# Patient Record
Sex: Female | Born: 1965
Health system: Southern US, Community
[De-identification: ages and names within clinical notes are randomized; demographics above are authoritative.]

## PROBLEM LIST (undated history)

## (undated) DIAGNOSIS — K219 Gastro-esophageal reflux disease without esophagitis: Secondary | ICD-10-CM

## (undated) DIAGNOSIS — N201 Calculus of ureter: Secondary | ICD-10-CM

## (undated) DIAGNOSIS — F1011 Alcohol abuse, in remission: Secondary | ICD-10-CM

## (undated) DIAGNOSIS — J449 Chronic obstructive pulmonary disease, unspecified: Secondary | ICD-10-CM

## (undated) DIAGNOSIS — E210 Primary hyperparathyroidism: Secondary | ICD-10-CM

## (undated) DIAGNOSIS — Z915 Personal history of self-harm: Secondary | ICD-10-CM

## (undated) DIAGNOSIS — F209 Schizophrenia, unspecified: Secondary | ICD-10-CM

## (undated) DIAGNOSIS — K449 Diaphragmatic hernia without obstruction or gangrene: Secondary | ICD-10-CM

## (undated) DIAGNOSIS — F4310 Post-traumatic stress disorder, unspecified: Secondary | ICD-10-CM

## (undated) DIAGNOSIS — F319 Bipolar disorder, unspecified: Secondary | ICD-10-CM

## (undated) DIAGNOSIS — Z87442 Personal history of urinary calculi: Secondary | ICD-10-CM

## (undated) DIAGNOSIS — Z8619 Personal history of other infectious and parasitic diseases: Secondary | ICD-10-CM

## (undated) DIAGNOSIS — L2989 Other pruritus: Secondary | ICD-10-CM

## (undated) DIAGNOSIS — E785 Hyperlipidemia, unspecified: Secondary | ICD-10-CM

## (undated) DIAGNOSIS — E559 Vitamin D deficiency, unspecified: Secondary | ICD-10-CM

## (undated) DIAGNOSIS — F1910 Other psychoactive substance abuse, uncomplicated: Secondary | ICD-10-CM

## (undated) DIAGNOSIS — F32A Depression, unspecified: Secondary | ICD-10-CM

## (undated) DIAGNOSIS — F603 Borderline personality disorder: Secondary | ICD-10-CM

## (undated) DIAGNOSIS — L298 Other pruritus: Secondary | ICD-10-CM

## (undated) DIAGNOSIS — F1911 Other psychoactive substance abuse, in remission: Secondary | ICD-10-CM

## (undated) DIAGNOSIS — Z9151 Personal history of suicidal behavior: Secondary | ICD-10-CM

## (undated) HISTORY — PX: TUBAL LIGATION: SHX77

## (undated) HISTORY — DX: Other psychoactive substance abuse, uncomplicated: F19.10

## (undated) HISTORY — PX: HERNIA REPAIR: SHX51

## (undated) HISTORY — DX: Hyperlipidemia, unspecified: E78.5

---

## 2002-03-13 HISTORY — PX: SPLENECTOMY: SUR1306

## 2011-05-16 ENCOUNTER — Emergency Department (HOSPITAL_COMMUNITY)
Admission: EM | Admit: 2011-05-16 | Discharge: 2011-05-16 | Discharge status: Against Medical Advice | Payer: Self-pay | Attending: Emergency Medicine | Admitting: Emergency Medicine

## 2011-05-16 ENCOUNTER — Encounter (HOSPITAL_COMMUNITY): Payer: Self-pay | Admitting: Family Medicine

## 2011-05-16 DIAGNOSIS — K429 Umbilical hernia without obstruction or gangrene: Secondary | ICD-10-CM | POA: Insufficient documentation

## 2011-05-16 NOTE — ED Notes (Signed)
Per EMS: Pt was getting out of her car approx 1.5 hours ago and peri umbilical hernia "popped" out. Rates pain 10/10.

## 2011-05-16 NOTE — ED Notes (Signed)
Patient came to nurses'station and stated that she was better. States that her hernia went back in by itself. States that she had been laying on the stretcher with her knees bent and after she started to relax, the hernia went back in. States that she no longer needs to see the physician because "I am not in pain any more and the hernia is back in." Encouraged patient to stay for evaluation but patient declined. Told patient to return to ED if needed; patient verbalized understanding.

## 2011-05-16 NOTE — ED Notes (Signed)
ZOX:[id number]<AV> Expected date:05/16/11<BR> Expected time: 6:57 PM<BR> Means of arrival:Ambulance<BR> Comments:<BR> EMS 25 GC- 46 y/o female with abdominal pain. History of unbilical Hernia. Pain in that area. Vitals wnl.

## 2011-06-02 ENCOUNTER — Encounter (HOSPITAL_COMMUNITY): Payer: Self-pay | Admitting: Emergency Medicine

## 2011-06-02 ENCOUNTER — Emergency Department (HOSPITAL_COMMUNITY)
Admission: EM | Admit: 2011-06-02 | Discharge: 2011-06-03 | Disposition: A | Discharge status: Other Acute Inpatient Hospital | Payer: Self-pay | Attending: Emergency Medicine | Admitting: Emergency Medicine

## 2011-06-02 DIAGNOSIS — F1490 Cocaine use, unspecified, uncomplicated: Secondary | ICD-10-CM

## 2011-06-02 DIAGNOSIS — A499 Bacterial infection, unspecified: Secondary | ICD-10-CM | POA: Insufficient documentation

## 2011-06-02 DIAGNOSIS — B9689 Other specified bacterial agents as the cause of diseases classified elsewhere: Secondary | ICD-10-CM | POA: Insufficient documentation

## 2011-06-02 DIAGNOSIS — H11419 Vascular abnormalities of conjunctiva, unspecified eye: Secondary | ICD-10-CM | POA: Insufficient documentation

## 2011-06-02 DIAGNOSIS — Z8619 Personal history of other infectious and parasitic diseases: Secondary | ICD-10-CM | POA: Insufficient documentation

## 2011-06-02 DIAGNOSIS — N760 Acute vaginitis: Secondary | ICD-10-CM | POA: Insufficient documentation

## 2011-06-02 DIAGNOSIS — R4182 Altered mental status, unspecified: Secondary | ICD-10-CM | POA: Insufficient documentation

## 2011-06-02 DIAGNOSIS — F1410 Cocaine abuse, uncomplicated: Secondary | ICD-10-CM | POA: Insufficient documentation

## 2011-06-02 DIAGNOSIS — R404 Transient alteration of awareness: Secondary | ICD-10-CM | POA: Insufficient documentation

## 2011-06-02 DIAGNOSIS — R45851 Suicidal ideations: Secondary | ICD-10-CM | POA: Insufficient documentation

## 2011-06-02 LAB — COMPREHENSIVE METABOLIC PANEL
ALT: 12 U/L (ref 0–35)
Albumin: 3.9 g/dL (ref 3.5–5.2)
Alkaline Phosphatase: 85 U/L (ref 39–117)
BUN: 10 mg/dL (ref 6–23)
Potassium: 3.6 mEq/L (ref 3.5–5.1)
Sodium: 135 mEq/L (ref 135–145)
Total Protein: 7.2 g/dL (ref 6.0–8.3)

## 2011-06-02 LAB — CBC
MCHC: 34.9 g/dL (ref 30.0–36.0)
RDW: 14 % (ref 11.5–15.5)

## 2011-06-02 LAB — ETHANOL: Alcohol, Ethyl (B): <11 mg/dL (ref 0–11)

## 2011-06-02 NOTE — ED Notes (Addendum)
Per Pt and friend: chronic cocaine, marijuana, etoh, heroine user. Presents to ED with feelings of wanting to kill self but denies plan. Poor historian. Restless, cooperative, in no acute distress. Reports itching in vagina and non-compliance with prescription meds for vaginal infection.

## 2011-06-02 NOTE — ED Notes (Signed)
Pt has 2 bags.  One of clothing and the other a purse.

## 2011-06-03 ENCOUNTER — Inpatient Hospital Stay (HOSPITAL_COMMUNITY)
Admission: RE | Admit: 2011-06-03 | Discharge: 2011-06-07 | DRG: 897 | Disposition: A | Discharge status: Home / Self Care | Payer: PRIVATE HEALTH INSURANCE | Source: Ambulatory Visit | Attending: Psychiatry | Admitting: Psychiatry

## 2011-06-03 DIAGNOSIS — F1420 Cocaine dependence, uncomplicated: Principal | ICD-10-CM

## 2011-06-03 DIAGNOSIS — F17200 Nicotine dependence, unspecified, uncomplicated: Secondary | ICD-10-CM

## 2011-06-03 DIAGNOSIS — A499 Bacterial infection, unspecified: Secondary | ICD-10-CM

## 2011-06-03 DIAGNOSIS — R45851 Suicidal ideations: Secondary | ICD-10-CM

## 2011-06-03 DIAGNOSIS — N760 Acute vaginitis: Secondary | ICD-10-CM

## 2011-06-03 DIAGNOSIS — Z79899 Other long term (current) drug therapy: Secondary | ICD-10-CM

## 2011-06-03 DIAGNOSIS — B9689 Other specified bacterial agents as the cause of diseases classified elsewhere: Secondary | ICD-10-CM | POA: Diagnosis present

## 2011-06-03 DIAGNOSIS — B1920 Unspecified viral hepatitis C without hepatic coma: Secondary | ICD-10-CM

## 2011-06-03 DIAGNOSIS — F1910 Other psychoactive substance abuse, uncomplicated: Secondary | ICD-10-CM

## 2011-06-03 DIAGNOSIS — F1220 Cannabis dependence, uncomplicated: Secondary | ICD-10-CM

## 2011-06-03 DIAGNOSIS — F609 Personality disorder, unspecified: Secondary | ICD-10-CM

## 2011-06-03 DIAGNOSIS — F39 Unspecified mood [affective] disorder: Secondary | ICD-10-CM

## 2011-06-03 DIAGNOSIS — F1994 Other psychoactive substance use, unspecified with psychoactive substance-induced mood disorder: Secondary | ICD-10-CM

## 2011-06-03 LAB — WET PREP, GENITAL: Trich, Wet Prep: NONE SEEN

## 2011-06-03 LAB — URINALYSIS, ROUTINE W REFLEX MICROSCOPIC
Hgb urine dipstick: NEGATIVE
Ketones, ur: 15 mg/dL — AB
Nitrite: NEGATIVE
pH: 6 (ref 5.0–8.0)

## 2011-06-03 LAB — RAPID URINE DRUG SCREEN, HOSP PERFORMED
Barbiturates: NOT DETECTED
Benzodiazepines: NOT DETECTED
Cocaine: POSITIVE — AB
Opiates: NOT DETECTED

## 2011-06-03 MED ORDER — ONDANSETRON HCL 4 MG PO TABS: 4.0000 mg | ORAL_TABLET | Freq: Three times a day (TID) | ORAL | Status: DC | PRN

## 2011-06-03 MED ORDER — NICOTINE 21 MG/24HR TD PT24
21.0000 mg | MEDICATED_PATCH | Freq: Every day | TRANSDERMAL | Status: DC
  Filled 2011-06-03 (×5): qty 1

## 2011-06-03 MED ORDER — METRONIDAZOLE 500 MG PO TABS
500.0000 mg | ORAL_TABLET | Freq: Once | ORAL | Status: AC
  Administered 2011-06-03: 500 mg via ORAL
  Filled 2011-06-03: qty 1

## 2011-06-03 MED ORDER — MICONAZOLE NITRATE 2 % VA CREA
1.0000 | TOPICAL_CREAM | Freq: Every day | VAGINAL | Status: DC
  Administered 2011-06-03 – 2011-06-04 (×2): 1 via VAGINAL
  Filled 2011-06-03 (×3): qty 45

## 2011-06-03 MED ORDER — ACETAMINOPHEN 325 MG PO TABS: 650.0000 mg | ORAL_TABLET | ORAL | Status: DC | PRN

## 2011-06-03 MED ORDER — TRAZODONE HCL 50 MG PO TABS
50.0000 mg | ORAL_TABLET | Freq: Once | ORAL | Status: DC
  Filled 2011-06-03 (×2): qty 1

## 2011-06-03 MED ORDER — LORAZEPAM 1 MG PO TABS
1.0000 mg | ORAL_TABLET | Freq: Once | ORAL | Status: DC
  Filled 2011-06-03 (×2): qty 1

## 2011-06-03 MED ORDER — ACETAMINOPHEN 325 MG PO TABS
650.0000 mg | ORAL_TABLET | Freq: Four times a day (QID) | ORAL | Status: DC | PRN
  Administered 2011-06-06: 650 mg via ORAL

## 2011-06-03 MED ORDER — LORAZEPAM 1 MG PO TABS: 1.0000 mg | ORAL_TABLET | Freq: Three times a day (TID) | ORAL | Status: DC | PRN

## 2011-06-03 MED ORDER — ALUM & MAG HYDROXIDE-SIMETH 200-200-20 MG/5ML PO SUSP: 30.0000 mL | ORAL | Status: DC | PRN

## 2011-06-03 MED ORDER — DIPHENHYDRAMINE HCL 50 MG PO CAPS
50.0000 mg | ORAL_CAPSULE | Freq: Once | ORAL | Status: AC
  Administered 2011-06-04: 50 mg via ORAL
  Filled 2011-06-03: qty 1

## 2011-06-03 MED ORDER — HALOPERIDOL 2 MG PO TABS
2.0000 mg | ORAL_TABLET | Freq: Once | ORAL | Status: DC
  Filled 2011-06-03: qty 2
  Filled 2011-06-03: qty 1

## 2011-06-03 MED ORDER — MAGNESIUM HYDROXIDE 400 MG/5ML PO SUSP: 30.0000 mL | Freq: Every day | ORAL | Status: DC | PRN

## 2011-06-03 MED ORDER — ALUM & MAG HYDROXIDE-SIMETH 200-200-20 MG/5ML PO SUSP
30.0000 mL | ORAL | Status: DC | PRN
Start: 2011-06-03 — End: 2011-06-03
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
## 2011-06-03 NOTE — ED Notes (Signed)
Pt accepted to [facility] 303-1. Report called to [place], RN at [facility]. Accepting MD Mazzei. Pt informed. Pt is pleasant, cooperative and in no distress. NAD noted.

## 2011-06-03 NOTE — ED Notes (Signed)
Received report

## 2011-06-03 NOTE — BH Assessment (Signed)
Assessment Note   Albesa Seen, LSCW requested Pt be reviewed by MD on call for admission. Relayed clinical information to Verne Spurr, PA who accepted Pt to [facility]. Consulted with Binnie Rail, [facility] who confirmed bed availability: 303-1. Communicated information to Georgina Quint, Ecologist at [employer].   Patsy Baltimore, Harlin Rain 06/03/2011 8:28 PM

## 2011-06-03 NOTE — ED Provider Notes (Addendum)
History     CSN: [id number]  Arrival date & time 06/02/11  2126   First MD Initiated Contact with Patient 06/03/11 0250      Chief Complaint  Patient presents with  . Suicidal    (Consider location/radiation/quality/duration/timing/severity/associated sxs/prior treatment) HPI Level 5 Caveat: altered mental status The patient is somnolent and cannot stay awake long enough to answer a single question.  Patient will be reevaluated when she's able to converse.  Past Medical History  Diagnosis Date  . Hernia   . Hepatitis C   . Drug abuse and dependence     Past Surgical History  Procedure Date  . Splenectomy   . Tubal ligation     History reviewed. No pertinent family history.  History  Substance Use Topics  . Smoking status: Current Everyday Smoker -- 1.0 packs/day  . Smokeless tobacco: Not on file  . Alcohol Use: No    OB History    Grav Para Term Preterm Abortions TAB SAB Ect Mult Living                  Review of Systems  All other systems reviewed and are negative.    Allergies  Review of patient's allergies indicates no known allergies.  Home Medications   Current Outpatient Rx  Name Route Sig Dispense Refill  . METRONIDAZOLE 500 MG PO TABS Oral Take 500 mg by mouth 2 (two) times daily.      BP 114/55  Pulse 84  Temp(Src) 98.3 F (36.8 C) (Oral)  Resp 20  SpO2 95%  LMP 05/05/2011  Physical Exam General: Well-developed, well-nourished female in no acute distress; appearance consistent with age of record HENT: normocephalic, atraumatic Eyes: Conjunctiva; injection; pupils equal round and reactive to light Neck: supple Heart: regular rate and rhythm Lungs: clear to auscultation bilaterally Abdomen: soft; nondistended Extremities: No deformity Neurologic: Somnolent; motor function intact in all extremities and symmetric; no facial droop Skin: Warm and dry Psychiatric: Unable to perform exam at this time    ED Course  Procedures  (including critical care time)     MDM   Nursing notes and vitals signs, including pulse oximetry, reviewed.  Summary of this visit's results, reviewed by myself:  Labs:  Results for orders placed during the hospital encounter of 06/02/11  CBC      Component Value Range   WBC 10.5  4.0 - 10.5 (K/uL)   RBC 3.98  3.87 - 5.11 (MIL/uL)   Hemoglobin 12.5  12.0 - 15.0 (g/dL)   HCT 64.4 (*) 03.4 - 46.0 (%)   MCV 89.9  78.0 - 100.0 (fL)   MCH 31.4  26.0 - 34.0 (pg)   MCHC 34.9  30.0 - 36.0 (g/dL)   RDW 74.2  59.5 - 63.8 (%)   Platelets 364  150 - 400 (K/uL)  COMPREHENSIVE METABOLIC PANEL      Component Value Range   Sodium 135  135 - 145 (mEq/L)   Potassium 3.6  3.5 - 5.1 (mEq/L)   Chloride 102  96 - 112 (mEq/L)   CO2 26  19 - 32 (mEq/L)   Glucose, Bld 93  70 - 99 (mg/dL)   BUN 10  6 - 23 (mg/dL)   Creatinine, Ser 7.56  0.50 - 1.10 (mg/dL)   Calcium 43.3  8.4 - 10.5 (mg/dL)   Total Protein 7.2  6.0 - 8.3 (g/dL)   Albumin 3.9  3.5 - 5.2 (g/dL)   AST 16  0 - 37 (U/L)  ALT 12  0 - 35 (U/L)   Alkaline Phosphatase 85  39 - 117 (U/L)   Total Bilirubin 0.4  0.3 - 1.2 (mg/dL)   GFR calc non Af Amer >90  >90 (mL/min)   GFR calc Af Amer >90  >90 (mL/min)  ETHANOL      Component Value Range   Alcohol, Ethyl (B) <11  0 - 11 (mg/dL)  ACETAMINOPHEN LEVEL      Component Value Range   Acetaminophen (Tylenol), Serum <15.0  10 - 30 (ug/mL)  URINE RAPID DRUG SCREEN (HOSP PERFORMED)      Component Value Range   Opiates NONE DETECTED  NONE DETECTED    Cocaine POSITIVE (*) NONE DETECTED    Benzodiazepines NONE DETECTED  NONE DETECTED    Amphetamines NONE DETECTED  NONE DETECTED    Tetrahydrocannabinol POSITIVE (*) NONE DETECTED    Barbiturates NONE DETECTED  NONE DETECTED   URINALYSIS, ROUTINE W REFLEX MICROSCOPIC      Component Value Range   Color, Urine YELLOW  YELLOW    APPearance CLEAR  CLEAR    Specific Gravity, Urine 1.036 (*) 1.005 - 1.030    pH 6.0  5.0 - 8.0    Glucose, UA  NEGATIVE  NEGATIVE (mg/dL)   Hgb urine dipstick NEGATIVE  NEGATIVE    Bilirubin Urine SMALL (*) NEGATIVE    Ketones, ur 15 (*) NEGATIVE (mg/dL)   Protein, ur NEGATIVE  NEGATIVE (mg/dL)   Urobilinogen, UA 1.0  0.0 - 1.0 (mg/dL)   Nitrite NEGATIVE  NEGATIVE    Leukocytes, UA NEGATIVE  NEGATIVE   POCT PREGNANCY, URINE      Component Value Range   Preg Test, Ur NEGATIVE  NEGATIVE    7:17 AM The patient is now awake and able to give a history. She states that she's had a 30 year history of crack cocaine abuse. She just did a four-day binge of crack cocaine, hence her somnolence earlier. She is tearful and admits to suicidal ideation. She also complains of a vaginal discharge; she was recently treated for this but did not complete her course of Flagyl.  PE: GU: Normal external genitalia; GERD-like vaginal discharge; no vaginal bleeding; no cervical motion tenderness; no adnexal tenderness; no adnexal mass.  7:30 AM The patient will be evaluated by ACT. Dr. Karma Ganja will follow up on wet prep, the results of which are pending.          Hanley Seamen, MD 06/03/11 1610  Hanley Seamen, MD 06/03/11 [phone]

## 2011-06-03 NOTE — ED Notes (Addendum)
Pt. Resting on stretcher, pt. arousable  but very sleepy

## 2011-06-03 NOTE — ED Notes (Signed)
Pt has been accepted to [facility] by Shelda Jakes, PA to attending Dr Koren Shiver, rm 303-1. EDP was notified and agrees with plan. Pt has been notified and support paperwork has been gathered and faxed to [facility].

## 2011-06-03 NOTE — ED Notes (Signed)
Patient is resting comfortably. 

## 2011-06-03 NOTE — ED Provider Notes (Signed)
Pt currently awaiting assessment by ACT team, wet prep obtained by Dr. Read Drivers show many clue cells- flagyl ordered for BV.    Ethelda Chick, MD 06/03/11 [phone]

## 2011-06-03 NOTE — ED Notes (Signed)
2 bags of belongings in locker 26.

## 2011-06-03 NOTE — ED Notes (Signed)
Pt asleep, breakfast tray ordered.

## 2011-06-03 NOTE — ED Notes (Signed)
Report called to Wendy, RN

## 2011-06-03 NOTE — ED Notes (Signed)
Pt has been accepted to [facility] 303-1. Report called to [facility] RN at [facility]. Receiving MD will be [facility]. Pt is in no distress at this time, VS stable. Awaitng disposition from Dr. Adriana Simas to d/c pt to [facility].

## 2011-06-04 DIAGNOSIS — F1910 Other psychoactive substance abuse, uncomplicated: Secondary | ICD-10-CM

## 2011-06-04 DIAGNOSIS — A499 Bacterial infection, unspecified: Secondary | ICD-10-CM

## 2011-06-04 DIAGNOSIS — N760 Acute vaginitis: Secondary | ICD-10-CM

## 2011-06-04 DIAGNOSIS — B9689 Other specified bacterial agents as the cause of diseases classified elsewhere: Secondary | ICD-10-CM | POA: Diagnosis present

## 2011-06-04 LAB — URINALYSIS, ROUTINE W REFLEX MICROSCOPIC
Bilirubin Urine: NEGATIVE
Hgb urine dipstick: NEGATIVE
Ketones, ur: NEGATIVE mg/dL
Protein, ur: NEGATIVE mg/dL
Urobilinogen, UA: 0.2 mg/dL (ref 0.0–1.0)

## 2011-06-04 MED ORDER — METRONIDAZOLE 500 MG PO TABS
500.0000 mg | ORAL_TABLET | Freq: Two times a day (BID) | ORAL | Status: DC
  Administered 2011-06-04 – 2011-06-07 (×7): 500 mg via ORAL
  Filled 2011-06-04 (×10): qty 1

## 2011-06-04 NOTE — Progress Notes (Signed)
Patient ID: Mary [name], female   DOB: 1965/04/07, 46 y.o.   MRN: [id number]  [facility] Notes:  (Counselor/Nursing/MHT/Case Management/Adjunct)  06/04/2011 1:15 PM  Type of Therapy:  Group Therapy, Dance/Movement Therapy   Participation Level:  Did Not Attend   [place], [place]

## 2011-06-04 NOTE — Progress Notes (Signed)
[facility] Adult Inpatient Family/Significant Other Suicide Prevention Education  Suicide Prevention Education:  Contact Attempts: Providence Crosby (daughter) [phone], (name of family member/significant other) has been identified by the patient as the family member/significant other with whom the patient will be residing, and identified as the person(s) who will aid the patient in the event of a mental health crisis.  With written consent from the patient, two attempts were made to provide suicide prevention education, prior to and/or following the patient's discharge.  We were unsuccessful in providing suicide prevention education.  A suicide education pamphlet was given to the patient to share with family/significant other.  Date and time of first attempt: 06/04/11 at 2:48 PM was not able to leave voice message Date and time of second attempt:  [facility] 06/04/2011, 2:48 PM

## 2011-06-04 NOTE — H&P (Signed)
Patient was seen for suicidal assessment and case discussed with Ms. Adams and agree with admission and treatment plan.
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
## 2011-06-04 NOTE — H&P (Signed)
Psychiatric Admission Assessment Adult  Patient Identification:  Mary [name] Date of Evaluation:  06/04/2011 46 yo SAAF CC: Suicidal  History of Present Illness:: Presented to ED after a 4 day binge of crack cocaine- has abused for 30 years.She was tearful and reported SI with a plan to walk into traffic.She had hallucinations when high.   Past Psychiatric History: Says last place she received treatment was [facility]  Prozac Saphris and Zyprexa help her stay clean.   Substance Abuse History:  Social History:    reports that she has been smoking.  She does not have any smokeless tobacco history on file. She reports that she uses illicit drugs (Cocaine). She reports that she does not drink alcohol.  Family Psych History: Denies Mental illness   Past Medical History:     Past Medical History  Diagnosis Date  . Hernia   . Hepatitis C   . Drug abuse and dependence        Past Surgical History  Procedure Date  . Splenectomy   . Tubal ligation     Allergies: No Known Allergies  Current Medications:  Prior to Admission medications   Medication Sig Start Date End Date Taking? Authorizing Provider  metroNIDAZOLE (FLAGYL) 500 MG tablet Take 500 mg by mouth 2 (two) times daily.    Historical Provider, MD    Mental Status Examination/Evaluation: Objective:  Appearance: Disheveled  Psychomotor Activity:  Normal  Eye Contact::  Good  Speech:  Normal Rate  Volume:  Normal  Mood: concerned about her health    Affect:  Congruent  Thought Process: clear rational goal oriented - get off crack     Orientation:  Full  Thought Content:  No AVH or psychosis   Suicidal Thoughts:  No  Homicidal Thoughts:  No  Judgement:  Fair  Insight:  Fair    DIAGNOSIS:    AXIS I Substance Induced Mood Disorder  AXIS II Deferred  AXIS III See medical history.  AXIS IV economic problems, housing problems, occupational problems, problems related to social environment and  problems with primary support group  AXIS V 31-40 impairment in reality testing when high      Treatment Plan Summary: Admit for safety & stabilization Support with medication as indicated for detox from crack. Treat BV and r/o STD's Get med history from [facility] and initiate treatment as indicated.   Agree with H&P from ED

## 2011-06-04 NOTE — Progress Notes (Signed)
Patient ID: Mary [name], female   DOB: May 19, 1965, 46 y.o.   MRN: [id number] Pt. denies lethality and A/H's, but admits to "seeing these black spots in front of my eyes: but I'm not crazy."  Pt. Has an inappropriate laugh and smiles constantly with a flirtatious affect especially in front of peers. No c/o pain or problems except those related to her mouth rash (she is on flagyl for her yeast infection and uses a cream at HS). Pt. remains on the COWS protocal: no tremors or other obvious problems noted.

## 2011-06-04 NOTE — Tx Team (Signed)
Initial Interdisciplinary Treatment Plan  PATIENT STRENGTHS: (choose at least two) Average or above average intelligence  PATIENT STRESSORS: Financial difficulties Medication change or noncompliance Substance abuse   PROBLEM LIST: Problem List/Patient Goals Date to be addressed Date deferred Reason deferred Estimated date of resolution  Substance Abuse 06/03/11     Suicidal 06/03/11                                                DISCHARGE CRITERIA:  Ability to meet basic life and health needs Improved stabilization in mood, thinking, and/or behavior Verbal commitment to aftercare and medication compliance  PRELIMINARY DISCHARGE PLAN: Attend 12-step recovery group  PATIENT/FAMIILY INVOLVEMENT: This treatment plan has been presented to and reviewed with the patient, Cris Gibby  .  Binnie Rail [facility] 06/04/2011, 2:16 AM
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
## 2011-06-04 NOTE — Progress Notes (Signed)
Patient ID: Mary [name], female   DOB: 1965-12-25, 46 y.o.   MRN: [id number] Pt is asleep in bed this AM. Pt denies SI/HI and AVH. Pt c/o withdrawal symptoms and discomfort r/t detox. Pt states that she cannot participate due to her symptoms. Pt is also c/o vaginal discomfort. PA notified. Pt is withdrawn and forwards little. Writer will continue to monitor.
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
## 2011-06-05 LAB — RPR: RPR Ser Ql: NONREACTIVE

## 2011-06-05 LAB — GC/CHLAMYDIA PROBE AMP, GENITAL
Chlamydia, DNA Probe: NEGATIVE
GC Probe Amp, Genital: NEGATIVE

## 2011-06-05 MED ORDER — DIPHENHYDRAMINE HCL 25 MG PO CAPS
50.0000 mg | ORAL_CAPSULE | Freq: Four times a day (QID) | ORAL | Status: DC | PRN
  Administered 2011-06-05 – 2011-06-06 (×4): 50 mg via ORAL
  Filled 2011-06-05: qty 2

## 2011-06-05 MED ORDER — DIPHENHYDRAMINE-ZINC ACETATE 2-0.1 % EX CREA
TOPICAL_CREAM | Freq: Every day | CUTANEOUS | Status: DC | PRN
  Administered 2011-06-05 – 2011-06-06 (×3): via TOPICAL
  Filled 2011-06-05: qty 28.4

## 2011-06-05 MED ORDER — DIPHENHYDRAMINE HCL 50 MG/ML IJ SOLN: 50.0000 mg | Freq: Four times a day (QID) | INTRAMUSCULAR | Status: DC | PRN

## 2011-06-05 MED ORDER — TUBERCULIN PPD 5 UNIT/0.1ML ID SOLN
5.0000 [IU] | Freq: Once | INTRADERMAL | Status: AC
  Administered 2011-06-06: 5 [IU] via INTRADERMAL

## 2011-06-05 NOTE — Discharge Planning (Signed)
Mary [name] did not come to AM group.  Found her in bed this afternoon.  She was awake and engageable.  States she wants to go into a half-way house from here as she cannot return to stay with her daughter.  Revealed she has no income, and has no confidence about working.  Informed her of [facility] program in [place]  She was willing to fill out application.  I left it with her.  Stated she was at [facility] for rehab in the distant past.

## 2011-06-05 NOTE — Progress Notes (Addendum)
[facility] Group Notes:  (Counselor/Nursing/MHT/Case Management/Adjunct)  06/05/2011 3:36 PM   Type of Therapy:  Processing Group at 11:00 am  Participation Level:  Minimal  Participation Quality:  Attendance  Affect:  Distracted and preoccupied  Cognitive:  Unknown  Insight: Unknown  Engagement in Group:  Minimal  Engagement in Therapy:  Minimal  Modes of Intervention:  Socialization and Support  Summary of Progress/Problems:  Mary [name] came in for about one third of group processing session;.did not share when prompted and left before 15 minutes passed.  Patient may have been responding to internal stimuli and appeared to f;irt with facilitator and one other person in group  [facility] Group Notes:  (Counselor/Nursing/MHT/Case Management/Adjunct)  06/05/2011 3:36 PM   Type of Therapy:  Counseling Group at 1:15 pm  Participation Level:  Did Not Attend   Ronda Fairly, LCSWA 06/05/2011 3:36 PM

## 2011-06-05 NOTE — Progress Notes (Signed)
[facility] MD Progress Note  06/05/2011 4:36 PM  Diagnosis:  Substance abuse  ADL's:  Intact  Sleep: Poor  Appetite:  Poor  Suicidal Ideation:  denies Homicidal Ideation:  denies  AEB (as evidenced by):  Mental Status Examination/Evaluation: Objective:  Appearance: Disheveled  Eye Contact::  Fair  Speech:  Clear and Coherent  Volume:  Normal  Mood:  Euthymic  Affect:  Appropriate  Thought Process:  Linear  Orientation:  Full  Thought Content:  WDL  Suicidal Thoughts:  No  Homicidal Thoughts:  No  Memory:  Immediate;   Fair  Judgement:  Fair  Insight:  Lacking  Psychomotor Activity:  Normal  Concentration:  Fair  Recall:  Fair  Akathisia:  No  Handed:    AIMS (if indicated):     Assets:  Desire for Improvement  Sleep:  Number of Hours: 5.5    Vital Signs:Blood pressure 117/79, pulse 70, temperature 97.6 F (36.4 C), temperature source Oral, resp. rate 17, height 5\' 4"  (1.626 m), weight 68.04 kg (150 lb), last menstrual period 05/05/2011. Current Medications: Current Facility-Administered Medications  Medication Dose Route Frequency Provider Last Rate Last Dose  . acetaminophen (TYLENOL) tablet 650 mg  650 mg Oral Q6H PRN Verne Spurr, PA-C      . alum & mag hydroxide-simeth (MAALOX/MYLANTA) 200-200-20 MG/5ML suspension 30 mL  30 mL Oral Q4H PRN Verne Spurr, PA-C      . diphenhydrAMINE (BENADRYL) capsule 50 mg  50 mg Oral Once Verne Spurr, PA-C   50 mg at 06/04/11 2252  . diphenhydrAMINE (BENADRYL) capsule 50 mg  50 mg Oral Q6H PRN Verne Spurr, PA-C   50 mg at 06/05/11 1121   Or  . diphenhydrAMINE (BENADRYL) injection 50 mg  50 mg Intramuscular Q6H PRN Verne Spurr, PA-C      . diphenhydrAMINE-zinc acetate (BENADRYL) 2-0.1 % cream   Topical Daily PRN Verne Spurr, PA-C      . haloperidol (HALDOL) tablet 2 mg  2 mg Oral Once [employer], PA-C      . LORazepam (ATIVAN) tablet 1 mg  1 mg Oral Once [employer], PA-C      . magnesium hydroxide (MILK OF MAGNESIA)  suspension 30 mL  30 mL Oral Daily PRN Verne Spurr, PA-C      . metroNIDAZOLE (FLAGYL) tablet 500 mg  500 mg Oral BID [name] Adams, PA   500 mg at 06/05/11 0803  . miconazole (MONISTAT 7) 2 % vaginal cream 1 Applicatorful  1 Applicatorful Vaginal QHS Verne Spurr, PA-C   1 Applicatorful at 06/04/11 2200  . nicotine (NICODERM CQ - dosed in mg/24 hours) patch 21 mg  21 mg Transdermal Q0600 Verne Spurr, PA-C      . ondansetron [facility]) tablet 4 mg  4 mg Oral Q8H PRN Verne Spurr, PA-C      . traZODone (DESYREL) tablet 50 mg  50 mg Oral Once Verne Spurr, PA-C        Lab Results:  Results for orders placed during the hospital encounter of 06/03/11 (from the past 48 hour(s))  URINALYSIS, ROUTINE W REFLEX MICROSCOPIC     Status: Abnormal   Collection Time   06/04/11  7:00 PM      Component Value Range Comment   Color, Urine YELLOW  YELLOW     APPearance CLOUDY (*) CLEAR     Specific Gravity, Urine 1.018  1.005 - 1.030     pH 7.5  5.0 - 8.0     Glucose,  UA NEGATIVE  NEGATIVE (mg/dL)    Hgb urine dipstick NEGATIVE  NEGATIVE     Bilirubin Urine NEGATIVE  NEGATIVE     Ketones, ur NEGATIVE  NEGATIVE (mg/dL)    Protein, ur NEGATIVE  NEGATIVE (mg/dL)    Urobilinogen, UA 0.2  0.0 - 1.0 (mg/dL)    Nitrite NEGATIVE  NEGATIVE     Leukocytes, UA SMALL (*) NEGATIVE    URINE MICROSCOPIC-ADD ON     Status: Abnormal   Collection Time   06/04/11  7:00 PM      Component Value Range Comment   Squamous Epithelial / LPF FEW (*) RARE     WBC, UA 0-2  <3 (WBC/hpf)    Bacteria, UA FEW (*) RARE    RPR     Status: Normal   Collection Time   06/04/11  7:37 PM      Component Value Range Comment   RPR NON REACTIVE  NON REACTIVE      Physical Findings: AIMS:  , ,  ,  ,    CIWA:    COWS:     Treatment Plan Summary: Daily contact with patient to assess and evaluate symptoms and progress in treatment Medication management  Plan:  Mary [name] 06/05/2011, 4:36 PM

## 2011-06-05 NOTE — Progress Notes (Signed)
Patient ID: Mary [name], female   DOB: December 27, 1965, 46 y.o.   MRN: [id number]  Patient was pleasant and cooperative during the assessment, but anxious. Stated that she didn't feel well, and her skin continues to itch.  Pt relates this to crack use. Writer encouraged pt to take a bath tonight and apply cream to her body. Pt seemed slightly agitated, because staff had to interrupt her in order for them to do environmental. Support and encouragement was offered.

## 2011-06-05 NOTE — Progress Notes (Signed)
Patient ID: Mary [name], female   DOB: 09-Oct-1965, 46 y.o.   MRN: [id number] Pt. denies lethality and A/V/H's, but continues to have problems with her anxiety.  Pt. also showed staff and N. Mashburn, PA her Rt. Arm AC with a large red circular area.  Pt. was started on Benadryl. Benadryl cream applied when available. Pt. is not interacting with nursing staff, but did interact in groups.

## 2011-06-06 MED ORDER — OLANZAPINE 5 MG PO TABS
5.0000 mg | ORAL_TABLET | Freq: Every day | ORAL | Status: DC
  Administered 2011-06-06: 5 mg via ORAL
  Filled 2011-06-06 (×3): qty 1

## 2011-06-06 MED ORDER — FLUOXETINE HCL 20 MG PO CAPS
20.0000 mg | ORAL_CAPSULE | Freq: Every day | ORAL | Status: DC
  Administered 2011-06-06 – 2011-06-07 (×2): 20 mg via ORAL
  Filled 2011-06-06 (×4): qty 1

## 2011-06-06 NOTE — Progress Notes (Signed)
[facility] MD Progress Note  06/06/2011 3:51 PM  S/O: Patient seen and evaluated. Chart reviewed. Patient stated that her mood was "ok". Her affect was mood congruent, yet anxious. She denied any current thoughts of self injurious behavior, suicidal ideation or homicidal ideation. There were no auditory or visual hallucinations, paranoia, delusional thought processes, or true mania noted.  Thought process was linear and goal directed.  No psychomotor agitation or retardation was noted. Speech was normal rate, tone and volume. Eye contact was good. Judgment and insight are fair.  Patient has been up and engaged on the unit.  No acute safety concerns reported from team.  Discharge to [facility] scheduled for tomorrow, yet changed meds today and pt to be seen by MD for SRA prior to final discharge in am.   Sleep:  Number of Hours: 6.75    Vital Signs:Blood pressure 101/70, pulse 78, temperature 97.7 F (36.5 C), temperature source Oral, resp. rate 19, height 5\' 4"  (1.626 m), weight 68.04 kg (150 lb), last menstrual period 05/05/2011.  Lab Results:  Results for orders placed during the hospital encounter of 06/03/11 (from the past 48 hour(s))  URINALYSIS, ROUTINE W REFLEX MICROSCOPIC     Status: Abnormal   Collection Time   06/04/11  7:00 PM      Component Value Range Comment   Color, Urine YELLOW  YELLOW     APPearance CLOUDY (*) CLEAR     Specific Gravity, Urine 1.018  1.005 - 1.030     pH 7.5  5.0 - 8.0     Glucose, UA NEGATIVE  NEGATIVE (mg/dL)    Hgb urine dipstick NEGATIVE  NEGATIVE     Bilirubin Urine NEGATIVE  NEGATIVE     Ketones, ur NEGATIVE  NEGATIVE (mg/dL)    Protein, ur NEGATIVE  NEGATIVE (mg/dL)    Urobilinogen, UA 0.2  0.0 - 1.0 (mg/dL)    Nitrite NEGATIVE  NEGATIVE     Leukocytes, UA SMALL (*) NEGATIVE    URINE MICROSCOPIC-ADD ON     Status: Abnormal   Collection Time   06/04/11  7:00 PM      Component Value Range Comment   Squamous Epithelial / LPF FEW (*) RARE     WBC, UA 0-2  <3  (WBC/hpf)    Bacteria, UA FEW (*) RARE    RPR     Status: Normal   Collection Time   06/04/11  7:37 PM      Component Value Range Comment   RPR NON REACTIVE  NON REACTIVE      A/P: Cocaine and Cannabis Dependence; Mood Disorder NOS; r/o SIMD; r/o PD NOS with Cluster B Traits  Pt stated that she did best in the past via [facility] with Prozac and Zyprexa.  Asked to d/c Trazodone and Haldol 2/2 increased sedation and "reverse effects".  Pt c/o continued irritability, mood lability, anxiety and difficulty sleeping.  Medication education completed.  Pros, cons, risks, potential side effects and benefits (including no treatment) were discussed with pt.  Pt agreeable with the plan.  See orders.  Discussed with team.  Discharge to [facility] tomorrow pending no adverse rxn to restart of meds per pt request.  Lupe Carney 06/06/2011, 3:51 PM

## 2011-06-06 NOTE — Progress Notes (Signed)
Lying quietly in bed with eyes closed.  No signs of acute withdrawal symptoms.  Q 15 minute safety checks are in progress to maintain safety.

## 2011-06-06 NOTE — Progress Notes (Addendum)
[facility] Adult Inpatient Family/Significant Other Suicide Prevention Education  Suicide Prevention Education:  Family/Significant Other Refusal to Support Patient after Discharge:  Suicide Prevention Education Not Provided:  Patient plans to discharge tomorrow 06/07/11 to [facility] treatment center.  With written consent of the patient, two attempts were made to provide Suicide Prevention Education to daughter, Clarise Cruz at [phone].  This person indicates he/she will not be responsible for the patient after discharge.  Writer provided suicide prevention education directly to patient; conversation included risk factors, warning signs and resources to contact for help. Mobile crisis services explained and Suicide prevention information pamphlet with all contact numbers placed in chart for pt to receive at discharge.   Clide Dales 06/06/2011,6:06 PM

## 2011-06-06 NOTE — Progress Notes (Signed)
Patient very restless and complaining of menstrual cramps and itching all over this morning.  She had difficulty participating in groups this morning due to irritability and restlessness.  Patient rates depression at a 7 and states on her self-inventory that she has "off and on" thoughts of suicide without any plan, but does contract for safety.  Tylenol and Benadryl (po and cream) given with good relief of itching and cramps.  Patient stated that she felt "much better" this afternoon and was up and interacting with staff and other patients.  PPD placed on right forearm at 0856 this morning, will need to be read 06/08/11 at 0856.  Patient plans to discharge to [facility] once stabilized.

## 2011-06-06 NOTE — Treatment Plan (Signed)
Interdisciplinary Treatment Plan Update (Adult)  Date: 06/06/2011  Time Reviewed: 8:17 AM   Progress in Treatment: Attending groups: Yes Participating in groups: Yes Taking medication as prescribed: Yes Tolerating medication: Yes   Family/Significant othe contact made: Yes   Patient understands diagnosis:  Yes  As evidenced by asking for help with substance abuse Discussing patient identified problems/goals with staff:  Yes  See below Medical problems stabilized or resolved:  Yes Denies suicidal/homicidal ideation: Yes  With Dr and CM Issues/concerns per patient self-inventory:  None noted Other:  New problem(s) identified: N/A  Reason for Continuation of Hospitalization: Anxiety Depression  Interventions implemented related to continuation of hospitalization:   Additional comments: Application has been sent to [facility] and [name] has a phone interview this afternoon.  If that goes well, she will likely d/c tomorrow  Estimated length of stay: 1-2 days  Discharge Plan: Transfer to [facility] program  New goal(s): N/A  Review of initial/current patient goals per problem list:   1.  Goal(s): Eliminate SI  Met:  Yes  Target date:3/26  As evidenced ZO:XWRU report in mtg with CM, Dr  2.  Goal (s):Identify comprehensive sobriety plan  Met:  No  Target date: 3/27  As evidenced EA:VWUJ report  3.  Goal(s):Address itching  Met:  No  Target date:3/27  As evidenced WJ:[id number] gone  Right now [name] is getting benadryl and cream..  She dislikes benedryl as it makes her drowsy.  4.  Goal(s):  Met:  No  Target date:  As evidenced by:  Attendees: Patient:  Mary [name] 06/06/2011 8:17 AM  Family:     Physician:  Lupe Carney 06/06/2011 8:17 AM   Nursing:    06/06/2011 8:17 AM   Case Manager:  Richelle Ito, LCSW 06/06/2011 8:17 AM   Counselor:   06/06/2011 8:17 AM   Other:   06/06/2011 8:17 AM  Other:     Other:     Other:      Scribe for Treatment Team:   Ida Rogue, 06/06/2011 8:17 AM

## 2011-06-06 NOTE — Progress Notes (Signed)
Patient ID: Mary [name], female   DOB: 1965-05-24, 46 y.o.   MRN: [id number]  Patient was pleasant and cooperative during the assessment. Informed the writer that she would be discharge tomorrow to [facility]. "I've been trying to get into that place for a year".  Stated that the program finds apartments, and pays their bills for a year allowing them time to get a job. Support and encouragement was offered.

## 2011-06-06 NOTE — Progress Notes (Signed)
[facility] Case Management Discharge Plan:  Will you be returning to the same living situation after discharge: No. At discharge, do you have transportation home?:Yes,  [facility] Do you have the ability to pay for your medications: Yes, No meds  Interagency Information:     Release of information consent forms completed and in the chart;  Patient's signature needed at discharge.  Patient to Follow up at:  Follow-up Information    Follow up with [facility] on 06/07/2011. (Pt will be discharginf to the [facility] program from the hospital on 06/07/11.)    Contact information:   [address] [place] [address] [phone]         Patient denies SI/HI:   Yes,  yes    Safety Planning and Suicide Prevention discussed:  Yes,  yes  Barrier to discharge identified:No.  Summary and Recommendations:   Mary [name] 06/06/2011, 3:51 PM

## 2011-06-06 NOTE — Discharge Planning (Signed)
Patient attended AM group with some encouragement [found her in bed, dressed, awake].  Complaining of bad itching, and excused self to get cream and benadryl.  Returned.  C/O sedation due to benadryl.Mary [name] had filled out application for [facility] and I FAXed it to [place] for review.  Mary [name] became tearful in group, talking about how insecurities and fears get in the way of sobriety.  Revealed she did well for 18 mos while in housing and working.  [clean and sober]

## 2011-06-06 NOTE — Progress Notes (Signed)
[facility] Group Notes:  (Counselor/Nursing/MHT/Case Management/Adjunct)  06/06/2011 5:54 PM  Type of Therapy:  Group Therapy at 11:00  Participation Level:  Did Not Attend  Clide Dales 06/06/2011, 5:54 PM

## 2011-06-07 ENCOUNTER — Inpatient Hospital Stay (HOSPITAL_COMMUNITY): Payer: PRIVATE HEALTH INSURANCE

## 2011-06-07 MED ORDER — DIPHENHYDRAMINE HCL 50 MG PO CAPS: 50.0000 mg | ORAL_CAPSULE | Freq: Every day | ORAL | Status: DC

## 2011-06-07 MED ORDER — DIPHENHYDRAMINE HCL 50 MG PO CAPS
50.0000 mg | ORAL_CAPSULE | Freq: Every day | ORAL | Status: DC
  Filled 2011-06-07: qty 14

## 2011-06-07 MED ORDER — FLUOXETINE HCL 20 MG PO CAPS: 20.0000 mg | ORAL_CAPSULE | Freq: Every day | ORAL | Status: DC

## 2011-06-07 MED ORDER — OLANZAPINE 5 MG PO TABS: 5.0000 mg | ORAL_TABLET | Freq: Every day | ORAL | Status: DC

## 2011-06-07 MED ORDER — MICONAZOLE NITRATE 2 % VA CREA: 1.0000 | TOPICAL_CREAM | Freq: Every day | VAGINAL | Status: AC

## 2011-06-07 MED ORDER — METRONIDAZOLE 500 MG PO TABS: 500.0000 mg | ORAL_TABLET | Freq: Two times a day (BID) | ORAL | Status: DC

## 2011-06-07 NOTE — Discharge Instructions (Signed)
Attend 90 meetings in 90 days. Get trusted sponsor from the advise of others or from whomever in meetings seems to make sense, has a proven track record, will hold you responsible for your sobriety, and both expects and insists on total abstinence.  Work the steps HONESTLY with the trusted sponsor. Get obsessed with your recovery by often reminding yourself of how DEADLY this dredged horrible disease of addiction JUST IS. Focus the first month on speaker meetings where you specifically look at how your life has been wrecked by drugs/alcohol and how your life has been similar to that of the speakers.   

## 2011-06-07 NOTE — Progress Notes (Signed)
Patient ID: Mary [name], female   DOB: 1965/11/12, 46 y.o.   MRN: [id number] Pt was picked up by staff from [facility]> She voiced understanding of discharge instructions and of follow up. She denies thoughts of SI. All belongs taken  With her.

## 2011-06-07 NOTE — Progress Notes (Signed)
[facility] Case Management Discharge Plan:  Will you be returning to the same living situation after discharge: No. At discharge, do you have transportation home?:Yes,  [facility] will pick up Do you have the ability to pay for your medications:Yes,  Provided by [facility]  Interagency Information:     Release of information consent forms completed and in the chart;  Patient's signature needed at discharge.  Patient to Follow up at:  Follow-up Information    Follow up with [facility] on 06/07/2011. (Pt will be discharging to the [facility] program from the hospital on 06/07/11 . Pt will be picked up by the [facility] program at 11:30AM.)    Contact information:   [address] [place] [address] [phone]         Patient denies SI/HI:   Yes,  yes    Safety Planning and Suicide Prevention discussed:  Yes,  yes  Barrier to discharge identified:No.  Summary and Recommendations:   Ida Rogue 06/07/2011, 10:31 AM

## 2011-06-07 NOTE — Discharge Summary (Signed)
Physician Discharge Summary Note  Patient:  Mary [name] is an 46 y.o., female MRN:  [id number] DOB:  Oct 30, 1965 Patient phone:  [phone] (home)  Patient address:   [address] [place] [address],   Date of Admission:  06/03/2011 Date of Discharge: 06/07/2011 Reason for Admission:  Discharge Diagnoses: Principal Problem:  *Polysubstance abuse Active Problems:  BV (bacterial vaginosis)  Axis Diagnosis:   AXIS I:  Cocaine and Cannabis Dependence; Mood Disorder NOS; r/o SIMD; r/o PD NOS with Cluster B Traits  AXIS II:  Cluster B Traits AXIS III:   Past Medical History  Diagnosis Date  . Hernia   . Hepatitis C   . Drug abuse and dependence    AXIS IV:  housing problems, occupational problems, other psychosocial or environmental problems, problems related to social environment, problems with access to health care services and problems with primary support group AXIS V:  51-60 moderate symptoms  Level of Care:  Long-term IP psych.  Hospital Course:  Mary [name] was admitted for cocaine and cannabis dependence and medication management. She was restarted on her regular medication and her health problems were addressed. Mary [name] met with the Case manager, Social worker for development of plan for continued stabilization upon discharge.  She elected to try to get into [facility] which she states she had been trying to get into for a year.  Her symptoms were monitored with a daily self inventory and showed improvement. She was also involved in groups through AA/NA as well as unit programming.  She denied any SI/HI, but had noted AH when she was using cocaine.  She has not had that since her admission.  Upon acceptance into the [facility] program Mary [name] was felt stable enough to be discharged by the treatment team and she is discharged to [facility].  Consults:  None  Significant Diagnostic Studies:  labs:   Discharge Vitals:   Blood pressure 129/63, pulse 72, temperature 97.9 F (36.6 C),  temperature source Oral, resp. rate 16, height 5\' 4"  (1.626 m), weight 68.04 kg (150 lb), last menstrual period 05/05/2011.  Mental Status Exam: See Mental Status Examination and Suicide Risk Assessment completed by Attending Physician prior to discharge.  Discharge destination:  Other:  [facility]  Is patient on multiple antipsychotic therapies at discharge:  No   Has Patient had three or more failed trials of antipsychotic monotherapy by history:  No  Recommended Plan for Multiple Antipsychotic Therapies: Not applicable  Discharge Orders    Future Orders Please Complete By Expires   Diet - low sodium heart healthy      Increase activity slowly      Discharge instructions      Comments:   Take all medications as prescribed.     Medication List  As of 06/07/2011 10:45 AM   TAKE these medications      Indication    diphenhydrAMINE 50 MG capsule   Commonly known as: BENADRYL   Take 1 capsule (50 mg total) by mouth at bedtime. For hives       FLUoxetine 20 MG capsule   Commonly known as: PROZAC   Take 1 capsule (20 mg total) by mouth daily. For depression.       metroNIDAZOLE 500 MG tablet   Commonly known as: FLAGYL   Take 1 tablet (500 mg total) by mouth 2 (two) times daily. For infection       miconazole 2 % vaginal cream   Commonly known as: MONISTAT 7   Place 7  Applicatorfuls vaginally at bedtime. For infection       OLANZapine 5 MG tablet   Commonly known as: ZYPREXA   Take 1 tablet (5 mg total) by mouth at bedtime. For anxiety            Follow-up Information    Follow up with [facility] on 06/07/2011. (Pt will be discharging to the [facility] program from the hospital on 06/07/11 . Pt will be picked up by the [facility] program at 11:30AM.)    Contact information:   [address] [place] [address] [phone]         Follow-up recommendations:  Activity:  as tolerated. 30 minutes per day is recommended. Diet:  Heart healthy. Tests:  routine testing as needed for  management of Hepatitis C Other:  regular therapy with talk therapist and psychiatrist.  Comments:    Signed: Lloyd Huger T. Latrise Bowland PAC for Dr. Lupe Carney 11:00 AM 06/07/2011

## 2011-06-07 NOTE — Progress Notes (Signed)
[facility] Group Notes:  (Counselor/Nursing/MHT/Case Management/Adjunct)  06/07/2011 12:41 PM  Type of Therapy:  Group Therapy  Participation Level:  Minimal  Participation Quality:  Appropriate and Drowsy  Affect:  Appropriate  Cognitive:  Appropriate  Insight:  Limited  Engagement in Group:  Limited  Engagement in Therapy:  Limited  Modes of Intervention:  Clarification, Education, Support and Exploration  Summary of Progress/Problems: Patient reports she currently has emotions of "anxiousness, confidence, and love". Patient states this is her current mood and it could change at any moment. Patient had limited participation in group discussion on emotion regulation. Patient laid down in chair throughout group session.   Wilmon Arms 06/07/2011, 12:41 PM
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
## 2011-06-07 NOTE — Progress Notes (Signed)
Patient ID: Mary [name], female   DOB: 09/24/1965, 46 y.o.   MRN: [id number] She has been up for medication and part of one group. Saying that the night medication  Is making her sleep in the AM.  Denies depression hopeless at 7, denies SI thoughts. Has been encouraged to attend groups.  She is to go today to [facility] for a chest  X-ray to r/o TB.

## 2011-06-09 NOTE — Progress Notes (Signed)
Patient Discharge Instructions:  Psychiatric Admission Assessment Note Provided,  06/09/2011 After Visit Summary (AVS) Provided,  06/09/2011 Face Sheet Provided, 06/09/2011 Faxed/Sent to the Next Level Care provider:  06/09/2011 Sent Suicide Risk Assessment - Discharge Assessment 06/09/2011  Faxed to [facility] @ [phone]  Wandra Scot, 06/09/2011, 3:08 PM

## 2011-06-12 NOTE — Progress Notes (Signed)
Cosigned by Carney Bern, LCSWA 4/1/20135:08 PM

## 2011-06-26 ENCOUNTER — Encounter (HOSPITAL_COMMUNITY): Payer: Self-pay

## 2011-06-26 ENCOUNTER — Emergency Department (HOSPITAL_COMMUNITY)
Admission: EM | Admit: 2011-06-26 | Discharge: 2011-06-27 | Disposition: A | Discharge status: Other Acute Inpatient Hospital | Payer: Self-pay | Attending: Emergency Medicine | Admitting: Emergency Medicine

## 2011-06-26 DIAGNOSIS — Z046 Encounter for general psychiatric examination, requested by authority: Secondary | ICD-10-CM | POA: Insufficient documentation

## 2011-06-26 DIAGNOSIS — Z8619 Personal history of other infectious and parasitic diseases: Secondary | ICD-10-CM | POA: Insufficient documentation

## 2011-06-26 DIAGNOSIS — F329 Major depressive disorder, single episode, unspecified: Secondary | ICD-10-CM | POA: Insufficient documentation

## 2011-06-26 DIAGNOSIS — R404 Transient alteration of awareness: Secondary | ICD-10-CM | POA: Insufficient documentation

## 2011-06-26 DIAGNOSIS — F3289 Other specified depressive episodes: Secondary | ICD-10-CM | POA: Insufficient documentation

## 2011-06-26 DIAGNOSIS — R45851 Suicidal ideations: Secondary | ICD-10-CM | POA: Insufficient documentation

## 2011-06-26 HISTORY — DX: Depression, unspecified: F32.A

## 2011-06-26 LAB — COMPREHENSIVE METABOLIC PANEL
AST: 17 U/L (ref 0–37)
BUN: 10 mg/dL (ref 6–23)
CO2: 23 mEq/L (ref 19–32)
Calcium: 10.4 mg/dL (ref 8.4–10.5)
Chloride: 103 mEq/L (ref 96–112)
Creatinine, Ser: 0.72 mg/dL (ref 0.50–1.10)
GFR calc Af Amer: >90 mL/min (ref >90)
GFR calc non Af Amer: >90 mL/min (ref >90)
Glucose, Bld: 111 mg/dL — ABNORMAL HIGH (ref 70–99)
Total Bilirubin: 0.2 mg/dL — ABNORMAL LOW (ref 0.3–1.2)

## 2011-06-26 LAB — RAPID URINE DRUG SCREEN, HOSP PERFORMED
Amphetamines: NOT DETECTED
Barbiturates: NOT DETECTED
Benzodiazepines: NOT DETECTED
Cocaine: POSITIVE — AB
Opiates: NOT DETECTED
Tetrahydrocannabinol: POSITIVE — AB

## 2011-06-26 LAB — COMPREHENSIVE METABOLIC PANEL WITH GFR
ALT: 12 U/L (ref 0–35)
Albumin: 3.7 g/dL (ref 3.5–5.2)
Alkaline Phosphatase: 86 U/L (ref 39–117)
Potassium: 3.7 meq/L (ref 3.5–5.1)
Sodium: 134 meq/L — ABNORMAL LOW (ref 135–145)
Total Protein: 7.1 g/dL (ref 6.0–8.3)

## 2011-06-26 LAB — CBC
HCT: 39 % (ref 36.0–46.0)
Hemoglobin: 13.7 g/dL (ref 12.0–15.0)
MCH: 32.2 pg (ref 26.0–34.0)
MCHC: 35.1 g/dL (ref 30.0–36.0)
MCV: 91.5 fL (ref 78.0–100.0)
Platelets: 329 10*3/uL (ref 150–400)
RBC: 4.26 MIL/uL (ref 3.87–5.11)
RDW: 13.6 % (ref 11.5–15.5)
WBC: 7.1 10*3/uL (ref 4.0–10.5)

## 2011-06-26 LAB — PREGNANCY, URINE: Preg Test, Ur: NEGATIVE

## 2011-06-26 LAB — ETHANOL: Alcohol, Ethyl (B): <11 mg/dL (ref 0–11)

## 2011-06-26 MED ORDER — ZOLPIDEM TARTRATE 5 MG PO TABS: 5.0000 mg | ORAL_TABLET | Freq: Every evening | ORAL | Status: DC | PRN

## 2011-06-26 MED ORDER — IBUPROFEN 600 MG PO TABS: 600.0000 mg | ORAL_TABLET | Freq: Three times a day (TID) | ORAL | Status: DC | PRN

## 2011-06-26 MED ORDER — ALUM & MAG HYDROXIDE-SIMETH 200-200-20 MG/5ML PO SUSP
30.0000 mL | Freq: Four times a day (QID) | ORAL | Status: DC | PRN
  Administered 2011-06-26: 30 mL via ORAL
  Filled 2011-06-26 (×2): qty 30

## 2011-06-26 MED ORDER — NICOTINE 21 MG/24HR TD PT24
21.0000 mg | MEDICATED_PATCH | Freq: Every day | TRANSDERMAL | Status: DC
  Filled 2011-06-26 (×2): qty 1

## 2011-06-26 MED ORDER — LORAZEPAM 1 MG PO TABS: 1.0000 mg | ORAL_TABLET | Freq: Three times a day (TID) | ORAL | Status: DC | PRN

## 2011-06-26 MED ORDER — ONDANSETRON HCL 4 MG PO TABS
4.0000 mg | ORAL_TABLET | Freq: Three times a day (TID) | ORAL | Status: DC | PRN
  Administered 2011-06-26: 4 mg via ORAL
  Filled 2011-06-26: qty 1

## 2011-06-26 MED ORDER — CALCIUM CARBONATE ANTACID 500 MG PO CHEW
1.0000 | CHEWABLE_TABLET | Freq: Once | ORAL | Status: DC
  Filled 2011-06-26: qty 1

## 2011-06-26 MED ORDER — PANTOPRAZOLE SODIUM 40 MG PO TBEC
40.0000 mg | DELAYED_RELEASE_TABLET | Freq: Every day | ORAL | Status: DC
  Administered 2011-06-26: 40 mg via ORAL
  Filled 2011-06-26 (×3): qty 1

## 2011-06-26 NOTE — ED Provider Notes (Signed)
History     CSN: [id number]  Arrival date & time 06/26/11  4098   First MD Initiated Contact with Patient 06/26/11 [id number]      Chief Complaint  Patient presents with  . Medical Clearance    (Consider location/radiation/quality/duration/timing/severity/associated sxs/prior treatment) HPI Comments: Patient comes in today for crack/cocaine abuse and recent suicidal thoughts.  She states that she feels as if she "would be better off dead."  She does not have an active suicidal plan. She was admitted to [facility] on 06/03/11 and discharged to [facility] on 06/07/11.  She reports that she was at the [facility] program for 5-6 days and then decided to leave.  She denies any hallucinations or withdrawal symptoms at this time.  She currently uses cocaine daily.  Last use was approximately 2 hours ago.  She denies any homicidal ideations.    The history is provided by the patient.    Past Medical History  Diagnosis Date  . Hernia   . Hepatitis C   . Drug abuse and dependence     Past Surgical History  Procedure Date  . Splenectomy   . Tubal ligation     History reviewed. No pertinent family history.  History  Substance Use Topics  . Smoking status: Current Everyday Smoker -- 1.0 packs/day  . Smokeless tobacco: Not on file  . Alcohol Use: No    OB History    Grav Para Term Preterm Abortions TAB SAB Ect Mult Living                  Review of Systems  Constitutional: Negative for fever and chills.  Respiratory: Negative for shortness of breath.   Cardiovascular: Negative for chest pain.  Gastrointestinal: Negative for nausea and vomiting.  Psychiatric/Behavioral: Positive for suicidal ideas and dysphoric mood. Negative for hallucinations and self-injury. The patient is not nervous/anxious.     Allergies  Review of patient's allergies indicates no known allergies.  Home Medications   Current Outpatient Rx  Name Route Sig Dispense Refill  . DIPHENHYDRAMINE HCL 50 MG PO  CAPS Oral Take 1 capsule (50 mg total) by mouth at bedtime. For hives 10 capsule 0  . FLUOXETINE HCL 20 MG PO CAPS Oral Take 1 capsule (20 mg total) by mouth daily. For depression. 30 capsule 0  . METRONIDAZOLE 500 MG PO TABS Oral Take 1 tablet (500 mg total) by mouth 2 (two) times daily. For infection 8 tablet 0  . OLANZAPINE 5 MG PO TABS Oral Take 1 tablet (5 mg total) by mouth at bedtime. For anxiety 30 tablet 0    BP 120/65  Pulse 86  Temp(Src) 98.5 F (36.9 C) (Oral)  Resp 19  Ht 5\' 4"  (1.626 m)  Wt 145 lb 9.6 oz (66.044 kg)  BMI 24.99 kg/m2  SpO2 97%  LMP 06/08/2011  Physical Exam  Nursing note and vitals reviewed. Constitutional: She appears well-developed and well-nourished. No distress.       Somnolent   HENT:  Head: Normocephalic and atraumatic.  Mouth/Throat: Oropharynx is clear and moist.  Eyes: EOM are normal. Pupils are equal, round, and reactive to light.  Neck: Normal range of motion.  Cardiovascular: Normal rate, regular rhythm and normal heart sounds.   Pulmonary/Chest: Effort normal and breath sounds normal. No respiratory distress.  Neurological: She is alert.  Skin: Skin is warm and dry. She is not diaphoretic.  Psychiatric: Her speech is normal. She is not agitated, not aggressive and not actively hallucinating.  Thought content is not paranoid and not delusional. Cognition and memory are normal. She exhibits a depressed mood. She expresses suicidal ideation. She expresses no homicidal ideation. She expresses no suicidal plans and no homicidal plans.    ED Course  Procedures (including critical care time)   Labs Reviewed  CBC  URINE RAPID DRUG SCREEN (HOSP PERFORMED)  COMPREHENSIVE METABOLIC PANEL  ETHANOL   No results found.   No diagnosis found.  Patient discussed with Dr. Oletta Lamas.  8:18 AM Discussed patient with ACT team who will come evaluate patient.  MDM  Patient currently uses daily crack/cocaine.  She comes in today requesting help with  stopping her drug use and also because she is having thoughts that she would be better off dead.  She denies any suicidal plan.  ACT team has been consulted to evaluate patient.          Pascal Lux [place], PA-C 06/26/11 [id number]
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
## 2011-06-26 NOTE — ED Notes (Signed)
Patient is resting comfortably. 

## 2011-06-26 NOTE — ED Notes (Signed)
Pt states she is having thoughts of killing self.  Pt states has had before when she smokes crack.

## 2011-06-26 NOTE — ED Provider Notes (Signed)
Medical screening examination/treatment/procedure(s) were performed by non-physician practitioner and as supervising physician I was immediately available for consultation/collaboration.   Gavin Pound. Kayra Crowell, MD 06/26/11 1542

## 2011-06-26 NOTE — ED Notes (Signed)
Pt resting.  Pharmacy tech at bedside

## 2011-06-26 NOTE — ED Notes (Signed)
Pt continues to rest quietly sitter remains at bedside

## 2011-06-26 NOTE — ED Notes (Signed)
Pt provided snack and beverage per request  

## 2011-06-26 NOTE — ED Notes (Signed)
Pt provided dinner.

## 2011-06-27 ENCOUNTER — Encounter (HOSPITAL_COMMUNITY): Payer: Self-pay | Admitting: *Deleted

## 2011-06-27 ENCOUNTER — Inpatient Hospital Stay (HOSPITAL_COMMUNITY)
Admission: AD | Admit: 2011-06-27 | Discharge: 2011-07-04 | DRG: 897 | Disposition: A | Discharge status: Home / Self Care | Payer: PRIVATE HEALTH INSURANCE | Source: Ambulatory Visit | Attending: Psychiatry | Admitting: Psychiatry

## 2011-06-27 DIAGNOSIS — A499 Bacterial infection, unspecified: Secondary | ICD-10-CM | POA: Diagnosis present

## 2011-06-27 DIAGNOSIS — Z5900 Homelessness unspecified: Secondary | ICD-10-CM

## 2011-06-27 DIAGNOSIS — F39 Unspecified mood [affective] disorder: Secondary | ICD-10-CM | POA: Diagnosis present

## 2011-06-27 DIAGNOSIS — K219 Gastro-esophageal reflux disease without esophagitis: Secondary | ICD-10-CM | POA: Diagnosis present

## 2011-06-27 DIAGNOSIS — R112 Nausea with vomiting, unspecified: Secondary | ICD-10-CM | POA: Insufficient documentation

## 2011-06-27 DIAGNOSIS — N760 Acute vaginitis: Secondary | ICD-10-CM | POA: Diagnosis present

## 2011-06-27 DIAGNOSIS — B1920 Unspecified viral hepatitis C without hepatic coma: Secondary | ICD-10-CM | POA: Diagnosis present

## 2011-06-27 DIAGNOSIS — K449 Diaphragmatic hernia without obstruction or gangrene: Secondary | ICD-10-CM | POA: Diagnosis present

## 2011-06-27 DIAGNOSIS — B9689 Other specified bacterial agents as the cause of diseases classified elsewhere: Secondary | ICD-10-CM

## 2011-06-27 DIAGNOSIS — F1910 Other psychoactive substance abuse, uncomplicated: Secondary | ICD-10-CM

## 2011-06-27 DIAGNOSIS — F1920 Other psychoactive substance dependence, uncomplicated: Principal | ICD-10-CM | POA: Diagnosis present

## 2011-06-27 MED ORDER — CALCIUM CARBONATE ANTACID 500 MG PO CHEW
1.0000 | CHEWABLE_TABLET | Freq: Once | ORAL | Status: DC
  Filled 2011-06-27: qty 1

## 2011-06-27 MED ORDER — NICOTINE 21 MG/24HR TD PT24
21.0000 mg | MEDICATED_PATCH | Freq: Every day | TRANSDERMAL | Status: DC
  Filled 2011-06-27 (×4): qty 1

## 2011-06-27 MED ORDER — ALUM & MAG HYDROXIDE-SIMETH 200-200-20 MG/5ML PO SUSP: 30.0000 mL | ORAL | Status: DC | PRN

## 2011-06-27 MED ORDER — IBUPROFEN 600 MG PO TABS
600.0000 mg | ORAL_TABLET | Freq: Three times a day (TID) | ORAL | Status: DC | PRN
  Administered 2011-06-28 – 2011-06-30 (×2): 600 mg via ORAL
  Filled 2011-06-27 (×2): qty 1

## 2011-06-27 MED ORDER — ALUM & MAG HYDROXIDE-SIMETH 200-200-20 MG/5ML PO SUSP: 30.0000 mL | Freq: Four times a day (QID) | ORAL | Status: DC | PRN

## 2011-06-27 MED ORDER — PANTOPRAZOLE SODIUM 40 MG PO TBEC
40.0000 mg | DELAYED_RELEASE_TABLET | Freq: Every day | ORAL | Status: DC
  Administered 2011-06-29 – 2011-07-04 (×5): 40 mg via ORAL
  Filled 2011-06-27 (×9): qty 1

## 2011-06-27 MED ORDER — DIPHENHYDRAMINE HCL 25 MG PO CAPS
50.0000 mg | ORAL_CAPSULE | Freq: Every evening | ORAL | Status: DC | PRN
  Administered 2011-06-28 – 2011-07-03 (×5): 50 mg via ORAL
  Filled 2011-06-27 (×2): qty 2

## 2011-06-27 MED ORDER — CHLORDIAZEPOXIDE HCL 25 MG PO CAPS
25.0000 mg | ORAL_CAPSULE | ORAL | Status: DC | PRN
  Administered 2011-06-30 – 2011-07-03 (×2): 25 mg via ORAL
  Filled 2011-06-27 (×2): qty 1

## 2011-06-27 MED ORDER — MAGNESIUM HYDROXIDE 400 MG/5ML PO SUSP: 30.0000 mL | Freq: Every day | ORAL | Status: DC | PRN

## 2011-06-27 MED ORDER — LORAZEPAM 1 MG PO TABS
1.0000 mg | ORAL_TABLET | Freq: Three times a day (TID) | ORAL | Status: DC | PRN
  Administered 2011-06-28 – 2011-07-03 (×5): 1 mg via ORAL
  Filled 2011-06-27 (×5): qty 1

## 2011-06-27 NOTE — ED Notes (Signed)
Patient taken to TCU to take a shower accompanied by sitter and security

## 2011-06-27 NOTE — ED Notes (Signed)
Patient accepted to [facility], called and gave report to Danelle Earthly, RN at [facility], states that room will be ready at 1930, notified patient of transfer time.

## 2011-06-27 NOTE — ED Notes (Signed)
Patient back in room

## 2011-06-27 NOTE — ED Notes (Signed)
Act team in to see patient 

## 2011-06-27 NOTE — ED Notes (Signed)
Patient belongings found in locker 31. Moved to [place] [address] temporarily until patient is discharged or moved elsewhere. Bags labeled by MRN sticker and noted why they are in [place] [address]. Raynelle Fanning RN of [facility] aware.

## 2011-06-27 NOTE — Progress Notes (Signed)
Patient ID: Mary [name], female   DOB: 09-23-1965, 46 y.o.   MRN: [id number]  It was reported by [facility] staff that pt was irritated and "not happy".  Report states pt was brought in by a friend, and having thoughts of SI. On adm the pt denied SI, HI, and A/V.  States left [facility] and went to [facility] for 1 day. Stated was k/o because she didn't make it back to the facility before 1930.  "It was off to the races ever since".  States she living with different people and would like to have long term treatment. During the adm process pt was laughing with a female friend on the telephone, listened to msgs, and didn't appear to be vested in the process. Pt admits to using crack, etoh, and thc. States that after discharge she did not take any medicines. Support and encouragement was offered.

## 2011-06-27 NOTE — Tx Team (Signed)
Initial Interdisciplinary Treatment Plan  PATIENT STRENGTHS: (choose at least two) Ability for insight Active sense of humor Average or above average intelligence Capable of independent living Communication skills General fund of knowledge Physical Health Religious Affiliation Supportive family/friends Work skills  PATIENT STRESSORS: Financial difficulties Medication change or noncompliance Substance abuse   PROBLEM LIST: Problem List/Patient Goals Date to be addressed Date deferred Reason deferred Estimated date of resolution  "To learn how to stay clean" 06/27/11           "To learn how to stay focused" 06/27/11           Substance abuse 06/27/11     Increased risk for suicide 06/27/11                        DISCHARGE CRITERIA:  Ability to meet basic life and health needs Adequate post-discharge living arrangements Improved stabilization in mood, thinking, and/or behavior Motivation to continue treatment in a less acute level of care Need for constant or close observation no longer present Reduction of life-threatening or endangering symptoms to within safe limits Safe-care adequate arrangements made Verbal commitment to aftercare and medication compliance Withdrawal symptoms are absent or subacute and managed without 24-hour nursing intervention  PRELIMINARY DISCHARGE PLAN: Attend aftercare/continuing care group Attend PHP/IOP Attend 12-step recovery group Outpatient therapy Participate in family therapy Placement in alternative living arrangements  PATIENT/FAMIILY INVOLVEMENT: This treatment plan has been presented to and reviewed with the patient, Mary [name], and/or family member.  The patient and family have been given the opportunity to ask questions and make suggestions.  [name] 06/27/2011, 9:04 PM

## 2011-06-28 LAB — URINALYSIS, ROUTINE W REFLEX MICROSCOPIC
Leukocytes, UA: NEGATIVE
Nitrite: NEGATIVE
Specific Gravity, Urine: 1.035 — ABNORMAL HIGH (ref 1.005–1.030)
Urobilinogen, UA: 0.2 mg/dL (ref 0.0–1.0)
pH: 6 (ref 5.0–8.0)

## 2011-06-28 MED ORDER — OLANZAPINE 5 MG PO TABS
5.0000 mg | ORAL_TABLET | Freq: Every day | ORAL | Status: DC
  Administered 2011-06-28 – 2011-07-03 (×6): 5 mg via ORAL
  Filled 2011-06-28 (×7): qty 1

## 2011-06-28 MED ORDER — FLUOXETINE HCL 20 MG PO CAPS
20.0000 mg | ORAL_CAPSULE | Freq: Every day | ORAL | Status: DC
  Administered 2011-06-28 – 2011-07-04 (×7): 20 mg via ORAL
  Filled 2011-06-28 (×10): qty 1

## 2011-06-28 NOTE — Progress Notes (Signed)
[facility] Group Notes:  (Counselor/Nursing/MHT/Case Management/Adjunct)  06/28/2011 2:34 PM  Type of Therapy:  1:15PM Group Therapy  Participation Level:  Did Not Attend  Mary [name] 06/28/2011, 2:34 PM

## 2011-06-28 NOTE — Progress Notes (Signed)
Cosigned by Jinger Middlesworth C Malaisha Silliman, LCSWA 4/17/20133:06 PM      

## 2011-06-28 NOTE — Progress Notes (Signed)
Adult Psychosocial Assessment Update Interdisciplinary Team  [facility] admissions[facility]discharges:  Admissions Discharges  Date: 06/03/2011 Date: 06/07/2011  Date: Date:  Date: Date:  Date: Date:  Date: Date:   Changes since the last Psychosocial Assessment (including adherence to outpatient mental health and/or substance abuse treatment, situational issues contributing to decompensation and/or relapse). Patient reports she did not make it to [facility] by her 7pm curfew, so she decided not to  Return. Patient is currently endorsing S/I, with no plan, and reports she can contract for   Safety. Patient reports she has cut herself in the past, the last time was one year ago.  Patient states she is homeless and seeking detox treatment from [place].       Discharge Plan 1. Will you be returning to the same living situation after discharge?   Yes: No:  X   If no, what is your plan?    Patient reports she is currently homeless, and unsure of her living arrangements at this  Time. Patient reports she is unable to return to [facility] program.     2. Would you like a referral for services when you are discharged? Yes:     If yes, for what services?  No:       None Reported       Summary and Recommendations (to be completed by the evaluator) Patient is a 46 year old female. Patient admitted with diagnosis of Major Depression,  Recurrent Severe and Substance Abuse. Patient reports she "no longer wants to live"  Due to drug addiction. Patient would benefit from crisis stabilization, medication   Evaluation, psycho ed and group therapy, and case management for discharge planning.                 Signature:  Wilmon Arms, 06/28/2011 3:36 PM

## 2011-06-28 NOTE — Treatment Plan (Signed)
Interdisciplinary Treatment Plan Update (Adult)  Date: 06/28/2011  Time Reviewed: 9:57 AM   Progress in Treatment: Attending groups: Yes Participating in groups: Yes Taking medication as prescribed: Yes Tolerating medication: Yes   Family/Significant other contact made: None identified now  Patient understands diagnosis:  Yes  As evidenced by asking for help with SI, depression, cocaine addiction Discussing patient identified problems/goals with staff:  Yes  See below Medical problems stabilized or resolved:  Yes Denies suicidal/homicidal ideation: No  States she wants to die, but contracts for safety Issues/concerns per patient self-inventory:  Not filled out Other:  New problem(s) identified: N/A  Reason for Continuation of Hospitalization: Depression Medication stabilization Suicidal ideation  Interventions implemented related to continuation of hospitalization:  Attend to medical/physical needs  Encourage group attendance and participation  Additional comments: States she missed curfew at [facility] and did not return   Been smoking crack constantly since then  Estimated length of stay: 3-4 days   Discharge Plan: Unknown  New goal(s): N/A  Review of initial/current patient goals per problem list:   1.  Goal(s): Eliminate SI  Met:  No  Target date:4/18  As evidenced by: Self report  2.  Goal (s): Decrease depression  Met:  No  Target date:4/21  As evidenced by: Report of 5 or less on self inventory  3.  Goal(s): Identify comprehensive sobriety plan  Met:  No  Target date: 4/19  As evidenced by: Graciella Belton will ask for referrals for help with substance abuse  4.  Goal(s):  Met:  No  Target date:  As evidenced by:  Attendees: Patient: Mary [name]  06/28/2011  9:57 AM  Family:     Physician:  Lupe Carney 06/28/2011 9:57 AM   Nursing: [name]   06/28/2011 9:57 AM   Case Manager:  Richelle Ito, LCSW 06/28/2011 9:57 AM   Counselor:  Ronda Fairly, LCSWA 06/28/2011 9:57 AM   Other:     Other:     Other:     Other:      Scribe for Treatment Team:   Ida Rogue, 06/28/2011 9:57 AM

## 2011-06-28 NOTE — Progress Notes (Signed)
Pt stayed in bed until late this afternoon when she did get a shower and changed her clothes.  She rated her depression a 5 hopelessness a 7 as well as her anxiety a 7 on her self-inventory.  She denied any S/H ideations when we discussed her self-inventory but when she came into treatment team, she admitted that she was having thoughts off and on but no plan.  She did state,"I can come to staff if I feel like I may harm myself"  Her biggest concern today has been being checked for STD's.  She did get orders for HIV (consent signed in shadow chart), RPR, u/a, G/C and chlamydia.  She did give the specimen just after dinner tonight.  She has remained up in dayroom on the phone and interacting with select peers this afternoon.  She was started on prozac today and will be getting zyprexa tonight which she voice understanding to all her orders.  She did c/o headache and anxiety this morning around 0837 so Ibuprofen and ativan was given as ordered (see mar, daily care and pain tabs).  Pt has not been clear on what she needs from the hospitalization.  Again, her main focus has been the check for STD's.

## 2011-06-28 NOTE — Discharge Planning (Signed)
Pt seen in treatment team. Not present in morning group. Recent admission. States that her d/c plan the last time she was admitted was to go to the [facility] program. Pt states that she did not complete the program because of her failure to make curfew. She states that she felt she would be kicked out of the program so she decided not to return. Pt endorses SI and states that she is able to contract for safety. Pt unable to identify sobriety plan at the moment.
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
## 2011-06-28 NOTE — Progress Notes (Signed)
[facility] Group Notes:  (Counselor/Nursing/MHT/Case Management/Adjunct)  06/28/2011 12:16 PM  Type of Therapy:  Group Therapy  Participation Level:  Did Not Attend  Mary [name] 06/28/2011, 12:16 PM
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
## 2011-06-28 NOTE — Progress Notes (Signed)
Cosigned by Carney Bern, LCSWA 4/17/20133:06 PM

## 2011-06-29 DIAGNOSIS — F1910 Other psychoactive substance abuse, uncomplicated: Secondary | ICD-10-CM

## 2011-06-29 DIAGNOSIS — F0630 Mood disorder due to known physiological condition, unspecified: Secondary | ICD-10-CM

## 2011-06-29 LAB — GC PROBE AMPLIFICATION, URINE: GC Probe Amp, Urine: NEGATIVE

## 2011-06-29 LAB — HIV ANTIBODY (ROUTINE TESTING W REFLEX): HIV: NONREACTIVE

## 2011-06-29 MED ORDER — MICONAZOLE NITRATE 2 % VA CREA
1.0000 | TOPICAL_CREAM | Freq: Once | VAGINAL | Status: AC
  Administered 2011-06-29: 1 via VAGINAL
  Filled 2011-06-29: qty 45

## 2011-06-29 MED ORDER — CLOTRIMAZOLE 1 % VA CREA: 1.0000 | TOPICAL_CREAM | Freq: Once | VAGINAL | Status: DC

## 2011-06-29 MED ORDER — METRONIDAZOLE 500 MG PO TABS
500.0000 mg | ORAL_TABLET | Freq: Two times a day (BID) | ORAL | Status: AC
  Administered 2011-06-29 – 2011-07-01 (×4): 500 mg via ORAL
  Filled 2011-06-29 (×6): qty 1

## 2011-06-29 NOTE — Progress Notes (Signed)
Cosigned by Nou Chard C Xavier Fournier, LCSWA 4/18/20133:50 PM   

## 2011-06-29 NOTE — Progress Notes (Signed)
[facility] Group Notes:  (Counselor/Nursing/MHT/Case Management/Adjunct)  06/29/2011 1:08 PM  Type of Therapy:  Group Therapy  Participation Level:  None  Participation Quality:  Drowsy and Inattentive  Affect:  Asleep  Cognitive:  Unable to Assess  Insight:  None  Engagement in Group:  None  Engagement in Therapy:  None  Modes of Intervention:  Education and Exploration  Summary of Progress/Problems: Patient asleep during group session.   Mary [name] 06/29/2011, 1:08 PM

## 2011-06-29 NOTE — Progress Notes (Signed)
Patient stayed in bed most of the morning.  When she did finally get up for a group, she ended up sleeping through it.  She did not get up for medications this morning despite being called twice over the intercom system and physically awakened by the tech.  She did take her Prozac at noon with her other noon medication.  Affect is flat and patient is a bit irritable on encounter.  Requested to have nicotine patch discontinued, which was done.

## 2011-06-29 NOTE — Discharge Planning (Signed)
Met with pt on this day to discuss d/c plan. Pt is interested in going to [facility] . Explained to pt the process to get into [facility] and the importance of having a plan B. Pt identified [facility] as a back up plan if she is unable to get into [facility].

## 2011-06-29 NOTE — Progress Notes (Addendum)
Pt complaining of a vaginal itch along with a moderate amount of white discharge. Order obtained to be started on a one time dose of monistat vaginal cream this evening, with follow-up in the morning by physician for possible continuation. Pt did not attend karaoke this evening. Pt reports some irritability in relation to her withdrawal. Other than that pt has been calm and cooperative. Continued support and availability as needed has been extended to this pt. Pt safety remains with q26min checks.

## 2011-06-29 NOTE — Discharge Planning (Signed)
Found patient bed prior to AM group.  States she felt bad physically-better than yesterday-but not well enough to attend my group.  Stated she would attend 10:00 group.  Found her in bed at 11:15.  Told her I would recommend D/C today to Dr if she did not get up and go to group.  She reluctantly got up.

## 2011-06-29 NOTE — Progress Notes (Signed)
Patient ID: Mary [name], female   DOB: 01/15/1966, 46 y.o.   MRN: [id number] Patient was pleasant and cooperative during the assessment. Was laughing while she informed the writer that "she had to get out of the room with that lady", referring to the roommate she had during the night. Then pt started counting money that she'd received from a visitor and informed the writer that the female visitor also brought clothes. Continues to want long term treatment. Support and encouragement was offered.

## 2011-06-29 NOTE — H&P (Signed)
Pt seen and evaluated upon admission.  Completed Admission Suicide Risk Assessment.  See orders.  Pt agreeable with plan.  Discussed with team.   
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
## 2011-06-29 NOTE — Progress Notes (Signed)
[facility] MD Progress Note  06/29/2011 5:07 PM  Diagnosis:  Cocaine Dependence; SIMD   ADL's:  Intact  Sleep: Poor  Appetite:  Fair  Suicidal Ideation:  None Homicidal Ideation:  none  AEB (as evidenced by): Subjective: Pt. States that she still has a thick yello discharge with a foul odor from the vagina.  Still present from her last admission. She did not complete the flagyl course that she was given. Mental Status Examination/Evaluation: Objective:  Appearance: Disheveled  Eye Contact::  Poor  Speech:  Normal Rate  Volume:  Decreased  Mood:  Irritable states depression is a 10/10 due to her drug use  Affect:  Congruent  Thought Process:  Coherent  Orientation:  Full  Thought Content:  WDL  Suicidal Thoughts:  No  Homicidal Thoughts:  No  Memory:  Immediate;   Fair  Judgement:  Fair  Insight:  Fair  Psychomotor Activity:  Decreased  Concentration:  Fair  Recall:  Fair  Akathisia:  No  Handed:    AIMS (if indicated):     Assets:  Desire for Improvement  Sleep:  Number of Hours: 6.75    Vital Signs:Blood pressure 93/67, pulse 114, temperature 97.3 F (36.3 C), temperature source Oral, resp. rate 16, height 5\' 5"  (1.651 m), weight 65.318 kg (144 lb), last menstrual period 06/08/2011. Current Medications: Current Facility-Administered Medications  Medication Dose Route Frequency Provider Last Rate Last Dose  . alum & mag hydroxide-simeth (MAALOX/MYLANTA) 200-200-20 MG/5ML suspension 30 mL  30 mL Oral Q4H PRN Viviann Spare, NP      . calcium carbonate (TUMS - dosed in mg elemental calcium) chewable tablet 200 mg of elemental calcium  1 tablet Oral Once Viviann Spare, NP      . chlordiazePOXIDE (LIBRIUM) capsule 25 mg  25 mg Oral Q4H PRN Viviann Spare, NP      . diphenhydrAMINE (BENADRYL) capsule 50 mg  50 mg Oral QHS PRN Viviann Spare, NP   50 mg at 06/28/11 2211  . FLUoxetine (PROZAC) capsule 20 mg  20 mg Oral Daily Verne Spurr, PA-C   20 mg at 06/29/11 1253    . ibuprofen (ADVIL,MOTRIN) tablet 600 mg  600 mg Oral Q8H PRN Viviann Spare, NP   600 mg at 06/28/11 0837  . LORazepam (ATIVAN) tablet 1 mg  1 mg Oral Q8H PRN Viviann Spare, NP   1 mg at 06/28/11 0837  . magnesium hydroxide (MILK OF MAGNESIA) suspension 30 mL  30 mL Oral Daily PRN Viviann Spare, NP      . OLANZapine (ZYPREXA) tablet 5 mg  5 mg Oral QHS Verne Spurr, PA-C   5 mg at 06/28/11 2211  . pantoprazole (PROTONIX) EC tablet 40 mg  40 mg Oral Q1200 Viviann Spare, NP   40 mg at 06/29/11 1253  . DISCONTD: nicotine (NICODERM CQ - dosed in mg/24 hours) patch 21 mg  21 mg Transdermal Daily Viviann Spare, NP        Lab Results:  Results for orders placed during the hospital encounter of 06/27/11 (from the past 48 hour(s))  CHLAMYDIA PROBE AMPLIFICATION, URINE     Status: Normal   Collection Time   06/28/11  6:22 PM      Component Value Range Comment   Chlamydia, Swab/Urine, PCR NEGATIVE  NEGATIVE    GC PROBE AMPLIFICATION, URINE     Status: Normal   Collection Time   06/28/11  6:22 PM  Component Value Range Comment   GC Probe Amp, Urine NEGATIVE  NEGATIVE    URINALYSIS, ROUTINE W REFLEX MICROSCOPIC     Status: Abnormal   Collection Time   06/28/11  6:22 PM      Component Value Range Comment   Color, Urine YELLOW  YELLOW     APPearance CLOUDY (*) CLEAR     Specific Gravity, Urine 1.035 (*) 1.005 - 1.030     pH 6.0  5.0 - 8.0     Glucose, UA NEGATIVE  NEGATIVE (mg/dL)    Hgb urine dipstick NEGATIVE  NEGATIVE     Bilirubin Urine NEGATIVE  NEGATIVE     Ketones, ur NEGATIVE  NEGATIVE (mg/dL)    Protein, ur NEGATIVE  NEGATIVE (mg/dL)    Urobilinogen, UA 0.2  0.0 - 1.0 (mg/dL)    Nitrite NEGATIVE  NEGATIVE     Leukocytes, UA NEGATIVE  NEGATIVE  MICROSCOPIC NOT DONE ON URINES WITH NEGATIVE PROTEIN, BLOOD, LEUKOCYTES, NITRITE, OR GLUCOSE <1000 mg/dL.  RPR     Status: Normal   Collection Time   06/28/11  8:22 PM      Component Value Range Comment   RPR NON REACTIVE   NON REACTIVE    HIV ANTIBODY (ROUTINE TESTING)     Status: Normal   Collection Time   06/28/11  8:22 PM      Component Value Range Comment   HIV NON REACTIVE  NON REACTIVE      Physical Findings: AIMS:  CIWA:  CIWA-Ar Total: 0  COWS:  COWS Total Score: 0   Treatment Plan Summary: Daily contact with patient to assess and evaluate symptoms and progress in treatment Medication management.  Plan: 1. Will give treatment for suspected bacterial vaginosis in split dose for 1 day.           2. Continue current plan of care without further changes.           3. Pt. States she would like to try residential rehab and CM is notified of this.  [name]. Rachelanne Whidby PAC 06/29/2011, 5:07 PM

## 2011-06-29 NOTE — Progress Notes (Signed)
Recreation Therapy Notes  06/29/2011         Time: 1415      Group Topic/Focus: The focus of this group is on discussing various styles of communication and communicating assertively using 'I' (feeling) statements.  Participation Level: Minimal  Participation Quality: Resistant and Drowsy  Affect: Irritable   Cognitive: Oriented   Additional Comments: Patient slept through about half of group, because irritable when someone took her out of group to speak with her. Patient participated minimally once returning to group.    Christian Treadway 06/29/2011 4:00 PM

## 2011-06-29 NOTE — Progress Notes (Signed)
Cosigned by Carney Bern, LCSWA 4/18/20133:49 PM

## 2011-06-30 LAB — URINALYSIS, ROUTINE W REFLEX MICROSCOPIC
Bilirubin Urine: NEGATIVE
Nitrite: NEGATIVE
Protein, ur: NEGATIVE mg/dL
Specific Gravity, Urine: 1.02 (ref 1.005–1.030)
Urobilinogen, UA: 0.2 mg/dL (ref 0.0–1.0)

## 2011-06-30 NOTE — Progress Notes (Signed)
[facility] Group Notes:  (Counselor/Nursing/MHT/Case Management/Adjunct)  06/30/2011 1:33 PM  Type of Therapy:  Group Therapy at 11am  Participation Level:  Did Not Attend    Mary [name] 06/30/2011, 1:33 PM

## 2011-06-30 NOTE — Progress Notes (Signed)
Quiet today. Encouraged patient to get up for first group of the day. Irritable. Refused Flagyl this AM because she had not eaten. Encouraged to request medication if she had snack this AM. Mood flat and depressed.

## 2011-06-30 NOTE — Progress Notes (Signed)
[facility] Group Notes:  (Counselor/Nursing/MHT/Case Management/Adjunct)  06/30/2011 2:41 PM  Type of Therapy:  Group Therapy at 13:15  Participation Level:  Minimal  Participation Quality:  Appropriate and Attentive  Affect:  Appropriate  Cognitive:  Oriented  Insight:  Limited  Engagement in Group:  Limited  Engagement in Therapy:  Limited  Modes of Intervention:  Education and Exploration  Summary of Progress/Problems: In discussing feelings around relapse, patient reports she often experienced feelings of restlessness and exhaustion. Patient left group early due to back pain.    Wilmon Arms 06/30/2011, 2:41 PM

## 2011-06-30 NOTE — Progress Notes (Signed)
Cosigned by Aneeka Bowden C Lamone Ferrelli, LCSWA 4/19/20135:20 PM   

## 2011-06-30 NOTE — Discharge Planning (Signed)
Mary [name] attended AM group today, unlike previous days.  This after I informed her she would not be referred to rehab unless she came to all groups.  She expressed interest in referral to [facility].  Called.  No openings today.  Will try again on Monday.

## 2011-06-30 NOTE — Progress Notes (Signed)
Patient ID: Mary [name], female   DOB: May 31, 1965, 46 y.o.   MRN: [id number] Pt. Awake, alert, NAD.  Affect and mood are both irritable.    Reviewed nursing care plan.  Pt. Denies SI/HI/AVH.  Pt. States "I'm not crazy."  This Clinical research associate informed the patient that it is hospital policy that these questions be asked by each on-coming nurse and PRN.  Pt,. States she is ready to be discharged.

## 2011-06-30 NOTE — Progress Notes (Signed)
[facility] MD Progress Note  06/30/2011 5:39 PM  Diagnosis:  Polysubstance abuse  ADL's:  Intact  Sleep: Fair  Appetite:  Fair  Suicidal Ideation:  denies Homicidal Ideation:  denies  AEB (as evidenced by):  Mental Status Examination/Evaluation: Objective:  Appearance: Fairly Groomed  Patent attorney::  Fair  Speech:  Clear and Coherent  Volume:  Normal  Mood:  Dysphoric  Affect:  Congruent  Thought Process:  Coherent  Orientation:  Full  Thought Content:  WDL  Suicidal Thoughts:  No  Homicidal Thoughts:  No  Memory:  Immediate;   Fair  Judgement:  Poor  Insight:  Lacking  Psychomotor Activity:  Normal  Concentration:  Fair  Recall:  Fair  Akathisia:  No  Handed:    AIMS (if indicated):     Assets:  Desire for Improvement  Sleep:  Number of Hours: 6.75    Vital Signs:Blood pressure 111/77, pulse 81, temperature 97.3 F (36.3 C), temperature source Oral, resp. rate 20, height 5\' 5"  (1.651 m), weight 65.318 kg (144 lb), last menstrual period 06/08/2011. Current Medications: Current Facility-Administered Medications  Medication Dose Route Frequency Provider Last Rate Last Dose  . alum & mag hydroxide-simeth (MAALOX/MYLANTA) 200-200-20 MG/5ML suspension 30 mL  30 mL Oral Q4H PRN Viviann Spare, NP      . calcium carbonate (TUMS - dosed in mg elemental calcium) chewable tablet 200 mg of elemental calcium  1 tablet Oral Once Viviann Spare, NP      . chlordiazePOXIDE (LIBRIUM) capsule 25 mg  25 mg Oral Q4H PRN Viviann Spare, NP      . diphenhydrAMINE (BENADRYL) capsule 50 mg  50 mg Oral QHS PRN Viviann Spare, NP   50 mg at 06/29/11 2108  . FLUoxetine (PROZAC) capsule 20 mg  20 mg Oral Daily Verne Spurr, PA-C   20 mg at 06/30/11 4098  . ibuprofen (ADVIL,MOTRIN) tablet 600 mg  600 mg Oral Q8H PRN Viviann Spare, NP   600 mg at 06/28/11 0837  . LORazepam (ATIVAN) tablet 1 mg  1 mg Oral Q8H PRN Viviann Spare, NP   1 mg at 06/28/11 0837  . magnesium hydroxide (MILK OF  MAGNESIA) suspension 30 mL  30 mL Oral Daily PRN Viviann Spare, NP      . metroNIDAZOLE (FLAGYL) tablet 500 mg  500 mg Oral Q12H Verne Spurr, PA-C   500 mg at 06/30/11 1254  . miconazole (MONISTAT 7) 2 % vaginal cream 1 Applicatorful  1 Applicatorful Vaginal Once Mike Craze, MD   1 Applicatorful at 06/29/11 2200  . OLANZapine (ZYPREXA) tablet 5 mg  5 mg Oral QHS Verne Spurr, PA-C   5 mg at 06/29/11 2109  . pantoprazole (PROTONIX) EC tablet 40 mg  40 mg Oral Q1200 Viviann Spare, NP   40 mg at 06/30/11 1246  . DISCONTD: clotrimazole (GYNE-LOTRIMIN) vaginal cream 1 Applicatorful  1 Applicatorful Vaginal Once Mike Craze, MD        Lab Results:  Results for orders placed during the hospital encounter of 06/27/11 (from the past 48 hour(s))  CHLAMYDIA PROBE AMPLIFICATION, URINE     Status: Normal   Collection Time   06/28/11  6:22 PM      Component Value Range Comment   Chlamydia, Swab/Urine, PCR NEGATIVE  NEGATIVE    GC PROBE AMPLIFICATION, URINE     Status: Normal   Collection Time   06/28/11  6:22 PM  Component Value Range Comment   GC Probe Amp, Urine NEGATIVE  NEGATIVE    URINALYSIS, ROUTINE W REFLEX MICROSCOPIC     Status: Abnormal   Collection Time   06/28/11  6:22 PM      Component Value Range Comment   Color, Urine YELLOW  YELLOW     APPearance CLOUDY (*) CLEAR     Specific Gravity, Urine 1.035 (*) 1.005 - 1.030     pH 6.0  5.0 - 8.0     Glucose, UA NEGATIVE  NEGATIVE (mg/dL)    Hgb urine dipstick NEGATIVE  NEGATIVE     Bilirubin Urine NEGATIVE  NEGATIVE     Ketones, ur NEGATIVE  NEGATIVE (mg/dL)    Protein, ur NEGATIVE  NEGATIVE (mg/dL)    Urobilinogen, UA 0.2  0.0 - 1.0 (mg/dL)    Nitrite NEGATIVE  NEGATIVE     Leukocytes, UA NEGATIVE  NEGATIVE  MICROSCOPIC NOT DONE ON URINES WITH NEGATIVE PROTEIN, BLOOD, LEUKOCYTES, NITRITE, OR GLUCOSE <1000 mg/dL.  RPR     Status: Normal   Collection Time   06/28/11  8:22 PM      Component Value Range Comment   RPR NON  REACTIVE  NON REACTIVE    HIV ANTIBODY (ROUTINE TESTING)     Status: Normal   Collection Time   06/28/11  8:22 PM      Component Value Range Comment   HIV NON REACTIVE  NON REACTIVE      Physical Findings: AIMS: CIWA:  CIWA-Ar Total: 0  COWS:  COWS Total Score: 0   Treatment Plan Summary: Daily contact with patient to assess and evaluate symptoms and progress in treatment Medication management  Plan:  1. Continue current plan of care with hope of patient going to [facility] on Monday as her detox is completed.  Kea Callan 06/30/2011, 5:39 PM

## 2011-07-01 DIAGNOSIS — N760 Acute vaginitis: Secondary | ICD-10-CM

## 2011-07-01 DIAGNOSIS — A499 Bacterial infection, unspecified: Secondary | ICD-10-CM

## 2011-07-01 DIAGNOSIS — B9689 Other specified bacterial agents as the cause of diseases classified elsewhere: Secondary | ICD-10-CM

## 2011-07-01 NOTE — Progress Notes (Signed)
Patient ID: Mary [name], female   DOB: [birth year], 46 y.o.   MRN: [id number]  [facility] Group Notes:  (Counselor/Nursing/MHT/Case Management/Adjunct)  07/01/2011 1:15 PM  Type of Therapy:  Group Therapy, Dance/Movement Therapy      Rhunette Croft

## 2011-07-01 NOTE — Progress Notes (Signed)
Patient ID: Mary [name], female   DOB: 12-20-1965, 46 y.o.   MRN: [id number] Pt is asleep in bed this AM. Pt is not attending groups this AM and states that she does not feel well enough. Pt forwards little and her interaction is minimal. Pt denies SI/HI and AVH. Writer will continue to monitor.

## 2011-07-01 NOTE — Progress Notes (Signed)
PATIENT RESTING IN BED WITH EYES CLOSED. RESPIRATIONS EVEN AND NON-LABORED. CHEST RISES AND FALL WITH EACH BREATH. SKIN WARM AND DRY. NO DISTRESS NOTED.

## 2011-07-01 NOTE — Progress Notes (Signed)
Pt sleep most of the day and when not sleep she just stayed in her room. Pt was offered support and encouragement. Pt safety maintained on unit.

## 2011-07-01 NOTE — Progress Notes (Signed)
  Mary [name] is a 46 y.o. female [id number] 26-Sep-1965  06/27/2011 Active Problems:  Polysubstance abuse   Mental Status: Seen in room says this is her worst detox. Mood is depressed denies SI/HI/AVH.   Subjective/Objective: Encouraged to get out of bed and go to group after lunch. Had a a headache and sore throat.    Filed Vitals:   07/01/11 1101  BP: 82/54  Pulse: 91  Temp:   Resp: 18    Lab Results:   BMET    Component Value Date/Time   NA 134* 06/26/2011 0710   K 3.7 06/26/2011 0710   CL 103 06/26/2011 0710   CO2 23 06/26/2011 0710   GLUCOSE 111* 06/26/2011 0710   BUN 10 06/26/2011 0710   CREATININE 0.72 06/26/2011 0710   CALCIUM 10.4 06/26/2011 0710   GFRNONAA >90 06/26/2011 0710   GFRAA >90 06/26/2011 0710    Medications:  Scheduled:     . calcium carbonate  1 tablet Oral Once  . FLUoxetine  20 mg Oral Daily  . metroNIDAZOLE  500 mg Oral Q12H  . OLANZapine  5 mg Oral QHS  . pantoprazole  40 mg Oral Q1200     PRN Meds alum & mag hydroxide-simeth, chlordiazePOXIDE, diphenhydrAMINE, ibuprofen, LORazepam, magnesium hydroxide Plan; continue supported detox            No change to plan .  Chenita Ruda,[name] 07/01/2011

## 2011-07-02 NOTE — Progress Notes (Signed)
  Mary [name] is a 46 y.o. female [id number] [birth year]  06/27/2011 Active Problems:  Polysubstance abuse   Mental Status: Alert & oriented mood is better today denies SI/HI/AVH.    Subjective/Objective: Distressed that her room was sprayed by terminix and she isn't allowed to be in there for the time being. Demanding to be discharged. Says she isn't having withdrawal like yesterday.    Filed Vitals:   07/02/11 0710  BP: 105/70  Pulse: 88  Temp:   Resp:     Lab Results:   BMET    Component Value Date/Time   NA 134* 06/26/2011 0710   K 3.7 06/26/2011 0710   CL 103 06/26/2011 0710   CO2 23 06/26/2011 0710   GLUCOSE 111* 06/26/2011 0710   BUN 10 06/26/2011 0710   CREATININE 0.72 06/26/2011 0710   CALCIUM 10.4 06/26/2011 0710   GFRNONAA >90 06/26/2011 0710   GFRAA >90 06/26/2011 0710    Medications:  Scheduled:     . calcium carbonate  1 tablet Oral Once  . FLUoxetine  20 mg Oral Daily  . OLANZapine  5 mg Oral QHS  . pantoprazole  40 mg Oral Q1200     PRN Meds alum & mag hydroxide-simeth, chlordiazePOXIDE, diphenhydrAMINE, ibuprofen, LORazepam, magnesium hydroxide Plan : Continue current plan of care.             Can go into room now.   Mary [name],Mary [name]. 07/02/2011

## 2011-07-02 NOTE — Progress Notes (Signed)
Currently resting quietly in bed in supine position with eyes closed. Respirations are even and unlabored. No acute distress noted. Safety has been maintained with Q15 minute observation. Will continue current POC.  

## 2011-07-02 NOTE — Progress Notes (Signed)
Pt in group room watching television and interacting with other patients. Pt mildly anxious and asking about what medications she can take to help her sleep. Pt stated "I am the craziest one down here" and laughed. Pt cooperative and appears to be in a good mood. Will continue to monitor pt. Bernie Covey, RN

## 2011-07-02 NOTE — Progress Notes (Signed)
Patient ID: Navia Lindahl, female   DOB: 1965/11/19, 46 y.o.   MRN: [id number]  [facility] Group Notes:  (Counselor/Nursing/MHT/Case Management/Adjunct)  07/02/2011 1:15 PM  Type of Therapy:  Group Therapy, Dance/Movement Therapy   Participation Level:  Did Not Attend   Elea, [place]

## 2011-07-02 NOTE — Progress Notes (Signed)
Pt has been in room and in bed for much of the day today, pt has been unable to participate in various milieu activities today, has mentioned about feeling angry and irritable, pt also has endorsed depression today, pt did request and receive medication for agitation today, has been isolative to room for much of the day today, support and encouragement provided, will continue to monitor

## 2011-07-03 NOTE — Progress Notes (Signed)
[facility] MD Progress Note  07/03/2011 4:17 PM  S/O: Patient seen and evaluated with CM. Chart reviewed. Patient stated that her mood was "ok". Her affect was mood congruent and stable. She denied any current thoughts of self injurious behavior, suicidal ideation or homicidal ideation. There were no auditory or visual hallucinations, paranoia, delusional thought processes, or mania noted.  Thought process was linear and goal directed.  No psychomotor agitation or retardation was noted. Speech was normal rate, tone and volume. Eye contact was good. Judgment and insight are fair.  Patient has been up and limitedly engaged on the unit.  No acute safety concerns reported from team.  Reportedly doing well on current meds.   Sleep:  Number of Hours: 6.25    Vital Signs:Blood pressure 113/72, pulse 92, temperature 97.4 F (36.3 C), temperature source Oral, resp. rate 19, height 5\' 5"  (1.651 m), weight 65.318 kg (144 lb), last menstrual period 06/08/2011.  Lab Results:  No results found for this or any previous visit (from the past 48 hour(s)).  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  Restarted on those meds s/p c/o continued irritability, mood lability, anxiety and difficulty sleeping. Doing well on that combination now.   Medication education completed.  Pros, cons, risks, potential side effects and benefits were discussed with pt.  Pt agreeable with the plan.  See orders.  Discussed with team.  Discharge tomorrow pending, accepted to [facility].  Most likely not going directly to [facility] in light of limited motivation and engagement on the unit.  Lupe Carney 07/03/2011, 4:17 PM

## 2011-07-03 NOTE — Progress Notes (Signed)
[facility] Case Management Discharge Plan:  Will you be returning to the same living situation after discharge: No. At discharge, do you have transportation home?:Yes,  daughter Do you have the ability to pay for your medications:Yes,  mental health  Interagency Information:     Release of information consent forms completed and in the chart;  Patient's signature needed at discharge.  Patient to Follow up at:  Follow-up Information    Follow up with [facility] on 07/14/2011. (8 AM sharp)    Contact information:   [address]  [place]  [phone]      Follow up with [facility] on 07/06/2011. (Walk-in to be opened for services on Thursday, 4/25 at 8 AM.  You will need to go there to get medication for when you go to [facility])    Contact information:   [address]  [place]  [phone]         Patient denies SI/HI:   Yes,  yes    Safety Planning and Suicide Prevention discussed:  Yes,  yes  Barrier to discharge identified:No.  Summary and Recommendations:   Mary [name] 07/03/2011, 3:17 PM

## 2011-07-03 NOTE — Progress Notes (Signed)
[facility] Group Notes:  (Counselor/Nursing/MHT/Case Management/Adjunct)  07/03/2011 12:29 PM  Type of Therapy:  Group Therapy at 11:00  Participation Level:  Did Not Attend  Mary [name] 07/03/2011, 12:29 PM

## 2011-07-03 NOTE — Discharge Planning (Signed)
Found Margie in bed at 8:35.  She agreed to come to group.  Stated she had gone to 3 groups over the weekend.  Others confirmed at least 2 groups, and there was some general laughter about [facility] and groups.  She talked about having no energy and feeling like she is not ready to go.  I empathized by pointing out dopamine depletion and subsequent lethargy.  She became tearful, saying she has thought about leaving to see children, but knows that she will likely relapse if not able to get directly into rehab from here.  Called [facility], but no bed today.  Try again tomorrow.

## 2011-07-03 NOTE — Progress Notes (Signed)
Pt has been in bed most of the day.  She did get up right before lunch.  She did take her shower and remained up for the 1315 group.  She since has been in dayroom interacting with her peers and staff appropriately.  She is planning to discharge home tomorrow.  She has an appointment for [facility] on 07/14/11 at 0800 and [facility] on 07/06/11 walk-in to be opened for services.  She plans to ger her medications and counseling.through [facility].  She rated her depression a 6 denied any hopelessness and her anxiety a 7 on her self-inventory this morning.  She has not requested any prn meds thus far.

## 2011-07-04 DIAGNOSIS — R112 Nausea with vomiting, unspecified: Secondary | ICD-10-CM

## 2011-07-04 DIAGNOSIS — K219 Gastro-esophageal reflux disease without esophagitis: Secondary | ICD-10-CM | POA: Diagnosis present

## 2011-07-04 HISTORY — DX: Nausea with vomiting, unspecified: R11.2

## 2011-07-04 MED ORDER — FLUOXETINE HCL 20 MG PO CAPS
20.0000 mg | ORAL_CAPSULE | Freq: Every day | ORAL | Status: DC
  Filled 2011-07-04 (×2): qty 14

## 2011-07-04 MED ORDER — FLUOXETINE HCL 20 MG PO CAPS: 20.0000 mg | ORAL_CAPSULE | Freq: Every day | ORAL | Status: DC

## 2011-07-04 MED ORDER — OLANZAPINE 5 MG PO TABS
5.0000 mg | ORAL_TABLET | Freq: Every day | ORAL | Status: DC
  Filled 2011-07-04: qty 14

## 2011-07-04 MED ORDER — PANTOPRAZOLE SODIUM 40 MG PO TBEC: 40.0000 mg | DELAYED_RELEASE_TABLET | Freq: Every day | ORAL | Status: DC

## 2011-07-04 MED ORDER — OLANZAPINE 5 MG PO TABS: 5.0000 mg | ORAL_TABLET | Freq: Every day | ORAL | Status: DC

## 2011-07-04 MED ORDER — DIPHENHYDRAMINE HCL 50 MG PO CAPS: ORAL_CAPSULE | ORAL | Status: DC

## 2011-07-04 MED ORDER — PANTOPRAZOLE SODIUM 40 MG PO TBEC
40.0000 mg | DELAYED_RELEASE_TABLET | Freq: Every day | ORAL | Status: DC
  Filled 2011-07-04: qty 14

## 2011-07-04 NOTE — Progress Notes (Signed)
Patient ID: Mary [name], female   DOB: March 29, 1965, 46 y.o.   MRN: [id number] Patient was pleasant and cooperative during the assessment. Pt informed the writer that she's being discharged and plans to continue with treatment, first at [facility] then to [facility]. "I gotta do it this time!" Support and encouragement was offered.

## 2011-07-04 NOTE — Progress Notes (Signed)
Pt was discharged home today. She denied any S/I H/I or A/V hallucinations.  She was given f/u appointment, rx, sample medications, and hotline info booklet. She voiced understanding to all instructions provided.  She declined the need for smoking cessation materials.  
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
## 2011-07-04 NOTE — Progress Notes (Signed)
[facility] Adult Inpatient Family/Significant Other Suicide Prevention Education  Suicide Prevention Education:  Patient Refusal for Family/Significant Other Suicide Prevention Education: The patient Mary [name] has refused on two separate occassions to provide written consent for family/significant other to be provided Family/Significant Other Suicide Prevention Education during admission and/or prior to discharge.  Physician notified.  Writer provided suicide prevention education directly to patient on afternoon of 07/03/11; conversation included risk factors, warning signs and resources to contact for help. Mobile crisis services explained and contact card placed in chart for pt to receive at discharge.   Clide Dales 07/04/2011, 8:57 AM

## 2011-07-04 NOTE — Discharge Summary (Signed)
Physician Discharge Summary Note  Patient:  Mary [name] is an 46 y.o., female MRN:  [id number] DOB:  [birth year] Patient phone:  [phone] (home)  Patient address:   [address] [place] [address],   Date of Admission:  06/27/2011 Date of Discharge: 07/04/2011  Reason for Admission: Polysubstance abuse  Discharge Diagnoses: Active Problems:  Polysubstance abuse  GERD without esophagitis   Discharge Axis Diagnosis:  Discharge Diagnoses:  AXIS I: Cocaine and Cannabis Dependence; Mood Disorder NOS  AXIS II: r/o PD NOS with Cluster B Traits  AXIS III:  Past Medical History   Diagnosis  Date   .  Hernia    .  Hepatitis C    .  Drug abuse and dependence    .  Depression    .  H/O hiatal hernia    AXIS IV: Moderate  AXIS V: 45  Level of Care:  OP  Hospital Course:  Gracilyn was admitted for detox from alcohol, cocaine, and crisis management.  She was treated with Librium 25mg  q 6 hours as needed.  Home medication was restarted as Olanzapine 5mg  po qd, and Fluoxetine 20mg  po qd without side effects.     Improvement was monitored by CIWA/COWS scores and patient's daily report of withdrawal symptom reduction. Emotional and mental status was monitored by daily self inventory reports completed by the patient and clinical staff.      The patient was evaluated by the treatment team for stability and plans for continued recovery upon discharge. He/She was offered further treatment options upon discharge including Residential, IOP, and Outpatient treatment.  The patient's motivation was an integral factor for scheduling further treatment, as she missed groups often and participation in unit programming was minimal.  Upon discharge the patient was both mentally and medically stable for discharge.    Consults:  None  Significant Diagnostic Studies:  None  Discharge Vitals:   Blood pressure 96/64, pulse 82, temperature 98 F (36.7 C), temperature source Oral, resp. rate 20, height  5\' 5"  (1.651 m), weight 65.318 kg (144 lb), last menstrual period 06/08/2011.  Mental Status Exam: See Mental Status Examination and Suicide Risk Assessment completed by Attending Physician prior to discharge.  Discharge destination:  Home plans to follow up at [facility] for possible residential treatment when bed is available. Is patient on multiple antipsychotic therapies at discharge:  No   Has Patient had three or more failed trials of antipsychotic monotherapy by history:  No Recommended Plan for Multiple Antipsychotic Therapies: not applicable Discharge Orders    Future Orders Please Complete By Expires   Diet - low sodium heart healthy      Increase activity slowly      Discharge instructions      Comments:   Take your medication as prescribed.  Take only the medication that is prescribed for you.  Be sure to keep all follow up appointments at [facility] as planned.  Be sure to attend 90 meetings in 90 days to help you maintain your sobriety.     Medication List  As of 07/04/2011 11:41 AM   TAKE these medications         FLUoxetine 20 MG capsule   Commonly known as: PROZAC   Take 1 capsule (20 mg total) by mouth daily. For anxiety and depression.       OLANZapine 5 MG tablet   Commonly known as: ZYPREXA   Take 1 tablet (5 mg total) by mouth at bedtime. For mood stabilization and depression.  pantoprazole 40 MG tablet   Commonly known as: PROTONIX   Take 1 tablet (40 mg total) by mouth daily at 12 noon. For GERD.            Follow-up Information    Follow up with [facility] on 07/14/2011. (8 AM sharp)    Contact information:   [address]  [place]  [phone]      Follow up with [facility] on 07/06/2011. (Walk-in to be opened for services on Thursday, 4/25 at 8 AM.  You will need to go there to get medication for when you go to [facility])    Contact information:   [address]  [place]  [phone]         Follow-up recommendations:  Activity:  as  tolerated Diet:  heart healthy.  Comments:    Signed: Kort Stettler 07/04/2011, 11:41 AM

## 2011-07-04 NOTE — Progress Notes (Signed)
[facility] Group Notes:  (Counselor/Nursing/MHT/Case Management/Adjunct)  07/04/2011 11:51 AM  Type of Therapy:  Group Therapy at 11:00  Participation Level:  Did Not Attend   Mary [name] 07/04/2011, 11:51 AM

## 2011-07-04 NOTE — Treatment Plan (Signed)
Interdisciplinary Treatment Plan Update (Adult)  Date: 07/04/2011  Time Reviewed: 7:51 AM   Progress in Treatment: Attending groups:Sporadically Participating in groups: Yes Taking medication as prescribed: Yes Tolerating medication: Yes   Family/Significant other contact made:  No Patient understands diagnosis:  Yes As evidenced by asking for help with depression and substance abuse Discussing patient identified problems/goals with staff:  Yes See below Medical problems stabilized or resolved:  Yes Denies suicidal/homicidal ideation: Yes  In tx team Issues/concerns per patient self-inventory:  Yes Depression is 4  C/O cravings, agitation Other:  New problem(s) identified: N/A  Reason for Continuation of Hospitalization: Other; describe D/C today  Interventions implemented related to continuation of hospitalization:   Additional comments:  Estimated length of stay: D/C today  Discharge Plan: Stay with daughter until she goes to screening at [facility] rehab.  Follow up [facility] in the meantime  New goal(s): N/A  Review of initial/current patient goals per problem list:   1.  Goal(s): Eliminate SI  Met:  Yes  Target date:4/23  As evidenced by: Self report in tx team  2.  Goal (s): Decrease depression  Met:  Yes  Target date:4/23  As evidenced by: Rating of 5 or less on self inventory [4]  3.  Goal(s): Identify comprehensive sobriety plan  Met:  Yes  Target date:4/23  As evidenced by: Graciella Belton states she will attend daily NA mtgs and go to [place], and then appear for her screening at [facility] rehab  4.  Goal(s):  Met:  Yes  Target date:  As evidenced by:  Attendees: Patient:  Mary [name] 07/04/2011 7:51AM  Family:     Physician:  Lupe Carney 07/04/2011 7:51 AM   Nursing:  Robbie Louis  07/04/2011 7:51 AM   Case Manager:  Richelle Ito, LCSW 07/04/2011 7:51 AM   Counselor:  Ronda Fairly, LCSWA 07/04/2011 7:51 AM   Other:     Other:     Other:      Other:      Scribe for Treatment Team:   Ida Rogue, 07/04/2011 7:51 AM

## 2011-07-04 NOTE — Progress Notes (Signed)
[facility] Group Notes:  (Counselor/Nursing/MHT/Case Management/Adjunct)  07/04/2011 8:52 AM  Type of Therapy:  Group Therapy at 1:15 on 07/03/11  Participation Level:  Minimal  Participation Quality: Minimally  Attentive  Affect:  Flat  Cognitive:  Oriented  Insight:  None shared  Engagement in Group:  Limited  Modes of Intervention:  Education  Summary of Progress/Problems:  Mary [name] came in late to group and was minimally attentive to [employer] [facility] [employer] [facility] [place] presentation by Myla. Patient appeared bored and focused on clock verses information presented.    Clide Dales 07/04/2011, 8:52 AM

## 2011-07-07 NOTE — Progress Notes (Signed)
Patient Discharge Instructions:  Psychiatric Admission Assessment Note Provided,  07/06/2011 After Visit Summary (AVS) Provided,  07/06/2011 Face Sheet Provided, 07/06/2011 Faxed/Sent to the Next Level Care provider:  07/06/2011 Provided Suicide Risk Assessment - Discharge Assessment 07/06/2011  Faxed to [facility] @ [phone]  Wandra Scot, 07/07/2011, 1:49 PM

## 2011-10-10 ENCOUNTER — Emergency Department (HOSPITAL_COMMUNITY)
Admission: EM | Admit: 2011-10-10 | Discharge: 2011-10-11 | Disposition: A | Discharge status: Other Acute Inpatient Hospital | Payer: Self-pay | Attending: Emergency Medicine | Admitting: Emergency Medicine

## 2011-10-10 ENCOUNTER — Encounter (HOSPITAL_COMMUNITY): Payer: Self-pay | Admitting: *Deleted

## 2011-10-10 DIAGNOSIS — R45851 Suicidal ideations: Secondary | ICD-10-CM | POA: Insufficient documentation

## 2011-10-10 DIAGNOSIS — A539 Syphilis, unspecified: Secondary | ICD-10-CM | POA: Insufficient documentation

## 2011-10-10 DIAGNOSIS — F32A Depression, unspecified: Secondary | ICD-10-CM

## 2011-10-10 DIAGNOSIS — F1910 Other psychoactive substance abuse, uncomplicated: Secondary | ICD-10-CM | POA: Insufficient documentation

## 2011-10-10 DIAGNOSIS — F17200 Nicotine dependence, unspecified, uncomplicated: Secondary | ICD-10-CM | POA: Insufficient documentation

## 2011-10-10 DIAGNOSIS — F329 Major depressive disorder, single episode, unspecified: Secondary | ICD-10-CM | POA: Insufficient documentation

## 2011-10-10 DIAGNOSIS — F3289 Other specified depressive episodes: Secondary | ICD-10-CM | POA: Insufficient documentation

## 2011-10-10 LAB — WET PREP, GENITAL

## 2011-10-10 LAB — RAPID URINE DRUG SCREEN, HOSP PERFORMED
Amphetamines: NOT DETECTED
Barbiturates: NOT DETECTED
Benzodiazepines: NOT DETECTED
Cocaine: POSITIVE — AB
Opiates: NOT DETECTED
Tetrahydrocannabinol: POSITIVE — AB

## 2011-10-10 LAB — CBC
MCH: 31.4 pg (ref 26.0–34.0)
MCV: 89.7 fL (ref 78.0–100.0)
Platelets: 305 10*3/uL (ref 150–400)
RBC: 4.39 MIL/uL (ref 3.87–5.11)
RDW: 13.1 % (ref 11.5–15.5)
WBC: 7.8 10*3/uL (ref 4.0–10.5)

## 2011-10-10 LAB — COMPREHENSIVE METABOLIC PANEL
ALT: 13 U/L (ref 0–35)
AST: 16 U/L (ref 0–37)
Albumin: 3.3 g/dL — ABNORMAL LOW (ref 3.5–5.2)
CO2: 28 mEq/L (ref 19–32)
Calcium: 10.3 mg/dL (ref 8.4–10.5)
Creatinine, Ser: 0.81 mg/dL (ref 0.50–1.10)
GFR calc non Af Amer: 85 mL/min — ABNORMAL LOW (ref >90)
Sodium: 135 mEq/L (ref 135–145)

## 2011-10-10 LAB — URINALYSIS, ROUTINE W REFLEX MICROSCOPIC
Bilirubin Urine: NEGATIVE
Hgb urine dipstick: NEGATIVE
Ketones, ur: NEGATIVE mg/dL
Nitrite: NEGATIVE
Protein, ur: NEGATIVE mg/dL
Urobilinogen, UA: 0.2 mg/dL (ref 0.0–1.0)

## 2011-10-10 MED ORDER — ALUM & MAG HYDROXIDE-SIMETH 200-200-20 MG/5ML PO SUSP: 30.0000 mL | ORAL | Status: DC | PRN

## 2011-10-10 MED ORDER — DIBUCAINE 1 % RE OINT
TOPICAL_OINTMENT | RECTAL | Status: DC | PRN
  Administered 2011-10-10: 21:00:00 via RECTAL
  Filled 2011-10-10: qty 28

## 2011-10-10 MED ORDER — IBUPROFEN 600 MG PO TABS: 600.0000 mg | ORAL_TABLET | Freq: Three times a day (TID) | ORAL | Status: DC | PRN

## 2011-10-10 MED ORDER — NICOTINE 21 MG/24HR TD PT24
21.0000 mg | MEDICATED_PATCH | Freq: Once | TRANSDERMAL | Status: DC
  Administered 2011-10-10: 21 mg via TRANSDERMAL
  Filled 2011-10-10: qty 1

## 2011-10-10 MED ORDER — ACYCLOVIR 400 MG PO TABS
800.0000 mg | ORAL_TABLET | Freq: Three times a day (TID) | ORAL | Status: DC
  Administered 2011-10-10 – 2011-10-11 (×3): 800 mg via ORAL
  Filled 2011-10-10 (×5): qty 2

## 2011-10-10 MED ORDER — ACYCLOVIR 200 MG PO CAPS
800.0000 mg | ORAL_CAPSULE | Freq: Three times a day (TID) | ORAL | Status: DC
  Filled 2011-10-10 (×2): qty 4

## 2011-10-10 MED ORDER — ZOLPIDEM TARTRATE 5 MG PO TABS: 5.0000 mg | ORAL_TABLET | Freq: Every evening | ORAL | Status: DC | PRN

## 2011-10-10 MED ORDER — ONDANSETRON HCL 4 MG PO TABS: 4.0000 mg | ORAL_TABLET | Freq: Three times a day (TID) | ORAL | Status: DC | PRN

## 2011-10-10 MED ORDER — LORAZEPAM 1 MG PO TABS: 1.0000 mg | ORAL_TABLET | Freq: Three times a day (TID) | ORAL | Status: DC | PRN

## 2011-10-10 NOTE — ED Notes (Signed)
Report given to Janie RN
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
## 2011-10-10 NOTE — ED Notes (Signed)
Up to the bathroom 

## 2011-10-10 NOTE — ED Notes (Addendum)
Pt presents requesting help for crack addiction, last use early this am; usually smokes at least $400/day. Also reports having test done [facility] 2 weeks ago, and was called back, told "her syphilis counts were high." Vaginal d/c, brown, x17months. Vaginal itching. Hx prostitution. Also reports SI with plan to OD on OTC meds. Denies HI.

## 2011-10-10 NOTE — ED Notes (Signed)
Judeth Cornfield, [place] stated move pt to psych ED.

## 2011-10-10 NOTE — ED Provider Notes (Signed)
History     CSN: [id number]  Arrival date & time 10/10/11  1506   First MD Initiated Contact with Patient 10/10/11 1541      Chief Complaint  Patient presents with  . V70.1    suicidal  . Addiction Problem    wants treatment for crack addiction  . SEXUALLY TRANSMITTED DISEASE    syphilis    (Consider location/radiation/quality/duration/timing/severity/associated sxs/prior treatment) Patient is a 46 y.o. female presenting with mental health disorder and vaginal discharge. The history is provided by the patient.  Mental Health Problem The primary symptoms include dysphoric mood. This is a recurrent problem.  The mood includes feelings of sadness and irritability.  The onset of the illness is precipitated by drug abuse and emotional stress. The degree of incapacity that she is experiencing as a consequence of her illness is severe. Sequelae of the illness include an inability to work, harmed interpersonal relations and homelessness. Additional symptoms of the illness include anhedonia, feelings of worthlessness and poor judgment. She admits to suicidal ideas. She does have a plan to commit suicide. She contemplates harming herself. She has not already injured self. She does not contemplate injuring another person. She has not already  injured another person. Risk factors that are present for mental illness include a history of mental illness and substance abuse.  Vaginal Discharge This is a new problem.  White/brown alternating vaginal d/c assoc with itching and burning for unknown duration. No fever, dysuria, abd pain. No prior tx. No aggravating or alleviating factors. Apparently had HIV and syphilis testing done in the community by a health department outreach professional, was called and told her syphilis test was positive, would like tx for this. Denies any rash.  Past Medical History  Diagnosis Date  . Hernia   . Hepatitis C   . Drug abuse and dependence   . Depression   . H/O  hiatal hernia     Past Surgical History  Procedure Date  . Splenectomy   . Tubal ligation     No family history on file.  History  Substance Use Topics  . Smoking status: Current Everyday Smoker -- 1.0 packs/day for 20 years    Types: Cigarettes  . Smokeless tobacco: Not on file  . Alcohol Use: Yes     1 pt daily     Review of Systems  Genitourinary: Positive for vaginal discharge, genital sores and vaginal pain.  Psychiatric/Behavioral: Positive for suicidal ideas and dysphoric mood.  10 systems reviewed and are otherwise negative for acute change except as noted in the HPI.   Allergies  Review of patient's allergies indicates no known allergies.  Home Medications  No current outpatient prescriptions on file.  BP 98/59  Pulse 66  Temp 98 F (36.7 C) (Oral)  Resp 15  SpO2 100%  Physical Exam  Constitutional: She appears well-developed and well-nourished. No distress.       Vital signs are reviewed and are normal. Anxious-appearing.  HENT:  Head: Normocephalic and atraumatic.  Right Ear: External ear normal.       MMM  Eyes: Conjunctivae are normal.  Neck: Neck supple.  Cardiovascular: Normal rate and regular rhythm.   Pulmonary/Chest: Effort normal. No respiratory distress.  Abdominal: Soft. She exhibits no distension. There is no tenderness.  Genitourinary:    There is no rash, tenderness or lesion on the right labia. There is no rash, tenderness or lesion on the left labia. Uterus is not tender. Cervix exhibits no motion tenderness  and no friability. Right adnexum displays no tenderness. Left adnexum displays no tenderness. No tenderness or bleeding around the vagina. No foreign body around the vagina. No signs of injury around the vagina. Vaginal discharge found.  Musculoskeletal: She exhibits no edema.  Neurological: She is alert.       MS appears baseline for pt and situation  Skin: Skin is warm and dry.  Psychiatric: Her mood appears anxious. She is  not actively hallucinating. She exhibits a depressed mood. She expresses suicidal ideation. She expresses no homicidal ideation. She expresses suicidal plans. She expresses no homicidal plans.    ED Course  Procedures (including critical care time)  Labs Reviewed  URINE RAPID DRUG SCREEN (HOSP PERFORMED) - Abnormal; Notable for the following:    Cocaine POSITIVE (*)     Tetrahydrocannabinol POSITIVE (*)     All other components within normal limits  CBC  URINALYSIS, ROUTINE W REFLEX MICROSCOPIC  PREGNANCY, URINE  COMPREHENSIVE METABOLIC PANEL  ETHANOL  RPR  WET PREP, GENITAL  GC/CHLAMYDIA PROBE AMP, GENITAL   No results found.      MDM  Suicidal ideation, substance abuse. ACT to see.  RPR pending. Wet prep pending. Acyclovir ordered for genital herpes recurrence (as pt does report a hx of same when questioned on exam). Dibucaine for pain relief.  7:58 PM EDP to f/u on RPR result. ACT assessment pending.  Shaaron Adler, [place] 10/10/11 1958

## 2011-10-10 NOTE — ED Notes (Signed)
Act into see 

## 2011-10-11 ENCOUNTER — Encounter (HOSPITAL_COMMUNITY): Payer: Self-pay

## 2011-10-11 ENCOUNTER — Inpatient Hospital Stay (HOSPITAL_COMMUNITY)
Admission: AD | Admit: 2011-10-11 | Discharge: 2011-10-17 | DRG: 897 | Disposition: A | Discharge status: Home / Self Care | Payer: Federal, State, Local not specified - Other | Attending: Psychiatry | Admitting: Psychiatry

## 2011-10-11 DIAGNOSIS — A539 Syphilis, unspecified: Secondary | ICD-10-CM | POA: Diagnosis present

## 2011-10-11 DIAGNOSIS — A499 Bacterial infection, unspecified: Secondary | ICD-10-CM | POA: Diagnosis present

## 2011-10-11 DIAGNOSIS — N760 Acute vaginitis: Secondary | ICD-10-CM | POA: Diagnosis present

## 2011-10-11 DIAGNOSIS — F1910 Other psychoactive substance abuse, uncomplicated: Secondary | ICD-10-CM

## 2011-10-11 DIAGNOSIS — B9689 Other specified bacterial agents as the cause of diseases classified elsewhere: Secondary | ICD-10-CM | POA: Diagnosis present

## 2011-10-11 DIAGNOSIS — A6000 Herpesviral infection of urogenital system, unspecified: Secondary | ICD-10-CM | POA: Diagnosis present

## 2011-10-11 DIAGNOSIS — F1920 Other psychoactive substance dependence, uncomplicated: Principal | ICD-10-CM | POA: Diagnosis present

## 2011-10-11 DIAGNOSIS — B1920 Unspecified viral hepatitis C without hepatic coma: Secondary | ICD-10-CM | POA: Diagnosis present

## 2011-10-11 DIAGNOSIS — F1994 Other psychoactive substance use, unspecified with psychoactive substance-induced mood disorder: Secondary | ICD-10-CM | POA: Diagnosis present

## 2011-10-11 DIAGNOSIS — K219 Gastro-esophageal reflux disease without esophagitis: Secondary | ICD-10-CM | POA: Diagnosis present

## 2011-10-11 LAB — GC/CHLAMYDIA PROBE AMP, GENITAL
Chlamydia, DNA Probe: NEGATIVE
GC Probe Amp, Genital: NEGATIVE

## 2011-10-11 LAB — RPR
RPR Ser Ql: REACTIVE — AB
RPR Ser Ql: REACTIVE — AB

## 2011-10-11 LAB — RPR TITER
RPR Titer: 1:64 {titer} — AB
RPR Titer: 1:16 {titer} — AB

## 2011-10-11 LAB — T.PALLIDUM AB, IGG: T pallidum Antibodies (TP-PA): 7.38 S/CO — ABNORMAL HIGH (ref <0.90)

## 2011-10-11 MED ORDER — ONDANSETRON 4 MG PO TBDP: 4.0000 mg | ORAL_TABLET | Freq: Four times a day (QID) | ORAL | Status: AC | PRN

## 2011-10-11 MED ORDER — CHLORDIAZEPOXIDE HCL 25 MG PO CAPS: 25.0000 mg | ORAL_CAPSULE | Freq: Four times a day (QID) | ORAL | Status: DC | PRN

## 2011-10-11 MED ORDER — CHLORDIAZEPOXIDE HCL 25 MG PO CAPS: 25.0000 mg | ORAL_CAPSULE | Freq: Three times a day (TID) | ORAL | Status: DC

## 2011-10-11 MED ORDER — LOPERAMIDE HCL 2 MG PO CAPS: 2.0000 mg | ORAL_CAPSULE | ORAL | Status: AC | PRN

## 2011-10-11 MED ORDER — CHLORDIAZEPOXIDE HCL 25 MG PO CAPS: 25.0000 mg | ORAL_CAPSULE | Freq: Every day | ORAL | Status: DC

## 2011-10-11 MED ORDER — ACYCLOVIR 5 % EX CREA
TOPICAL_CREAM | CUTANEOUS | Status: DC
  Filled 2011-10-11 (×2): qty 5

## 2011-10-11 MED ORDER — ACYCLOVIR 5 % EX CREA
TOPICAL_CREAM | CUTANEOUS | Status: DC
  Filled 2011-10-11: qty 5

## 2011-10-11 MED ORDER — ACETAMINOPHEN 325 MG PO TABS: 650.0000 mg | ORAL_TABLET | Freq: Four times a day (QID) | ORAL | Status: DC | PRN

## 2011-10-11 MED ORDER — HYDROXYZINE HCL 50 MG PO TABS
50.0000 mg | ORAL_TABLET | Freq: Every evening | ORAL | Status: DC | PRN
  Administered 2011-10-11: 50 mg via ORAL

## 2011-10-11 MED ORDER — CHLORDIAZEPOXIDE HCL 25 MG PO CAPS
25.0000 mg | ORAL_CAPSULE | Freq: Four times a day (QID) | ORAL | Status: DC
  Administered 2011-10-11 – 2011-10-12 (×3): 25 mg via ORAL
  Filled 2011-10-11 (×3): qty 1

## 2011-10-11 MED ORDER — NICOTINE 21 MG/24HR TD PT24
21.0000 mg | MEDICATED_PATCH | Freq: Every day | TRANSDERMAL | Status: DC
  Filled 2011-10-11 (×8): qty 1

## 2011-10-11 MED ORDER — ALUM & MAG HYDROXIDE-SIMETH 200-200-20 MG/5ML PO SUSP: 30.0000 mL | ORAL | Status: DC | PRN

## 2011-10-11 MED ORDER — HYDROXYZINE HCL 25 MG PO TABS: 25.0000 mg | ORAL_TABLET | Freq: Four times a day (QID) | ORAL | Status: DC | PRN

## 2011-10-11 MED ORDER — CHLORDIAZEPOXIDE HCL 25 MG PO CAPS
25.0000 mg | ORAL_CAPSULE | Freq: Once | ORAL | Status: AC
  Administered 2011-10-11: 25 mg via ORAL
  Filled 2011-10-11: qty 1

## 2011-10-11 MED ORDER — FLUOXETINE HCL 20 MG PO CAPS
20.0000 mg | ORAL_CAPSULE | Freq: Every day | ORAL | Status: DC
  Administered 2011-10-12 – 2011-10-16 (×5): 20 mg via ORAL
  Filled 2011-10-11 (×4): qty 1
  Filled 2011-10-11: qty 14
  Filled 2011-10-11 (×2): qty 1

## 2011-10-11 MED ORDER — HYDROXYZINE HCL 50 MG PO TABS: 50.0000 mg | ORAL_TABLET | Freq: Every evening | ORAL | Status: DC | PRN

## 2011-10-11 MED ORDER — VITAMIN B-1 100 MG PO TABS
100.0000 mg | ORAL_TABLET | Freq: Every day | ORAL | Status: DC
Start: 2011-10-12 — End: 2011-10-17
  Administered 2011-10-12 – 2011-10-16 (×5): 100 mg via ORAL
  Filled 2011-10-11 (×7): qty 1

## 2011-10-11 MED ORDER — CHLORDIAZEPOXIDE HCL 25 MG PO CAPS: 25.0000 mg | ORAL_CAPSULE | ORAL | Status: DC

## 2011-10-11 MED ORDER — PANTOPRAZOLE SODIUM 20 MG PO TBEC
20.0000 mg | DELAYED_RELEASE_TABLET | Freq: Every day | ORAL | Status: DC
  Administered 2011-10-12 – 2011-10-16 (×5): 20 mg via ORAL
  Filled 2011-10-11 (×3): qty 1
  Filled 2011-10-11: qty 14
  Filled 2011-10-11 (×2): qty 1

## 2011-10-11 MED ORDER — ACYCLOVIR 5 % EX OINT
TOPICAL_OINTMENT | CUTANEOUS | Status: DC
  Administered 2011-10-11: 22:00:00 via TOPICAL
  Administered 2011-10-12 (×2): 1 via TOPICAL
  Administered 2011-10-12: 21:00:00 via TOPICAL
  Administered 2011-10-12: 1 via TOPICAL
  Administered 2011-10-12 – 2011-10-13 (×5): via TOPICAL
  Administered 2011-10-13: 1 via TOPICAL
  Administered 2011-10-14 (×4): via TOPICAL
  Administered 2011-10-14: 1 via TOPICAL
  Administered 2011-10-14 – 2011-10-15 (×3): via TOPICAL
  Administered 2011-10-15: 1 via TOPICAL
  Administered 2011-10-15 – 2011-10-16 (×8): via TOPICAL
  Filled 2011-10-11 (×2): qty 30

## 2011-10-11 MED ORDER — PENICILLIN G BENZATHINE 1200000 UNIT/2ML IM SUSP
2.4000 10*6.[IU] | Freq: Once | INTRAMUSCULAR | Status: AC
  Administered 2011-10-11: 2.4 10*6.[IU] via INTRAMUSCULAR
  Filled 2011-10-11 (×2): qty 2

## 2011-10-11 MED ORDER — ADULT MULTIVITAMIN W/MINERALS CH
1.0000 | ORAL_TABLET | Freq: Every day | ORAL | Status: DC
  Administered 2011-10-11 – 2011-10-16 (×6): 1 via ORAL
  Filled 2011-10-11 (×8): qty 1

## 2011-10-11 MED ORDER — THIAMINE HCL 100 MG/ML IJ SOLN
100.0000 mg | Freq: Once | INTRAMUSCULAR | Status: AC
  Administered 2011-10-11: 100 mg via INTRAMUSCULAR

## 2011-10-11 MED ORDER — MAGNESIUM HYDROXIDE 400 MG/5ML PO SUSP: 30.0000 mL | Freq: Every day | ORAL | Status: DC | PRN

## 2011-10-11 NOTE — ED Provider Notes (Signed)
Pt up showering this am.  No distress.  Filed Vitals:   10/11/11 0517  BP: 114/67  Pulse: 67  Temp: 98.5 F (36.9 C)  Resp: 18   Awaiting psych dispo  Celene Kras, MD 10/11/11 [phone]

## 2011-10-11 NOTE — B[facility] Counselor (Signed)
Mary [name], assessment counselor at [facility], submitted Pt for admission to [facility]. Consulted with Akeysha McMurren, AC who said an appropriate bed is not available at this time. Gave clinical report to Dr. Phyllis Bogard who accepted Pt to [facility] pending an appropriate bed.  Kaleena Corrow Ellis Janey Petron Jr, LPC 

## 2011-10-11 NOTE — ED Provider Notes (Signed)
Pt has been accepted to [facility] by Dr. Catha Brow.    Nat Christen, MD 10/11/11 1640
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
## 2011-10-11 NOTE — Tx Team (Signed)
Initial Interdisciplinary Treatment Plan  PATIENT STRENGTHS: (choose at least two) Average or above average intelligence General fund of knowledge  PATIENT STRESSORS: Financial difficulties Health problems Medication change or noncompliance Occupational concerns Substance abuse   PROBLEM LIST: Problem List/Patient Goals Date to be addressed Date deferred Reason deferred Estimated date of resolution  Poly substance dependence                                                       DISCHARGE CRITERIA:  Ability to meet basic life and health needs Adequate post-discharge living arrangements Improved stabilization in mood, thinking, and/or behavior Verbal commitment to aftercare and medication compliance  PRELIMINARY DISCHARGE PLAN: Attend 12-step recovery group Placement in alternative living arrangements  PATIENT/FAMIILY INVOLVEMENT: This treatment plan has been presented to and reviewed with the patient, Mary [name], and/or family member,   The patient and family have been given the opportunity to ask questions and make suggestions.  Andrena Mews 10/11/2011, 6:09 PM
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
## 2011-10-11 NOTE — ED Provider Notes (Signed)
Medical screening examination/treatment/procedure(s) were performed by non-physician practitioner and as supervising physician I was immediately available for consultation/collaboration.  Cheri Guppy, MD 10/11/11 [phone]

## 2011-10-11 NOTE — Progress Notes (Signed)
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

## 2011-10-12 MED ORDER — IBUPROFEN 800 MG PO TABS
800.0000 mg | ORAL_TABLET | Freq: Three times a day (TID) | ORAL | Status: DC | PRN
  Administered 2011-10-12 – 2011-10-16 (×3): 800 mg via ORAL
  Filled 2011-10-12 (×3): qty 1

## 2011-10-12 MED ORDER — OLANZAPINE 5 MG PO TABS
5.0000 mg | ORAL_TABLET | Freq: Every day | ORAL | Status: DC
  Administered 2011-10-12 – 2011-10-16 (×5): 5 mg via ORAL
  Filled 2011-10-12: qty 14
  Filled 2011-10-12 (×5): qty 1

## 2011-10-12 MED ORDER — METRONIDAZOLE 0.75 % VA GEL
1.0000 | Freq: Two times a day (BID) | VAGINAL | Status: DC
  Administered 2011-10-12 – 2011-10-16 (×7): 1 via VAGINAL
  Filled 2011-10-12 (×3): qty 70

## 2011-10-12 MED ORDER — VALACYCLOVIR HCL 500 MG PO TABS
1000.0000 mg | ORAL_TABLET | Freq: Two times a day (BID) | ORAL | Status: DC
  Administered 2011-10-12 – 2011-10-16 (×9): 1000 mg via ORAL
  Filled 2011-10-12: qty 2
  Filled 2011-10-12 (×2): qty 56
  Filled 2011-10-12 (×10): qty 2

## 2011-10-12 MED ORDER — HYDROXYZINE HCL 25 MG PO TABS
25.0000 mg | ORAL_TABLET | Freq: Every evening | ORAL | Status: DC | PRN
  Administered 2011-10-12 – 2011-10-16 (×3): 25 mg via ORAL
  Filled 2011-10-12: qty 1
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
## 2011-10-12 NOTE — Progress Notes (Signed)
D: Pt in bed resting with eyes closed. Respirations even and unlabored. Pt appears to be in no signs of distress at this time. A: Q15min checks remains for this pt. R: Pt remains safe at this time.   

## 2011-10-12 NOTE — Progress Notes (Signed)
[facility] Group Notes:  (Counselor/Nursing/MHT/Case Management/Adjunct)  10/12/2011 3:01 PM  Type of Therapy:  Group Therapy  Participation Level:  Did Not Attend  Clide Dales 10/12/2011, 3:01 PM

## 2011-10-12 NOTE — Progress Notes (Signed)
Psychoeducational Group Note  Date:  10/12/2011 Time:  1100  Group Topic/Focus:  Rediscovering Joy:   The focus of this group is to explore various ways to relieve stress in a positive manner.  Participation Level:  Did Not Attend  Participation Quality:    Affect:    Cognitive:    Insight:    Engagement in Group:    Additional Comments:  Pt was sleep, pt just got here and was excused by the RN.   Isla Pence M 10/12/2011, 3:27 PM

## 2011-10-12 NOTE — Progress Notes (Signed)
D: Pt denies SI/HI/AV. Pt is pleasant and cooperative. Pt rates depression at a 3 and Helplessness/hopelessness at a 3. Pt does complain of all over body pain but did not want anything for it.  A: Pt was offered support and encouragement. Pt was given scheduled medications. Pt was encourage to attend groups. Q 15 minute checks were done for safety.  R:Pt attends groups and interacts well with peers and staff. Pt  taking medication.Pt receptive to treatment and safety maintained on unit.

## 2011-10-12 NOTE — H&P (Signed)
Pt seen and evaluated upon admission.  Completed Admission Suicide Risk Assessment.  See orders.  Pt agreeable with plan.  Discussed with team.   

## 2011-10-12 NOTE — Progress Notes (Signed)
Adult Psychosocial Assessment Update Interdisciplinary Team  Previous [facility] admissions/discharges:  Admissions Discharges  Date: 06/27/11  Date:  Date:  06/03/11 Date:  Date: Date:  Date: Date:  Date: Date:   Changes since the last Psychosocial Assessment (including adherence to outpatient mental health and/or substance abuse treatment, situational issues contributing to decompensation and/or relapse).   Patient rep[orts she did not do well on previous psychotropic medications and stopped    using them.  Patient currently using $400 of crack cocaine daily along with 1 liter of    alcohol every 2-3 days.  Patient also reports current stressors include court date of 8/8   for paraphenial charges, homelessness and lack of supports       Discharge Plan 1. Will you be returning to the same living situation after discharge?   Yes: No:   X   If no, what is your plan?     Perhaps a sober living environment such as [facility]       2. Would you like a referral for services when you are discharged? Yes: X    If yes, for what services?  No:        Perhaps a sober living environment or extended treatment program       Summary and Recommendations (to be completed by the evaluator)  Patient is 46 YO female admitted with diagnosis of Major Depressive Disorder and    Polysubstance Abuse.  This is patient's third admit in 6 months.  Patient's current    stressors include upcoming court date, lack of supports, homelessness and health   concerns. Patient will benefit from crisis stabilization, medication evaluation, group therapy and psycho ed groups, in addition to case management for discharge planning.                  Signature:  Clide Dales, 10/12/2011 9:20AM

## 2011-10-12 NOTE — Progress Notes (Signed)
10/12/2011         Time: 1500      Group Topic/Focus: The focus of this group is on enhancing the patient's understanding of leisure, barriers to leisure, and the importance of engaging in positive leisure activities upon discharge for improved total health.  Participation Level: Did not attend  Participation Quality: Not Applicable  Affect: Not Applicable  Cognitive: Not Applicable   Additional Comments: Patient sleeping, reports not feeling well.    Ayza Ripoll 10/12/2011 3:46 PM

## 2011-10-12 NOTE — Progress Notes (Signed)
D:  Patient stayed in bed this morning and did not attend the first 2 groups.  States she is having a lot of discomfort in her perineal area and it is difficult for her to sit.  She has been given permission to carry a pillow with her to sit on.  She denies suicidal ideation and rates her depression and hopelessness at a 3 out of 10. A:  Medications given per orders.  Patient has been encouraged to attend groups and is allowed to carry a pillow in with her to sit on.   R:  Tolerating medications as ordered.  Interacting well with staff and peers.  Has been out of room more since about noon.

## 2011-10-13 DIAGNOSIS — B9689 Other specified bacterial agents as the cause of diseases classified elsewhere: Secondary | ICD-10-CM

## 2011-10-13 DIAGNOSIS — A499 Bacterial infection, unspecified: Secondary | ICD-10-CM

## 2011-10-13 DIAGNOSIS — N760 Acute vaginitis: Secondary | ICD-10-CM

## 2011-10-13 MED ORDER — METRONIDAZOLE 0.75 % VA GEL
1.0000 | Freq: Once | VAGINAL | Status: AC
  Administered 2011-10-13: 1 via VAGINAL

## 2011-10-13 NOTE — Progress Notes (Signed)
D-Patient did not complete patient inventory this shift.  Isolative to room this shift. A- No group attendance this shift due to intense perineal pain. Denies SI. Appropriate participation in treatment team but is not yet showing initiative for recovery. R- Continue to support and encourage. Introduce 12 step concepts.  Continue current POC and evaluation of treatment goals. Continue 15' checks for safety.

## 2011-10-13 NOTE — Progress Notes (Signed)
[facility] Group Notes:  (Counselor/Nursing/MHT/Case Management/Adjunct)  10/13/2011 11:54 AM  Type of Therapy:  Psychoeducational Skills  Participation Level:  Did Not Attend  Summary of Progress/Problems: Patient did not attend group.   Ardelle Park O 10/13/2011, 11:54 AM

## 2011-10-13 NOTE — Progress Notes (Signed)
[facility] Group Notes:  (Counselor/Nursing/MHT/Case Management/Adjunct)  10/13/2011 6:53 PM  Type of Therapy:  Group Therapy  Participation Level:  Did Not Attend   Mary [name] 10/13/2011, 6:53 PM

## 2011-10-13 NOTE — Progress Notes (Signed)
[facility] MD Progress Note  10/13/2011 8:56 PM  S/O: Patient seen and evaluated. Chart reviewed. Patient stated that her mood was "in pain". Sig discomfort noted with herpetic outbreak. Her affect was mood congruent and anxious. She denied any current thoughts of self injurious behavior, suicidal ideation or homicidal ideation. There were no auditory or visual hallucinations, paranoia, delusional thought processes, or mania noted. Thought process was linear and goal directed. No psychomotor agitation or retardation was noted. Speech was normal rate, tone and volume. Eye contact was good. Judgment and insight are fair. No acute safety concerns reported from team.  Sleep:  Number of Hours: 6.5    Vital Signs:Blood pressure 92/57, pulse 91, temperature 97.8 F (36.6 C), temperature source Oral, resp. rate 12, height 5' 4.5" (1.638 m), weight 61.236 kg (135 lb).  Current Medications:     . acyclovir ointment   Topical Q3H while awake  . FLUoxetine  20 mg Oral Daily  . metroNIDAZOLE  1 Applicatorful Vaginal BID  . multivitamin with minerals  1 tablet Oral Daily  . nicotine  21 mg Transdermal Q0600  . OLANZapine  5 mg Oral QHS  . pantoprazole  20 mg Oral Q1200  . thiamine  100 mg Oral Daily  . valACYclovir  1,000 mg Oral BID    Lab Results: No results found for this or any previous visit (from the past 48 hour(s)).  Physical Findings: AIMS: Facial and Oral Movements Muscles of Facial Expression: None, normal Lips and Perioral Area: None, normal Jaw: None, normal Tongue: None, normal,Extremity Movements Upper (arms, wrists, hands, fingers): None, normal Lower (legs, knees, ankles, toes): None, normal, Trunk Movements Neck, shoulders, hips: None, normal, Overall Severity Severity of abnormal movements (highest score from questions above): None, normal Incapacitation due to abnormal movements: None, normal Patient's awareness of abnormal movements (rate only patient's report): No Awareness,  Dental Status Current problems with teeth and/or dentures?: No Does patient usually wear dentures?: No  CIWA:  CIWA-Ar Total: 3  COWS:  COWS Total Score: 2   CLINICAL FACTORS: Cocaine Use Disorder; Cannabis Use Disorder; Syphilis; Genital Herpes; BV; Mood Disorder Unspecified; r/o PD NOS with cluster B Traits; HepC  Plan: Pt seen and evaluated in treatment team.  Reviewed short term and long term goals, medications, current treatment in the hospital and acute/chronic safety.  Pt denied any current thoughts of self harm, suicidal ideation or homicidal ideation.  Contracted for safety on the unit.  No acute issues noted other than pain.  VS reviewed with team.  Pt agreeable with treatment plan, see orders. Continue current medications as noted above with topical Tx per nursing for herpetic outbreak.   Lupe Carney 10/13/2011, 8:56 PM

## 2011-10-13 NOTE — Progress Notes (Signed)
D: Pt denies SI/HI/AVH. Pt rates her hopelessness as 3, depression as 0, and anxiety as 8. Pt states that she is unsure of what if causing her to feel anxious; however she states that it is normal for her. She states that prior to coming in she was not eating much because she was focused on using drugs instead. Pt stated that she was hungry. Salad given. Pt states that the tea compresses have helped with her pain. Pt attended night group. A: Support and encouragement offered to pt. Q 15 min checks continued for safety. R: Pt receptive. Pt remains safe.

## 2011-10-13 NOTE — Tx Team (Signed)
Interdisciplinary Treatment Plan Update (Adult)  Date:  10/13/2011 Time Reviewed:  11:05 AM  Progress in Treatment: Attending groups: Yes Participating in groups:  Yes Taking medication as prescribed: Yes Tolerating medication:  Yes Family/Significant other contact made:  Counselor exploring Patient understands diagnosis:  Yes Discussing patient identified problems/goals with staff:  Yes Medical problems stabilized or resolved:  Yes Denies suicidal/homicidal ideation: Yes Issues/concerns per patient self-inventory:  None identified Other: N/A  New problem(s) identified: None Identified  Reason for Continuation of Hospitalization: Depression Medical Issues Suicidal ideation Withdrawal symptoms  Interventions implemented related to continuation of hospitalization: mood stabilization, medication monitoring and adjustment, group therapy and psycho education, safety checks q 15 mins  Additional comments: N/A  Estimated length of stay: 3-5 days  Discharge Plan: Exploring [facility] inpatient treatment programs. CM to call Richelle Ito at [facility] on Monday to see if they have projected discharges.  New goal(s): N/A  Review of initial/current patient goals per problem list:    1.  Goal(s): Address substance use  Met:  No  Target date: by discharge  As evidenced by: completing detox protocol and refer to appropriate treatment  2.  Goal (s): Reduce depressive symptoms  Met:  No  Target date: by discharge  As evidenced by: Reducing depression from a 10 to a 3 as reported by pt.     Attendees: Patient:  Mary [name] 10/13/2011 11:07 AM    Family:     Physician:  Lupe Carney, DO 10/13/2011 11:07 AM   Nursing:    Case Manager:  Barrie Folk, RN 10/13/2011 11:07 AM   Counselor:  Ronda Fairly, LCSWA 10/13/2011 11:07 AM   Other:    Other:     Other:     Other:      Scribe for Treatment Team:   Barrie Folk RN MS EDS 10/13/2011 11:05 AM

## 2011-10-13 NOTE — Progress Notes (Signed)
[facility] Group Notes:  (Counselor/Nursing/MHT/Case Management/Adjunct)  10/13/2011 11:02 AM  Type of Therapy:  Psychoeducational Skills  Participation Level:  Did Not Attend   Summary of Progress/Problems: Patient did not attend group.   Ardelle Park O 10/13/2011, 11:02 AM

## 2011-10-13 NOTE — Progress Notes (Signed)
D-C/O peri pain r/t HSV outbreak. A- Tea bags and compresses given. Compliant with scheduled treatment medications. R- Assess discomfort level.

## 2011-10-14 NOTE — Progress Notes (Signed)
Psychoeducational Group Note  Date:  10/14/2011 Time:  1515  Group Topic/Focus:  Self Care:   The focus of this group is to help patients understand the importance of self-care in order to improve or restore emotional, physical, spiritual, interpersonal, and financial health.  Participation Level:  Did Not Attend  Participation Quality:  Patient did not attend  Affect:  Appropriate  Cognitive:  Appropriate  Insight:  None  Engagement in Group:  None  Additional Comments:  Patient remained in bed  Ercie Eliasen R 10/14/2011, 4:52 PM

## 2011-10-14 NOTE — Progress Notes (Signed)
Psychoeducational Group Note  Date:  10/14/2011 Time:  1015  Group Topic/Focus:  Making Healthy Choices:   The focus of this group is to help patients identify negative/unhealthy choices they were using prior to admission and identify positive/healthier coping strategies to replace them upon discharge.  Participation Level:  None  Participation Quality:  Drowsy  Affect:  Depressed  Cognitive:  Appropriate  Insight:  Limited  Engagement in Group:  Limited  Additional Comments:   [name] 10/14/2011, 12:51 PM

## 2011-10-14 NOTE — Progress Notes (Signed)
Pt. attended and participated in aftercare planning group. Pt. verbally accepted information on suicide prevention, warning signs to look for with suicide and crisis line numbers to use. The pt. agreed to call crisis line numbers if having warning signs or having thoughts of suicide. Pt. listed their current anxiety level as "normal". Pt was wrapped in a blanket and had difficulty staying awake in group

## 2011-10-14 NOTE — Progress Notes (Signed)
[facility] Group Notes:  (Counselor/Nursing/MHT/Case Management/Adjunct)  10/14/2011 1:15 PM  Type of Therapy:  Group Therapy, Dance/Movement Therapy   Participation Level:  Did Not Attend   Mary [name] 10/14/2011. 2:07 PM  

## 2011-10-14 NOTE — Progress Notes (Addendum)
D: Pt denies SI/HI/AVH. Pt rates her hopelessness and depression as 2. She rates her anxiety and agitation as 0. Pt affect is bright, mood is anxious. Pt states that she plans to go to [facility] on discharge. She states that she wants to go directly there because she is anxious about going home first due to possibility of using. Pt attended evening group.  A: Support and encouragement offered. Q 15 min checks continued for safety. R: Pt receptive. Pt remains safe.

## 2011-10-14 NOTE — Progress Notes (Signed)
D- Patient isolative to room but is attending groups with minimal participation. A- Mood and affect are brighter and personal appearance is improved.  Continues to c/o peri pain and warm tea compresses offered. Denies SI. Minimal insight into recovery process. R- Continue current POC and evaluation of treatment goals.  Support and encouragment. Continue 15' checks for safety.

## 2011-10-15 NOTE — Progress Notes (Signed)
  Mary [name] is a 46 y.o. female [id number] [birth year]  10/11/2011 Active Problems:  * No active hospital problems. *    Mental Status: Mood is less anxious denies active SI/HI/AVH/   Subjective/Objective:  Feels the Zyprexa is less sedating. Was able to participate better in group yesterday.  Filed Vitals:   10/15/11 0701  BP: 113/76  Pulse: 92  Temp:   Resp:     Lab Results:   BMET    Component Value Date/Time   NA 135 10/10/2011 1610   K 4.0 10/10/2011 1610   CL 103 10/10/2011 1610   CO2 28 10/10/2011 1610   GLUCOSE 72 10/10/2011 1610   BUN 10 10/10/2011 1610   CREATININE 0.81 10/10/2011 1610   CALCIUM 10.3 10/10/2011 1610   GFRNONAA 85* 10/10/2011 1610   GFRAA >90 10/10/2011 1610    Medications:  Scheduled:     . acyclovir ointment   Topical Q3H while awake  . FLUoxetine  20 mg Oral Daily  . metroNIDAZOLE  1 Applicatorful Vaginal BID  . multivitamin with minerals  1 tablet Oral Daily  . nicotine  21 mg Transdermal Q0600  . OLANZapine  5 mg Oral QHS  . pantoprazole  20 mg Oral Q1200  . thiamine  100 mg Oral Daily  . valACYclovir  1,000 mg Oral BID     PRN Meds alum & mag hydroxide-simeth, hydrOXYzine, ibuprofen, loperamide, magnesium hydroxide, ondansetron  Plan: continue current plan of care. Quandarius Nill,[name] 10/15/2011

## 2011-10-15 NOTE — Progress Notes (Signed)
Pt did not attend aftercare planning group but did accept the daily workbook on support systems. 

## 2011-10-15 NOTE — Progress Notes (Signed)
[facility] Group Notes:  (Counselor/Nursing/MHT/Case Management/Adjunct)  10/15/2011 1:15 PM  Type of Therapy:  Group Therapy, Dance/Movement Therapy   Participation Level:  Active  Participation Quality:  Appropriate and Resistant  Affect:  Anxious and Depressed  Cognitive:  Oriented  Insight:  Limited  Engagement in Group:  Limited  Engagement in Therapy:  Limited  Modes of Intervention:  Clarification, Problem-solving, Role-play, Socialization and Support  Summary of Progress/Problems: group focused on how to positively use support systems and how to find ways to offer inner/personal support in recovery. Pt spoke about stopping the cycle of use, increasing self confidence and using support systems. Pt read the poem "I am your recovery" and spoke about how this can be used in a daily recovery plan to offer hope and inspiration. Pt stated that she needs in pt treatment and "lots of help".

## 2011-10-15 NOTE — Progress Notes (Signed)
Patient ID: Mary [name], female   DOB: 10-17-1965, 46 y.o.   MRN: [id number]  Problem: Polysubstance abuse  D: Pt out in milieu interacting well with other patients and staff. Pt with bright affect and good attitude.  A: Monitor patient Q 15 minutes for safety, encourage continued staff/peer interaction and group participation. Administer medications as ordered by MD.  R: Pt pleasant and states she feels better, denies SI or plans to harm herself. No inappropriate behaviors noted at this time.

## 2011-10-15 NOTE — Progress Notes (Signed)
D-Patient isolative to room with no group attendance. A- Rates depression and hopelessness at 2. States loose stools but none observed and no prn medications requested. Denies SI. Minimal insight into recovery process. R- Encourage peer and group participation. Continue current POC and evaluation of treatment goals.  Medication education on Valtrex done.  Continue 15' checks for safety.

## 2011-10-15 NOTE — Progress Notes (Signed)
Psychoeducational Group Note  Date:  10/15/2011 Time:  1515  Group Topic/Focus:  Conflict Resolution:   The focus of this group is to discuss the conflict resolution process and how it may be used upon discharge.  Participation Level:  Active  Participation Quality:  Appropriate and Attentive  Affect:  Appropriate  Cognitive:  Alert and Appropriate  Insight:  Good  Engagement in Group:  Good  Additional Comments:  Pt. Participated in group by listening, filling out worksheets and sharing personal experiences .  Ruta Hinds Perkins 10/15/2011, 6:30 PM

## 2011-10-15 NOTE — Progress Notes (Signed)
[facility] Group Notes:  (Counselor/Nursing/MHT/Case Management/Adjunct)  10/14/2011 2100  Type of Therapy:  wrap up\  Participation Level:  Active  Participation Quality:  Appropriate, Attentive, Sharing and Supportive  Affect:  Appropriate and Depressed  Cognitive:  Appropriate  Insight:  Limited  Engagement in Group:  Good  Engagement in Therapy:  Good  Modes of Intervention:  Clarification, Education and Support  Summary of Progress/Problems:Patient states that she has been resting the past couple of days but she is done being "lazy". Pt reports needs groups because " I don't know how not to use".  Pt reports having good supports outside of hospital.  "Everyone I know is in N/A, A/A, running treatment centers, or running [place] [facility].  They all stay clean but I can't."  Pt stated she was sad and became tearful.  She mentioned not being able to find the courage and that she feels she doesn't deserve any better.  Pt encouraged to definitely continue NA AA meetings and maybe therapy would also help her stay clean.  Another pt referred her to [facility] services because of free counseling.     Shelah Lewandowsky 10/15/2011, 5:07 AM

## 2011-10-15 NOTE — Progress Notes (Signed)
Psychoeducational Group Note  Date:  10/15/2011 Time:  1015  Group Topic/Focus:  Crisis Planning:   The purpose of this group is to help patients create a crisis plan for use upon discharge or in the future, as needed.  Participation Level:  Did Not Attend  Participation Quality:  Drowsy  Affect:  Blunted  Cognitive:  N/A  Insight:  None  Engagement in Group:  None  Additional Comments:  Did not attend   [name] 10/15/2011, 12:06 PM

## 2011-10-15 NOTE — Progress Notes (Signed)
[facility] Adult Inpatient Family/Significant Other Suicide Prevention Education  Suicide Prevention Education:  Patient Refusal for Family/Significant Other Suicide Prevention Education: The patient Mary [name] has refused to provide written consent for family/significant other to be provided Family/Significant Other Suicide Prevention Education during admission and/or prior to discharge.  Physician notified.   Pt is still refusing contact of friends and family to give suicide prevention information. Pt agreed to notify staff if this changes. Pt accepted the information herself.  [facility] 10/15/2011, 11:36 AM

## 2011-10-16 DIAGNOSIS — F1910 Other psychoactive substance abuse, uncomplicated: Secondary | ICD-10-CM

## 2011-10-16 DIAGNOSIS — F1920 Other psychoactive substance dependence, uncomplicated: Secondary | ICD-10-CM | POA: Diagnosis present

## 2011-10-16 DIAGNOSIS — F1994 Other psychoactive substance use, unspecified with psychoactive substance-induced mood disorder: Secondary | ICD-10-CM | POA: Diagnosis present

## 2011-10-16 MED ORDER — OLANZAPINE 5 MG PO TABS: 5.0000 mg | ORAL_TABLET | Freq: Every day | ORAL | Status: AC

## 2011-10-16 MED ORDER — FLUOXETINE HCL 20 MG PO CAPS: 20.0000 mg | ORAL_CAPSULE | Freq: Every day | ORAL | Status: AC

## 2011-10-16 MED ORDER — METRONIDAZOLE 0.75 % VA GEL: VAGINAL | Status: DC

## 2011-10-16 MED ORDER — PANTOPRAZOLE SODIUM 20 MG PO TBEC: 20.0000 mg | DELAYED_RELEASE_TABLET | Freq: Every day | ORAL | Status: DC

## 2011-10-16 MED ORDER — VALACYCLOVIR HCL 1 G PO TABS: 1000.0000 mg | ORAL_TABLET | Freq: Two times a day (BID) | ORAL | Status: DC

## 2011-10-16 MED ORDER — ACYCLOVIR 5 % EX OINT: TOPICAL_OINTMENT | CUTANEOUS | Status: DC

## 2011-10-16 NOTE — Progress Notes (Signed)
Pt reports that she slept well, appetite is good, energy level is low, ability to pay attention is improving, rates depression at 4 and hopelessness at 1, anxiety at 6, c/o chills and agitation, int left side pain rated 6 out of 10 this am and blurred vision all per pt self inventory, denies SI, goal is to attend more meetings, talk more to her sponsor and be more open minded and honest, pt is aware of her d/c plans and sees no problem s staying on her medications after d/c.

## 2011-10-16 NOTE — Progress Notes (Signed)
Psychoeducational Group Note  Date:  10/16/2011 Time:  1000  Group Topic/Focus:  Self Care:   The focus of this group is to help patients understand the importance of self-care in order to improve or restore emotional, physical, spiritual, interpersonal, and financial health.  Participation Level:  Active  Participation Quality:  Appropriate  Affect:  Appropriate  Cognitive:  Appropriate  Insight:  Limited  Engagement in Group:  Limited  Additional Comments:  Pt attended group this morning but participated minimally.  Demaryius Imran E 10/16/2011, 5:04 PM

## 2011-10-16 NOTE — [facility] Suicide Risk Assessment (Signed)
Suicide Risk Assessment  Discharge Assessment     Demographic factors:  Low socioeconomic status;Unemployed  Current Mental Status Per Nursing Assessment::  On Admission:  At Discharge: The patient was seen today and reports to sleeping well without difficulty and reports a good appetite. She reports some mild feelings of sadness, anhedonia and depressed mood and adamantly denies any suicidal or homicidal ideations. The patient also denies any auditory or visual hallucinations or delusional thinking but reports some moderate anxiety symptoms. She denies any symptoms of substance withdrawal and feels she is ready for discharge.  The patient states that she will be leaving early in the morning to enter [facility] for further treatment of her substance abuse issues.  She states she is very grateful for the treatment she has received at [facility],  Current Mental Status Per Physician:  Diagnosis:  Axis I:  Polysubstance Dependence - Including Alcohol, Cocaine and Cannabis.   Substance Induced Mood Disorder.  The patient was seen today and reports the following:   ADL's: Intact.  Sleep: The patient reports that she is sleeping well without difficulty.  Appetite: The patient reports a good appetite this morning.   Mild>(1-10) >Severe  Hopelessness (1-10): 0  Depression (1-10): 3  Anxiety (1-10): 5   Suicidal Ideation: The patient adamantly denies any suicidal ideations today.  Plan: No  Intent: No  Means: No   Homicidal Ideation: The patient adamantly denies any homicidal ideations today.  Plan: No  Intent: No.  Means: No   General Appearance/Behavior: The patient was friendly and cooperative today with this provider.  Eye Contact: Good.  Speech: Appropriate in rate and volume with no pressuring noted today.  Motor Behavior: wnl.  Level of Consciousness: Alert and Oriented x 3.  Mental Status: Alert and Oriented x 3.  Mood: Mildly Depressed.  Affect: Mildly  Constricted.  Anxiety Level: Moderate anxiety reported today.  Thought Process: wnl.  Thought Content: The patient denies any auditory or visual hallucinations today as well as any delusional thinking.  Perception: wnl.  Judgment: Good.  Insight: Good.  Cognition: Oriented to person, place and time.  Current Medications:     . acyclovir ointment   Topical Q3H while awake  . FLUoxetine  20 mg Oral Daily  . metroNIDAZOLE  1 Applicatorful Vaginal BID  . multivitamin with minerals  1 tablet Oral Daily  . nicotine  21 mg Transdermal Q0600  . OLANZapine  5 mg Oral QHS  . pantoprazole  20 mg Oral Q1200  . thiamine  100 mg Oral Daily  . valACYclovir  1,000 mg Oral BID   Loss Factors:  Financial problems.  Limited Primary Support.  Historical Factors:  Long history of substance abuse issues.   Risk Reduction Factors:  Good access to healthcare.   Continued Clinical Symptoms:  Alcohol/Substance Abuse/Dependencies   Discharge Diagnoses:  AXIS I:  Polysubstance Dependence - Including Alcohol, Cocaine and Cannabis.   Substance Induced Mood Disorder. AXIS II:  Deferred.  AXIS III:  1. Hepatitis C.   2. History of Hiatal Hernia.   3. Recent Syphilis Infection.    4. Herpes Symplex Infection. AXIS IV:  Chronic Substance Abuse Issues. Limited Primary Support System.  AXIS V:  GAF at time of admission approximately 35. GAF at time of discharge approximately 60.    Cognitive Features That Contribute To Risk:  None Noted.  The patient was seen today and reports to sleeping well without difficulty and reports a good appetite. She reports some  mild feelings of sadness, anhedonia and depressed mood and adamantly denies any suicidal or homicidal ideations. The patient also denies any auditory or visual hallucinations or delusional thinking but reports some moderate anxiety symptoms. She denies any symptoms of substance withdrawal and feels she is ready for discharge.  The patient states that  she will be leaving early in the morning to enter [facility] for further treatment of her substance abuse issues.  She states she is very grateful for the treatment she has received at [facility],  Review of Systems:  Neurological: The patient denies any headaches today. He denies any seizures or dizziness.  G.I.: The patient denies any constipation or G.I. Upset today.  Musculoskeletal: The patient denies any musculoskeletal issues today.   Treatment Plan Summary:  1. Daily contact with patient to assess and evaluate symptoms and progress in treatment.  2. Medication management  3. The patient will deny suicidal ideations or homicidal ideations for 48 hours prior to discharge and have a depression and anxiety rating of 3 or less. The patient will also deny any auditory or visual hallucinations or delusional thinking.  4. The patient will deny any symptoms of substance withdrawal at time of discharge.   Plan:  1. Will continue the patient on the medications as listed above.  2. Will continue to monitor.  3. Laboratory studies reviewed.  4. Will discharge the patient tomorrow morning as requested to enter the [facility] for longer term treatment of her substance abuse issues.  Suicide Risk:  Minimal: No identifiable suicidal ideation. Patients presenting with no risk factors but with morbid ruminations; may be classified as minimal risk based on the severity of the depressive symptoms   Plan Of Care/Follow-up recommendations:  Activity: As tolerated.  Diet: Regular Diet.  Other: Please take all medications only as directed and keep all scheduled follow up appointments. Abstain from any use of alcohol or illicit drugs.  Criss Pallone 10/16/2011, 9:40 PM

## 2011-10-16 NOTE — Progress Notes (Signed)
Patient did attend the evening speaker AA meeting.  

## 2011-10-16 NOTE — Progress Notes (Signed)
[facility] Group Notes:  (Counselor/Nursing/MHT/Case Management/Adjunct)  10/16/2011 3:33 PM   Type of Therapy:  Group Therapy from 1:15 to 2:30 PM  Participation Level:  Minimal  Participation Quality:  Intrusive  Affect:  Anxious  Cognitive:  Confused  Insight:  Limited  Engagement in Group:  Limited  Engagement in Therapy:  Limited  Modes of Intervention:  Clarification, Limit-setting and Support  Summary of Progress/Problems:  Group discussion focused on what patient's see as their own obstacles to recovery.  Patient shares belief that "everything will be difficult to deal with and I'm not sure I can even do it.  It's hard for me to just be still here."  Patient was in fact up and down and in and out of group therapy room at least three times.   Patient shared difficulty in anyone believing in her anymore; when asked if she could even believe in herself patient became emotional but was open to others in group believing in her ability to stay clean one day at a time."     Clide Dales 10/16/2011, 3:37 PM

## 2011-10-16 NOTE — Progress Notes (Signed)
[facility] MD Progress Note                                         10/16/2011    Mary [name] 1965-08-21    [id number] Hospital day #5  1. BV (bacterial vaginosis)   2. Polysubstance abuse     The patient was seen today and reports the following:   Sleep: pretty good Appetite: pretty good  Mild>(1-10) >Severe  Depression (1-10):2-3/10 Anxiety (1-10):  5-6/10 Hopelessness (1-10): 0   Suicidal Ideation: the patient denies suicidal ideation. Plan: None Intent: None Means:  None  Homicidal Ideation: the patient denies homicidal ideation. Plan: None Intent: None Means: None  Eye Contact: Good.  General Appearance: Casual Behavior:  cooperative Motor Behavior:  normal Speech: clear  Mental Status:  Orientation x 3 Level of Consciousness:   alert Mood:  "irritable" Affect:  congruent   Thought Process: linear Thought Content: denies AH/VH Perception: intact  Judgment: fair Insight: present Cognition: at least average  VS: height is 5' 4.5" (1.638 m) and weight is 61.236 kg (135 lb). Her oral temperature is 97.9 F (36.6 C). Her blood pressure is 101/68 and her pulse is 98. Her respiration is 20.  Current Medication:  . acyclovir ointment   Topical Q3H while awake  . FLUoxetine  20 mg Oral Daily  . metroNIDAZOLE  1 Applicatorful Vaginal BID  . multivitamin with minerals  1 tablet Oral Daily  . nicotine  21 mg Transdermal Q0600  . OLANZapine  5 mg Oral QHS  . pantoprazole  20 mg Oral Q1200  . thiamine  100 mg Oral Daily  . valACYclovir  1,000 mg Oral BID    LABS: Results for Mary [name] (MRN [id number]) as of 10/16/2011 11:26  Ref. Range 10/10/2011 16:32  Chlamydia Latest Range: NEGATIVE  NEGATIVE  GC Probe Amp, Genital Latest Range: NEGATIVE  NEGATIVE  Yeast Wet Prep HPF POC Latest Range: NONE SEEN  NONE SEEN  Trich, Wet Prep Latest Range: NONE SEEN  NONE SEEN  Clue Cells Wet Prep HPF POC Latest Range: NONE SEEN  FEW (A)  WBC, Wet Prep HPF POC Latest  Range: NONE SEEN  MODERATE (A)  WET PREP, GENITAL No range found Rpt (A)  Results for Mary [name] (MRN [id number]) as of 10/16/2011 11:26  Ref. Range 10/11/2011 05:07  RPR Latest Range: NON REACTIVE  Reactive (A)  RPR Titer Latest Range: NON REACTIVE  1:16 (A)  T pallidum Antibodies (TP-PA) Latest Range: <0.90 S/CO 7.42 (H)   ROS:    Constitutional: WDWN Adult in NAD   GI: Negative for N,V,D,C   Neuro: Negative for dizziness, blurred vision, visual changes, headaches   Resp: Negative for wheezing, SOB, cough   Cardio: Negative for CP, diaphoresis, fatigue   MSK: Negative for joint pain, swelling, DROM, or ambulatory difficulties.     GU: did discuss lab results and follow up care. Time was spent with the patient discussing the current symptoms and the response to treatment. Patient states she is a little more serious about recovery this time and feels motivated to do well.  Reviewed her medications with her for her D/C in AM for [facility].   Treatment plan: 1. Will need early AM discharge tomorrow for [facility] program. 2. SRA >3pm today by MD. 3. Will request samples and sign prescriptions today.  [name]. [name] [facility] 10/16/2011

## 2011-10-16 NOTE — Progress Notes (Signed)
Pt attended discharge planning group and actively participated in group.  SW provided pt with today's workbook.  Pt presents with anxious mood and affect.  Pt denies having depression and SI and rates anxiety at a 8-9.  Pt states that she has been to numerous treatment centers, to include [facility], [facility] and [facility].  Pt states that she wants to go to [facility].  SW contacted [facility] and scheduled pt to go to [facility] tomorrow morning at 8:00 am.  Pt states that she has her own transportation there.  No further needs voiced by pt at this time.  Safety planning and suicide prevention discussed.  Pt participated in discussion and acknowledged an understanding of the information provided.       Reyes Ivan, LCSWA 10/16/2011  1:06 PM

## 2011-10-17 NOTE — Progress Notes (Signed)
Patient ID: Mary [name], female   DOB: 1965-07-25, 47 y.o.   MRN: [id number]  Patient discharged with plans to go to [facility] this am. Pt denies SI/HI at this time. Pt's belongings returned to her; pt verified all belongings. No s/s of distress noted at this time. Pt verbalizes an understanding of discharge instructions, medication administration and follow up appointments.

## 2011-10-17 NOTE — Progress Notes (Signed)
Patient ID: Mary [name], female   DOB: [birth year], 46 y.o.   MRN: [id number]  Pt currently asleep; no s/s of distress noted at this time.

## 2011-10-18 NOTE — Progress Notes (Signed)
Patient Discharge Instructions:  After Visit Summary (AVS):   Faxed to:  10/18/2011 Psychiatric Admission Assessment Note:   Faxed to:  10/18/2011 Suicide Risk Assessment - Discharge Assessment:   Faxed to:  10/18/2011 Faxed/Sent to the Next Level Care provider:  10/18/2011  Faxed to [facility] @ [phone]  Wandra Scot, 10/18/2011, 5:37 PM

## 2011-10-29 NOTE — Discharge Summary (Signed)
Physician Discharge Summary Note  Patient:  Mary [name] is an 46 y.o., female MRN:  [id number] DOB:  [birth year] Patient phone:  [phone] (home)  Patient address:   [address] [place] [address]   Date of Admission:  10/11/2011 Date of Discharge: 10/17/2011  Discharge Diagnoses: Principal Problem:  *Polysubstance dependence Active Problems:  Substance induced mood disorder  Axis Diagnosis:  AXIS I:  Polysubstance Dependence - Including Alcohol, Cocaine and Cannabis.    Substance Induced Mood Disorder.  AXIS II: Deferred.  AXIS III: 1. Hepatitis C.  2. History of Hiatal Hernia.  3. Recent Syphilis Infection.  4. Herpes Symplex Infection.  AXIS IV: Chronic Substance Abuse Issues. Limited Primary Support System.  AXIS V: GAF at time of admission approximately 35. GAF at time of discharge approximately 60.   Level of Care:  Inpatient Psychiatric Hospitalization. Reason For Admission: This is third admission since March .  Left here in March and went to [facility] stayed 5 days. 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 [facility] gave her a HIV test and called a her a few days ago recommending that she get treatment. Her RPRwas + and she was treated with Bicillan 2.4 million units IM in the ED. She was also treated for genital herpes with Acyclovir and Dibucaine.  Denies current SI wants to go to a long term [facility] program -[facility].   Hospital Course:   The patient attended treatment team meeting this am and met with treatment team members. The patient's symptoms, treatment plan and response to treatment was discussed. The patient endorsed that their symptoms have improved. The patient also stated that they felt stable for discharge.  They reported that from this hospital stay they had learned many coping skills.  In other to maintain their psychiatric stability, they will  continue psychiatric care on an outpatient basis. They will follow-up as outlined below.  In addition they were instructed  to take all your medications as prescribed by their mental healthcare provider and to report any adverse effects and or reactions from your medicines to their outpatient provider promptly.  The patient is also instructed and cautioned to not engage in alcohol and or illegal drug use while on prescription medicines.  In the event of worsening symptoms the patient is instructed to call the crisis hotline, 911 and or go to the nearest ED for appropriate evaluation and treatment of symptoms.   Also while a patient in this hospital, the patient received medication management for his psychiatric symptoms. They were ordered and received as outlined below:  Medication List  As of 10/29/2011  6:49 PM   TAKE these medications      Indication    acyclovir ointment 5 %   Commonly known as: ZOVIRAX   Apply topically every 3 (three) hours while awake. For herpes.       FLUoxetine 20 MG capsule   Commonly known as: PROZAC   Take 1 capsule (20 mg total) by mouth daily. For anxiety and depression.       metroNIDAZOLE 0.75 % vaginal gel   Commonly known as: METROGEL   1 applicator in vagina 2 x a day for bacterial vaginosis. Please give remainder of supply on discharge.       OLANZapine 5 MG tablet   Commonly known as: ZYPREXA   Take 1 tablet (5 mg total) by mouth at bedtime. For mood stability  and psychosis.    Indication: Manic-Depression      pantoprazole 20 MG tablet   Commonly known as: PROTONIX   Take 1 tablet (20 mg total) by mouth daily at 12 noon. For reflux.    Indication: Conditions of Excess Stomach Acid Secretion      valACYclovir 1000 MG tablet   Commonly known as: VALTREX   Take 1 tablet (1,000 mg total) by mouth 2 (two) times daily. For genital herpes.    Indication: Genital Herpes           They were also enrolled in group counseling sessions and activities  in which they participated actively.   Follow-up Information    Follow up with [facility] on 10/17/2011. (Arrive there promptly at 8:00 am)    Contact information:   [address] [place], [place] [address] [phone]        Upon discharge, patient adamantly denies suicidal, homicidal ideations, auditory, visual hallucinations and or delusional thinking. They left [facility] with all personal belongings via personal transportation in no apparent distress.  Consults:  Please see the patient's electronic medical records for more details.  Significant Diagnostic Studies:  Please see the patient's electronic medical records for more details.  Discharge Vitals:   Blood pressure 101/68, pulse 98, temperature 97.9 F (36.6 C), temperature source Oral, resp. rate 20, height 5' 4.5" (1.638 m), weight 61.236 kg (135 lb)..  Mental Status Exam: Demographic factors:  Low socioeconomic status;Unemployed   Current Mental Status Per Nursing Assessment::  On Admission:  At Discharge: The patient was seen today and reports to sleeping well without difficulty and reports a good appetite. She reports some mild feelings of sadness, anhedonia and depressed mood and adamantly denies any suicidal or homicidal ideations. The patient also denies any auditory or visual hallucinations or delusional thinking but reports some moderate anxiety symptoms. She denies any symptoms of substance withdrawal and feels she is ready for discharge. The patient states that she will be leaving early in the morning to enter [facility] for further treatment of her substance abuse issues. She states she is very grateful for the treatment she has received at [facility],   Current Mental Status Per Physician:  Diagnosis:  Axis I: Polysubstance Dependence - Including Alcohol, Cocaine and Cannabis.  Substance Induced Mood Disorder.   The patient was seen today and reports the following:   ADL's: Intact.  Sleep: The  patient reports that she is sleeping well without difficulty.  Appetite: The patient reports a good appetite this morning.   Mild>(1-10) >Severe  Hopelessness (1-10): 0  Depression (1-10): 3  Anxiety (1-10): 5   Suicidal Ideation: The patient adamantly denies any suicidal ideations today.  Plan: No  Intent: No  Means: No   Homicidal Ideation: The patient adamantly denies any homicidal ideations today.  Plan: No  Intent: No.  Means: No   General Appearance/Behavior: The patient was friendly and cooperative today with this provider.  Eye Contact: Good.  Speech: Appropriate in rate and volume with no pressuring noted today.  Motor Behavior: wnl.  Level of Consciousness: Alert and Oriented x 3.  Mental Status: Alert and Oriented x 3.  Mood: Mildly Depressed.  Affect: Mildly Constricted.  Anxiety Level: Moderate anxiety reported today.  Thought Process: wnl.  Thought Content: The patient denies any auditory or visual hallucinations today as well as any delusional thinking.  Perception: wnl.  Judgment: Good.  Insight: Good.  Cognition: Oriented to person, place and time.  Current Medications:  .  acyclovir ointment   Topical  Q3H while awake   .  FLUoxetine  20 mg  Oral  Daily   .  metroNIDAZOLE  1 Applicatorful  Vaginal  BID   .  multivitamin with minerals  1 tablet  Oral  Daily   .  nicotine  21 mg  Transdermal  Q0600   .  OLANZapine  5 mg  Oral  QHS   .  pantoprazole  20 mg  Oral  Q1200   .  thiamine  100 mg  Oral  Daily   .  valACYclovir  1,000 mg  Oral  BID    Loss Factors:  Financial problems. Limited Primary Support.   Historical Factors:  Long history of substance abuse issues.   Risk Reduction Factors:  Good access to healthcare.   Continued Clinical Symptoms:  Alcohol/Substance Abuse/Dependencies   Discharge Diagnoses:  AXIS I: Polysubstance Dependence - Including Alcohol, Cocaine and Cannabis.  Substance Induced Mood Disorder.  AXIS II: Deferred.    AXIS III: 1. Hepatitis C.  2. History of Hiatal Hernia.  3. Recent Syphilis Infection.  4. Herpes Symplex Infection.  AXIS IV: Chronic Substance Abuse Issues. Limited Primary Support System.  AXIS V: GAF at time of admission approximately 35. GAF at time of discharge approximately 60.   Cognitive Features That Contribute To Risk:  None Noted.  The patient was seen today and reports to sleeping well without difficulty and reports a good appetite. She reports some mild feelings of sadness, anhedonia and depressed mood and adamantly denies any suicidal or homicidal ideations. The patient also denies any auditory or visual hallucinations or delusional thinking but reports some moderate anxiety symptoms. She denies any symptoms of substance withdrawal and feels she is ready for discharge. The patient states that she will be leaving early in the morning to enter [facility] for further treatment of her substance abuse issues. She states she is very grateful for the treatment she has received at [facility],   Review of Systems:  Neurological: The patient denies any headaches today. He denies any seizures or dizziness.  G.I.: The patient denies any constipation or G.I. Upset today.  Musculoskeletal: The patient denies any musculoskeletal issues today.   Treatment Plan Summary:  1. Daily contact with patient to assess and evaluate symptoms and progress in treatment.  2. Medication management  3. The patient will deny suicidal ideations or homicidal ideations for 48 hours prior to discharge and have a depression and anxiety rating of 3 or less. The patient will also deny any auditory or visual hallucinations or delusional thinking.  4. The patient will deny any symptoms of substance withdrawal at time of discharge.   Plan:  1. Will continue the patient on the medications as listed above.  2. Will continue to monitor.  3. Laboratory studies reviewed.  4. Will discharge the patient tomorrow  morning as requested to enter the [facility] for longer term treatment of her substance abuse issues.   Suicide Risk:  Minimal: No identifiable suicidal ideation. Patients presenting with no risk factors but with morbid ruminations; may be classified as minimal risk based on the severity of the depressive symptoms   Plan Of Care/Follow-up recommendations:  Activity: As tolerated.  Diet: Regular Diet.  Other: Please take all medications only as directed and keep all scheduled follow up appointments. Abstain from any use of alcohol or illicit drugs.  Discharge destination:  Home.  Is patient on  multiple antipsychotic therapies at discharge:  No  Has Patient had three or more failed trials of antipsychotic monotherapy by history: N/A Recommended Plan for Multiple Antipsychotic Therapies: N/A  Discharge Orders    Future Orders Please Complete By Expires   Diet - low sodium heart healthy      Increase activity slowly      Discharge instructions      Comments:   Take all of your medications as prescribed.  Be sure to keep ALL follow up appointments as scheduled. This is to ensure getting your refills on time to avoid any interruption in your medication.  If you find that you can not keep your appointment, call the clinic and reschedule. Be sure to tell the nurse if you will need a refill before your appointment.     Medication List  As of 10/29/2011  6:49 PM   TAKE these medications      Indication    acyclovir ointment 5 %   Commonly known as: ZOVIRAX   Apply topically every 3 (three) hours while awake. For herpes.       FLUoxetine 20 MG capsule   Commonly known as: PROZAC   Take 1 capsule (20 mg total) by mouth daily. For anxiety and depression.       metroNIDAZOLE 0.75 % vaginal gel   Commonly known as: METROGEL   1 applicator in vagina 2 x a day for bacterial vaginosis. Please give remainder of supply on discharge.       OLANZapine 5 MG tablet   Commonly known  as: ZYPREXA   Take 1 tablet (5 mg total) by mouth at bedtime. For mood stability and psychosis.    Indication: Manic-Depression      pantoprazole 20 MG tablet   Commonly known as: PROTONIX   Take 1 tablet (20 mg total) by mouth daily at 12 noon. For reflux.    Indication: Conditions of Excess Stomach Acid Secretion      valACYclovir 1000 MG tablet   Commonly known as: VALTREX   Take 1 tablet (1,000 mg total) by mouth 2 (two) times daily. For genital herpes.    Indication: Genital Herpes           Follow-up Information    Follow up with [facility] on 10/17/2011. (Arrive there promptly at 8:00 am)    Contact information:   [address] [place], [place] [address] [phone]        Follow-up recommendations:   Activities: Resume typical activities Diet: Resume typical diet Other: Follow up with outpatient provider and report any side effects to out patient prescriber.  Comments:  Take all your medications as prescribed by your mental healthcare provider. Report any adverse effects and or reactions from your medicines to your outpatient provider promptly. Patient is instructed and cautioned to not engage in alcohol and or illegal drug use while on prescription medicines. In the event of worsening symptoms, patient is instructed to call the crisis hotline, 911 and or go to the nearest ED for appropriate evaluation and treatment of symptoms.  SignedFranchot Gallo 10/29/2011 6:49 PM

## 2012-06-14 ENCOUNTER — Telehealth (HOSPITAL_COMMUNITY): Payer: Self-pay | Admitting: Licensed Clinical Social Worker

## 2012-06-14 ENCOUNTER — Emergency Department (HOSPITAL_COMMUNITY): Payer: Self-pay

## 2012-06-14 ENCOUNTER — Encounter (HOSPITAL_COMMUNITY): Payer: Self-pay | Admitting: Emergency Medicine

## 2012-06-14 ENCOUNTER — Emergency Department (HOSPITAL_COMMUNITY)
Admission: EM | Admit: 2012-06-14 | Discharge: 2012-06-14 | Disposition: A | Discharge status: Other Acute Inpatient Hospital | Payer: Self-pay | Attending: Emergency Medicine | Admitting: Emergency Medicine

## 2012-06-14 DIAGNOSIS — R059 Cough, unspecified: Secondary | ICD-10-CM | POA: Insufficient documentation

## 2012-06-14 DIAGNOSIS — R45851 Suicidal ideations: Secondary | ICD-10-CM | POA: Insufficient documentation

## 2012-06-14 DIAGNOSIS — F17200 Nicotine dependence, unspecified, uncomplicated: Secondary | ICD-10-CM | POA: Insufficient documentation

## 2012-06-14 DIAGNOSIS — F32A Depression, unspecified: Secondary | ICD-10-CM

## 2012-06-14 DIAGNOSIS — F1920 Other psychoactive substance dependence, uncomplicated: Secondary | ICD-10-CM

## 2012-06-14 DIAGNOSIS — R05 Cough: Secondary | ICD-10-CM | POA: Insufficient documentation

## 2012-06-14 DIAGNOSIS — Z8719 Personal history of other diseases of the digestive system: Secondary | ICD-10-CM | POA: Insufficient documentation

## 2012-06-14 DIAGNOSIS — Z8619 Personal history of other infectious and parasitic diseases: Secondary | ICD-10-CM | POA: Insufficient documentation

## 2012-06-14 DIAGNOSIS — F3289 Other specified depressive episodes: Secondary | ICD-10-CM | POA: Insufficient documentation

## 2012-06-14 DIAGNOSIS — F329 Major depressive disorder, single episode, unspecified: Secondary | ICD-10-CM | POA: Insufficient documentation

## 2012-06-14 DIAGNOSIS — F1994 Other psychoactive substance use, unspecified with psychoactive substance-induced mood disorder: Secondary | ICD-10-CM

## 2012-06-14 LAB — CBC WITH DIFFERENTIAL/PLATELET
Basophils Absolute: 0 10*3/uL (ref 0.0–0.1)
Lymphocytes Relative: 23 % (ref 12–46)
Monocytes Relative: 11 % (ref 3–12)
Neutrophils Relative %: 64 % (ref 43–77)
Platelets: 356 10*3/uL (ref 150–400)
RBC: 4.03 MIL/uL (ref 3.87–5.11)
RDW: 13 % (ref 11.5–15.5)
WBC: 12.4 10*3/uL — ABNORMAL HIGH (ref 4.0–10.5)

## 2012-06-14 LAB — COMPREHENSIVE METABOLIC PANEL
ALT: 10 U/L (ref 0–35)
AST: 14 U/L (ref 0–37)
Albumin: 3.5 g/dL (ref 3.5–5.2)
Alkaline Phosphatase: 96 U/L (ref 39–117)
Chloride: 102 mEq/L (ref 96–112)
Creatinine, Ser: 0.65 mg/dL (ref 0.50–1.10)
Potassium: 3.7 mEq/L (ref 3.5–5.1)
Sodium: 136 mEq/L (ref 135–145)
Total Bilirubin: 0.6 mg/dL (ref 0.3–1.2)

## 2012-06-14 MED ORDER — OLANZAPINE 5 MG PO TABS: 10.0000 mg | ORAL_TABLET | Freq: Every day | ORAL | Status: DC

## 2012-06-14 MED ORDER — LORAZEPAM 1 MG PO TABS: 1.0000 mg | ORAL_TABLET | Freq: Three times a day (TID) | ORAL | Status: DC | PRN

## 2012-06-14 MED ORDER — IBUPROFEN 600 MG PO TABS: 600.0000 mg | ORAL_TABLET | Freq: Three times a day (TID) | ORAL | Status: DC | PRN

## 2012-06-14 MED ORDER — FLUOXETINE HCL 20 MG PO CAPS
20.0000 mg | ORAL_CAPSULE | Freq: Every day | ORAL | Status: DC
  Administered 2012-06-14: 20 mg via ORAL
  Filled 2012-06-14: qty 1

## 2012-06-14 MED ORDER — ALBUTEROL SULFATE HFA 108 (90 BASE) MCG/ACT IN AERS: 2.0000 | INHALATION_SPRAY | RESPIRATORY_TRACT | Status: DC | PRN
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
## 2012-06-14 NOTE — ED Notes (Signed)
Pt Used restroom and unable to void at this moment.

## 2012-06-14 NOTE — ED Notes (Signed)
Charge Diane and Dr Haynes Dage aware that pt is SI/HI.
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
## 2012-06-14 NOTE — ED Provider Notes (Addendum)
History     CSN: [id number]  Arrival date & time 06/14/12  1152   First MD Initiated Contact with Patient 06/14/12 1219      Chief Complaint  Patient presents with  . Medical Clearance    (Consider location/radiation/quality/duration/timing/severity/associated sxs/prior treatment) The history is provided by the patient.   patient states she is depressed and suicidal. She states she is using $150 worth of crack a day. She also does some alcohol. She last used yesterday. She states that she got high yesterday and wanted to kill herself and other people. She states she is no longer high but now feels continued depression. She states she wants to kill herself and would either do it by cutting herself or stepping in front of traffic. Patient states she does not want to die. No fevers. She has had an occasional cough. She also smokes cigarettes. A chest pain. No abdominal pain. No nausea vomiting diarrhea. She states she has occasional headache.  Past Medical History  Diagnosis Date  . Hernia   . Hepatitis C   . Drug abuse and dependence   . Depression   . H/O hiatal hernia     Past Surgical History  Procedure Laterality Date  . Splenectomy    . Tubal ligation      No family history on file.  History  Substance Use Topics  . Smoking status: Current Every Day Smoker -- 1.00 packs/day for 20 years    Types: Cigarettes  . Smokeless tobacco: Not on file  . Alcohol Use: Yes     Comment: 1 pt daily    OB History   Grav Para Term Preterm Abortions TAB SAB Ect Mult Living                  Review of Systems  Constitutional: Negative for activity change and appetite change.  HENT: Negative for neck stiffness.   Eyes: Negative for pain.  Respiratory: Positive for cough. Negative for chest tightness and shortness of breath.   Cardiovascular: Negative for chest pain and leg swelling.  Gastrointestinal: Negative for nausea, vomiting, abdominal pain and diarrhea.  Genitourinary:  Negative for flank pain.  Musculoskeletal: Negative for back pain.  Skin: Negative for rash.  Neurological: Negative for weakness, numbness and headaches.  Psychiatric/Behavioral: Positive for suicidal ideas. Negative for hallucinations and behavioral problems.    Allergies  Review of patient's allergies indicates no known allergies.  Home Medications   Current Outpatient Rx  Name  Route  Sig  Dispense  Refill  . FLUoxetine (PROZAC) 20 MG capsule   Oral   Take 1 capsule (20 mg total) by mouth daily. For anxiety and depression.   30 capsule   0   . OLANZapine (ZYPREXA) 10 MG tablet   Oral   Take 10 mg by mouth at bedtime.           BP 116/85  Pulse 86  Temp(Src) 97.4 F (36.3 C)  Resp 16  SpO2 97%  LMP 06/07/2012  Physical Exam  Nursing note and vitals reviewed. Constitutional: She is oriented to person, place, and time. She appears well-developed and well-nourished.  HENT:  Head: Normocephalic and atraumatic.  Eyes: EOM are normal. Pupils are equal, round, and reactive to light.  Neck: Normal range of motion. Neck supple.  Cardiovascular: Normal rate, regular rhythm and normal heart sounds.   No murmur heard. Pulmonary/Chest: Effort normal. No respiratory distress. She has wheezes. She has no rales.  Mild diffuse wheezes.  Abdominal: Soft. Bowel sounds are normal. She exhibits no distension. There is no tenderness. There is no rebound and no guarding.  Musculoskeletal: Normal range of motion.  Neurological: She is alert and oriented to person, place, and time. No cranial nerve deficit.  Skin: Skin is warm and dry.  Psychiatric: Her speech is normal.  Patient appears depressed    ED Course  Procedures (including critical care time)  Labs Reviewed  CBC WITH DIFFERENTIAL - Abnormal; Notable for the following:    WBC 12.4 (*)    Neutro Abs 7.9 (*)    Monocytes Absolute 1.4 (*)    All other components within normal limits  COMPREHENSIVE METABOLIC PANEL   ETHANOL  URINALYSIS, ROUTINE W REFLEX MICROSCOPIC  URINE RAPID DRUG SCREEN (HOSP PERFORMED)   Dg Chest 2 View  06/14/2012  *RADIOLOGY REPORT*  Clinical Data: Chest pain, short of breath, smoking history  CHEST - 2 VIEW  Comparison: Chest x-ray of 06/07/2011  Findings: No active infiltrate or effusion is seen. Mild peribronchial thickening is noted which may indicate bronchitis, possibly chronic in this patient with smoking history.  Mediastinal contours appear stable.  The heart is within normal limits in size. No bony abnormality is seen.  IMPRESSION: No active lung disease.  Question chronic bronchitis.   Original Report Authenticated By: Dwyane Dee, M.D.      1. Polysubstance dependence   2. Substance induced mood disorder   3. Depression       MDM  Patient presents with polysubstance abuse and depression with suicidal thoughts. She is $150 worth of crack a day. This time she appears to medically cleared, however urinalysis and drug screen are still pending. She'll be seen by ACT team.        Juliet Rude. Rubin Payor, MD 06/14/12 1539  Accepted at [facility] R. Rubin Payor, MD 06/14/12 [phone]

## 2012-06-14 NOTE — Progress Notes (Signed)
Pt referred to [facility] pending review.  Pt referred to [facility] pending review.  Pt referred to [facility] pending review.   Catha Gosselin, LCSWA  [phone] 06/14/2012 1452pm

## 2012-06-14 NOTE — ED Notes (Signed)
Pt unable to void X 2.

## 2012-06-14 NOTE — ED Notes (Signed)
Patient transported to X-ray 

## 2012-06-14 NOTE — Progress Notes (Signed)
Pt accepted to [facility], 2596, accepting MD is Dr. Deneen Harts. RN informed, and can call report to [phone]. Pt to be transported by [facility].   Catha Gosselin, LCSWA  [phone] 06/14/2012 1559pm

## 2012-06-14 NOTE — ED Notes (Signed)
Pt being discharged to [facility]. Being transferred by [facility]. Affect and mood are appropriate. Presently denies SI and HI. States that she wants help. Affect is flat and mood derpessed.

## 2012-06-18 ENCOUNTER — Encounter: Payer: Self-pay | Admitting: Family Medicine

## 2013-02-02 ENCOUNTER — Encounter (HOSPITAL_BASED_OUTPATIENT_CLINIC_OR_DEPARTMENT_OTHER): Payer: Self-pay | Admitting: Emergency Medicine

## 2013-02-02 ENCOUNTER — Emergency Department (HOSPITAL_BASED_OUTPATIENT_CLINIC_OR_DEPARTMENT_OTHER)
Admission: EM | Admit: 2013-02-02 | Discharge: 2013-02-02 | Disposition: A | Discharge status: Home / Self Care | Payer: Self-pay | Attending: Emergency Medicine | Admitting: Emergency Medicine

## 2013-02-02 DIAGNOSIS — N764 Abscess of vulva: Secondary | ICD-10-CM | POA: Insufficient documentation

## 2013-02-02 DIAGNOSIS — Z79899 Other long term (current) drug therapy: Secondary | ICD-10-CM | POA: Insufficient documentation

## 2013-02-02 DIAGNOSIS — N949 Unspecified condition associated with female genital organs and menstrual cycle: Secondary | ICD-10-CM | POA: Insufficient documentation

## 2013-02-02 DIAGNOSIS — Z8619 Personal history of other infectious and parasitic diseases: Secondary | ICD-10-CM | POA: Insufficient documentation

## 2013-02-02 DIAGNOSIS — J029 Acute pharyngitis, unspecified: Secondary | ICD-10-CM | POA: Insufficient documentation

## 2013-02-02 DIAGNOSIS — F329 Major depressive disorder, single episode, unspecified: Secondary | ICD-10-CM | POA: Insufficient documentation

## 2013-02-02 DIAGNOSIS — F3289 Other specified depressive episodes: Secondary | ICD-10-CM | POA: Insufficient documentation

## 2013-02-02 DIAGNOSIS — Z8719 Personal history of other diseases of the digestive system: Secondary | ICD-10-CM | POA: Insufficient documentation

## 2013-02-02 DIAGNOSIS — F17200 Nicotine dependence, unspecified, uncomplicated: Secondary | ICD-10-CM | POA: Insufficient documentation

## 2013-02-02 LAB — WET PREP, GENITAL: Yeast Wet Prep HPF POC: NONE SEEN

## 2013-02-02 NOTE — ED Notes (Signed)
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  Has been in [facility] x one month and has been free from that lifestyle.  Has plans to transition into a halfway home to learn about structuring her life differently.  Reports up to 5 sexual partners daily and would typically vaginally douche after each.  Has chronic female issues because of this practice.

## 2013-02-02 NOTE — ED Notes (Signed)
Patient here with swelling in neck glands, minimal sore throat x 1 week. Also here for draining abscess to right side of labia, reports pain to same.

## 2013-02-02 NOTE — ED Provider Notes (Signed)
CSN: [id number]     Arrival date & time 02/02/13  1802 History   First MD Initiated Contact with Patient 02/02/13 1900     Chief Complaint  Patient presents with  . Abscess   (Consider location/radiation/quality/duration/timing/severity/associated sxs/prior Treatment) Patient is a 47 y.o. female presenting with abscess. The history is provided by the patient. No language interpreter was used.  Abscess Location:  Ano-genital Ano-genital abscess location:  Vulva Abscess quality: draining and painful   Red streaking: no   Duration:  1 week Chronicity:  New Associated symptoms: no fever   Associated symptoms comment:  Small painful area of vulvar swelling with persistent drainage. She also complains of swollen neck glands and intermittent sore throat. No fever, congestion, cough. No difficulty swallowing.    Past Medical History  Diagnosis Date  . Hernia   . Hepatitis C   . Drug abuse and dependence   . Depression   . H/O hiatal hernia    Past Surgical History  Procedure Laterality Date  . Splenectomy    . Tubal ligation     No family history on file. History  Substance Use Topics  . Smoking status: Current Every Day Smoker -- 1.00 packs/day for 20 years    Types: Cigarettes  . Smokeless tobacco: Not on file  . Alcohol Use: Yes     Comment: 1 pt daily   OB History   Grav Para Term Preterm Abortions TAB SAB Ect Mult Living                 Review of Systems  Constitutional: Negative for fever.  HENT: Negative for congestion.   Respiratory: Negative for cough.   Cardiovascular: Negative for chest pain.  Gastrointestinal: Negative for abdominal pain.  Genitourinary: Positive for vaginal discharge and genital sores. Negative for dysuria.    Allergies  Review of patient's allergies indicates no known allergies.  Home Medications   Current Outpatient Rx  Name  Route  Sig  Dispense  Refill  . FLUoxetine (PROZAC) 20 MG capsule   Oral   Take 20 mg by mouth daily.         . QUEtiapine (SEROQUEL) 100 MG tablet   Oral   Take 100 mg by mouth at bedtime.          BP 118/82  Pulse 93  Temp(Src) 99.1 F (37.3 C) (Oral)  Resp 18  Wt 142 lb (64.411 kg)  SpO2 98%  LMP 01/21/2013 Physical Exam  Constitutional: She is oriented to person, place, and time. She appears well-developed and well-nourished.  Neck: Normal range of motion.  Pulmonary/Chest: Effort normal.  Genitourinary:  Small nodular vulvar swelling that is tender. There is a small area of persistent purulent drainage. Small amount of discharge at the vaginal introitus. Speculum exam reveals minor discharge present in the vaginal vault. No CMT, adnexal tenderness or mass.   Neurological: She is alert and oriented to person, place, and time.  Skin: Skin is warm and dry.    ED Course  Procedures (including critical care time) Labs Review Labs Reviewed  WET PREP, GENITAL - Abnormal; Notable for the following:    Clue Cells Wet Prep HPF POC FEW (*)    WBC, Wet Prep HPF POC FEW (*)    All other components within normal limits  GC/CHLAMYDIA PROBE AMP   Imaging Review No results found.  EKG Interpretation   None     INCISION AND DRAINAGE Performed by: Elpidio Anis A Consent: Verbal consent  obtained. Risks and benefits: risks, benefits and alternatives were discussed Type: abscess  Body area: vulva  Anesthesia: local infiltration  Incision was made with a scalpel.  Local anesthetic: lidocaine 1% w/o epinephrine  Anesthetic total: 1 ml  Complexity: complex Blunt dissection to break up loculations  Drainage: purulent  Drainage amount: small  Packing material: none  Patient tolerance: Patient tolerated the procedure well with no immediate complications.     MDM   1. Vulvar abscess    Uncomplicated abscess of vulva that was I&D'd without difficulty. Stable for discharge.     Arnoldo Hooker, PA-C 02/02/13 2207

## 2013-02-04 LAB — GC/CHLAMYDIA PROBE AMP: CT Probe RNA: NEGATIVE

## 2013-02-04 NOTE — ED Provider Notes (Signed)
Medical screening examination/treatment/procedure(s) were performed by non-physician practitioner and as supervising physician I was immediately available for consultation/collaboration.  EKG Interpretation   None         Charles B. Sheldon, MD 02/04/13 2019 

## 2013-02-10 ENCOUNTER — Inpatient Hospital Stay (HOSPITAL_BASED_OUTPATIENT_CLINIC_OR_DEPARTMENT_OTHER)
Admission: EM | Admit: 2013-02-10 | Discharge: 2013-02-15 | DRG: 336 | Disposition: A | Discharge status: Other Acute Inpatient Hospital | Payer: Self-pay | Attending: Surgery | Admitting: Surgery

## 2013-02-10 ENCOUNTER — Encounter (HOSPITAL_COMMUNITY)
Admission: EM | Disposition: A | Discharge status: Other Acute Inpatient Hospital | Payer: Self-pay | Source: Home / Self Care

## 2013-02-10 ENCOUNTER — Emergency Department (HOSPITAL_COMMUNITY): Payer: Self-pay

## 2013-02-10 ENCOUNTER — Encounter (HOSPITAL_COMMUNITY): Payer: Self-pay | Admitting: Anesthesiology

## 2013-02-10 ENCOUNTER — Encounter (HOSPITAL_BASED_OUTPATIENT_CLINIC_OR_DEPARTMENT_OTHER): Payer: Self-pay | Admitting: Emergency Medicine

## 2013-02-10 ENCOUNTER — Observation Stay (HOSPITAL_COMMUNITY): Payer: Self-pay

## 2013-02-10 ENCOUNTER — Observation Stay (HOSPITAL_COMMUNITY): Payer: Self-pay | Admitting: Anesthesiology

## 2013-02-10 DIAGNOSIS — F329 Major depressive disorder, single episode, unspecified: Secondary | ICD-10-CM | POA: Diagnosis present

## 2013-02-10 DIAGNOSIS — F1410 Cocaine abuse, uncomplicated: Secondary | ICD-10-CM | POA: Diagnosis present

## 2013-02-10 DIAGNOSIS — F1920 Other psychoactive substance dependence, uncomplicated: Secondary | ICD-10-CM | POA: Diagnosis present

## 2013-02-10 DIAGNOSIS — Z79899 Other long term (current) drug therapy: Secondary | ICD-10-CM

## 2013-02-10 DIAGNOSIS — K560 Paralytic ileus: Secondary | ICD-10-CM | POA: Diagnosis not present

## 2013-02-10 DIAGNOSIS — Z9089 Acquired absence of other organs: Secondary | ICD-10-CM

## 2013-02-10 DIAGNOSIS — K430 Incisional hernia with obstruction, without gangrene: Secondary | ICD-10-CM

## 2013-02-10 DIAGNOSIS — B1920 Unspecified viral hepatitis C without hepatic coma: Secondary | ICD-10-CM | POA: Diagnosis present

## 2013-02-10 DIAGNOSIS — K567 Ileus, unspecified: Secondary | ICD-10-CM

## 2013-02-10 DIAGNOSIS — F3289 Other specified depressive episodes: Secondary | ICD-10-CM | POA: Diagnosis present

## 2013-02-10 DIAGNOSIS — K449 Diaphragmatic hernia without obstruction or gangrene: Secondary | ICD-10-CM | POA: Diagnosis present

## 2013-02-10 DIAGNOSIS — F17200 Nicotine dependence, unspecified, uncomplicated: Secondary | ICD-10-CM | POA: Diagnosis present

## 2013-02-10 DIAGNOSIS — K436 Other and unspecified ventral hernia with obstruction, without gangrene: Secondary | ICD-10-CM

## 2013-02-10 DIAGNOSIS — Y838 Other surgical procedures as the cause of abnormal reaction of the patient, or of later complication, without mention of misadventure at the time of the procedure: Secondary | ICD-10-CM | POA: Diagnosis not present

## 2013-02-10 DIAGNOSIS — K929 Disease of digestive system, unspecified: Secondary | ICD-10-CM | POA: Diagnosis not present

## 2013-02-10 DIAGNOSIS — F1210 Cannabis abuse, uncomplicated: Secondary | ICD-10-CM | POA: Diagnosis present

## 2013-02-10 DIAGNOSIS — B182 Chronic viral hepatitis C: Secondary | ICD-10-CM | POA: Diagnosis present

## 2013-02-10 HISTORY — PX: DIAGNOSTIC LAPAROSCOPIC LIVER BIOPSY: SHX5797

## 2013-02-10 LAB — CBC WITH DIFFERENTIAL/PLATELET
Basophils Absolute: 0 10*3/uL (ref 0.0–0.1)
Basophils Relative: 0 % (ref 0–1)
Eosinophils Absolute: 0.5 10*3/uL (ref 0.0–0.7)
HCT: 40.1 % (ref 36.0–46.0)
Hemoglobin: 13.8 g/dL (ref 12.0–15.0)
MCH: 31.2 pg (ref 26.0–34.0)
MCHC: 34.4 g/dL (ref 30.0–36.0)
Monocytes Absolute: 1.4 10*3/uL — ABNORMAL HIGH (ref 0.1–1.0)
Neutro Abs: 5.5 10*3/uL (ref 1.7–7.7)
Neutrophils Relative %: 43 % (ref 43–77)
RDW: 12.7 % (ref 11.5–15.5)

## 2013-02-10 LAB — BASIC METABOLIC PANEL
Calcium: 11 mg/dL — ABNORMAL HIGH (ref 8.4–10.5)
Chloride: 104 mEq/L (ref 96–112)
Creatinine, Ser: 0.7 mg/dL (ref 0.50–1.10)
GFR calc Af Amer: >90 mL/min (ref >90)
GFR calc non Af Amer: >90 mL/min (ref >90)
Sodium: 137 mEq/L (ref 135–145)

## 2013-02-10 LAB — SURGICAL PCR SCREEN
MRSA, PCR: NEGATIVE
Staphylococcus aureus: NEGATIVE

## 2013-02-10 LAB — HEPATIC FUNCTION PANEL
ALT: 12 U/L (ref 0–35)
Alkaline Phosphatase: 98 U/L (ref 39–117)
Bilirubin, Direct: <0.1 mg/dL (ref 0.0–0.3)
Total Bilirubin: 0.3 mg/dL (ref 0.3–1.2)
Total Protein: 7.8 g/dL (ref 6.0–8.3)

## 2013-02-10 LAB — PROTIME-INR
INR: 1.13 (ref 0.00–1.49)
Prothrombin Time: 14.3 seconds (ref 11.6–15.2)

## 2013-02-10 SURGERY — BIOPSY, LIVER, LAPAROSCOPIC
Anesthesia: General | Site: Abdomen

## 2013-02-10 MED ORDER — METRONIDAZOLE IN NACL 5-0.79 MG/ML-% IV SOLN
500.0000 mg | INTRAVENOUS | Status: AC
  Administered 2013-02-10: 500 mg via INTRAVENOUS
  Filled 2013-02-10: qty 100

## 2013-02-10 MED ORDER — METOPROLOL TARTRATE 1 MG/ML IV SOLN
5.0000 mg | Freq: Four times a day (QID) | INTRAVENOUS | Status: DC | PRN
  Filled 2013-02-10: qty 5

## 2013-02-10 MED ORDER — BUPIVACAINE-EPINEPHRINE PF 0.25-1:200000 % IJ SOLN
INTRAMUSCULAR | Status: DC | PRN
  Administered 2013-02-10: 15 mL

## 2013-02-10 MED ORDER — IOHEXOL 300 MG/ML  SOLN
100.0000 mL | Freq: Once | INTRAMUSCULAR | Status: AC | PRN
  Administered 2013-02-10: 100 mL via INTRAVENOUS

## 2013-02-10 MED ORDER — METRONIDAZOLE IN NACL 5-0.79 MG/ML-% IV SOLN
INTRAVENOUS | Status: AC
  Filled 2013-02-10: qty 100

## 2013-02-10 MED ORDER — DEXAMETHASONE SODIUM PHOSPHATE 10 MG/ML IJ SOLN
INTRAMUSCULAR | Status: AC
  Filled 2013-02-10: qty 1

## 2013-02-10 MED ORDER — ONDANSETRON HCL 4 MG/2ML IJ SOLN
INTRAMUSCULAR | Status: AC
  Administered 2013-02-10: 4 mg via INTRAVENOUS
  Filled 2013-02-10: qty 2

## 2013-02-10 MED ORDER — ONDANSETRON HCL 4 MG/2ML IJ SOLN
4.0000 mg | Freq: Four times a day (QID) | INTRAMUSCULAR | Status: DC | PRN
  Administered 2013-02-11: 4 mg via INTRAVENOUS
  Filled 2013-02-10: qty 2

## 2013-02-10 MED ORDER — BISACODYL 10 MG RE SUPP: 10.0000 mg | Freq: Two times a day (BID) | RECTAL | Status: DC | PRN

## 2013-02-10 MED ORDER — SUCCINYLCHOLINE CHLORIDE 20 MG/ML IJ SOLN
INTRAMUSCULAR | Status: DC | PRN
  Administered 2013-02-10: 100 mg via INTRAVENOUS

## 2013-02-10 MED ORDER — BUPIVACAINE-EPINEPHRINE 0.25% -1:200000 IJ SOLN
INTRAMUSCULAR | Status: AC
  Filled 2013-02-10: qty 1

## 2013-02-10 MED ORDER — GLYCOPYRROLATE 0.2 MG/ML IJ SOLN
INTRAMUSCULAR | Status: AC
  Filled 2013-02-10: qty 3

## 2013-02-10 MED ORDER — HYDROMORPHONE HCL PF 1 MG/ML IJ SOLN
0.5000 mg | INTRAMUSCULAR | Status: DC | PRN
  Administered 2013-02-11 – 2013-02-12 (×9): 1 mg via INTRAVENOUS
  Administered 2013-02-13 (×2): 2 mg via INTRAVENOUS
  Filled 2013-02-10 (×3): qty 1
  Filled 2013-02-10: qty 2
  Filled 2013-02-10 (×6): qty 1
  Filled 2013-02-10: qty 2

## 2013-02-10 MED ORDER — LACTATED RINGERS IV BOLUS (SEPSIS): 1000.0000 mL | Freq: Three times a day (TID) | INTRAVENOUS | Status: AC | PRN

## 2013-02-10 MED ORDER — LIP MEDEX EX OINT
1.0000 "application " | TOPICAL_OINTMENT | Freq: Two times a day (BID) | CUTANEOUS | Status: DC
  Administered 2013-02-10 – 2013-02-14 (×7): 1 via TOPICAL
  Filled 2013-02-10 (×2): qty 7

## 2013-02-10 MED ORDER — FENTANYL CITRATE 0.05 MG/ML IJ SOLN
INTRAMUSCULAR | Status: DC | PRN
  Administered 2013-02-10: 50 ug via INTRAVENOUS
  Administered 2013-02-10 – 2013-02-11 (×3): 100 ug via INTRAVENOUS

## 2013-02-10 MED ORDER — LORAZEPAM 2 MG/ML IJ SOLN: 0.5000 mg | Freq: Three times a day (TID) | INTRAMUSCULAR | Status: DC | PRN

## 2013-02-10 MED ORDER — CEFAZOLIN SODIUM-DEXTROSE 2-3 GM-% IV SOLR
INTRAVENOUS | Status: AC
  Filled 2013-02-10: qty 50

## 2013-02-10 MED ORDER — DIPHENHYDRAMINE HCL 50 MG/ML IJ SOLN
12.5000 mg | Freq: Four times a day (QID) | INTRAMUSCULAR | Status: DC | PRN
  Administered 2013-02-10: 12.5 mg via INTRAVENOUS
  Administered 2013-02-13: 25 mg via INTRAVENOUS
  Filled 2013-02-10 (×2): qty 1

## 2013-02-10 MED ORDER — KETOROLAC TROMETHAMINE 30 MG/ML IJ SOLN
INTRAMUSCULAR | Status: AC
  Filled 2013-02-10: qty 1

## 2013-02-10 MED ORDER — ROCURONIUM BROMIDE 100 MG/10ML IV SOLN
INTRAVENOUS | Status: DC | PRN
  Administered 2013-02-10: 10 mg via INTRAVENOUS
  Administered 2013-02-10: 30 mg via INTRAVENOUS
  Administered 2013-02-10: 10 mg via INTRAVENOUS

## 2013-02-10 MED ORDER — MAGIC MOUTHWASH
15.0000 mL | Freq: Four times a day (QID) | ORAL | Status: DC | PRN
  Filled 2013-02-10: qty 15

## 2013-02-10 MED ORDER — BUPIVACAINE-EPINEPHRINE PF 0.25-1:200000 % IJ SOLN
INTRAMUSCULAR | Status: AC
  Filled 2013-02-10: qty 30

## 2013-02-10 MED ORDER — HYDROMORPHONE HCL PF 1 MG/ML IJ SOLN
INTRAMUSCULAR | Status: AC
  Filled 2013-02-10: qty 1

## 2013-02-10 MED ORDER — ACETAMINOPHEN 650 MG RE SUPP: 650.0000 mg | Freq: Four times a day (QID) | RECTAL | Status: DC | PRN

## 2013-02-10 MED ORDER — ONDANSETRON HCL 4 MG/2ML IJ SOLN
4.0000 mg | Freq: Once | INTRAMUSCULAR | Status: AC
  Administered 2013-02-10: 4 mg via INTRAVENOUS

## 2013-02-10 MED ORDER — CEFAZOLIN SODIUM-DEXTROSE 2-3 GM-% IV SOLR
2.0000 g | INTRAVENOUS | Status: AC
  Administered 2013-02-10: 2 g via INTRAVENOUS
  Filled 2013-02-10: qty 50

## 2013-02-10 MED ORDER — NEOSTIGMINE METHYLSULFATE 1 MG/ML IJ SOLN
INTRAMUSCULAR | Status: AC
  Filled 2013-02-10: qty 10

## 2013-02-10 MED ORDER — SUCCINYLCHOLINE CHLORIDE 20 MG/ML IJ SOLN
INTRAMUSCULAR | Status: AC
  Filled 2013-02-10: qty 1

## 2013-02-10 MED ORDER — LIDOCAINE HCL (CARDIAC) 20 MG/ML IV SOLN
INTRAVENOUS | Status: AC
  Filled 2013-02-10: qty 5

## 2013-02-10 MED ORDER — DEXAMETHASONE SODIUM PHOSPHATE 10 MG/ML IJ SOLN
INTRAMUSCULAR | Status: DC | PRN
  Administered 2013-02-10: 5 mg via INTRAVENOUS

## 2013-02-10 MED ORDER — 0.9 % SODIUM CHLORIDE (POUR BTL) OPTIME
TOPICAL | Status: DC | PRN
  Administered 2013-02-10: 1000 mL

## 2013-02-10 MED ORDER — POTASSIUM CHLORIDE IN NACL 20-0.9 MEQ/L-% IV SOLN
INTRAVENOUS | Status: DC
  Administered 2013-02-10: 19:00:00 via INTRAVENOUS
  Filled 2013-02-10 (×3): qty 1000

## 2013-02-10 MED ORDER — LIDOCAINE HCL (PF) 2 % IJ SOLN
INTRAMUSCULAR | Status: DC | PRN
  Administered 2013-02-10: 75 mg via INTRADERMAL

## 2013-02-10 MED ORDER — PROPOFOL 10 MG/ML IV BOLUS
INTRAVENOUS | Status: AC
  Filled 2013-02-10: qty 20

## 2013-02-10 MED ORDER — CHLORHEXIDINE GLUCONATE 4 % EX LIQD
1.0000 "application " | Freq: Once | CUTANEOUS | Status: DC
  Filled 2013-02-10: qty 15

## 2013-02-10 MED ORDER — FENTANYL CITRATE 0.05 MG/ML IJ SOLN
INTRAMUSCULAR | Status: AC
  Filled 2013-02-10: qty 5

## 2013-02-10 MED ORDER — BUPIVACAINE-EPINEPHRINE 0.25% -1:200000 IJ SOLN
INTRAMUSCULAR | Status: DC | PRN
  Administered 2013-02-10: 50 mL

## 2013-02-10 MED ORDER — HYDROMORPHONE HCL PF 1 MG/ML IJ SOLN
1.0000 mg | Freq: Once | INTRAMUSCULAR | Status: AC
  Administered 2013-02-10: 1 mg via INTRAVENOUS

## 2013-02-10 MED ORDER — HYDROMORPHONE HCL PF 1 MG/ML IJ SOLN
0.5000 mg | INTRAMUSCULAR | Status: DC | PRN
  Administered 2013-02-10: 1 mg via INTRAVENOUS
  Filled 2013-02-10 (×2): qty 1

## 2013-02-10 MED ORDER — HYDROMORPHONE HCL PF 1 MG/ML IJ SOLN
0.5000 mg | INTRAMUSCULAR | Status: DC | PRN
  Administered 2013-02-10: 1 mg via INTRAVENOUS
  Filled 2013-02-10: qty 1

## 2013-02-10 MED ORDER — ALUM & MAG HYDROXIDE-SIMETH 200-200-20 MG/5ML PO SUSP
30.0000 mL | Freq: Four times a day (QID) | ORAL | Status: DC | PRN
  Administered 2013-02-11 – 2013-02-12 (×2): 30 mL via ORAL
  Filled 2013-02-10 (×2): qty 30

## 2013-02-10 MED ORDER — PROMETHAZINE HCL 25 MG/ML IJ SOLN
12.5000 mg | Freq: Four times a day (QID) | INTRAMUSCULAR | Status: DC | PRN
  Administered 2013-02-12: 25 mg via INTRAVENOUS
  Administered 2013-02-14: 12.5 mg via INTRAVENOUS
  Filled 2013-02-10 (×2): qty 1

## 2013-02-10 MED ORDER — PROPOFOL 10 MG/ML IV BOLUS
INTRAVENOUS | Status: DC | PRN
  Administered 2013-02-10: 200 mg via INTRAVENOUS

## 2013-02-10 MED ORDER — ACETAMINOPHEN 325 MG PO TABS: 650.0000 mg | ORAL_TABLET | Freq: Four times a day (QID) | ORAL | Status: DC | PRN

## 2013-02-10 MED ORDER — IOHEXOL 300 MG/ML  SOLN
50.0000 mL | Freq: Once | INTRAMUSCULAR | Status: AC | PRN
  Administered 2013-02-10: 50 mL via ORAL

## 2013-02-10 MED ORDER — LACTATED RINGERS IV SOLN
INTRAVENOUS | Status: DC | PRN
  Administered 2013-02-10 (×2): via INTRAVENOUS

## 2013-02-10 MED ORDER — ONDANSETRON HCL 4 MG/2ML IJ SOLN
INTRAMUSCULAR | Status: AC
  Filled 2013-02-10: qty 2

## 2013-02-10 SURGICAL SUPPLY — 69 items
APPLIER CLIP ROT 10 11.4 M/L (STAPLE)
BINDER ABD UNIV 12 45-62 (WOUND CARE) ×2 IMPLANT
BINDER ABDOMINAL 46IN 62IN (WOUND CARE) ×3
BLADE EXTENDED COATED 6.5IN (ELECTRODE) IMPLANT
BLADE HEX COATED 2.75 (ELECTRODE) ×6 IMPLANT
BLADE SURG SZ10 CARB STEEL (BLADE) ×3 IMPLANT
CANISTER SUCTION 2500CC (MISCELLANEOUS) ×3 IMPLANT
CATH KIT ON Q 7.5IN SLV (PAIN MANAGEMENT) IMPLANT
CLIP APPLIE ROT 10 11.4 M/L (STAPLE) IMPLANT
CORD HIGH FREQUENCY UNIPOLAR (ELECTROSURGICAL) ×3 IMPLANT
COUNTER NEEDLE 20 DBL MAG RED (NEEDLE) ×3 IMPLANT
COVER MAYO STAND STRL (DRAPES) ×6 IMPLANT
DECANTER SPIKE VIAL GLASS SM (MISCELLANEOUS) ×3 IMPLANT
DEVICE SECURE STRAP 25 ABSORB (INSTRUMENTS) ×3 IMPLANT
DRAIN CHANNEL 19F RND (DRAIN) IMPLANT
DRAPE LAPAROSCOPIC ABDOMINAL (DRAPES) ×3 IMPLANT
DRAPE LG THREE QUARTER DISP (DRAPES) IMPLANT
DRAPE UTILITY XL STRL (DRAPES) ×6 IMPLANT
DRAPE WARM FLUID 44X44 (DRAPE) ×3 IMPLANT
DRSG OPSITE POSTOP 4X10 (GAUZE/BANDAGES/DRESSINGS) IMPLANT
DRSG OPSITE POSTOP 4X6 (GAUZE/BANDAGES/DRESSINGS) IMPLANT
DRSG OPSITE POSTOP 4X8 (GAUZE/BANDAGES/DRESSINGS) IMPLANT
DRSG TEGADERM 2-3/8X2-3/4 SM (GAUZE/BANDAGES/DRESSINGS) ×6 IMPLANT
DRSG TEGADERM 4X4.75 (GAUZE/BANDAGES/DRESSINGS) ×3 IMPLANT
ELECT REM PT RETURN 9FT ADLT (ELECTROSURGICAL) ×3
ELECTRODE REM PT RTRN 9FT ADLT (ELECTROSURGICAL) ×2 IMPLANT
ENDOLOOP SUT PDS II  0 18 (SUTURE)
ENDOLOOP SUT PDS II 0 18 (SUTURE) IMPLANT
GAUZE SPONGE 2X2 8PLY STRL LF (GAUZE/BANDAGES/DRESSINGS) ×2 IMPLANT
GLOVE ECLIPSE 8.0 STRL XLNG CF (GLOVE) ×6 IMPLANT
GLOVE INDICATOR 8.0 STRL GRN (GLOVE) ×6 IMPLANT
GOWN STRL REIN XL XLG (GOWN DISPOSABLE) ×12 IMPLANT
KIT BASIN OR (CUSTOM PROCEDURE TRAY) ×3 IMPLANT
LEGGING LITHOTOMY PAIR STRL (DRAPES) ×3 IMPLANT
LIGASURE IMPACT 36 18CM CVD LR (INSTRUMENTS) IMPLANT
MESH VENTRALIGHT ST 6X8 (Mesh Specialty) ×1 IMPLANT
MESH VENTRLGHT ELLIPSE 8X6XMFL (Mesh Specialty) ×2 IMPLANT
NEEDLE BIOPSY 14GX4.5 SOFT TIS (NEEDLE) IMPLANT
NEEDLE BIOPSY 14X6 SOFT TISS (NEEDLE) IMPLANT
NS IRRIG 1000ML POUR BTL (IV SOLUTION) ×3 IMPLANT
PACK GENERAL/GYN (CUSTOM PROCEDURE TRAY) IMPLANT
PENCIL BUTTON HOLSTER BLD 10FT (ELECTRODE) ×3 IMPLANT
SCISSORS ENDO CVD 5DCS (MISCELLANEOUS) ×3 IMPLANT
SCISSORS LAP 5X35 DISP (ENDOMECHANICALS) ×3 IMPLANT
SET IRRIG TUBING LAPAROSCOPIC (IRRIGATION / IRRIGATOR) ×3 IMPLANT
SPONGE GAUZE 2X2 STER 10/PKG (GAUZE/BANDAGES/DRESSINGS) ×1
SPONGE GAUZE 4X4 12PLY (GAUZE/BANDAGES/DRESSINGS) IMPLANT
STAPLER VISISTAT 35W (STAPLE) IMPLANT
SUCTION POOLE TIP (SUCTIONS) IMPLANT
SUT MNCRL AB 4-0 PS2 18 (SUTURE) ×3 IMPLANT
SUT PDS AB 1 CTX 36 (SUTURE) IMPLANT
SUT PDS AB 1 TP1 96 (SUTURE) IMPLANT
SUT PROLENE 1 CT 1 30 (SUTURE) ×21 IMPLANT
SUT SILK 2 0 (SUTURE)
SUT SILK 2 0SH CR/8 30 (SUTURE) IMPLANT
SUT SILK 2-0 18XBRD TIE 12 (SUTURE) IMPLANT
SUT SILK 3 0 (SUTURE)
SUT SILK 3 0 SH CR/8 (SUTURE) IMPLANT
SUT SILK 3-0 18XBRD TIE 12 (SUTURE) IMPLANT
TAPE UMBILICAL COTTON 1/8X30 (MISCELLANEOUS) IMPLANT
TOWEL OR 17X26 10 PK STRL BLUE (TOWEL DISPOSABLE) ×6 IMPLANT
TOWEL OR NON WOVEN STRL DISP B (DISPOSABLE) ×6 IMPLANT
TRAY FOLEY CATH 14FRSI W/METER (CATHETERS) ×3 IMPLANT
TRAY LAP CHOLE (CUSTOM PROCEDURE TRAY) ×3 IMPLANT
TROCAR XCEL NON-BLD 11X100MML (ENDOMECHANICALS) IMPLANT
TROCAR XCEL NON-BLD 5MMX100MML (ENDOMECHANICALS) ×3 IMPLANT
TUBING INSUFFLATION 10FT LAP (TUBING) ×3 IMPLANT
TUNNELER SHEATH ON-Q 16GX12 DP (PAIN MANAGEMENT) IMPLANT
YANKAUER SUCT BULB TIP 10FT TU (MISCELLANEOUS) IMPLANT

## 2013-02-10 NOTE — H&P (Signed)
Seen by Dr. Daphine Deutscher around 545pm.  CT scan recommended.  Patient nontoxic at that time. Admitted to floor. CT scan done.    General: Pt awake/alert/oriented x4 in mild acute distress Eyes: PERRL, normal EOM. Sclera nonicteric Neuro: CN II-XII intact w/o focal sensory/motor deficits. Lymph: No head/neck/groin lymphadenopathy Psych:  No delerium/psychosis/paranoia.  Anxious but concolable HENT: Normocephalic, Mucus membranes moist.  No thrush Neck: Supple, No tracheal deviation Chest: No pain.  Good respiratory excursion. CV:  Pulses intact.  Regular rhythm MS: Normal AROM mjr joints.  No obvious deformity Abdomen: Soft, mildly distended.  Incarcerated mass 5cm above umbilicus - very tender. Ext:  SCDs BLE.  No significant edema.  No cyanosis Skin: No petechiae / purpura  Consistent with incarcerated small bowel into incisional hernia.  Edema associated with it.  No perforation or free air.  Ct Abdomen Pelvis W Contrast  02/10/2013   CLINICAL DATA:  Mid abdominal pain, concern for incarcerated hernia  EXAM: CT ABDOMEN AND PELVIS WITH CONTRAST  TECHNIQUE: Multidetector CT imaging of the abdomen and pelvis was performed using the standard protocol following bolus administration of intravenous contrast.  CONTRAST:  50mL OMNIPAQUE IOHEXOL 300 MG/ML SOLN, OMNIPAQUE IOHEXOL 300 MG/ML SOLN  COMPARISON:  None.  FINDINGS: There is mild diffuse fatty infiltration of the liver with no focal hepatic abnormalities. The gallbladder is normal. 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 There is minimal dilatation of the pancreatic duct is diffusely with no evidence of peripancreatic inflammation or pancreatic mass. Adrenal glands are normal. Kidneys are normal, except for a few small right renal nonobstructing calculi. There are approximately 3 stones on the right with the largest measuring 5 mm, seen in the midpole.  The aorta is not dilated. There is no ascites. The bladder is normal.  In the left adnexa, there is  a peripherally enhancing 2 cm cystic lesion. There is trace free fluid in the cul-de-sac. The uterus is mildly prominent at 6.5 x 5.7 by 11.4 cm. No focal uterine masses are identified, but diffuse leiomyomatous involvement could have this appearance.  There are no abnormally dilated loops of bowel. There is a periumbilical hernia containing a loop of small bowel. This loop of small bowel shows narrowing at the hernia and neck, with mild inflammatory change with scan the hernia sac.  The visualized portions of the lung bases are clear except for mild dependent atelectasis bilaterally. There is severe degenerative facet change at L5-S1 with grade 2 anterior listhesis of L5 on the sacrum, of about 11 mm.  IMPRESSION: 1. Short segment of small bowel involved in a periumbilical abdominal wall hernia. Mild inflammatory change and wall thickening suggest the possibility of strangulation. 2. 1 cm of anterior listhesis of L5 on the sacrum due to severe degenerative facet change. 3. 2 cm peripherally enhancing cystic lesion low in the left adnexa. This likely represents a collapsed hemorrhagic left ovarian cyst associated with a physiologic volume of free fluid in the cul-de-sac. 4. Mild pancreatic ductal prominence of uncertain etiology. It may be reasonable to correlate with appropriate laboratory values.   Electronically Signed   By: Esperanza Heir M.D.   On: 02/10/2013 19:17   Dg Abd Acute W/chest  02/10/2013   CLINICAL DATA:  Abdominal pain and nausea and vomiting. Some lower chest pain and shortness of breath.  EXAM: ACUTE ABDOMEN SERIES (ABDOMEN 2 VIEW & CHEST 1 VIEW)  COMPARISON:  Chest x-ray dated 06/14/2012  FINDINGS: Heart and lungs appear essentially normal. No free air  or free fluid in the abdomen. Bowel gas pattern is normal. Small calcification in the mid right kidney. Facet arthritis at L5-S1 bilaterally.  IMPRESSION: No acute abnormalities.  Small stone in the right kidney.   Electronically Signed   By:  Geanie Cooley M.D.   On: 02/10/2013 17:43   Patient with a painful incarcerated hernia containing small bowel showing edema.  Concern for strangulation.  Patient requires urgent surgical exploration.  Reasonable start out with diagnostic laparoscopy.  If able reduce and no evidence of any abscess or infection or necrosis, proceed with repair with mesh.  It is any evidence of infection or necrosis, performed small bowel resection and do primary repair, and knowing the risk of hernia recurrence higher.  I discussed the procedure with the patient.  She was anxious but consolable.  She agrees to proceed:  The anatomy & physiology of the abdominal wall was discussed.  The pathophysiology of hernias was discussed.  Natural history risks without surgery including progressive enlargement, pain, incarceration & strangulation was discussed.   Contributors to complications such as smoking, obesity, diabetes, prior surgery, etc were discussed.   I feel the risks of no intervention will lead to serious problems that outweigh the operative risks; therefore, I recommended surgery to reduce and repair the hernia emergently.  I explained laparoscopic techniques with possible need for an open approach.  I noted the probable use of mesh to patch and/or buttress the hernia repair  Risks such as bleeding, infection, abscess, need for further treatment, heart attack, death, and other risks were discussed.  I noted a good likelihood this will help address the problem.   Goals of post-operative recovery were discussed as well.  Possibility that this will not correct all symptoms was explained.  I stressed the importance of low-impact activity, aggressive pain control, avoiding constipation, & not pushing through pain to minimize risk of post-operative chronic pain or injury. Possibility of reherniation especially with smoking, obesity, diabetes, immunosuppression, and other health conditions was discussed.  We will work to minimize  complications.     Questions were answered.  The patient expresses understanding & wishes to proceed with surgery.

## 2013-02-10 NOTE — ED Notes (Signed)
Bed: RU04 Expected date:  Expected time:  Means of arrival:  Comments: [facility] pt

## 2013-02-10 NOTE — ED Notes (Signed)
MD at bedside. 

## 2013-02-10 NOTE — ED Notes (Signed)
Patient is being transferred to the Ed at [place] [facility]

## 2013-02-10 NOTE — Progress Notes (Signed)
[facility] provided pt with a [facility] [employer] Card application.  °

## 2013-02-10 NOTE — ED Notes (Signed)
Pt reports that she has been at [facility] for 2 weeks.  Pt has hx of hernia.  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  Pt noted to have a large umbilical hernia that is hard to touch.  Pt denies vomiting and diarrhea.  Reports nausea.

## 2013-02-10 NOTE — ED Notes (Signed)
Report

## 2013-02-10 NOTE — ED Provider Notes (Signed)
CSN: [id number]     Arrival date & time 02/10/13  1246 History   First MD Initiated Contact with Patient 02/10/13 1257     Chief Complaint  Patient presents with  . Abdominal Pain   (Consider location/radiation/quality/duration/timing/severity/associated sxs/prior Treatment) Patient is a 47 y.o. female presenting with abdominal pain. The history is provided by the patient.  Abdominal Pain Pain location:  Periumbilical Pain quality: sharp, squeezing, stabbing and throbbing   Pain radiates to:  Does not radiate Pain severity:  Severe Onset quality:  Sudden Duration:  1 hour Timing:  Constant Progression:  Worsening Chronicity:  New Context comment:  Ventral hernia for years which was always easily reducible until today and now hard and can't push it in Relieved by:  Nothing Worsened by:  Nothing tried Ineffective treatments:  None tried Associated symptoms: nausea and vomiting   Associated symptoms: no chest pain and no diarrhea   Risk factors: alcohol abuse   Risk factors comment:  Drug abuse.  prior surgery for spleen removal   Past Medical History  Diagnosis Date  . Hernia   . Hepatitis C   . Drug abuse and dependence   . Depression   . H/O hiatal hernia    Past Surgical History  Procedure Laterality Date  . Splenectomy    . Tubal ligation     History reviewed. No pertinent family history. History  Substance Use Topics  . Smoking status: Current Every Day Smoker -- 1.00 packs/day for 20 years    Types: Cigarettes  . Smokeless tobacco: Not on file  . Alcohol Use: Yes     Comment: 1 pt daily   OB History   Grav Para Term Preterm Abortions TAB SAB Ect Mult Living                 Review of Systems  Cardiovascular: Negative for chest pain.  Gastrointestinal: Positive for nausea, vomiting and abdominal pain. Negative for diarrhea.  All other systems reviewed and are negative.    Allergies  Review of patient's allergies indicates no known allergies.  Home  Medications   Current Outpatient Rx  Name  Route  Sig  Dispense  Refill  . FLUoxetine (PROZAC) 20 MG capsule   Oral   Take 20 mg by mouth daily.         . QUEtiapine (SEROQUEL) 100 MG tablet   Oral   Take 100 mg by mouth at bedtime.          BP 165/101  Pulse 72  Temp(Src) 97.9 F (36.6 C) (Oral)  Resp 18  Ht 5\' 4"  (1.626 m)  Wt 150 lb (68.04 kg)  BMI 25.73 kg/m2  SpO2 99%  LMP 01/21/2013 Physical Exam  Nursing note and vitals reviewed. Constitutional: She is oriented to person, place, and time. She appears well-developed and well-nourished. She appears distressed.  HENT:  Head: Normocephalic and atraumatic.  Mouth/Throat: Oropharynx is clear and moist.  Eyes: Conjunctivae and EOM are normal. Pupils are equal, round, and reactive to light.  Neck: Normal range of motion. Neck supple.  Cardiovascular: Normal rate, regular rhythm and intact distal pulses.   No murmur heard. Pulmonary/Chest: Effort normal and breath sounds normal. No respiratory distress. She has no wheezes. She has no rales.  Abdominal: Soft. She exhibits no distension. There is tenderness. There is no rebound and no guarding. A hernia is present. Hernia confirmed positive in the ventral area.    Incarcerated ventral hernia that is tense, nonreducible and tender  Musculoskeletal: Normal range of motion. She exhibits no edema and no tenderness.  Neurological: She is alert and oriented to person, place, and time.  Skin: Skin is warm and dry. No rash noted. No erythema.  Psychiatric: She has a normal mood and affect. Her behavior is normal.    ED Course  Procedures (including critical care time) Labs Review Labs Reviewed  CBC WITH DIFFERENTIAL - Abnormal; Notable for the following:    WBC 12.7 (*)    Lymphs Abs 5.3 (*)    Monocytes Absolute 1.4 (*)    All other components within normal limits  BASIC METABOLIC PANEL - Abnormal; Notable for the following:    Glucose, Bld 112 (*)    Calcium 11.0 (*)     All other components within normal limits  PROTIME-INR  APTT   Imaging Review No results found.  EKG Interpretation   None       MDM   1. Incarcerated ventral hernia     Pt here with evidence of incarcerated ventral hernia for the last 1 hour with vomiting but unable to reduce.  Will discuss with surgery. Pt denies anticoagulation  Pt sent to [place] for eval by surgery  Gwyneth Sprout, MD 02/10/13 1357

## 2013-02-10 NOTE — ED Provider Notes (Signed)
MSE was initiated and I personally evaluated the patient and placed orders (if any) at  3:08 PM on February 10, 2013.  The patient appears stable so that the remainder of the MSE may be completed by another provider. Patient is starting to have pain again. Surgery has been called.  Carlyle Dolly, PA-C 02/11/13 0139
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
## 2013-02-10 NOTE — ED Notes (Signed)
Patient transported to CT 

## 2013-02-10 NOTE — ED Notes (Signed)
Report called to [facility] charge nurse and carelink

## 2013-02-10 NOTE — Anesthesia Preprocedure Evaluation (Signed)
Anesthesia Evaluation  Patient identified by MRN, date of birth, ID band Patient awake    Reviewed: Allergy & Precautions, H&P , NPO status , Patient's Chart, lab work & pertinent test results  Airway Mallampati: II TM Distance: >3 FB Neck ROM: Full    Dental no notable dental hx.    Pulmonary Current Smoker,  breath sounds clear to auscultation  Pulmonary exam normal       Cardiovascular negative cardio ROS  Rhythm:Regular Rate:Normal     Neuro/Psych Depression negative neurological ROS     GI/Hepatic negative GI ROS, (+)     substance abuse  cocaine use, methamphetamine use and IV drug use, Hepatitis -, C  Endo/Other  negative endocrine ROS  Renal/GU negative Renal ROS  negative genitourinary   Musculoskeletal negative musculoskeletal ROS (+)   Abdominal   Peds negative pediatric ROS (+)  Hematology negative hematology ROS (+)   Anesthesia Other Findings   Reproductive/Obstetrics negative OB ROS                           Anesthesia Physical Anesthesia Plan  ASA: III and emergent  Anesthesia Plan: General   Post-op Pain Management:    Induction: Intravenous, Rapid sequence and Cricoid pressure planned  Airway Management Planned: Oral ETT  Additional Equipment:   Intra-op Plan:   Post-operative Plan: Extubation in OR  Informed Consent: I have reviewed the patients History and Physical, chart, labs and discussed the procedure including the risks, benefits and alternatives for the proposed anesthesia with the patient or authorized representative who has indicated his/her understanding and acceptance.   Dental advisory given  Plan Discussed with: CRNA and Surgeon  Anesthesia Plan Comments:         Anesthesia Quick Evaluation

## 2013-02-11 ENCOUNTER — Encounter (HOSPITAL_COMMUNITY): Payer: Self-pay

## 2013-02-11 DIAGNOSIS — B182 Chronic viral hepatitis C: Secondary | ICD-10-CM | POA: Diagnosis present

## 2013-02-11 DIAGNOSIS — F1920 Other psychoactive substance dependence, uncomplicated: Secondary | ICD-10-CM | POA: Insufficient documentation

## 2013-02-11 LAB — CBC
HCT: 36.5 % (ref 36.0–46.0)
Platelets: 297 10*3/uL (ref 150–400)
RBC: 4.07 MIL/uL (ref 3.87–5.11)
RDW: 12.9 % (ref 11.5–15.5)
WBC: 16.2 10*3/uL — ABNORMAL HIGH (ref 4.0–10.5)

## 2013-02-11 LAB — BASIC METABOLIC PANEL
Calcium: 10.3 mg/dL (ref 8.4–10.5)
Creatinine, Ser: 0.58 mg/dL (ref 0.50–1.10)
GFR calc Af Amer: >90 mL/min (ref >90)
GFR calc non Af Amer: >90 mL/min (ref >90)
Sodium: 133 mEq/L — ABNORMAL LOW (ref 135–145)

## 2013-02-11 MED ORDER — OXYCODONE HCL 5 MG PO TABS
5.0000 mg | ORAL_TABLET | ORAL | Status: DC | PRN
  Administered 2013-02-11 – 2013-02-15 (×13): 10 mg via ORAL
  Filled 2013-02-11 (×13): qty 2

## 2013-02-11 MED ORDER — POTASSIUM CHLORIDE IN NACL 20-0.9 MEQ/L-% IV SOLN
INTRAVENOUS | Status: DC
  Administered 2013-02-11 – 2013-02-12 (×5): via INTRAVENOUS
  Filled 2013-02-11 (×3): qty 1000

## 2013-02-11 MED ORDER — FENTANYL CITRATE 0.05 MG/ML IJ SOLN
INTRAMUSCULAR | Status: AC
  Filled 2013-02-11: qty 2

## 2013-02-11 MED ORDER — KETOROLAC TROMETHAMINE 30 MG/ML IJ SOLN
INTRAMUSCULAR | Status: DC | PRN
  Administered 2013-02-11: 30 mg via INTRAVENOUS

## 2013-02-11 MED ORDER — PROMETHAZINE HCL 25 MG/ML IJ SOLN
6.2500 mg | INTRAMUSCULAR | Status: DC | PRN
  Administered 2013-02-11: 12.5 mg via INTRAVENOUS

## 2013-02-11 MED ORDER — QUETIAPINE FUMARATE 100 MG PO TABS
100.0000 mg | ORAL_TABLET | Freq: Every day | ORAL | Status: DC
  Administered 2013-02-11 – 2013-02-14 (×3): 100 mg via ORAL
  Filled 2013-02-11 (×6): qty 1

## 2013-02-11 MED ORDER — ONDANSETRON HCL 4 MG/2ML IJ SOLN
INTRAMUSCULAR | Status: DC | PRN
  Administered 2013-02-11: 4 mg via INTRAVENOUS

## 2013-02-11 MED ORDER — METRONIDAZOLE IN NACL 5-0.79 MG/ML-% IV SOLN
500.0000 mg | Freq: Four times a day (QID) | INTRAVENOUS | Status: AC
  Administered 2013-02-11 (×3): 500 mg via INTRAVENOUS
  Filled 2013-02-11 (×3): qty 100

## 2013-02-11 MED ORDER — ACETAMINOPHEN 500 MG PO TABS
1000.0000 mg | ORAL_TABLET | Freq: Three times a day (TID) | ORAL | Status: DC
  Administered 2013-02-11 – 2013-02-15 (×11): 1000 mg via ORAL
  Filled 2013-02-11 (×16): qty 2

## 2013-02-11 MED ORDER — HYDROMORPHONE HCL PF 1 MG/ML IJ SOLN: 0.2500 mg | INTRAMUSCULAR | Status: DC | PRN

## 2013-02-11 MED ORDER — SACCHAROMYCES BOULARDII 250 MG PO CAPS
250.0000 mg | ORAL_CAPSULE | Freq: Two times a day (BID) | ORAL | Status: DC
  Administered 2013-02-11 – 2013-02-15 (×8): 250 mg via ORAL
  Filled 2013-02-11 (×10): qty 1

## 2013-02-11 MED ORDER — GLYCOPYRROLATE 0.2 MG/ML IJ SOLN
INTRAMUSCULAR | Status: DC | PRN
  Administered 2013-02-11: 0.6 mg via INTRAVENOUS

## 2013-02-11 MED ORDER — FLUOXETINE HCL 20 MG PO CAPS
20.0000 mg | ORAL_CAPSULE | Freq: Every day | ORAL | Status: DC
  Administered 2013-02-11 – 2013-02-15 (×5): 20 mg via ORAL
  Filled 2013-02-11 (×5): qty 1

## 2013-02-11 MED ORDER — LACTATED RINGERS IV BOLUS (SEPSIS): 1000.0000 mL | Freq: Three times a day (TID) | INTRAVENOUS | Status: AC | PRN

## 2013-02-11 MED ORDER — CEFAZOLIN SODIUM-DEXTROSE 2-3 GM-% IV SOLR
2.0000 g | Freq: Three times a day (TID) | INTRAVENOUS | Status: AC
  Administered 2013-02-11 (×3): 2 g via INTRAVENOUS
  Filled 2013-02-11 (×3): qty 50

## 2013-02-11 MED ORDER — HEPARIN SODIUM (PORCINE) 5000 UNIT/ML IJ SOLN
5000.0000 [IU] | Freq: Three times a day (TID) | INTRAMUSCULAR | Status: DC
  Administered 2013-02-11 – 2013-02-15 (×13): 5000 [IU] via SUBCUTANEOUS
  Filled 2013-02-11 (×15): qty 1

## 2013-02-11 MED ORDER — METOPROLOL TARTRATE 12.5 MG HALF TABLET
12.5000 mg | ORAL_TABLET | Freq: Two times a day (BID) | ORAL | Status: DC | PRN
  Filled 2013-02-11: qty 1

## 2013-02-11 MED ORDER — ADULT MULTIVITAMIN W/MINERALS CH
1.0000 | ORAL_TABLET | Freq: Every day | ORAL | Status: DC
  Administered 2013-02-11 – 2013-02-15 (×5): 1 via ORAL
  Filled 2013-02-11 (×5): qty 1

## 2013-02-11 MED ORDER — INFLUENZA VAC SPLIT QUAD 0.5 ML IM SUSP
0.5000 mL | Freq: Once | INTRAMUSCULAR | Status: AC
  Administered 2013-02-11: 0.5 mL via INTRAMUSCULAR
  Filled 2013-02-11: qty 0.5

## 2013-02-11 MED ORDER — NEOSTIGMINE METHYLSULFATE 1 MG/ML IJ SOLN
INTRAMUSCULAR | Status: DC | PRN
  Administered 2013-02-11: 4 mg via INTRAVENOUS

## 2013-02-11 MED ORDER — PROMETHAZINE HCL 25 MG/ML IJ SOLN
INTRAMUSCULAR | Status: AC
  Filled 2013-02-11: qty 1

## 2013-02-11 MED ORDER — SODIUM CHLORIDE 0.9 % IJ SOLN
INTRAMUSCULAR | Status: AC
  Filled 2013-02-11: qty 3

## 2013-02-11 MED ORDER — POLYETHYLENE GLYCOL 3350 17 G PO PACK
17.0000 g | PACK | Freq: Two times a day (BID) | ORAL | Status: DC
  Administered 2013-02-11 – 2013-02-15 (×6): 17 g via ORAL
  Filled 2013-02-11 (×10): qty 1

## 2013-02-11 NOTE — Op Note (Signed)
02/10/2013 - 02/11/2013  12:31 AM  PATIENT:  Mary [name]  47 y.o. female  Patient has no care team.  PRE-OPERATIVE DIAGNOSIS:  ventral hernia incarcerated  POST-OPERATIVE DIAGNOSIS:  INCARCERATED INCISIONAL VENTRAL WALL HERNIA  PROCEDURE:  Procedure(s): DIAGNOSTIC LAPAROSCOPY LAPAROSCOPIC LYSIS OF ADHESIONS REDUCTION/REPAIR INCARCERATED INCISIONAL VENTRAL WALL HERNIA WITH MESH   SURGEON:  Surgeon(s): Ardeth Sportsman, MD  ASSISTANT: RN   ANESTHESIA:   local and general  EBL:  Total I/O In: 1000 [I.V.:1000] Out: 435 [Urine:85; Other:350]  Delay start of Pharmacological VTE agent (>24hrs) due to surgical blood loss or risk of bleeding:  no  DRAINS: none   SPECIMEN:  No Specimen  DISPOSITION OF SPECIMEN:  N/A  COUNTS:  YES  PLAN OF CARE: Admit to inpatient   PATIENT DISPOSITION:  PACU - hemodynamically stable.  INDICATION: Patient has developed a ventral wall abdominal hernia. Usually reducible.  However became incarcerated.  Worsening severe pain.  Edema small bowel.  It was concerning that the hernia was becoming strangulated Recommendation was made for surgical repair:   The anatomy & physiology of the abdominal wall was discussed. The pathophysiology of hernias was discussed. Natural history risks without surgery including progeressive enlargement, pain, incarceration & strangulation was discussed. Contributors to complications such as smoking, obesity, diabetes, prior surgery, etc were discussed.  I feel the risks of no intervention will lead to serious problems that outweigh the operative risks; therefore, I recommended surgery to reduce and repair the hernia. I explained laparoscopic techniques with possible need for an open approach. I noted the probable use of mesh to patch and/or buttress the hernia repair  Risks such as bleeding, infection, abscess, need for further treatment, heart attack, death, and other risks were discussed. I noted a good likelihood this  will help address the problem. Goals of post-operative recovery were discussed as well. Possibility that this will not correct all symptoms was explained. I stressed the importance of low-impact activity, aggressive pain control, avoiding constipation, & not pushing through pain to minimize risk of post-operative chronic pain or injury. Possibility of reherniation especially with smoking, obesity, diabetes, immunosuppression, and other health conditions was discussed. We will work to minimize complications.  An educational handout further explaining the pathology & treatment options was given as well. Questions were answered. The patient expresses understanding & wishes to proceed with surgery.   OR FINDINGS: 6 x 3 cm Swiss cheese type hernia.  Largest 3 x 2 cm supraumbilical.  Incarcerated with knuckle of proximal jejunum.  No necrosis/ischemia.  No abscess.   Type of repair - Laparoscopic underlay repair   Name of mesh - Bard Ventralight dual sided (polypropylene / Seprafilm)  Size of mesh - Length 20 cm, Width 15 cm  Mesh overlap - 5-7 cm  Placement of mesh - Intraperitoneal underlay repair   DESCRIPTION:   Informed consent was confirmed. The patient underwent general anaesthesia without difficulty. The patient was positioned appropriately. VTE prevention in place. The patient's abdomen was clipped, prepped, & draped in a sterile fashion. Surgical timeout confirmed our plan.   The patient was positioned in reverse Trendelenburg. Abdominal entry was gained using optical entry technique in the left upper abdomen. Entry was clean. I induced carbon dioxide insufflation. Camera inspection revealed no injury. Extra ports were carefully placed under direct laparoscopic visualization.   I could see moderate adhesions of the transverse colon and greater omentum to the central part of the abdomen.  There were also dense adhesions of liver and greater omentum  in the upper abdomen, especially the left  upper quadrant.  I did laparoscopic lysis of adhesions to expose the entire anterior abdominal wall.  I primarily used focused cold scissors.    I made sure hemostasis was good.  As I came centrally, I was able to reduce a knuckle of small intestine down.  Some inflammation and mild ischemia but no necrosis appeared.  It quickly perked up and looked normal.  I ran the small bowel from the ileocecal junction to the ligament of Treitz.  That was the only area of abnormality.  Mild proximal jejunal adhesion was released by me using cold scissors.  No other abnormalities.  I ran the small bowel again and saw no other problems.  Colon from cecum to mid rectum peritoneal reflection looked normal.  The omentum could now mobilize down more towards the lower abdomen.  I could see the hernias in the central abdomen.  4 defects.  The largest 3 x 2 cm had contained the small bowel.  I reduced the hernia sac down.   mapped out the region using a needle passer.   To ensure that I would have at least 5 cm radial coverage outside of the hernia defect, I chose a 20x15 cm dual sided mesh.  I placed #1 Prolene stitches around its edge about every 5 cm = 14 total.  I rolled the mesh & placed into the peritoneal cavity through the 5cm fascial defect mildly dilated.  I unrolled the mesh and positioned it appropriately.  I secured the mesh to cover up the hernia defect using a laparoscopic suture passer to pass the tails of the Prolene through the abdominal wall & tagged them with clamps.  I started out in four corners to make sure I had the mesh centered over the hernia defect appropriately, and then proceeded to work in quadrants.  We evacuated CO2 & desufflated the abdomen.  I tied the fascial stitches down.  I reinsufflated the abdomen.  The mesh provided at least 5-10 cm circumferential coverage around the entire region of hernia defects.   I tacked the edges & central part of the mesh to the peritoneum/posterior rectus fascia  with  SecureStrap absorbable tacks.   Hemostasis was excellent.   I did reinspection. Hemostasis was good. Mesh laid well. The fascia of the port sites were too small to allow my pinky to pass, so I did not close it more aggressivelyCapnoperitoneum was evacuated. Ports were removed. The skin was closed with Monocryl at the port sites and Steri-Strips on the fascial stitch puncture sites. OnQ catheters were placed and the sheathes peeled away. On-Q pump was secured. Patient is being extubated to go to the recovery room. There is no family discuss postoperative findings.  We will discuss with the patient in a few hours when she is more awake.

## 2013-02-11 NOTE — Care Management Note (Signed)
    Page 1 of 1   02/11/2013     11:41:34 AM   CARE MANAGEMENT NOTE 02/11/2013  Patient:  Mary [name], Mary [name]   Account Number:  [id number]  Date Initiated:  02/11/2013  Documentation initiated by:  Lorenda Ishihara  Subjective/Objective Assessment:   47 yo female admitted s/p laparoscopy, lap lysis of adhesions, repair of ventral hernia. PTA lived at home with spouse, currently in [place] rehab.     Action/Plan:   Return to rehab when stable   Anticipated DC Date:  02/13/2013   Anticipated DC Plan:  HOME/SELF CARE      DC Planning Services  CM consult      Choice offered to / List presented to:             Status of service:  Completed, signed off Medicare Important Message given?   (If response is "NO", the following Medicare IM given date fields will be blank) Date Medicare IM given:   Date Additional Medicare IM given:    Discharge Disposition:  HOME/SELF CARE  Per UR Regulation:  Reviewed for med. necessity/level of care/duration of stay  If discussed at Long Length of Stay Meetings, dates discussed:    Comments:
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
## 2013-02-11 NOTE — Transfer of Care (Signed)
Immediate Anesthesia Transfer of Care Note  Patient: Mary [name]  Procedure(s) Performed: Procedure(s) with comments: DIAGNOSTIC LAPAROSCOPIC  (N/A) - DIAGNOSTIC LAPAROSCOPY,laparoscopic ventral hernia repair with mesh lysis of adhesions  Patient Location: PACU  Anesthesia Type:General  Level of Consciousness: awake, alert  and oriented  Airway & Oxygen Therapy: Patient Spontanous Breathing and Patient connected to face mask oxygen  Post-op Assessment: Report given to PACU RN, Post -op Vital signs reviewed and stable and Patient moving all extremities X 4  Post vital signs: Reviewed and stable  Complications: No apparent anesthesia complications

## 2013-02-11 NOTE — Plan of Care (Signed)
Problem: Phase I Progression Outcomes Goal: OOB as tolerated unless otherwise ordered Outcome: Progressing Patient has walked in the halls 3x thus far today and tolerated well.  Goal: Voiding-avoid urinary catheter unless indicated Outcome: Not Progressing Pt requiring I&O cath due to not voiding.   Problem: Phase II Progression Outcomes Goal: Pain controlled Outcome: Progressing Pain being adequately controlled with IV and PO pain medications.  Goal: Progress activity as tolerated unless otherwise ordered Outcome: Progressing Pt ambulating in halls.  Goal: Progressing with IS, TCDB Outcome: Progressing Pt instructed on use of IS and using every hour while awake.  Goal: Vital signs stable Outcome: Progressing VS stable.

## 2013-02-11 NOTE — Progress Notes (Signed)
UR completed 

## 2013-02-11 NOTE — Progress Notes (Signed)
Clinical Social Work Department BRIEF PSYCHOSOCIAL ASSESSMENT 02/11/2013  Patient:  Mary [name], Mary [name]     Account Number:  [id number]     Admit date:  02/10/2013  Clinical Social Worker:  Candie Chroman  Date/Time:  02/11/2013 01:47 PM  Referred by:  RN  Date Referred:  02/11/2013 Referred for  Other - See comment   Other Referral:   Pt is from [facility] residential treatment facility.   Interview type:  Patient Other interview type:    PSYCHOSOCIAL DATA Living Status:  FAMILY Admitted from facility:   Level of care:   Primary support name:  Mary [name] Primary support relationship to patient:  CHILD, ADULT Degree of support available:   supportive    CURRENT CONCERNS Current Concerns  Substance Abuse   Other Concerns:    SOCIAL WORK ASSESSMENT / PLAN Pt is a 47 yr old female admitted from [facility] where she was receiving substance abuse treatment. CSW met with pt to assist with d/c planning. Pt states she had been at [facility] for several weeks but was discharged from treatment when she was hospitalized. Pt states her plans are to return home following hospital d/c. " I will recover at home and then I would like to return to [facility] to continue treatment". Pt states she has good family support and a sponsor to help her stay sober.   Assessment/plan status:  No Further Intervention Required Other assessment/ plan:   Information/referral to community resources:   None needed at this time.    PATIENT'S/FAMILY'S RESPONSE TO PLAN OF CARE: " Mary [name] has a wonderful program. I need to get over this illness before I return ".

## 2013-02-12 MED ORDER — KETOROLAC TROMETHAMINE 30 MG/ML IJ SOLN
30.0000 mg | Freq: Four times a day (QID) | INTRAMUSCULAR | Status: AC
  Administered 2013-02-12 – 2013-02-13 (×8): 30 mg via INTRAVENOUS
  Filled 2013-02-12 (×10): qty 1

## 2013-02-12 MED ORDER — ONDANSETRON HCL 4 MG/2ML IJ SOLN
4.0000 mg | INTRAMUSCULAR | Status: DC | PRN
  Administered 2013-02-12 – 2013-02-13 (×2): 4 mg via INTRAVENOUS
  Filled 2013-02-12 (×3): qty 2

## 2013-02-12 MED ORDER — KCL IN DEXTROSE-NACL 20-5-0.9 MEQ/L-%-% IV SOLN
INTRAVENOUS | Status: DC
  Administered 2013-02-12 – 2013-02-14 (×5): via INTRAVENOUS
  Filled 2013-02-12 (×6): qty 1000

## 2013-02-12 NOTE — Progress Notes (Signed)
2 Days Post-Op  Subjective: Very sore.  Some nausea.  One episode of vomiting.  Objective: Vital signs in last 24 hours: Temp:  [97.9 F (36.6 C)-98.3 F (36.8 C)] 98.3 F (36.8 C) (12/03 0536) Pulse Rate:  [69-85] 85 (12/03 0536) Resp:  [17-18] 18 (12/03 0536) BP: (98-127)/(57-86) 98/57 mmHg (12/03 0536) SpO2:  [91 %-96 %] 92 % (12/03 0536) Last BM Date: 02/09/13  Intake/Output from previous day: 12/02 0701 - 12/03 0700 In: 2630 [P.O.:480; I.V.:1800; IV Piggyback:350] Out: 851 [Urine:850; Emesis/NG output:1] Intake/Output this shift:    PE: General- In NAD Abdomen-slightly firm and distended, dressings dry, few bowel sounds.  Lab Results:   Recent Labs  02/10/13 1300 02/11/13 0640  WBC 12.7* 16.2*  HGB 13.8 12.7  HCT 40.1 36.5  PLT 310 297   BMET  Recent Labs  02/10/13 1300 02/11/13 0640  NA 137 133*  K 4.2 4.2  CL 104 103  CO2 23 24  GLUCOSE 112* 133*  BUN 13 9  CREATININE 0.70 0.58  CALCIUM 11.0* 10.3   PT/INR  Recent Labs  02/10/13 1300  LABPROT 14.3  INR 1.13   Comprehensive Metabolic Panel:    Component Value Date/Time   NA 133* 02/11/2013 0640   K 4.2 02/11/2013 0640   CL 103 02/11/2013 0640   CO2 24 02/11/2013 0640   BUN 9 02/11/2013 0640   CREATININE 0.58 02/11/2013 0640   GLUCOSE 133* 02/11/2013 0640   CALCIUM 10.3 02/11/2013 0640   AST 22 02/10/2013 1353   ALT 12 02/10/2013 1353   ALKPHOS 98 02/10/2013 1353   BILITOT 0.3 02/10/2013 1353   PROT 7.8 02/10/2013 1353   ALBUMIN 3.9 02/10/2013 1353     Studies/Results: Ct Abdomen Pelvis W Contrast  02/10/2013   CLINICAL DATA:  Mid abdominal pain, concern for incarcerated hernia  EXAM: CT ABDOMEN AND PELVIS WITH CONTRAST  TECHNIQUE: Multidetector CT imaging of the abdomen and pelvis was performed using the standard protocol following bolus administration of intravenous contrast.  CONTRAST:  50mL OMNIPAQUE IOHEXOL 300 MG/ML SOLN, OMNIPAQUE IOHEXOL 300 MG/ML SOLN  COMPARISON:  None.   FINDINGS: There is mild diffuse fatty infiltration of the liver with no focal hepatic abnormalities. The gallbladder is normal. 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 There is minimal dilatation of the pancreatic duct is diffusely with no evidence of peripancreatic inflammation or pancreatic mass. Adrenal glands are normal. Kidneys are normal, except for a few small right renal nonobstructing calculi. There are approximately 3 stones on the right with the largest measuring 5 mm, seen in the midpole.  The aorta is not dilated. There is no ascites. The bladder is normal.  In the left adnexa, there is a peripherally enhancing 2 cm cystic lesion. There is trace free fluid in the cul-de-sac. The uterus is mildly prominent at 6.5 x 5.7 by 11.4 cm. No focal uterine masses are identified, but diffuse leiomyomatous involvement could have this appearance.  There are no abnormally dilated loops of bowel. There is a periumbilical hernia containing a loop of small bowel. This loop of small bowel shows narrowing at the hernia and neck, with mild inflammatory change with scan the hernia sac.  The visualized portions of the lung bases are clear except for mild dependent atelectasis bilaterally. There is severe degenerative facet change at L5-S1 with grade 2 anterior listhesis of L5 on the sacrum, of about 11 mm.  IMPRESSION: 1. Short segment of small bowel involved in a periumbilical abdominal wall hernia.  Mild inflammatory change and wall thickening suggest the possibility of strangulation. 2. 1 cm of anterior listhesis of L5 on the sacrum due to severe degenerative facet change. 3. 2 cm peripherally enhancing cystic lesion low in the left adnexa. This likely represents a collapsed hemorrhagic left ovarian cyst associated with a physiologic volume of free fluid in the cul-de-sac. 4. Mild pancreatic ductal prominence of uncertain etiology. It may be reasonable to correlate with appropriate laboratory values.   Electronically Signed   By:  Esperanza Heir M.D.   On: 02/10/2013 19:17   Dg Abd Acute W/chest  02/10/2013   CLINICAL DATA:  Abdominal pain and nausea and vomiting. Some lower chest pain and shortness of breath.  EXAM: ACUTE ABDOMEN SERIES (ABDOMEN 2 VIEW & CHEST 1 VIEW)  COMPARISON:  Chest x-ray dated 06/14/2012  FINDINGS: Heart and lungs appear essentially normal. No free air or free fluid in the abdomen. Bowel gas pattern is normal. Small calcification in the mid right kidney. Facet arthritis at L5-S1 bilaterally.  IMPRESSION: No acute abnormalities.  Small stone in the right kidney.   Electronically Signed   By: Geanie Cooley M.D.   On: 02/10/2013 17:43    Anti-infectives: Anti-infectives   Start     Dose/Rate Route Frequency Ordered Stop   02/11/13 0600  ceFAZolin (ANCEF) IVPB 2 g/50 mL premix     2 g 100 mL/hr over 30 Minutes Intravenous 60 min pre-op 02/10/13 2018 02/10/13 2241   02/11/13 0600  [MAR Hold]  metroNIDAZOLE (FLAGYL) IVPB 500 mg     (On MAR Hold since 02/10/13 2230)  Comments:  Pharmacy may adjust dosing strength, interval, or rate of medication as needed for optimal therapy for the patient Send with patient on call to the OR.  Anesthesia to complete antibiotic administration <51min prior to incision per [facility].   500 mg 100 mL/hr over 60 Minutes Intravenous On call to O.R. 02/10/13 2021 02/10/13 2322   02/11/13 0600  metroNIDAZOLE (FLAGYL) IVPB 500 mg     500 mg 100 mL/hr over 60 Minutes Intravenous Every 6 hours 02/11/13 0124 02/11/13 1854   02/11/13 0600  ceFAZolin (ANCEF) IVPB 2 g/50 mL premix     2 g 100 mL/hr over 30 Minutes Intravenous 3 times per day 02/11/13 0124 02/11/13 2343      Assessment Principal Problem:   Incarcerated incisional hernia s/p lap repair 02/10/2013-some ileus; fair pain control Active Problems:   Polysubstance dependence   Hepatitis C    LOS: 2 days   Plan: Sips of clear liquids.  Add Toradol. Mobilize.   Darious Rehman J 02/12/2013

## 2013-02-12 NOTE — Progress Notes (Signed)
Patient states she is feeling nauseated.  Patient says she " threw up."  Only scant amount of clear, thin mucous seen.  Patient refusing to take zofran for nausea. Patient states " I don't want any medicine. I am fine."  Will continue to monitor patient.

## 2013-02-12 NOTE — ED Provider Notes (Signed)
Medical screening examination/treatment/procedure(s) were performed by non-physician practitioner and as supervising physician I was immediately available for consultation/collaboration.  EKG Interpretation    Date/Time:    Ventricular Rate:    PR Interval:    QRS Duration:   QT Interval:    QTC Calculation:   R Axis:     Text Interpretation:                Candyce Churn, MD 02/12/13 1249

## 2013-02-12 NOTE — Progress Notes (Signed)
Patient changed to inpatient status r/t continuing to require IVF @ 100cc/hr and IV pain medication

## 2013-02-13 ENCOUNTER — Encounter (HOSPITAL_COMMUNITY): Payer: Self-pay | Admitting: General Surgery

## 2013-02-13 DIAGNOSIS — K567 Ileus, unspecified: Secondary | ICD-10-CM

## 2013-02-13 MED ORDER — MORPHINE SULFATE 2 MG/ML IJ SOLN
1.0000 mg | INTRAMUSCULAR | Status: DC | PRN
  Administered 2013-02-13 – 2013-02-14 (×2): 2 mg via INTRAVENOUS
  Administered 2013-02-14 (×2): 4 mg via INTRAVENOUS
  Filled 2013-02-13 (×2): qty 1
  Filled 2013-02-13 (×2): qty 2

## 2013-02-13 NOTE — Anesthesia Postprocedure Evaluation (Signed)
  Anesthesia Post-op Note  Patient: Mary [name]  Procedure(s) Performed: Procedure(s) (LRB): DIAGNOSTIC LAPAROSCOPIC  (N/A)  Patient Location: PACU  Anesthesia Type: General  Level of Consciousness: awake and alert   Airway and Oxygen Therapy: Patient Spontanous Breathing  Post-op Pain: mild  Post-op Assessment: Post-op Vital signs reviewed, Patient's Cardiovascular Status Stable, Respiratory Function Stable, Patent Airway and No signs of Nausea or vomiting  Last Vitals:  Filed Vitals:   02/13/13 0543  BP: 115/77  Pulse: 73  Temp: 36.7 C  Resp: 18    Post-op Vital Signs: stable   Complications: No apparent anesthesia complications

## 2013-02-13 NOTE — Plan of Care (Signed)
Problem: Phase III Progression Outcomes Goal: IV changed to normal saline lock Outcome: Not Met (add Reason) Still on iv fluid and iv meds

## 2013-02-13 NOTE — Progress Notes (Signed)
Patient ID: Mary [name], female   DOB: [birth year], 47 y.o.   MRN: [id number] 3 Days Post-Op  Subjective: Pt feels ok.  Mobilizing.  Some nausea this am and unable to tolerate her clear liquids for breakfast.  Passed some flatus, but struggling to pass more.  Objective: Vital signs in last 24 hours: Temp:  [98 F (36.7 C)-98.7 F (37.1 C)] 98 F (36.7 C) (12/04 0543) Pulse Rate:  [64-73] 73 (12/04 0543) Resp:  [18] 18 (12/04 0543) BP: (112-115)/(74-77) 115/77 mmHg (12/04 0543) SpO2:  [92 %-96 %] 92 % (12/04 0543) Last BM Date: 02/09/13 (pt reports having a bm 4 days ago)  Intake/Output from previous day: 12/03 0701 - 12/04 0700 In: 1756.7 [P.O.:960; I.V.:796.7] Out: 1451 [Urine:1450; Emesis/NG output:1] Intake/Output this shift: Total I/O In: 240 [P.O.:240] Out: 400 [Urine:400]  PE: Abd: soft, still bloated, but has some BS, appropriately tender.  Incisions c/d/i  Lab Results:   Recent Labs  02/10/13 1300 02/11/13 0640  WBC 12.7* 16.2*  HGB 13.8 12.7  HCT 40.1 36.5  PLT 310 297   BMET  Recent Labs  02/10/13 1300 02/11/13 0640  NA 137 133*  K 4.2 4.2  CL 104 103  CO2 23 24  GLUCOSE 112* 133*  BUN 13 9  CREATININE 0.70 0.58  CALCIUM 11.0* 10.3   PT/INR  Recent Labs  02/10/13 1300  LABPROT 14.3  INR 1.13   CMP     Component Value Date/Time   NA 133* 02/11/2013 0640   K 4.2 02/11/2013 0640   CL 103 02/11/2013 0640   CO2 24 02/11/2013 0640   GLUCOSE 133* 02/11/2013 0640   BUN 9 02/11/2013 0640   CREATININE 0.58 02/11/2013 0640   CALCIUM 10.3 02/11/2013 0640   PROT 7.8 02/10/2013 1353   ALBUMIN 3.9 02/10/2013 1353   AST 22 02/10/2013 1353   ALT 12 02/10/2013 1353   ALKPHOS 98 02/10/2013 1353   BILITOT 0.3 02/10/2013 1353   GFRNONAA >90 02/11/2013 0640   GFRAA >90 02/11/2013 0640   Lipase  No results found for this basename: lipase       Studies/Results: No results found.  Anti-infectives: Anti-infectives   Start     Dose/Rate Route Frequency  Ordered Stop   02/11/13 0600  ceFAZolin (ANCEF) IVPB 2 g/50 mL premix     2 g 100 mL/hr over 30 Minutes Intravenous 60 min pre-op 02/10/13 2018 02/10/13 2241   02/11/13 0600  [MAR Hold]  metroNIDAZOLE (FLAGYL) IVPB 500 mg     (On MAR Hold since 02/10/13 2230)  Comments:  Pharmacy may adjust dosing strength, interval, or rate of medication as needed for optimal therapy for the patient Send with patient on call to the OR.  Anesthesia to complete antibiotic administration <60min prior to incision per [facility].   500 mg 100 mL/hr over 60 Minutes Intravenous On call to O.R. 02/10/13 2021 02/10/13 2322   02/11/13 0600  metroNIDAZOLE (FLAGYL) IVPB 500 mg     500 mg 100 mL/hr over 60 Minutes Intravenous Every 6 hours 02/11/13 0124 02/11/13 1854   02/11/13 0600  ceFAZolin (ANCEF) IVPB 2 g/50 mL premix     2 g 100 mL/hr over 30 Minutes Intravenous 3 times per day 02/11/13 0124 02/11/13 2343       Assessment/Plan  1.  POD 3, from laparoscopic ventral hernia repair with mesh 2. Mild post op ileus  Plan: 1. Will continue clear liquids today.  Await better bowel function before advancing her  diet. 2. Cont to mobilize 3. Switch pain meds to morphine from dilaudid to see if this will help her HA.  LOS: 3 days    Zyshonne Malecha E 02/13/2013, 12:17 PM Pager: [phone]
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
## 2013-02-14 LAB — BASIC METABOLIC PANEL
CO2: 25 mEq/L (ref 19–32)
Chloride: 105 mEq/L (ref 96–112)
GFR calc Af Amer: >90 mL/min (ref >90)
Potassium: 3.9 mEq/L (ref 3.5–5.1)
Sodium: 136 mEq/L (ref 135–145)

## 2013-02-14 LAB — CBC
MCH: 31.2 pg (ref 26.0–34.0)
Platelets: 263 10*3/uL (ref 150–400)
RBC: 3.53 MIL/uL — ABNORMAL LOW (ref 3.87–5.11)
RDW: 13 % (ref 11.5–15.5)
WBC: 11.5 10*3/uL — ABNORMAL HIGH (ref 4.0–10.5)

## 2013-02-14 MED ORDER — OXYCODONE HCL 5 MG PO TABS: 5.0000 mg | ORAL_TABLET | ORAL | Status: DC | PRN

## 2013-02-14 MED ORDER — ACETAMINOPHEN 325 MG PO TABS: 650.0000 mg | ORAL_TABLET | Freq: Four times a day (QID) | ORAL | Status: DC | PRN

## 2013-02-14 MED ORDER — BISACODYL 10 MG RE SUPP
10.0000 mg | Freq: Once | RECTAL | Status: AC
  Administered 2013-02-14: 10 mg via RECTAL
  Filled 2013-02-14: qty 1

## 2013-02-14 MED ORDER — POLYETHYLENE GLYCOL 3350 17 G PO PACK: 17.0000 g | PACK | Freq: Every day | ORAL | Status: DC | PRN

## 2013-02-14 NOTE — Progress Notes (Signed)
Pt say she cannot go till tomorrow.  Plan d/c to [facility] rehab tomorrow.  D/c summary is done, and Rx in in wall unit.

## 2013-02-14 NOTE — Progress Notes (Signed)
4 Days Post-Op  Subjective: No further nausea.  Passing gas.  Tolerating liquids.  No BM.  Would like to try solid food.  Objective: Vital signs in last 24 hours: Temp:  [98.3 F (36.8 C)] 98.3 F (36.8 C) (12/05 0602) Pulse Rate:  [65-70] 69 (12/05 0602) Resp:  [18] 18 (12/05 0602) BP: (109-113)/(65-75) 109/72 mmHg (12/05 0602) SpO2:  [92 %-95 %] 92 % (12/05 0602) Last BM Date: 02/12/13  Intake/Output from previous day: 12/04 0701 - 12/05 0700 In: 4320 [P.O.:720; I.V.:3600] Out: 1550 [Urine:1550] Intake/Output this shift:    PE: General- In NAD Abdomen-soft, incisions are clean and intact.  Lab Results:  No results found for this basename: WBC, HGB, HCT, PLT,  in the last 72 hours BMET No results found for this basename: NA, K, CL, CO2, GLUCOSE, BUN, CREATININE, CALCIUM,  in the last 72 hours PT/INR No results found for this basename: LABPROT, INR,  in the last 72 hours Comprehensive Metabolic Panel:    Component Value Date/Time   NA 133* 02/11/2013 0640   K 4.2 02/11/2013 0640   CL 103 02/11/2013 0640   CO2 24 02/11/2013 0640   BUN 9 02/11/2013 0640   CREATININE 0.58 02/11/2013 0640   GLUCOSE 133* 02/11/2013 0640   CALCIUM 10.3 02/11/2013 0640   AST 22 02/10/2013 1353   ALT 12 02/10/2013 1353   ALKPHOS 98 02/10/2013 1353   BILITOT 0.3 02/10/2013 1353   PROT 7.8 02/10/2013 1353   ALBUMIN 3.9 02/10/2013 1353     Studies/Results: No results found.  Anti-infectives: Anti-infectives   Start     Dose/Rate Route Frequency Ordered Stop   02/11/13 0600  ceFAZolin (ANCEF) IVPB 2 g/50 mL premix     2 g 100 mL/hr over 30 Minutes Intravenous 60 min pre-op 02/10/13 2018 02/10/13 2241   02/11/13 0600  [MAR Hold]  metroNIDAZOLE (FLAGYL) IVPB 500 mg     (On MAR Hold since 02/10/13 2230)  Comments:  Pharmacy may adjust dosing strength, interval, or rate of medication as needed for optimal therapy for the patient Send with patient on call to the OR.  Anesthesia to complete antibiotic  administration <64min prior to incision per [facility].   500 mg 100 mL/hr over 60 Minutes Intravenous On call to O.R. 02/10/13 2021 02/10/13 2322   02/11/13 0600  metroNIDAZOLE (FLAGYL) IVPB 500 mg     500 mg 100 mL/hr over 60 Minutes Intravenous Every 6 hours 02/11/13 0124 02/11/13 1854   02/11/13 0600  ceFAZolin (ANCEF) IVPB 2 g/50 mL premix     2 g 100 mL/hr over 30 Minutes Intravenous 3 times per day 02/11/13 0124 02/11/13 2343      Assessment Principal Problem:   Incarcerated incisional hernia s/p lap repair 02/10/2013 Active Problems:   Polysubstance dependence   Hepatitis C   Ileus, postoperative-resolving.    LOS: 4 days   Plan: Advance diet.  Heplock IV. Shower.  If she does well today, would be ready for discharge tomorrow.   Khloi Rawl J 02/14/2013

## 2013-02-15 NOTE — Progress Notes (Signed)
General Surgery Note  LOS: 5 days  POD -  5 Days Post-Op  Assessment/Plan: 1.  DIAGNOSTIC LAPAROSCOPIC, LAPAROSCOPIC LYSIS OF ADHESIONS, REDUCTION/REPAIR INCARCERATED INCISIONAL VENTRAL WALL HERNIA WITH MESH  - 02/10/2013 - S. Gross  Patient is going to [facility], but going home first  She has to be off pain meds to return to [facility], so she is going home with her daughter, Clarise Cruz.  She has a prescription for oxycodone (though she thinks it is not enough).  Will has done D/C summary.  2.  DVT prophylaxis - SQ heparin 3.  History of polysubstance abuse 4.  History of Hep C 5.  History of depression 6.  Hiatal hernia   Principal Problem:   Incarcerated incisional hernia s/p lap repair 02/10/2013 Active Problems:   Polysubstance dependence   Hepatitis C   Ileus, postoperative   Subjective:  Doing well.  No nausea.  Taking po's.  Has had BM's. Objective:   Filed Vitals:   02/15/13 0522  BP: 93/54  Pulse: 75  Temp: 98.3 F (36.8 C)  Resp: 18     Intake/Output from previous day:  12/05 0701 - 12/06 0700 In: 1520 [P.O.:720; I.V.:800] Out: 1151 [Urine:1150; Stool:1]  Intake/Output this shift:      Physical Exam:   General: WN AA F who is alert and oriented.    HEENT: Normal. Pupils equal. .   Lungs: Clear.   Abdomen: Soft. BS present.   Wound: Look good.   Neurologic:  Grossly intact to motor and sensory function.   Psychiatric: Has normal mood and affect.   Lab Results:    Recent Labs  02/14/13 0826  WBC 11.5*  HGB 11.0*  HCT 32.4*  PLT 263    BMET   Recent Labs  02/14/13 0826  NA 136  K 3.9  CL 105  CO2 25  GLUCOSE 114*  BUN 3*  CREATININE 0.61  CALCIUM 9.6    PT/INR  No results found for this basename: LABPROT, INR,  in the last 72 hours  ABG  No results found for this basename: PHART, PCO2, PO2, HCO3,  in the last 72 hours   Studies/Results:  No results found.   Anti-infectives:   Anti-infectives   Start     Dose/Rate Route  Frequency Ordered Stop   02/11/13 0600  ceFAZolin (ANCEF) IVPB 2 g/50 mL premix     2 g 100 mL/hr over 30 Minutes Intravenous 60 min pre-op 02/10/13 2018 02/10/13 2241   02/11/13 0600  [MAR Hold]  metroNIDAZOLE (FLAGYL) IVPB 500 mg     (On MAR Hold since 02/10/13 2230)  Comments:  Pharmacy may adjust dosing strength, interval, or rate of medication as needed for optimal therapy for the patient Send with patient on call to the OR.  Anesthesia to complete antibiotic administration <16min prior to incision per [facility].   500 mg 100 mL/hr over 60 Minutes Intravenous On call to O.R. 02/10/13 2021 02/10/13 2322   02/11/13 0600  metroNIDAZOLE (FLAGYL) IVPB 500 mg     500 mg 100 mL/hr over 60 Minutes Intravenous Every 6 hours 02/11/13 0124 02/11/13 1854   02/11/13 0600  ceFAZolin (ANCEF) IVPB 2 g/50 mL premix     2 g 100 mL/hr over 30 Minutes Intravenous 3 times per day 02/11/13 0124 02/11/13 2343      Ovidio Kin, MD, FACS Pager: [phone] [facility] Office: [phone] 02/15/2013

## 2013-03-24 ENCOUNTER — Emergency Department (HOSPITAL_BASED_OUTPATIENT_CLINIC_OR_DEPARTMENT_OTHER)
Admission: EM | Admit: 2013-03-24 | Discharge: 2013-03-24 | Disposition: A | Discharge status: Rehabilitation Facility | Payer: Self-pay | Attending: Emergency Medicine | Admitting: Emergency Medicine

## 2013-03-24 ENCOUNTER — Encounter (HOSPITAL_BASED_OUTPATIENT_CLINIC_OR_DEPARTMENT_OTHER): Payer: Self-pay | Admitting: Emergency Medicine

## 2013-03-24 ENCOUNTER — Emergency Department (HOSPITAL_BASED_OUTPATIENT_CLINIC_OR_DEPARTMENT_OTHER): Payer: Self-pay

## 2013-03-24 DIAGNOSIS — F1920 Other psychoactive substance dependence, uncomplicated: Secondary | ICD-10-CM | POA: Insufficient documentation

## 2013-03-24 DIAGNOSIS — Z8619 Personal history of other infectious and parasitic diseases: Secondary | ICD-10-CM | POA: Insufficient documentation

## 2013-03-24 DIAGNOSIS — F17200 Nicotine dependence, unspecified, uncomplicated: Secondary | ICD-10-CM | POA: Insufficient documentation

## 2013-03-24 DIAGNOSIS — F329 Major depressive disorder, single episode, unspecified: Secondary | ICD-10-CM | POA: Insufficient documentation

## 2013-03-24 DIAGNOSIS — F3289 Other specified depressive episodes: Secondary | ICD-10-CM | POA: Insufficient documentation

## 2013-03-24 DIAGNOSIS — R1031 Right lower quadrant pain: Secondary | ICD-10-CM | POA: Insufficient documentation

## 2013-03-24 DIAGNOSIS — G8918 Other acute postprocedural pain: Secondary | ICD-10-CM | POA: Insufficient documentation

## 2013-03-24 DIAGNOSIS — Z79899 Other long term (current) drug therapy: Secondary | ICD-10-CM | POA: Insufficient documentation

## 2013-03-24 DIAGNOSIS — L7682 Other postprocedural complications of skin and subcutaneous tissue: Secondary | ICD-10-CM

## 2013-03-24 DIAGNOSIS — Z9089 Acquired absence of other organs: Secondary | ICD-10-CM | POA: Insufficient documentation

## 2013-03-24 DIAGNOSIS — Z9851 Tubal ligation status: Secondary | ICD-10-CM | POA: Insufficient documentation

## 2013-03-24 DIAGNOSIS — Z8719 Personal history of other diseases of the digestive system: Secondary | ICD-10-CM | POA: Insufficient documentation

## 2013-03-24 MED ORDER — IBUPROFEN 800 MG PO TABS: 800.0000 mg | ORAL_TABLET | Freq: Three times a day (TID) | ORAL | Status: DC

## 2013-03-24 NOTE — Discharge Instructions (Signed)
Abdominal Pain, Adult °Many things can cause abdominal pain. Usually, abdominal pain is not caused by a disease and will improve without treatment. It can often be observed and treated at home. Your health care provider will do a physical exam and possibly order blood tests and X-rays to help determine the seriousness of your pain. However, in many cases, more time must pass before a clear cause of the pain can be found. Before that point, your health care provider may not know if you need more testing or further treatment. °HOME CARE INSTRUCTIONS  °Monitor your abdominal pain for any changes. The following actions may help to alleviate any discomfort you are experiencing: °· Only take over-the-counter or prescription medicines as directed by your health care provider. °· Do not take laxatives unless directed to do so by your health care provider. °· Try a clear liquid diet (broth, tea, or water) as directed by your health care provider. Slowly move to a bland diet as tolerated. °SEEK MEDICAL CARE IF: °· You have unexplained abdominal pain. °· You have abdominal pain associated with nausea or diarrhea. °· You have pain when you urinate or have a bowel movement. °· You experience abdominal pain that wakes you in the night. °· You have abdominal pain that is worsened or improved by eating food. °· You have abdominal pain that is worsened with eating fatty foods. °SEEK IMMEDIATE MEDICAL CARE IF:  °· Your pain does not go away within 2 hours. °· You have a fever. °· You keep throwing up (vomiting). °· Your pain is felt only in portions of the abdomen, such as the right side or the left lower portion of the abdomen. °· You pass bloody or black tarry stools. °MAKE SURE YOU: °· Understand these instructions.   °· Will watch your condition.   °· Will get help right away if you are not doing well or get worse.   °Document Released: 12/07/2004 Document Revised: 12/18/2012 Document Reviewed: 11/06/2012 °Exit[employer]® Patient  Information ©2014 [employer]. ° °

## 2013-03-24 NOTE — ED Notes (Signed)
Pt amb to room 3 with quick steady gait in nad pt reports having her hernia "fixed" a month ago, and site is burning when she moves. Denies any injury, strain or trauma.

## 2013-03-24 NOTE — ED Provider Notes (Signed)
History/physical exam/procedure(s) were performed by non-physician practitioner and as supervising physician I was immediately available for consultation/collaboration. I have reviewed all notes and am in agreement with care and plan.   Diandra Cimini S Jas Betten, MD 03/24/13 1536 

## 2013-03-24 NOTE — ED Provider Notes (Signed)
CSN: [id number]     Arrival date & time 03/24/13  1049 History   First MD Initiated Contact with Patient 03/24/13 1110     Chief Complaint  Patient presents with  . right side "burning"    (Consider location/radiation/quality/duration/timing/severity/associated sxs/prior Treatment) Patient is a 48 y.o. female presenting with abdominal pain. The history is provided by the patient. No language interpreter was used.  Abdominal Pain Pain location:  RLQ Pain quality: burning   Pain radiates to:  Does not radiate Pain severity:  Mild Timing:  Constant Progression:  Worsening Chronicity:  New Relieved by:  Nothing Worsened by:  Nothing tried Pt reports she had a hernia repair by Dr. Michaell CowingGross.   Pt did not follow up after surgery.   Pt complains of stinging sensation and discomfort at lower incison site  Past Medical History  Diagnosis Date  . Hernia   . Hepatitis C   . Drug abuse and dependence   . Depression   . H/O hiatal hernia   . Ileus, postoperative 02/13/2013   Past Surgical History  Procedure Laterality Date  . Splenectomy    . Tubal ligation    . Diagnostic laparoscopic liver biopsy N/A 02/10/2013    Procedure: DIAGNOSTIC LAPAROSCOPIC ;  Surgeon: Ardeth SportsmanSteven C. Gross, MD;  Location: [facility];  Service: General;  Laterality: N/A;  DIAGNOSTIC LAPAROSCOPY,laparoscopic ventral hernia repair with mesh lysis of adhesions   History reviewed. No pertinent family history. History  Substance Use Topics  . Smoking status: Current Every Day Smoker -- 1.00 packs/day for 20 years    Types: Cigarettes  . Smokeless tobacco: Not on file  . Alcohol Use: Yes     Comment: 1 pt daily   OB History   Grav Para Term Preterm Abortions TAB SAB Ect Mult Living                 Review of Systems  Gastrointestinal: Positive for abdominal pain.  All other systems reviewed and are negative.    Allergies  Review of patient's allergies indicates no known allergies.  Home Medications   Current  Outpatient Rx  Name  Route  Sig  Dispense  Refill  . acetaminophen (TYLENOL) 325 MG tablet   Oral   Take 2 tablets (650 mg total) by mouth every 6 (six) hours as needed.         Marland Kitchen. FLUoxetine (PROZAC) 20 MG capsule   Oral   Take 20 mg by mouth daily.         . Multiple Vitamins-Minerals (MULTIVITAMIN WITH MINERALS) tablet   Oral   Take 1 tablet by mouth daily.         Marland Kitchen. oxyCODONE (OXY IR/ROXICODONE) 5 MG immediate release tablet   Oral   Take 1-2 tablets (5-10 mg total) by mouth every 4 (four) hours as needed for moderate pain, severe pain or breakthrough pain.   30 tablet   0   . polyethylene glycol (MIRALAX / GLYCOLAX) packet   Oral   Take 17 g by mouth daily as needed (for constipation.).   14 each   0   . QUEtiapine (SEROQUEL) 100 MG tablet   Oral   Take 100 mg by mouth at bedtime.          BP 119/73  Pulse 57  Temp(Src) 97.7 F (36.5 C) (Oral)  Resp 16  Ht 5' 4.5" (1.638 m)  Wt 150 lb (68.04 kg)  BMI 25.36 kg/m2  SpO2 100%  LMP 03/21/2013 Physical  Exam  Nursing note and vitals reviewed. Constitutional: She is oriented to person, place, and time. She appears well-developed and well-nourished.  HENT:  Head: Normocephalic and atraumatic.  Eyes: Pupils are equal, round, and reactive to light.  Neck: Normal range of motion.  Cardiovascular: Normal rate.   Pulmonary/Chest: Effort normal.  Abdominal: Soft.  Musculoskeletal: Normal range of motion.  Neurological: She is alert and oriented to person, place, and time. She has normal reflexes.  Skin: Skin is warm.  Psychiatric: She has a normal mood and affect.    ED Course  Procedures (including critical care time) Labs Review Labs Reviewed - No data to display Imaging Review Dg Abd 1 View  03/24/2013   CLINICAL DATA:  Hernia repair 1 month ago. Complaining of pain at this location.  EXAM: ABDOMEN - 1 VIEW  COMPARISON:  02/10/2013 CT.  FINDINGS: Limited evaluation of the periumbilical region where the  patient previously was noted to have hernia. At this level, gas containing small bowel loops are noted. I cannot determine if these enter recurrent hernia on this single projection.  The possibility of free intraperitoneal air cannot be assessed on a supine view.  Right renal calculi.  Degenerative changes L5-S1.  IMPRESSION: Limited evaluation of the periumbilical region where the patient previously was noted to have hernia. At this level, gas containing small bowel loops are noted. I cannot determine if these enter recurrent hernia on this single projection.   Electronically Signed   By: Bridgett Larsson M.D.   On: 03/24/2013 11:48    EKG Interpretation   None       MDM  No diagnosis found. Pt abdomen is soft I do not palpate a hernia.   KUb  No constipation.    I advised pt Ibuprofen for soreness Schedule follow up with Dr. Celedonio Savage, PA-C 03/24/13 1239

## 2013-04-10 IMAGING — CR DG CHEST 2V
2 series · 2 of 2 positions shown · non-contrast
Comparison: Chest x-ray of 06/07/2011

CLINICAL DATA: Chest pain, short of breath, smoking history

CHEST - 2 VIEW

[w chest pa]
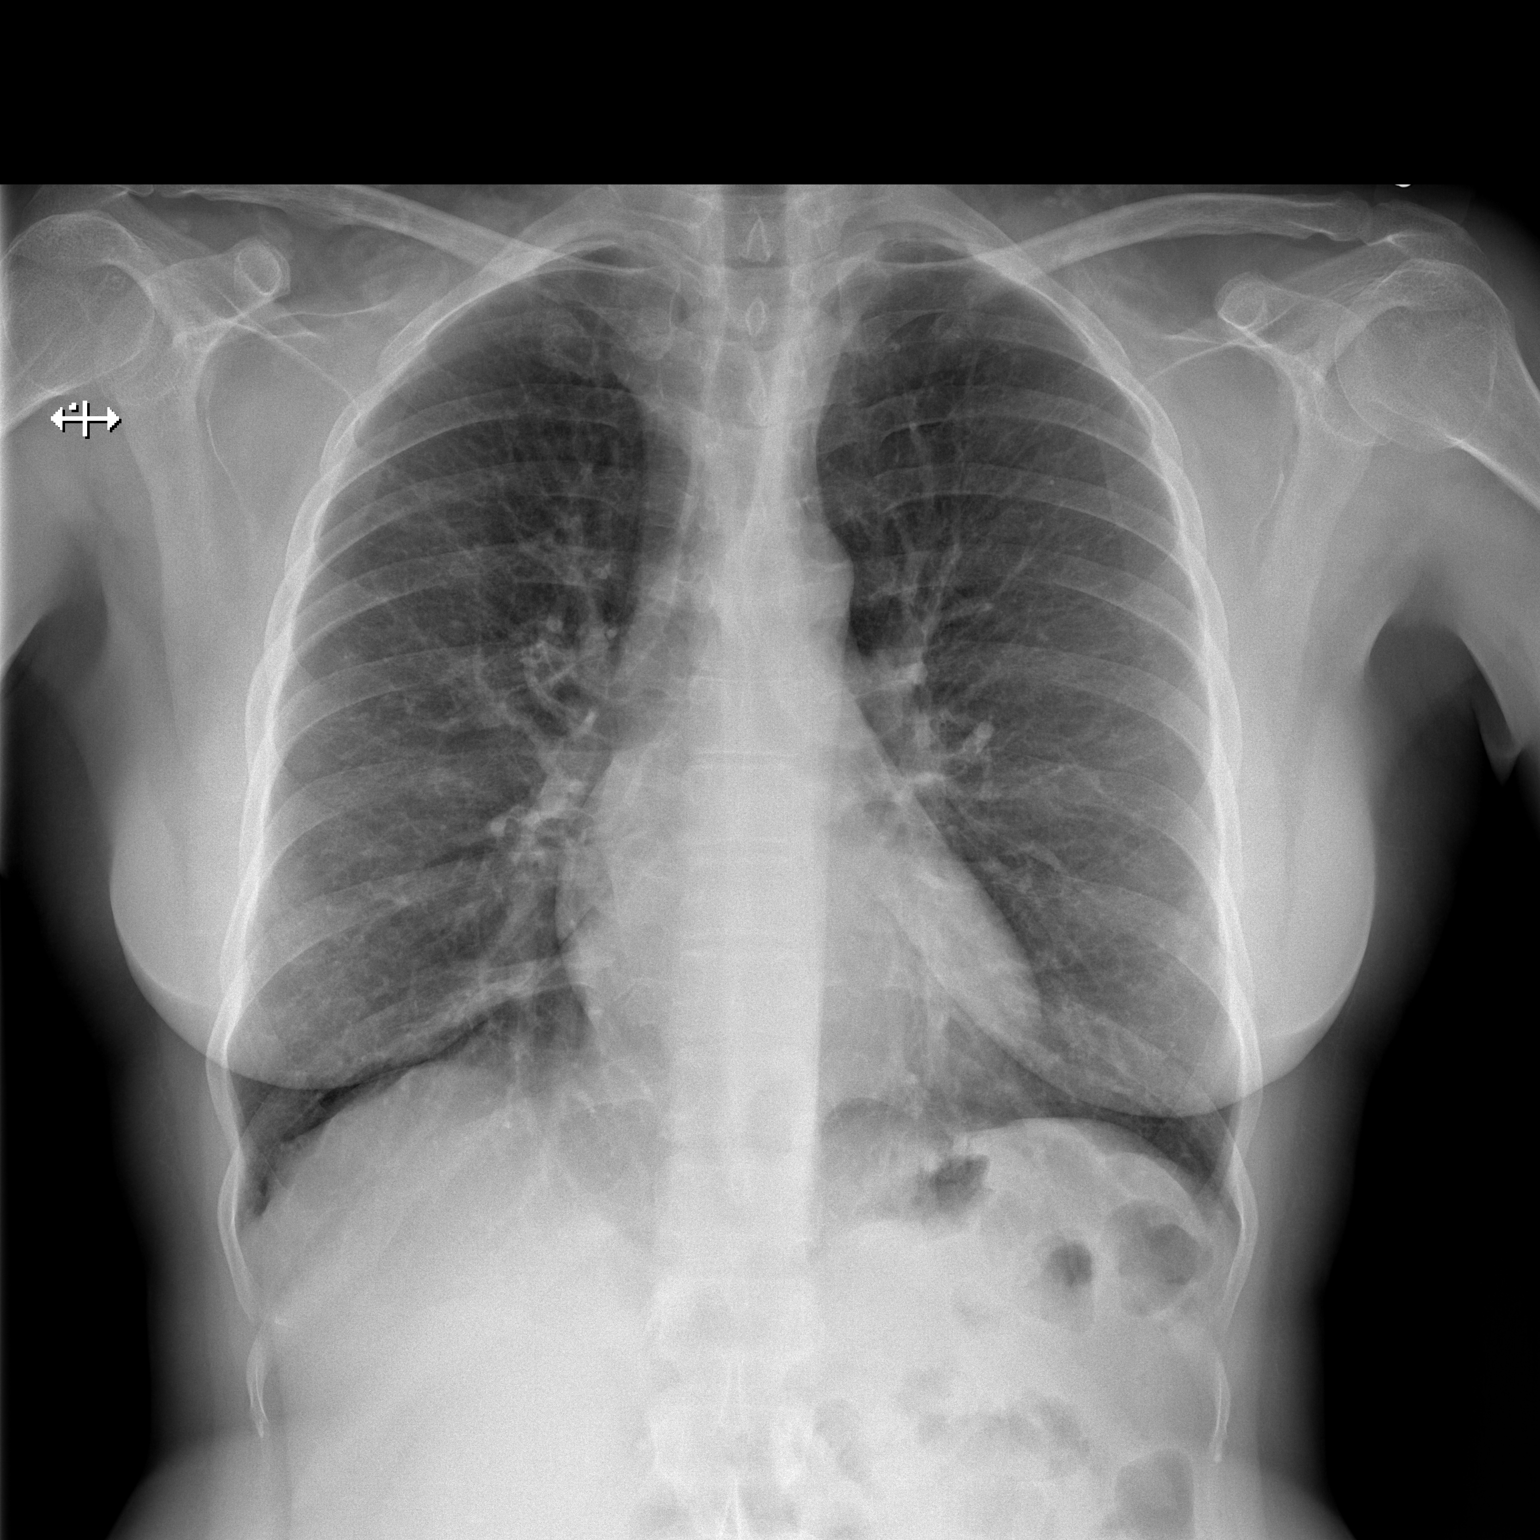

[w chest lat]
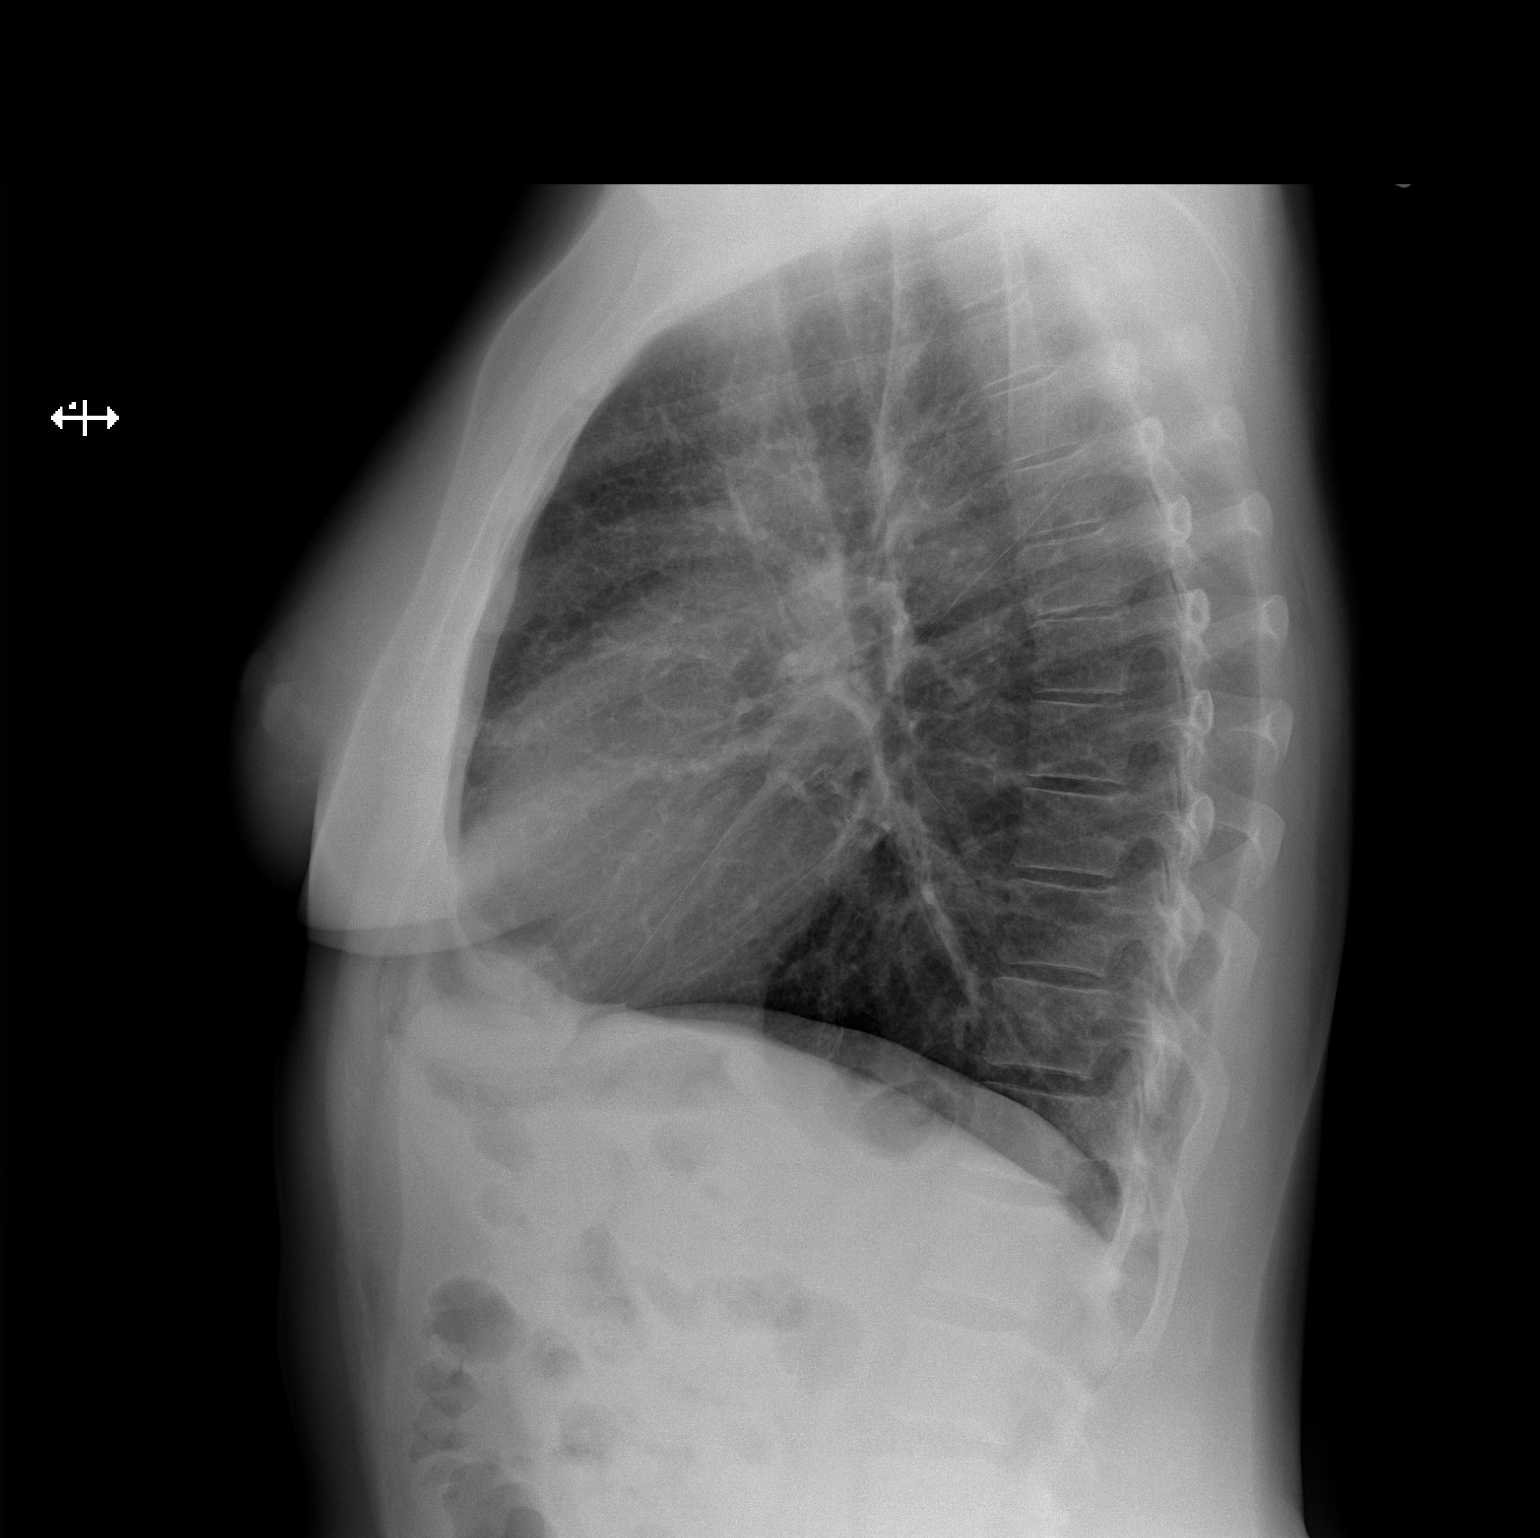

[2 of 2 positions shown; findings below may reference images not displayed]

FINDINGS: No active infiltrate or effusion is seen. Mild
peribronchial thickening is noted which may indicate bronchitis,
possibly chronic in this patient with smoking history.  Mediastinal
contours appear stable.  The heart is within normal limits in size.
No bony abnormality is seen.
IMPRESSION: No active lung disease.  Question chronic bronchitis.

## 2013-06-28 ENCOUNTER — Emergency Department (HOSPITAL_COMMUNITY)
Admission: EM | Admit: 2013-06-28 | Discharge: 2013-06-28 | Disposition: A | Discharge status: Home / Self Care | Payer: No Typology Code available for payment source | Attending: Emergency Medicine | Admitting: Emergency Medicine

## 2013-06-28 ENCOUNTER — Encounter (HOSPITAL_COMMUNITY): Payer: Self-pay | Admitting: Emergency Medicine

## 2013-06-28 DIAGNOSIS — F3289 Other specified depressive episodes: Secondary | ICD-10-CM | POA: Insufficient documentation

## 2013-06-28 DIAGNOSIS — Z791 Long term (current) use of non-steroidal anti-inflammatories (NSAID): Secondary | ICD-10-CM | POA: Insufficient documentation

## 2013-06-28 DIAGNOSIS — K047 Periapical abscess without sinus: Secondary | ICD-10-CM

## 2013-06-28 DIAGNOSIS — K029 Dental caries, unspecified: Secondary | ICD-10-CM | POA: Insufficient documentation

## 2013-06-28 DIAGNOSIS — Z8619 Personal history of other infectious and parasitic diseases: Secondary | ICD-10-CM | POA: Insufficient documentation

## 2013-06-28 DIAGNOSIS — Z79899 Other long term (current) drug therapy: Secondary | ICD-10-CM | POA: Insufficient documentation

## 2013-06-28 DIAGNOSIS — F329 Major depressive disorder, single episode, unspecified: Secondary | ICD-10-CM | POA: Insufficient documentation

## 2013-06-28 DIAGNOSIS — F17200 Nicotine dependence, unspecified, uncomplicated: Secondary | ICD-10-CM | POA: Insufficient documentation

## 2013-06-28 DIAGNOSIS — Z23 Encounter for immunization: Secondary | ICD-10-CM | POA: Insufficient documentation

## 2013-06-28 MED ORDER — TETANUS-DIPHTH-ACELL PERTUSSIS 5-2.5-18.5 LF-MCG/0.5 IM SUSP
0.5000 mL | Freq: Once | INTRAMUSCULAR | Status: AC
  Administered 2013-06-28: 0.5 mL via INTRAMUSCULAR
  Filled 2013-06-28: qty 0.5

## 2013-06-28 MED ORDER — IBUPROFEN 800 MG PO TABS
800.0000 mg | ORAL_TABLET | Freq: Once | ORAL | Status: AC
  Administered 2013-06-28: 800 mg via ORAL
  Filled 2013-06-28: qty 1

## 2013-06-28 MED ORDER — PENICILLIN V POTASSIUM 500 MG PO TABS
500.0000 mg | ORAL_TABLET | Freq: Once | ORAL | Status: AC
  Administered 2013-06-28: 500 mg via ORAL
  Filled 2013-06-28: qty 1

## 2013-06-28 MED ORDER — IBUPROFEN 800 MG PO TABS: 800.0000 mg | ORAL_TABLET | Freq: Three times a day (TID) | ORAL | Status: DC

## 2013-06-28 NOTE — ED Provider Notes (Signed)
CSN: [id number]     Arrival date & time 06/28/13  1503 History  This chart was scribed for non-physician practitioner, Fayrene HelperBowie Ciela Mahajan, PA-C,working with Shelda JakesScott W. Zackowski, MD, by Karle PlumberJennifer Tensley, ED Scribe.  This patient was seen in room WTR6/WTR6 and the patient's care was started at 4:03 PM.  Chief Complaint  Patient presents with  . Abscess on gums    The history is provided by the patient. No language interpreter was used.   HPI Comments:  Mary FurlongDianne Dascoli is a 48 y.o. female living in a shelter, with h/o Hepatitis C and drug abuse who presents to the Emergency Department complaining of severe dental pain secondary to a dental abscess on her upper left-sided gum. She reports swelling to her upper lip. She denies any drainage from the area. She denies fever or chills. Pt is unaware of her last tetanus vaccination. Pt is a smoker.  Past Medical History  Diagnosis Date  . Hernia   . Hepatitis C   . Drug abuse and dependence   . Depression   . H/O hiatal hernia   . Ileus, postoperative 02/13/2013   Past Surgical History  Procedure Laterality Date  . Splenectomy    . Tubal ligation    . Diagnostic laparoscopic liver biopsy N/A 02/10/2013    Procedure: DIAGNOSTIC LAPAROSCOPIC ;  Surgeon: Ardeth SportsmanSteven C. Gross, MD;  Location: [facility];  Service: General;  Laterality: N/A;  DIAGNOSTIC LAPAROSCOPY,laparoscopic ventral hernia repair with mesh lysis of adhesions   No family history on file. History  Substance Use Topics  . Smoking status: Current Every Day Smoker -- 1.00 packs/day for 20 years    Types: Cigarettes  . Smokeless tobacco: Not on file  . Alcohol Use: Yes     Comment: 1 pt daily   OB History   Grav Para Term Preterm Abortions TAB SAB Ect Mult Living                 Review of Systems  Constitutional: Negative for fever and chills.  HENT: Positive for dental problem and facial swelling.     Allergies  Review of patient's allergies indicates no known allergies.  Home Medications    Prior to Admission medications   Medication Sig Start Date End Date Taking? Authorizing Provider  acetaminophen (TYLENOL) 325 MG tablet Take 2 tablets (650 mg total) by mouth every 6 (six) hours as needed. 02/14/13   Sherrie GeorgeWillard Jennings, PA-C  FLUoxetine (PROZAC) 20 MG capsule Take 20 mg by mouth daily.    Historical Provider, MD  ibuprofen (ADVIL,MOTRIN) 800 MG tablet Take 1 tablet (800 mg total) by mouth 3 (three) times daily. 03/24/13   Elson AreasLeslie K Sofia, PA-C  Multiple Vitamins-Minerals (MULTIVITAMIN WITH MINERALS) tablet Take 1 tablet by mouth daily.    Historical Provider, MD  oxyCODONE (OXY IR/ROXICODONE) 5 MG immediate release tablet Take 1-2 tablets (5-10 mg total) by mouth every 4 (four) hours as needed for moderate pain, severe pain or breakthrough pain. 02/14/13   Sherrie GeorgeWillard Jennings, PA-C  polyethylene glycol [facility](MIRALAX / GLYCOLAX) packet Take 17 g by mouth daily as needed (for constipation.). 02/14/13   Sherrie GeorgeWillard Jennings, PA-C  QUEtiapine (SEROQUEL) 100 MG tablet Take 100 mg by mouth at bedtime.    Historical Provider, MD   Triage Vitals: BP 135/75  Pulse 70  Temp(Src) 99.7 F (37.6 C) (Oral)  Resp 19  SpO2 98% Physical Exam  Nursing note and vitals reviewed. Constitutional: She is oriented to person, place, and time. She appears well-developed  and well-nourished.  HENT:  Head: Normocephalic and atraumatic.  Left upper gum with area of fluctuance and induration. Tooth # 11 with dental decay noted.  Eyes: EOM are normal.  Neck: Normal range of motion.  Cardiovascular: Normal rate.   Pulmonary/Chest: Effort normal.  Musculoskeletal: Normal range of motion.  Neurological: She is alert and oriented to person, place, and time.  Skin: Skin is warm and dry.  Psychiatric: She has a normal mood and affect. Her behavior is normal.    ED Course  Procedures (including critical care time) DIAGNOSTIC STUDIES: Oxygen Saturation is 98% on RA, normal by my interpretation.   4:06 PM- Will drain  abscess and prescribe antibiotics. Will update tetanus vaccination. Pt verbalizes understanding and agrees to plan.  INCISION AND DRAINAGE PROCEDURE NOTE: Patient identification was confirmed and verbal consent was obtained. This procedure was performed by Fayrene HelperBowie Rochella Benner, PA-C at 4:19 PM. Site: left-side front gingiva Sterile procedures observed Needle size: 25 G Anesthetic used (type and amt): Lidocaine 2% without Epinephrine (1 mL) Blade size: # 11  Drainage: small amount of purulent  Complexity: Complex Site anesthetized, incision made over site, wound drained and explored loculations, rinsed with copious amounts of normal saline, wound packed with sterile gauze, covered with dry, sterile dressing.  Pt tolerated procedure well without complications.  Instructions for care discussed verbally and pt provided with additional written instructions for homecare and f/u.   Medications  Tdap (BOOSTRIX) injection 0.5 mL (not administered)  penicillin v potassium (VEETID) tablet 500 mg (not administered)    Labs Review Labs Reviewed - No data to display  Imaging Review No results found.   EKG Interpretation None      MDM   Final diagnoses:  Periapical abscess    BP 114/67  Pulse 97  Temp(Src) 99.5 F (37.5 C) (Oral)  Resp 16  SpO2 98%   I personally performed the services described in this documentation, which was scribed in my presence. The recorded information has been reviewed and is accurate.    Fayrene HelperBowie Inigo Lantigua, PA-C 06/28/13 [phone]

## 2013-06-28 NOTE — Discharge Instructions (Signed)
Finish the rest of your amoxicillin.  Take ibuprofen for pain.  Follow up with dentist for further care.    Abscessed Tooth An abscessed tooth is an infection around your tooth. It may be caused by holes or damage to the tooth (cavity) or a dental disease. An abscessed tooth causes mild to very bad pain in and around the tooth. See your dentist right away if you have tooth or gum pain. HOME CARE  Take your medicine as told. Finish it even if you start to feel better.  Do not drive after taking pain medicine.  Rinse your mouth (gargle) often with salt water ( teaspoon salt in 8 ounces of warm water).  Do not apply heat to the outside of your face. GET HELP RIGHT AWAY IF:   You have a temperature by mouth above 102 F (38.9 C), not controlled by medicine.  You have chills and a very bad headache.  You have problems breathing or swallowing.  Your mouth will not open.  You develop puffiness (swelling) on the neck or around the eye.  Your pain is not helped by medicine.  Your pain is getting worse instead of better. MAKE SURE YOU:   Understand these instructions.  Will watch your condition.  Will get help right away if you are not doing well or get worse. Document Released: 08/16/2007 Document Revised: 05/22/2011 Document Reviewed: 06/07/2010 [facility] Patient Information 2014 [employer], [place].

## 2013-06-28 NOTE — ED Notes (Signed)
Pt has a sore on her upper gums near her front teeth. Pt states it has been there for about 2 weeks. Pt states it is hard to chew food. Pt has no acute distress. States she has been taking Aleve for pain.

## 2013-06-29 NOTE — ED Provider Notes (Signed)
Medical screening examination/treatment/procedure(s) were performed by non-physician practitioner and as supervising physician I was immediately available for consultation/collaboration.   EKG Interpretation None        Shelda JakesScott W. Maryse Brierley, MD 06/29/13 1537

## 2014-01-18 IMAGING — CR DG ABDOMEN 1V
2 series · 2 of 2 positions shown · non-contrast
Comparison: 02/10/2013 CT.

CLINICAL DATA: Hernia repair 1 month ago. Complaining of pain at
this location.

EXAM:
ABDOMEN - 1 VIEW

[t abdomen supine (1 of 2)]
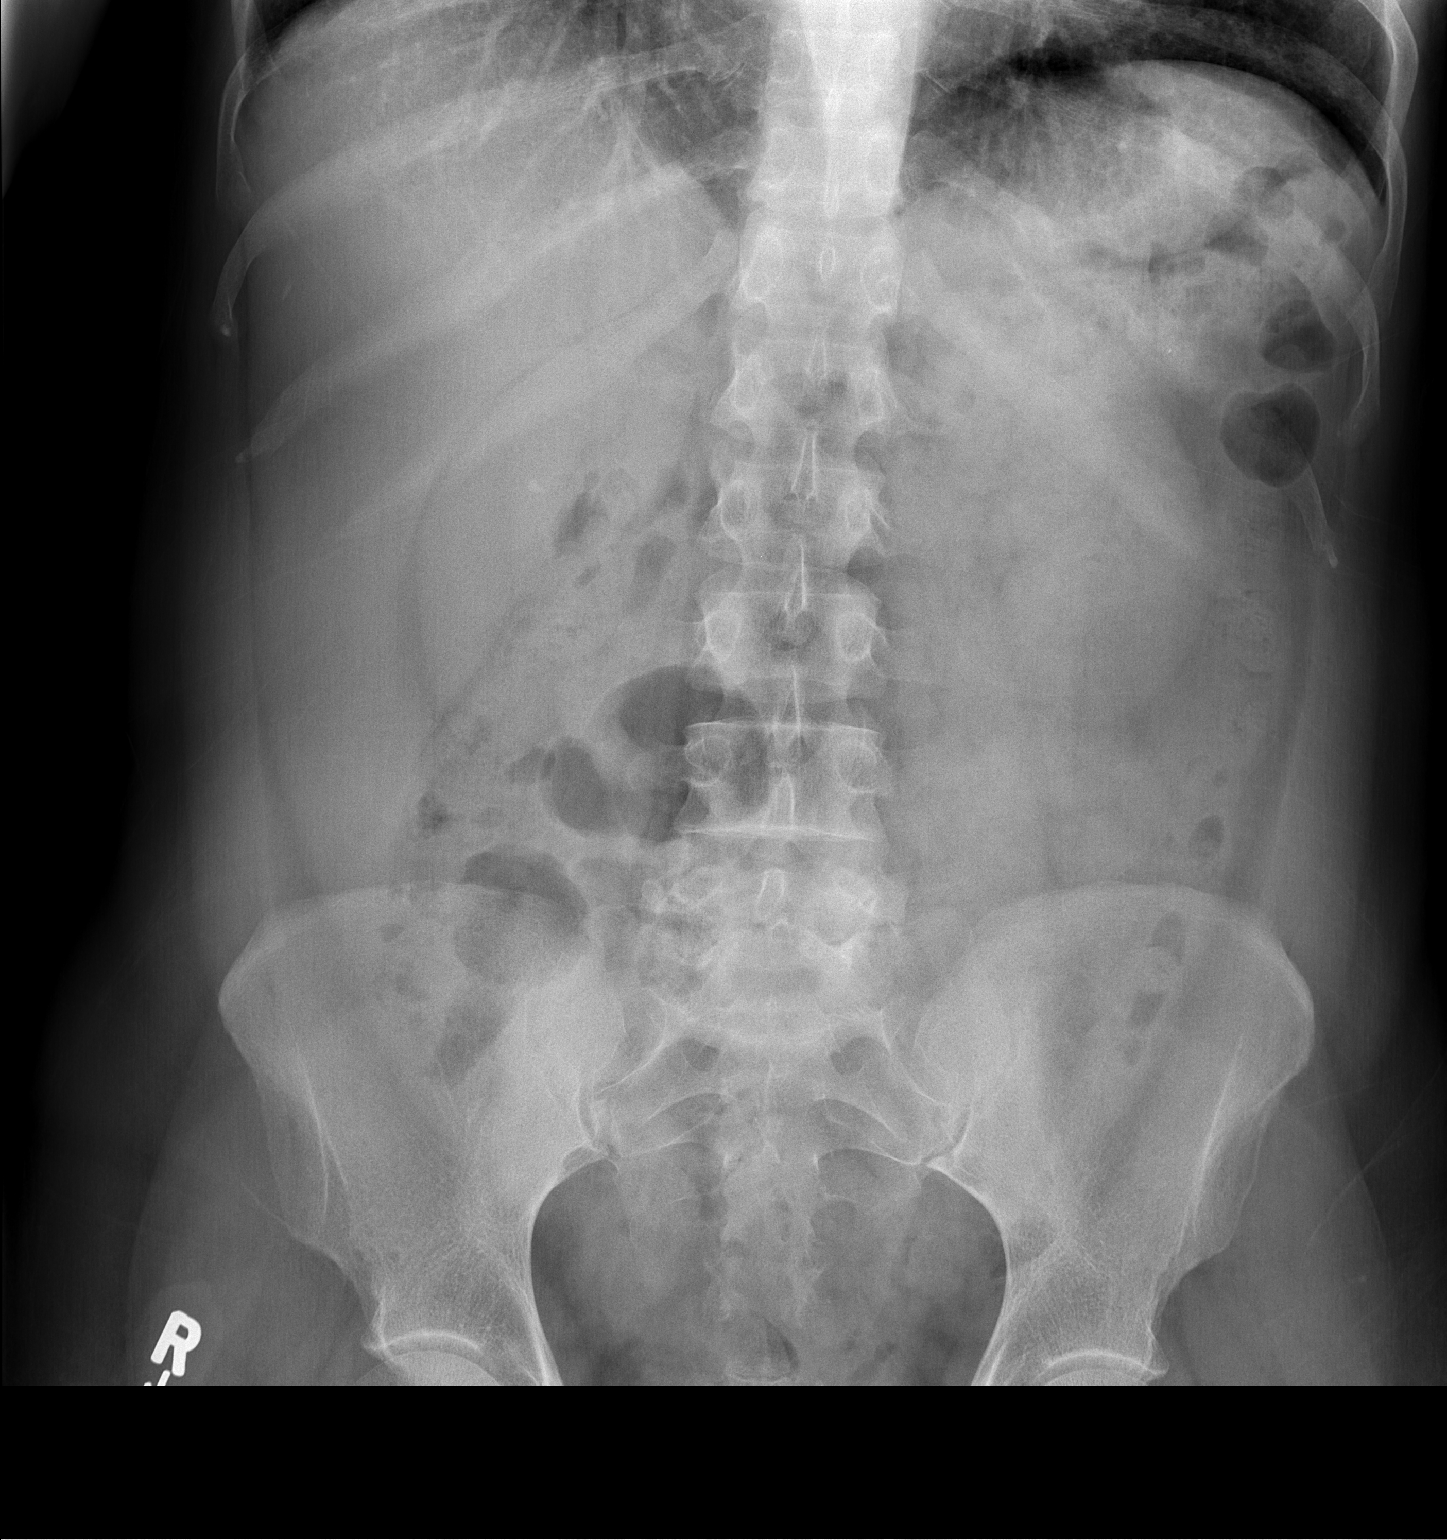

[t abdomen supine (2 of 2)]
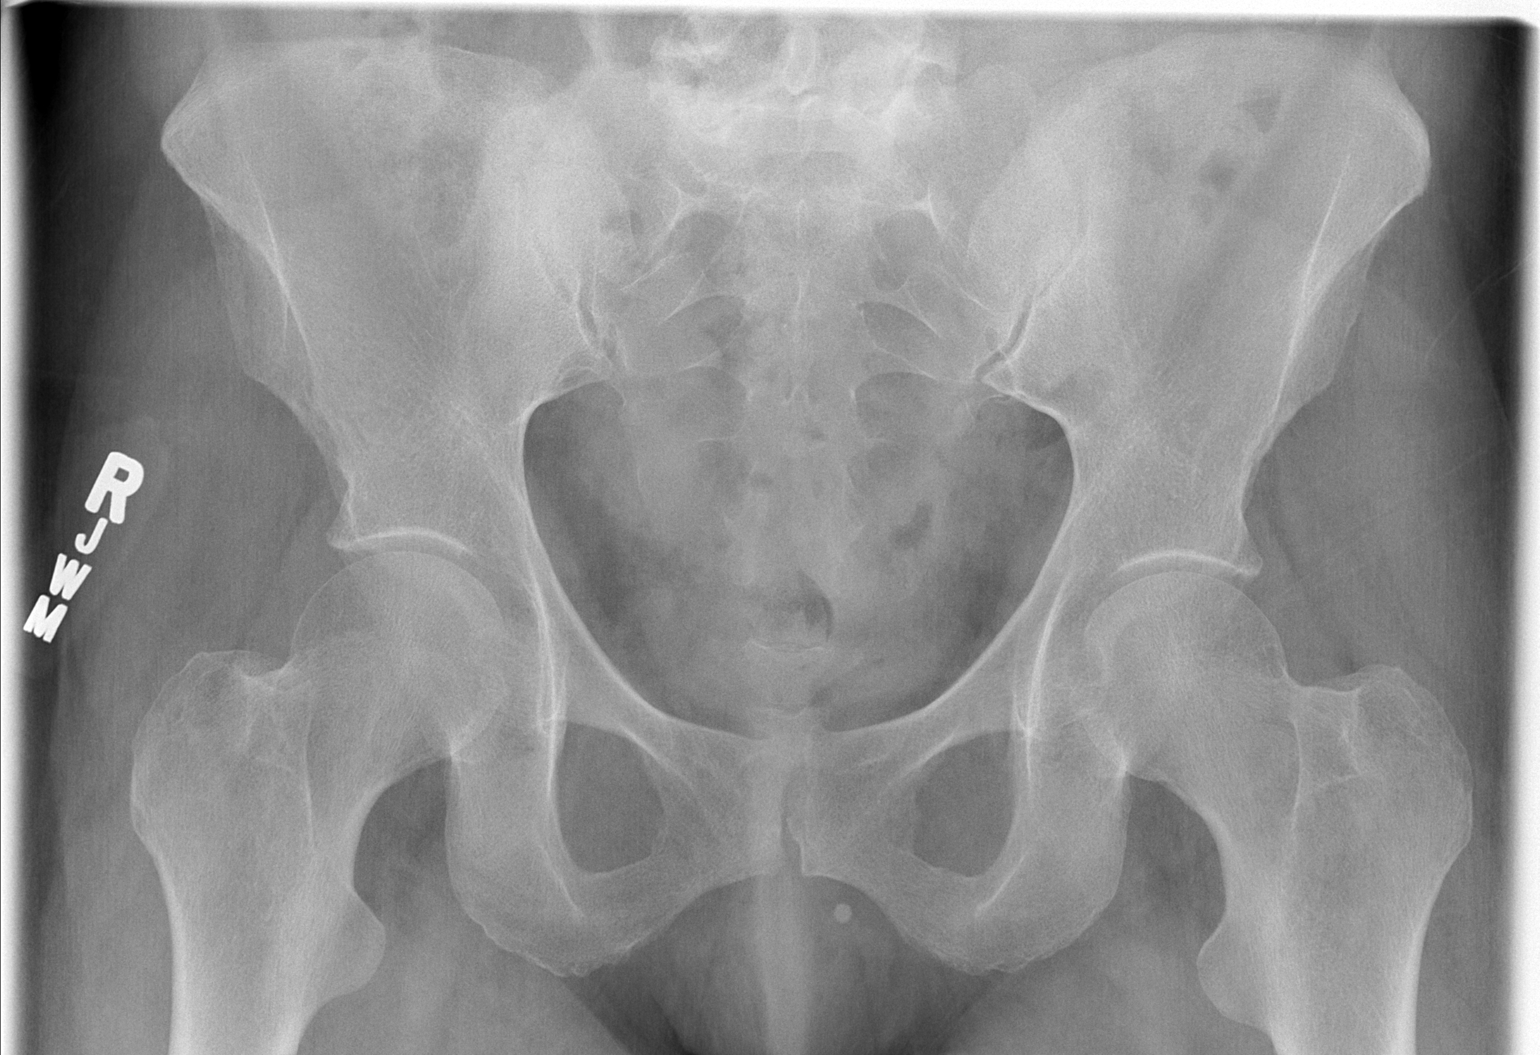

[2 of 2 positions shown; findings below may reference images not displayed]

FINDINGS: Limited evaluation of the periumbilical region where the patient
previously was noted to have hernia. At this level, gas containing
small bowel loops are noted. I cannot determine if these enter
recurrent hernia on this single projection.

The possibility of free intraperitoneal air cannot be assessed on a
supine view.

Right renal calculi.

Degenerative changes L5-S1.
IMPRESSION: Limited evaluation of the periumbilical region where the patient
previously was noted to have hernia. At this level, gas containing
small bowel loops are noted. I cannot determine if these enter
recurrent hernia on this single projection.

## 2014-08-23 ENCOUNTER — Encounter (HOSPITAL_COMMUNITY): Payer: Self-pay | Admitting: *Deleted

## 2014-08-23 ENCOUNTER — Emergency Department (HOSPITAL_COMMUNITY): Payer: No Typology Code available for payment source

## 2014-08-23 ENCOUNTER — Emergency Department (HOSPITAL_COMMUNITY)
Admission: EM | Admit: 2014-08-23 | Discharge: 2014-08-23 | Disposition: A | Discharge status: Home / Self Care | Payer: No Typology Code available for payment source | Attending: Emergency Medicine | Admitting: Emergency Medicine

## 2014-08-23 DIAGNOSIS — Z8719 Personal history of other diseases of the digestive system: Secondary | ICD-10-CM | POA: Insufficient documentation

## 2014-08-23 DIAGNOSIS — Z8619 Personal history of other infectious and parasitic diseases: Secondary | ICD-10-CM | POA: Insufficient documentation

## 2014-08-23 DIAGNOSIS — Z720 Tobacco use: Secondary | ICD-10-CM | POA: Insufficient documentation

## 2014-08-23 DIAGNOSIS — Z8659 Personal history of other mental and behavioral disorders: Secondary | ICD-10-CM | POA: Insufficient documentation

## 2014-08-23 DIAGNOSIS — R21 Rash and other nonspecific skin eruption: Secondary | ICD-10-CM

## 2014-08-23 DIAGNOSIS — Z3202 Encounter for pregnancy test, result negative: Secondary | ICD-10-CM | POA: Insufficient documentation

## 2014-08-23 DIAGNOSIS — R110 Nausea: Secondary | ICD-10-CM | POA: Insufficient documentation

## 2014-08-23 DIAGNOSIS — Z791 Long term (current) use of non-steroidal anti-inflammatories (NSAID): Secondary | ICD-10-CM | POA: Insufficient documentation

## 2014-08-23 LAB — POC URINE PREG, ED: PREG TEST UR: NEGATIVE

## 2014-08-23 MED ORDER — DIPHENHYDRAMINE HCL 25 MG PO TABS: 25.0000 mg | ORAL_TABLET | Freq: Three times a day (TID) | ORAL | Status: DC | PRN

## 2014-08-23 MED ORDER — PREDNISONE 20 MG PO TABS: ORAL_TABLET | ORAL | Status: DC

## 2014-08-23 NOTE — ED Notes (Signed)
Raised red itching rash for 5 days.  She also has some rt upper arm and some starting on her lt face  lmp now

## 2014-08-23 NOTE — Discharge Instructions (Signed)
Please call your doctor for a followup appointment within 24-48 hours. When you talk to your doctor please let them know that you were seen in the emergency department and have them acquire all of your records so that they can discuss the findings with you and formulate a treatment plan to fully care for your new and ongoing problems. Please follow-up with health and wellness Center Please take into consideration was placed into on to your body Please use medications as prescribed While taking Benadryl this can result in drowsiness and sedation please do not take any other medications that can cause drowsiness or sedation. Please no drinking alcohol, driving, operating any heavy machinery while taking Benadryl. Please continue to monitor symptoms closely and if symptoms are to worsen or change (fever greater than 101, chills, sweating, nausea, vomiting, chest pain, shortness of breathe, difficulty breathing, weakness, numbness, tingling, worsening or changes to pain pattern, spreading of the rash, red streaks, arm swelling, tongue swelling, lip swelling, mouth swelling, throat closing sensations) please report back to the Emergency Department immediately.   Rash A rash is a change in the color or texture of your skin. There are many different types of rashes. You may have other problems that accompany your rash. CAUSES   Infections.  Allergic reactions. This can include allergies to pets or foods.  Certain medicines.  Exposure to certain chemicals, soaps, or cosmetics.  Heat.  Exposure to poisonous plants.  Tumors, both cancerous and noncancerous. SYMPTOMS   Redness.  Scaly skin.  Itchy skin.  Dry or cracked skin.  Bumps.  Blisters.  Pain. DIAGNOSIS  Your caregiver may do a physical exam to determine what type of rash you have. A skin sample (biopsy) may be taken and examined under a microscope. TREATMENT  Treatment depends on the type of rash you have. Your caregiver may  prescribe certain medicines. For serious conditions, you may need to see a skin doctor (dermatologist). HOME CARE INSTRUCTIONS   Avoid the substance that caused your rash.  Do not scratch your rash. This can cause infection.  You may take cool baths to help stop itching.  Only take over-the-counter or prescription medicines as directed by your caregiver.  Keep all follow-up appointments as directed by your caregiver. SEEK IMMEDIATE MEDICAL CARE IF:  You have increasing pain, swelling, or redness.  You have a fever.  You have new or severe symptoms.  You have body aches, diarrhea, or vomiting.  Your rash is not better after 3 days. MAKE SURE YOU:  Understand these instructions.  Will watch your condition.  Will get help right away if you are not doing well or get worse. Document Released: 02/17/2002 Document Revised: 05/22/2011 Document Reviewed: 12/12/2010 [facility] Patient Information 2015 [place], [place]. This information is not intended to replace advice given to you by your health care provider. Make sure you discuss any questions you have with your health care provider.   Emergency Department Resource Guide 1) Find a Doctor and Pay Out of Pocket Although you won't have to find out who is covered by your insurance plan, it is a good idea to ask around and get recommendations. You will then need to call the office and see if the doctor you have chosen will accept you as a new patient and what types of options they offer for patients who are self-pay. Some doctors offer discounts or will set up payment plans for their patients who do not have insurance, but you will need to ask so you  aren't surprised when you get to your appointment.  2) Contact Your Local Health Department Not all health departments have doctors that can see patients for sick visits, but many do, so it is worth a call to see if yours does. If you don't know where your local health department is, you can  check in your phone book. The CDC also has a tool to help you locate your state's health department, and many state websites also have listings of all of their local health departments.  3) Find a Walk-in Clinic If your illness is not likely to be very severe or complicated, you may want to try a walk in clinic. These are popping up all over the country in pharmacies, drugstores, and shopping centers. They're usually staffed by nurse practitioners or physician assistants that have been trained to treat common illnesses and complaints. They're usually fairly quick and inexpensive. However, if you have serious medical issues or chronic medical problems, these are probably not your best option.  No Primary Care Doctor: - Call Health Connect at  [phone] - they can help you locate a primary care doctor that  accepts your insurance, provides certain services, etc. - Physician Referral Service- [phone]  Chronic Pain Problems: Organization         Address  Phone   Notes  [facility]  [phone] Patients need to be referred by their primary care doctor.   Medication Assistance: Organization         Address  Phone   Notes  [facility] Medication [facility] [address] [place], [place] [address] [phone] --Must be a resident of [facility] -- Must have NO insurance coverage whatsoever (no Medicaid/ Medicare, etc.) -- The pt. MUST have a primary care doctor that directs their care regularly and follows them in the community   [facility]ist  [phone]   [employer]  [phone]    Agencies that provide inexpensive medical care: Organization         Address  Phone   Notes  [facility]  [phone]   [facility]    [phone]   [facility] [address] [place], [place] [address] [phone]   [facility] [address] [place], [place] [phone]    Planned Parenthood    [phone]   [facility]    [phone]   [facility]  [address], [place] Phone:  [phone], Fax:  [phone] Hours of Operation:  9 am - 6 pm, M-F.  Also accepts Medicaid/Medicare and self-pay.  [facility]  [address], [place] Phone: [phone], Fax: [phone]. Hours of Operation:  8:30 am - 5:30 pm, M-F.  Also accepts Medicaid and self-pay.  [facility] [place] [address] [place] Phone: [phone]   [facility] [address] Natasha Bence [place], [place] [phone], Ext. [phone] Mondays & Thursdays: 7-9 AM.  First 15 patients are seen on a first come, first serve basis.    Medicaid-accepting [facility] Providers:  Organization         Address  Phone   Notes  [facility] [address], [place] [phone] Also accepts self-pay patients.  [facility] [address] Laurell Josephs [place], [place]  [phone]  [facility] [address], [place] [phone]   [facility] [address] [place] [phone]   Renaye Rakers [address], [place]   [phone] Only accepts [place] patients after they have their name applied to their card.   Self-Pay (no insurance) in [facility]:  Organization         Address  Phone   Notes  Sickle Cell Patients, [facility] [address] [place], [place] [phone]   [facility] [address] [place], [place] [phone]   [facility] [place]  [address], [place] [phone]   [facility]/Dr. Osei-Bonsu  [address] [place] or [address], [place] [phone] Phone number for both [place] and [place] locations is the  same.  [facility]gent [facility] and [facility] [address] [place] [phone]   [facility] [address], [place] or [address] [phone]   [facility] [address], [place] [phone], phone; [phone], fax Sees patients 1st and 3rd Saturday of every month.  Must not qualify for public or private insurance (i.e. Medicaid, Medicare, [place] Health Choice, Veterans' Benefits)  Household income should be no more than 200% of the poverty level The clinic cannot treat you if you are pregnant or think you are pregnant  Sexually transmitted diseases are not treated at the clinic.    Dental Care: Organization         Address  Phone  Notes  [facility] [address] [place], [place] [phone] Accepts children up to age 72 who are enrolled in [place] or [place] Health Choice; pregnant women with a Medicaid card; and children who have applied for Medicaid or [place] Health Choice, but were declined, whose parents can pay a reduced fee at time of service.  [facility]  [address], [place] [phone] Accepts children up to age 6 who are enrolled in [place] or [place] Health Choice; pregnant women with a Medicaid card; and children who have applied for Medicaid or [place] Health Choice, but were declined, whose parents can pay a reduced fee at time of service.  [facility]  [address] [place], [place] [phone] Patients are seen by appointment only. Walk-ins are not accepted. [facility] will see patients 53 years of age and older. Monday - Tuesday (8am-5pm) Most Wednesdays (8:30-5pm) $30 per visit, cash only  [facility]  [address], [facility] [phone] Patients are seen by appointment only. Walk-ins are not accepted. [facility] will see patients  40 years of age and older. One Wednesday Evening (Monthly: Volunteer Based).  $30 per visit, cash only  [employer]  [phone] for adults; Children under age 12, call Graduate Pediatric Dentistry at [phone]. Children aged 20-14, please call [phone] to request a pediatric application.  Dental services are provided in all areas of dental care including fillings, crowns and bridges, complete and partial dentures, implants, gum treatment, root canals, and extractions. Preventive care is also provided. Treatment is provided to both adults and children. Patients are selected via a lottery and there is often a waiting list.  [facility] [address], [place]  [phone] [email or web address]   [facility] [address], [place], [place] [phone], Ext. [phone] Second and Fourth Thursday of each month, opens at 6:30 AM; Clinic ends at 9 AM.  Patients are seen on a first-come first-served basis, and a limited number are seen during each clinic.   [facility]  [address], [place], [place] [phone]   Eligibility Requirements You must have lived in [place], [place], or [place] counties for at least the last three months.   You cannot be eligible for state or federal sponsored [place] Cityhealthcare insurance, including CIG[employer]eterans Administration, [place]icaid, or [employer]icare.   You generally cannot be eligible for healthcare insurance through your employer.    How to apply: Eligibility screenings are held every Tuesday and Wednesday afternoon from 1:00 pm until 4:00 pm. You do not need an appointment for the interview!  [facility] [address], [place], [place] [phone]   [facility]  [phone]   [facility]  [phone]   [facility]  [phone]    Behavioral Health Resources in the Community: Intensive Outpatient  Programs Organization         Address  Phone  Notes  [facility] [address], [place], [place] [phone]   [facility] [address], [place], [place] [phone]   [facility] [address], [place], [place]  [phone]   [facility] [address],  [place], [place] [phone] or [phone]   Substance Abuse Resources Organization         Address  Phone  Notes  [facility]  [phone]   [facility]  [phone]   [facility]  [phone]   [facility]  [phone]   Residential & Outpatient Substance Abuse Program  [phone]   Psychological Services Organization         Address  Phone  Notes  [facility]  [phone]   [facility]  [phone]- [phone]   [facility] [address], [place] [phone] or [phone]    Mobile Crisis Teams Organization         Address  Phone  Notes  [facility], Mobile Crisis Care Unit  [phone]   [facility]  [address] [place], [place] [phone]   Doristine LocksSharon DeEsch [address] [place] [phone]    Self-Help/Support Groups Organization         Address  Phone             Notes  [facility] [place] - variety of support groups  336- [phone] Call for more information  Narcotics Anonymous (NA), [facility] [address], [place]  2 meetings at this location   Statisticianesidential Treatment Programs Organization         Address  Phone  Notes  [facility] [address],    [place]  [phone]   [facility]  [address], [place] [phone], [place], [place] [phone]   [facility] [address], [place] [phone] Admissions: 8am-3pm M-F  [facility] [address]    [place], [place]  [phone]   [facility] [address], [place], [place] [phone]   [facility] [address]  Barbara Cower  [place], [place]   [facility] - Intensive Outpatient [address], [place], [place]   [facility] [place].,  [place], [place] [phone] or [phone]   [facility] (RT[facility]) [address] [place], [place] Accepts Medicaid  [facility] [address]  [place] [phone] Substance Abuse/Addiction Treatment   [facility] Organization         Address  Phone  Notes  [facility]  [phone]   Domenic Schwab, PhD [address] Arlis Porta [place], [place]   [phone] or [phone]   [place], [place] [phone]   [address] [place], [place] [phone] Insurance/Medicaid/sponsorship through [facility] Families [address]                                    [place], [place] [phone] [place] [address] [place] [address], [place] [phone]    Dr. Adele Schilder  [phone]   [facility] Dept. 1) [address], [place] 2) [place] 3)  [place] [address], [place] [phone]  [phone]   [place] [phone] or [phone] (After Hours)

## 2014-08-23 NOTE — ED Notes (Signed)
Pt requesting food after returning to room from xray; pt instructed no food until we have all results; pt verbalized understanding

## 2014-08-23 NOTE — ED Notes (Signed)
At time of D/C p reported to PA  Recent  Nausea and diarrhea. Pt has a Hx of post -op ileus

## 2014-08-23 NOTE — ED Provider Notes (Signed)
CSN: [id number]     Arrival date & time 08/23/14  1601 History   None   This chart was scribed for non-physician practitioner, [name], [place], working with Pamella Pert, MD by Terressa Koyanagi, ED Scribe. This patient was seen in room TR08C/TR08C and the patient's care was started at 6:35 PM.  Chief Complaint  Patient presents with  . Rash   The history is provided by the patient. No language interpreter was used.   PCP: No primary care provider on file. HPI Comments: Mary [name] is a 49 y.o. female, with Hx of hernia, hepatitis C, drug abuse and dependence, depression, hiatal hernia, ileus (postoperative), and daily tobacco use (1 ppd), who presents to the Emergency Department complaining of sudden onset rash to the right arm (onset 5 days ago) and spreading to the face today. Patient reported that after she noticed it to her right arm, stated that his desperate to bilateral aspects of her face mainly to her cheeks. States that these are red raised lesions. Denied changes in size or length. Pt also complains of associated itching to the affected areas. Pt reports taking OTC anti-itch meds without relief. Pt denies taking benadryl at home for her Sx. patient reported that she is also been experiencing nausea and diarrhea for the past couple of days. Reported that she's been able to keep some fluids down. Denied fever, chills, discharge from affected areas, neck pain, neck stiffness, abd pain, dizziness, syncope, sick contact, SOB, tongue swelling, trouble swallowing, throat closing sensation, rash to chest or back, chest pain, cough, nasal congestion, changes in medication, changes in hygiene products (including perfume, makeup, laundry detergent, lotions, soaps, deodorant, shampoo, conditioner), new clothes, new foods, Hx of chicken pox, rash to palms of hands or soles of feet, Hx of DM, insect bite.  PCP none   Past Medical History  Diagnosis Date  . Hernia   . Hepatitis C   . Drug abuse  and dependence   . Depression   . H/O hiatal hernia   . Ileus, postoperative 02/13/2013   Past Surgical History  Procedure Laterality Date  . Splenectomy    . Tubal ligation    . Diagnostic laparoscopic liver biopsy N/A 02/10/2013    Procedure: DIAGNOSTIC LAPAROSCOPIC ;  Surgeon: Adin Hector, MD;  Location: [facility];  Service: General;  Laterality: N/A;  DIAGNOSTIC LAPAROSCOPY,laparoscopic ventral hernia repair with mesh lysis of adhesions   No family history on file. History  Substance Use Topics  . Smoking status: Current Every Day Smoker -- 1.00 packs/day for 20 years    Types: Cigarettes  . Smokeless tobacco: Not on file  . Alcohol Use: Yes     Comment: 1 pt daily   OB History    No data available     Review of Systems  Constitutional: Negative for fever and chills.  HENT: Negative for congestion and trouble swallowing.   Respiratory: Negative for cough and shortness of breath.   Cardiovascular: Negative for chest pain.  Gastrointestinal: Positive for nausea. Negative for vomiting and abdominal pain.  Musculoskeletal: Negative for neck pain and neck stiffness.  Skin: Positive for rash.  Neurological: Negative for dizziness and syncope.      Allergies  Review of patient's allergies indicates no known allergies.  Home Medications   Prior to Admission medications   Medication Sig Start Date End Date Taking? Authorizing Provider  amoxicillin (AMOXIL) 500 MG capsule Take 500 mg by mouth 3 (three) times daily. Course is for 10  days. Started on 06-24-13 06/24/13   Historical Provider, MD  diclofenac (VOLTAREN) 50 MG EC tablet Take 50 mg by mouth 2 (two) times daily.    Historical Provider, MD  diphenhydrAMINE (BENADRYL) 25 MG tablet Take 1 tablet (25 mg total) by mouth every 8 (eight) hours as needed for itching. 08/23/14   Levina Boyack, PA-C  ibuprofen (ADVIL,MOTRIN) 800 MG tablet Take 1 tablet (800 mg total) by mouth 3 (three) times daily. 06/28/13   Domenic Moras, PA-C   predniSONE (DELTASONE) 20 MG tablet 3 tabs po day one, then 2 tabs daily x 4 days 08/23/14   [name], PA-C   Triage Vitals: BP 110/74 mmHg  Pulse 94  Temp(Src) 98.3 F (36.8 C) (Oral)  Resp 18  Ht 5\' 4"  (1.626 m)  Wt 196 lb (88.905 kg)  BMI 33.63 kg/m2  SpO2 96%  LMP 08/23/2014 Physical Exam  Constitutional: She is oriented to person, place, and time. She appears well-developed and well-nourished. No distress.  HENT:  Head: Normocephalic and atraumatic.  Mouth/Throat: Oropharynx is clear and moist. No oropharyngeal exudate.  Negative tongue swelling Negative angioedema  Eyes: Conjunctivae and EOM are normal. Right eye exhibits no discharge. Left eye exhibits no discharge.  Neck: Normal range of motion. Neck supple. No tracheal deviation present.  Cardiovascular: Normal rate, regular rhythm and normal heart sounds.  Exam reveals no friction rub.   No murmur heard. Pulses:      Radial pulses are 2+ on the right side, and 2+ on the left side.  Pulmonary/Chest: Effort normal and breath sounds normal. No respiratory distress. She has no wheezes. She has no rales.  Patient is able to speak in full sentences without difficulty Negative use of excess her muscles Negative stridor  Abdominal: Soft. Bowel sounds are normal. She exhibits no distension. There is no tenderness. There is no rebound and no guarding.  Musculoskeletal: Normal range of motion.  Lymphadenopathy:    She has no cervical adenopathy.  Neurological: She is alert and oriented to person, place, and time. No cranial nerve deficit. She exhibits normal muscle tone. Coordination normal.  Skin: Skin is warm and dry. Rash noted. She is not diaphoretic. No erythema.  Red raised lesions identified to the right arm mainly localized to the right humerus with negative active bleeding or drainage. Some are scabbed over from where the patient was itching. Negative bleeding noted at this time. Negative warmth upon palpation.  Negative red streaks. Negative swelling to the arm. Full range of motion to the right upper extremity. Small red rates lesions identified to the cheeks bilaterally, scattered.  Psychiatric: She has a normal mood and affect. Her behavior is normal. Thought content normal.  Nursing note and vitals reviewed.   ED Course  Procedures (including critical care time) DIAGNOSTIC STUDIES: Oxygen Saturation is 96% on RA, nl by my interpretation.    COORDINATION OF CARE: 6:42 PM-Discussed treatment plan which includes meds (including prednisone and benadryl), driving while on benadryl, ingesting other meds with sedative affect, dermatology referral, health and wellness referral, allergy test with pt at bedside and pt agreed to plan.    Results for orders placed or performed during the hospital encounter of 08/23/14  POC urine preg, ED (not at [facility])  Result Value Ref Range   Preg Test, Ur NEGATIVE NEGATIVE    Labs Review Labs Reviewed  POC URINE PREG, ED    Imaging Review Dg Abd Acute W/chest  08/23/2014   CLINICAL DATA:  Nausea, diarrhea for  2 weeks.  EXAM: DG ABDOMEN ACUTE W/ 1V CHEST  COMPARISON:  03/24/2013  FINDINGS: There is no evidence of dilated bowel loops or free intraperitoneal air. No radiopaque calculi or other significant radiographic abnormality is seen. Heart size and mediastinal contours are within normal limits. Both lungs are clear.  IMPRESSION: Negative abdominal radiographs.  No acute cardiopulmonary disease.   Electronically Signed   By: Kathreen Devoid   On: 08/23/2014 19:35     EKG Interpretation None      MDM   Final diagnoses:  Rash and nonspecific skin eruption    Medications - No data to display  Filed Vitals:   08/23/14 1607  BP: 110/74  Pulse: 94  Temp: 98.3 F (36.8 C)  TempSrc: Oral  Resp: 18  Height: 5\' 4"  (1.626 m)  Weight: 196 lb (88.905 kg)  SpO2: 96%   I personally performed the services described in this documentation, which was scribed in my  presence. The recorded information has been reviewed and is accurate.  Urine pregnancy negative. Plain film of acute abdomen with chest negative abdominal radiographs. No acute cardiopulmonary disease. Doubt shingles. Doubt erythema multiforme major and minor. Doubt discoid rash/lupus. Doubt poison ivy or oak. Negative findings of cellulitic infection. Definitive etiology of rash unknown. Will treat patient with Benadryl and prednisone. Patient reported that she has been having nausea, was able to drink ginger ale in ED setting. Does history of ileus. Denied abdominal pain. Abdomen soft upon palpation about pain-benign abdominal exam. Pain film imaging unremarkable-negative findings of ileus or free air in the abdomen. Patient stable, afebrile. Patient not septic appearing. Negative signs of respiratory distress. Negative signs of anaphylactic reaction. Negative episodes of emesis in ED setting, tolerated fluids by mouth without difficulty. Discharged patient. Discharge patient with prednisone and Benadryl. Discussed with patient proper use of Benadryl. Referred patient to health and wellness Center and dermatologist. Discussed with patient to closely monitor symptoms and if symptoms are to worsen or change to report back to the ED - strict return instructions given.  Patient agreed to plan of care, understood, all questions answered.   [name], PA-C 08/23/14 2006  Pamella Pert, MD 08/24/14 [phone]

## 2014-09-08 ENCOUNTER — Emergency Department (HOSPITAL_COMMUNITY)
Admission: EM | Admit: 2014-09-08 | Discharge: 2014-09-09 | Disposition: A | Discharge status: Home / Self Care | Payer: No Typology Code available for payment source | Attending: Emergency Medicine | Admitting: Emergency Medicine

## 2014-09-08 ENCOUNTER — Encounter (HOSPITAL_COMMUNITY): Payer: Self-pay | Admitting: Emergency Medicine

## 2014-09-08 DIAGNOSIS — Z791 Long term (current) use of non-steroidal anti-inflammatories (NSAID): Secondary | ICD-10-CM | POA: Insufficient documentation

## 2014-09-08 DIAGNOSIS — Z720 Tobacco use: Secondary | ICD-10-CM | POA: Insufficient documentation

## 2014-09-08 DIAGNOSIS — Z8719 Personal history of other diseases of the digestive system: Secondary | ICD-10-CM | POA: Insufficient documentation

## 2014-09-08 DIAGNOSIS — L0291 Cutaneous abscess, unspecified: Secondary | ICD-10-CM

## 2014-09-08 DIAGNOSIS — Z8659 Personal history of other mental and behavioral disorders: Secondary | ICD-10-CM | POA: Insufficient documentation

## 2014-09-08 DIAGNOSIS — L02416 Cutaneous abscess of left lower limb: Secondary | ICD-10-CM | POA: Insufficient documentation

## 2014-09-08 DIAGNOSIS — Z8619 Personal history of other infectious and parasitic diseases: Secondary | ICD-10-CM | POA: Insufficient documentation

## 2014-09-08 NOTE — ED Notes (Signed)
Pt presents to ED c/o what she believes to be a spider bite to her outer left ankle area.  The area is visibly swollen and red and she states that it is slightly warm to the touch.  Pain is reported as 3/10 and the area is not itchy.  She has not taken meds to treat her symptoms. She denies muscle spasms, vomiting, diarrhea, fever but states that she has felt nauseated. She "popped" the area yesterday and some pus drained out.

## 2014-09-09 MED ORDER — SULFAMETHOXAZOLE-TRIMETHOPRIM 800-160 MG PO TABS
1.0000 | ORAL_TABLET | Freq: Once | ORAL | Status: AC
  Administered 2014-09-09: 1 via ORAL
  Filled 2014-09-09: qty 1

## 2014-09-09 MED ORDER — SULFAMETHOXAZOLE-TRIMETHOPRIM 800-160 MG PO TABS: 1.0000 | ORAL_TABLET | Freq: Two times a day (BID) | ORAL | Status: DC

## 2014-09-09 MED ORDER — LIDOCAINE-EPINEPHRINE 2 %-1:100000 IJ SOLN
20.0000 mL | Freq: Once | INTRAMUSCULAR | Status: AC
  Administered 2014-09-09: 20 mL via INTRADERMAL
  Filled 2014-09-09: qty 1

## 2014-09-09 NOTE — ED Provider Notes (Signed)
CSN: [id number]     Arrival date & time 09/08/14  2151 History   First MD Initiated Contact with Patient 09/09/14 0002     Chief Complaint  Patient presents with  . Insect Bite     (Consider location/radiation/quality/duration/timing/severity/associated sxs/prior Treatment) HPI  Blood pressure 126/87, pulse 77, temperature 98.1 F (36.7 C), temperature source Oral, resp. rate 14, height 5\' 4"  (1.626 m), weight 196 lb (88.905 kg), last menstrual period 08/23/2014, SpO2 96 %.  Mary FurlongDianne Cove is a 49 y.o. female complaining of painful swelling to left ankle after she thinks she was bitten by a bug several days ago. States it's not re-painful, 3-10. She has a history of abscesses. She states that she popped the area yesterday and a little pus came out. She denies diabetes, fever, chills, nausea, vomiting. She was on a short course of steroid-induced for rash 2 weeks ago.  Past Medical History  Diagnosis Date  . Hernia   . Hepatitis C   . Drug abuse and dependence   . Depression   . H/O hiatal hernia   . Ileus, postoperative 02/13/2013   Past Surgical History  Procedure Laterality Date  . Splenectomy    . Tubal ligation    . Diagnostic laparoscopic liver biopsy N/A 02/10/2013    Procedure: DIAGNOSTIC LAPAROSCOPIC ;  Surgeon: Ardeth SportsmanSteven C. Gross, MD;  Location: [facility];  Service: General;  Laterality: N/A;  DIAGNOSTIC LAPAROSCOPY,laparoscopic ventral hernia repair with mesh lysis of adhesions   History reviewed. No pertinent family history. History  Substance Use Topics  . Smoking status: Current Every Day Smoker -- 1.00 packs/day for 20 years    Types: Cigarettes  . Smokeless tobacco: Not on file  . Alcohol Use: Yes     Comment: 1 pt daily   OB History    No data available     Review of Systems  10 systems reviewed and found to be negative, except as noted in the HPI.   Allergies  Review of patient's allergies indicates no known allergies.  Home Medications   Prior to  Admission medications   Medication Sig Start Date End Date Taking? Authorizing Provider  amoxicillin (AMOXIL) 500 MG capsule Take 500 mg by mouth 3 (three) times daily. Course is for 10 days. Started on 06-24-13 06/24/13   Historical Provider, MD  diclofenac (VOLTAREN) 50 MG EC tablet Take 50 mg by mouth 2 (two) times daily.    Historical Provider, MD  diphenhydrAMINE (BENADRYL) 25 MG tablet Take 1 tablet (25 mg total) by mouth every 8 (eight) hours as needed for itching. 08/23/14   Marissa Sciacca, PA-C  ibuprofen (ADVIL,MOTRIN) 800 MG tablet Take 1 tablet (800 mg total) by mouth 3 (three) times daily. 06/28/13   Fayrene HelperBowie Tran, PA-C  predniSONE (DELTASONE) 20 MG tablet 3 tabs po day one, then 2 tabs daily x 4 days 08/23/14   Marissa Sciacca, PA-C   BP 126/87 mmHg  Pulse 77  Temp(Src) 98.1 F (36.7 C) (Oral)  Resp 14  Ht 5\' 4"  (1.626 m)  Wt 196 lb (88.905 kg)  BMI 33.63 kg/m2  SpO2 96%  LMP 08/23/2014 Physical Exam  Constitutional: She is oriented to person, place, and time. She appears well-developed and well-nourished. No distress.  HENT:  Head: Normocephalic.  Eyes: Conjunctivae and EOM are normal.  Cardiovascular: Normal rate.   Pulmonary/Chest: Effort normal. No stridor.  Musculoskeletal: Normal range of motion.  Neurological: She is alert and oriented to person, place, and time.  Skin:  Half centimeter fluctuant abscess to left lateral ankle. There is mild surrounding cellulitis. No streaking distally neurovascularly intact, compartments soft  Psychiatric: She has a normal mood and affect.  Nursing note and vitals reviewed.   ED Course  INCISION AND DRAINAGE Date/Time: 09/09/2014 1:26 AM Performed by: Wynetta Emery Authorized by: Wynetta Emery Consent: Verbal consent obtained. Consent given by: patient Patient identity confirmed: verbally with patient Type: abscess Body area: lower extremity Anesthesia: local infiltration Local anesthetic: lidocaine 2% with  epinephrine Anesthetic total: 3 ml Patient sedated: no Scalpel size: 11 Incision type: single straight Complexity: simple Drainage: purulent Drainage amount: scant Wound treatment: wound left open Packing material: none Patient tolerance: Patient tolerated the procedure well with no immediate complications   (including critical care time) Labs Review Labs Reviewed - No data to display  Imaging Review No results found.   EKG Interpretation None      MDM   Final diagnoses:  None    Filed Vitals:   09/08/14 2159  BP: 126/87  Pulse: 77  Temp: 98.1 F (36.7 C)  TempSrc: Oral  Resp: 14  Height:  (1.626 m)  Weight: 196 lb (88.905 kg)  SpO2: 96%    Medications  sulfamethoxazole-trimethoprim (BACTRIM DS,SEPTRA DS) 800-160 MG per tablet 1 tablet (not administered)  lidocaine-EPINEPHrine (XYLOCAINE W/EPI) 2 %-1:100000 (with pres) injection 20 mL (20 mLs Intradermal Given by Other 09/09/14 0100)    Mary [name] is a pleasant 49 y.o. female presenting with low abscess to left ankle, I and D is performed. She has no signs of systemic infection. Patient lives at a homeless shelter, counseled her extensively on wound care and return precautions. Patient verbalizes her understanding. She has a small area of surrounding cellulitis. Will start on Bactrim. Advised her of lowest price options for outpatient pharmacies.  Evaluation does not show pathology that would require ongoing emergent intervention or inpatient treatment. Pt is hemodynamically stable and mentating appropriately. Discussed findings and plan with patient/guardian, who agrees with care plan. All questions answered. Return precautions discussed and outpatient follow up given.   New Prescriptions   SULFAMETHOXAZOLE-TRIMETHOPRIM (BACTRIM DS) 800-160 MG PER TABLET    Take 1 tablet by mouth 2 (two) times daily.         Wynetta Emery, PA-C 09/09/14 0128  Loren Racer, MD 09/09/14 [phone]

## 2014-09-09 NOTE — Discharge Instructions (Signed)
Wash the affected area with soap and water. Do this every 12 hours.   Do not use rubbing alcohol or hydrogen peroxide.                        Look for worsening signs of infection: if you see redness, if the area becomes warm, if pain increases sharply, there is discharge (pus), if red streaks appear or you develop fever or vomiting, RETURN immediately to the Emergency Department  for a recheck.   Do not hesitate to return to the emergency room for any new, worsening or concerning symptoms.  Please obtain primary care using resource guide below. Let them know that you were seen in the emergency room and that they will need to obtain records for further outpatient management.   Abscess Care After An abscess (also called a boil or furuncle) is an infected area that contains a collection of pus. Signs and symptoms of an abscess include pain, tenderness, redness, or hardness, or you may feel a moveable soft area under your skin. An abscess can occur anywhere in the body. The infection may spread to surrounding tissues causing cellulitis. A cut (incision) by the surgeon was made over your abscess and the pus was drained out. Gauze may have been packed into the space to provide a drain that will allow the cavity to heal from the inside outwards. The boil may be painful for 5 to 7 days. Most people with a boil do not have high fevers. Your abscess, if seen early, may not have localized, and may not have been lanced. If not, another appointment may be required for this if it does not get better on its own or with medications. HOME CARE INSTRUCTIONS   Only take over-the-counter or prescription medicines for pain, discomfort, or fever as directed by your caregiver.  When you bathe, soak and then remove gauze or iodoform packs at least daily or as directed by your caregiver. You may then wash the wound gently with mild soapy water. Repack with gauze or do as your caregiver directs. SEEK IMMEDIATE MEDICAL  CARE IF:   You develop increased pain, swelling, redness, drainage, or bleeding in the wound site.  You develop signs of generalized infection including muscle aches, chills, fever, or a general ill feeling.  An oral temperature above 102 F (38.9 C) develops, not controlled by medication. See your caregiver for a recheck if you develop any of the symptoms described above. If medications (antibiotics) were prescribed, take them as directed. Document Released: 09/15/2004 Document Revised: 05/22/2011 Document Reviewed: 05/13/2007 [facility] Patient Information 2015 [place], [place]. This information is not intended to replace advice given to you by your health care provider. Make sure you discuss any questions you have with your health care provider.   Emergency Department Resource Guide 1) Find a Doctor and Pay Out of Pocket Although you won't have to find out who is covered by your insurance plan, it is a good idea to ask around and get recommendations. You will then need to call the office and see if the doctor you have chosen will accept you as a new patient and what types of options they offer for patients who are self-pay. Some doctors offer discounts or will set up payment plans for their patients who do not have insurance, but you will need to ask so you aren't surprised when you get to your appointment.  2) Contact Your Local Health Department Not all health departments  have doctors that can see patients for sick visits, but many do, so it is worth a call to see if yours does. If you don't know where your local health department is, you can check in your phone book. The CDC also has a tool to help you locate your state's health department, and many state websites also have listings of all of their local health departments.  3) Find a Walk-in Clinic If your illness is not likely to be very severe or complicated, you may want to try a walk in clinic. These are popping up all over the country  in pharmacies, drugstores, and shopping centers. They're usually staffed by nurse practitioners or physician assistants that have been trained to treat common illnesses and complaints. They're usually fairly quick and inexpensive. However, if you have serious medical issues or chronic medical problems, these are probably not your best option.  No Primary Care Doctor: - Call Health Connect at  [phone] - they can help you locate a primary care doctor that  accepts your insurance, provides certain services, etc. - Physician Referral Service- [phone]  Chronic Pain Problems: Organization         Address  Phone   Notes  [facility]  [phone] Patients need to be referred by their primary care doctor.   Medication Assistance: Organization         Address  Phone   Notes  [facility] Medication [facility] [address] [place], [place] [address] [phone] --Must be a resident of [facility] -- Must have NO insurance coverage whatsoever (no Medicaid/ Medicare, etc.) -- The pt. MUST have a primary care doctor that directs their care regularly and follows them in the community   MedAssist  [phone]) [phone]   [employer]  [phone]) [phone]    Agencies that provide inexpensive medical care: Organization         Address  Phone   Notes  [facility]  [phone]   [facility]    [phone]   [facility] [address] [place], [place] [address] [phone]   [facility] [place] [address], [place] [phone]) [phone]   Planned Parenthood    [phone]) [phone]   [facility]    [phone]   [facility] and [facility]  [address], [place] Phone:  [phone], Fax:  [phone] Hours of Operation:  9 am - 6 pm, M-F.  Also accepts Medicaid/Medicare and self-pay.  [facility]  [address],  [place] Phone: [phone], Fax: [phone]. Hours of Operation:  8:30 am - 5:30 pm, M-F.  Also accepts Medicaid and self-pay.  [facility] [place] [address], [place] Phone: [phone]   [facility] [address], [place], [place] [phone], Ext. [phone] Mondays & Thursdays: 7-9 AM.  First 15 patients are seen on a first come, first serve basis.    Medicaid-accepting [facility] Providers:  Organization         Address  Phone   Notes  [facility] [address],  [phone]) [phone] Also accepts self-pay patients.  [facility] [address], [place]  [phone]) [phone]   [facility] [address], [place] [phone]   [facility]  [address] [place] [address], [place] [phone]   Renaye RakersVeita Bland [address], [place]   [phone] Only accepts [place] Access [place] patients after they have their name applied to their card.   Self-Pay (no insurance) in [facility] [place]:  Organization         Address  Phone   Notes  Sickle Cell Patients, [facility] [address], [place] [phone]   [facility] [address], [place] [phone]   [facility] [place]  [address], [place] [phone]) [phone]   [facility]/Dr. Osei-Bonsu  [address] [place] [address], [place] or [phone], [place] [phone] Phone number for both [place] and [place] locations is the same.  [facility] [address], [place] [phone]   [facility] [address] [place] [address], [place] or [address] [phone]   [facility] [address], [place] [phone]) [phone], phone; [phone], fax Sees patients 1st and 3rd Saturday of every month.  Must not  qualify for public or private insurance (i.e. Medicaid, Medicare, [place] Health Choice, Veterans' Benefits)  Household income should be no more than 200% of the poverty level The clinic cannot treat you if you are pregnant or think you are pregnant  Sexually transmitted diseases are not treated at the clinic.    Dental Care: Organization         Address  Phone  Notes  [facility] Department of [facility] [address], [place] [phone] Accepts children up to age 50 who are enrolled in [place]icaid or [place] Health Choice; pregnant women with a Medicaid card; and children who have applied for Medicaid or [place] Health Choice, but were declined, whose parents can pay a reduced fee at time of service.  [facility] of [facility] [place]  [address], [place] [phone] Accepts children up to age 50 who are enrolled in [place]icaid or [place] Health Choice; pregnant women with a Medicaid card; and children who have applied for Medicaid or [place] Health Choice, but were declined, whose parents can pay a reduced fee at time of service.  [facility]  [address], [place] [phone] Patients are seen by appointment only. Walk-ins are not accepted. [facility] will see patients 50 years of age and older. Monday - Tuesday (8am-5pm) Most Wednesdays (8:30-5pm) $30 per visit, cash only  [facility]  [address], [facility] [phone] Patients are seen by appointment only. Walk-ins are not accepted. [facility] will see patients 50 years of age and older. One Wednesday Evening (Monthly: Volunteer Based).  $30 per visit, cash only  [employer] [facility] [facility]  [phone] for adults; Children under age 984, call Graduate Pediatric Dentistry at [phone]. Children aged 464-14, please call [phone] to request a pediatric application.  Dental services are provided  in all areas of dental care including fillings, crowns and bridges, complete and partial dentures, implants, gum treatment, root canals, and extractions. Preventive care is also provided. Treatment is provided to both adults and children. Patients are selected via a lottery and there is often a waiting list.   [facility] [address], [place]  [phone] [email or web address]   [facility]  [address], [place], [place] [phone], Ext. [phone] Second and Fourth Thursday of each month, opens at 6:30 AM; Clinic ends at 9 AM.  Patients are seen on a first-come first-served basis, and a limited number are seen during each clinic.   [facility]  [address], [place], [place] [phone]) [phone]   Eligibility Requirements You must have lived in [place], [place], or [place] counties for at least the last three months.   You cannot be eligible for state or federal sponsored National Cityhealthcare insurance, including CIGNAVeterans Administration, [place]icaid, or [employer] EntertainmentMedicare.   You generally cannot be eligible for healthcare insurance through your employer.    How to apply: Eligibility screenings are held every Tuesday and Wednesday afternoon from 1:00 pm until 4:00 pm. You do not need an appointment for the interview!  [facility] [address], [place], [place] [phone]   [facility]  [phone]   [facility]  [phone]   [facility]  [phone]    Behavioral Health Resources in the Community: Intensive Outpatient Programs Organization         Address  Phone  Notes  [facility] [address], [place], [place] [phone]   [facility] [address], [place], [place] [phone]   [facility] [address], [place], [place]  [phone]   [facility] [address],  [place], [place]  [phone] or [phone]   Substance Abuse Resources Organization         Address  Phone  Notes  [facility]  [phone]   [facility]  [phone]   [facility]  [phone]   [facility]  [phone]   Residential & Outpatient Substance Abuse Program  [phone]   Psychological Services Organization         Address  Phone  Notes  [facility]  [phone]   [facility]  [phone]- [phone]   [facility] [address], [place] [phone] or [phone]    Mobile Crisis Teams Organization         Address  Phone  Notes  [facility], Mobile Crisis Care Unit  [phone]   [facility]  [address] [place], [place] [phone]   Doristine LocksSharon DeEsch [address] [place] [phone]    Self-Help/Support Groups Organization         Address  Phone             Notes  [facility] [place] - variety of support groups  [phone] Call for more information  Narcotics Anonymous (NA), [facility] [address], [place]  2 meetings at this location   Statisticianesidential Treatment Programs Organization         Address  Phone  Notes  [facility] [address],    [place]  [phone]   [facility]  [address], [place] [phone], [place], [place] [phone]   [facility] [address], [place] [phone] Admissions: 8am-3pm M-F  [facility] [address]    [place], [place] [phone]   [facility] [address], [place], [place] [phone]   [facility] [address]  [place], [place] [phone]   [facility] - Intensive Outpatient [phone], [place],  [place] [phone]   [facility] ([facility]iction Recovery [facility] [address]  [place], [place] [phone] or  [phone]   [facility] (RTS) [address] [place], [place] [phone] Accepts Medicaid  [facility] [address]  [place] [phone] Substance Abuse/Addiction Treatment   [facility] Organization         Address  Phone  Notes  [facility]  [phone]   Angie Fava, PhD [address] Kn[address] [place], [place]   [phone] or [phone]   [facility]   [address] [place], [place] [phone]   [facility] [address], [place], [place] [phone] Insurance/Medicaid/sponsorship through [facility] and Families [address]                                    [place], [place] [phone] Therapy/tele-psych/case  [facility] [address], [place] [phone]    Dr. Lolly Mustache  [phone]   [facility] [place]  [employer] [facility] Dept. 1) [address] [place] 2) [address] [place] 3)  [address], [place] [phone]  [phone]   [facility] Child Abuse Hotline [phone] or [phone] (After Hours)

## 2014-10-23 ENCOUNTER — Emergency Department (HOSPITAL_COMMUNITY)
Admission: EM | Admit: 2014-10-23 | Discharge: 2014-10-24 | Disposition: A | Discharge status: Home / Self Care | Payer: No Typology Code available for payment source | Attending: Emergency Medicine | Admitting: Emergency Medicine

## 2014-10-23 ENCOUNTER — Encounter (HOSPITAL_COMMUNITY): Payer: Self-pay | Admitting: *Deleted

## 2014-10-23 DIAGNOSIS — Z720 Tobacco use: Secondary | ICD-10-CM | POA: Insufficient documentation

## 2014-10-23 DIAGNOSIS — R197 Diarrhea, unspecified: Secondary | ICD-10-CM | POA: Insufficient documentation

## 2014-10-23 DIAGNOSIS — Z791 Long term (current) use of non-steroidal anti-inflammatories (NSAID): Secondary | ICD-10-CM | POA: Insufficient documentation

## 2014-10-23 DIAGNOSIS — R112 Nausea with vomiting, unspecified: Secondary | ICD-10-CM | POA: Insufficient documentation

## 2014-10-23 DIAGNOSIS — Z8619 Personal history of other infectious and parasitic diseases: Secondary | ICD-10-CM | POA: Insufficient documentation

## 2014-10-23 DIAGNOSIS — Z79899 Other long term (current) drug therapy: Secondary | ICD-10-CM | POA: Insufficient documentation

## 2014-10-23 DIAGNOSIS — F329 Major depressive disorder, single episode, unspecified: Secondary | ICD-10-CM | POA: Insufficient documentation

## 2014-10-23 DIAGNOSIS — R21 Rash and other nonspecific skin eruption: Secondary | ICD-10-CM

## 2014-10-23 MED ORDER — ONDANSETRON 4 MG PO TBDP
8.0000 mg | ORAL_TABLET | Freq: Once | ORAL | Status: AC
  Administered 2014-10-23: 8 mg via ORAL
  Filled 2014-10-23: qty 2

## 2014-10-23 NOTE — ED Notes (Signed)
Patient actively vomiting in waiting room 

## 2014-10-23 NOTE — ED Notes (Signed)
The pt is c/o a rash on her rt arm and her leg for 2 weeks she has been seen here for the same  It went away then returned.  She is now c/o nausea also

## 2014-10-24 LAB — CBC WITH DIFFERENTIAL/PLATELET
Basophils Absolute: 0 10*3/uL (ref 0.0–0.1)
Basophils Relative: 0 % (ref 0–1)
EOS ABS: 0.4 10*3/uL (ref 0.0–0.7)
EOS PCT: 2 % (ref 0–5)
HEMATOCRIT: 42.1 % (ref 36.0–46.0)
HEMOGLOBIN: 14.3 g/dL (ref 12.0–15.0)
Lymphocytes Relative: 24 % (ref 12–46)
Lymphs Abs: 3.8 10*3/uL (ref 0.7–4.0)
MCH: 31.2 pg (ref 26.0–34.0)
MCHC: 34 g/dL (ref 30.0–36.0)
MCV: 91.7 fL (ref 78.0–100.0)
MONOS PCT: 9 % (ref 3–12)
Monocytes Absolute: 1.4 10*3/uL — ABNORMAL HIGH (ref 0.1–1.0)
NEUTROS ABS: 10.5 10*3/uL — AB (ref 1.7–7.7)
Neutrophils Relative %: 65 % (ref 43–77)
PLATELETS: 292 10*3/uL (ref 150–400)
RBC: 4.59 MIL/uL (ref 3.87–5.11)
RDW: 14.3 % (ref 11.5–15.5)
WBC: 16 10*3/uL — AB (ref 4.0–10.5)

## 2014-10-24 LAB — COMPREHENSIVE METABOLIC PANEL
ALT: 19 U/L (ref 14–54)
ANION GAP: 7 (ref 5–15)
AST: 21 U/L (ref 15–41)
Albumin: 3.9 g/dL (ref 3.5–5.0)
Alkaline Phosphatase: 114 U/L (ref 38–126)
BUN: 12 mg/dL (ref 6–20)
CO2: 23 mmol/L (ref 22–32)
CREATININE: 0.74 mg/dL (ref 0.44–1.00)
Calcium: 10.7 mg/dL — ABNORMAL HIGH (ref 8.9–10.3)
Chloride: 105 mmol/L (ref 101–111)
GFR calc non Af Amer: >60 mL/min (ref >60)
Glucose, Bld: 106 mg/dL — ABNORMAL HIGH (ref 65–99)
Potassium: 4.5 mmol/L (ref 3.5–5.1)
SODIUM: 135 mmol/L (ref 135–145)
Total Bilirubin: 0.5 mg/dL (ref 0.3–1.2)
Total Protein: 7 g/dL (ref 6.5–8.1)

## 2014-10-24 MED ORDER — TRIAMCINOLONE ACETONIDE 0.1 % EX CREA: 1.0000 "application " | TOPICAL_CREAM | Freq: Two times a day (BID) | CUTANEOUS | Status: DC

## 2014-10-24 MED ORDER — ONDANSETRON HCL 4 MG/2ML IJ SOLN
4.0000 mg | Freq: Once | INTRAMUSCULAR | Status: AC
  Administered 2014-10-24: 4 mg via INTRAVENOUS
  Filled 2014-10-24: qty 2

## 2014-10-24 MED ORDER — ONDANSETRON HCL 4 MG PO TABS: 4.0000 mg | ORAL_TABLET | Freq: Four times a day (QID) | ORAL | Status: DC | PRN

## 2014-10-24 MED ORDER — SODIUM CHLORIDE 0.9 % IV SOLN
1000.0000 mL | Freq: Once | INTRAVENOUS | Status: AC
  Administered 2014-10-24: 1000 mL via INTRAVENOUS

## 2014-10-24 MED ORDER — DEXAMETHASONE SODIUM PHOSPHATE 10 MG/ML IJ SOLN
10.0000 mg | Freq: Once | INTRAMUSCULAR | Status: AC
  Administered 2014-10-24: 10 mg via INTRAVENOUS
  Filled 2014-10-24: qty 1

## 2014-10-24 MED ORDER — SODIUM CHLORIDE 0.9 % IV SOLN: 1000.0000 mL | INTRAVENOUS | Status: DC

## 2014-10-24 NOTE — ED Provider Notes (Signed)
CSN: [id number]     Arrival date & time 10/23/14  2033 History  This chart was scribed for Mary Booze, MD by Tanda Rockers, ED Scribe. This patient was seen in room D35C/D35C and the patient's care was started at 12:14 AM.  Chief Complaint  Patient presents with  . Rash   The history is provided by the patient. No language interpreter was used.     HPI Comments: Mary [name] is a 49 y.o. female who presents to the Emergency Department complaining of nausea x 3 days. Pt states that she began vomiting earlier tonight around 8:30 PM (approximately 4 hours ago). Pt mentions having diarrhea 3 days ago that has since resolved on its own. She also complains of mild chills. Pt also complains of itching rash to RUE and RLE x approximately 1 month. She has been using cortisone cream without relief. She reports being seen in the ED in the past for similar rash approximately 2 months ago. Pt was discharged home with Prednisone which relieved her symptoms until the rash returned about 1 month ago. Denies abdominal pain, constipation, fever, diaphoresis, myalgias, or any other associated symptoms.   Past Medical History  Diagnosis Date  . Hernia   . Hepatitis C   . Drug abuse and dependence   . Depression   . H/O hiatal hernia   . Ileus, postoperative 02/13/2013   Past Surgical History  Procedure Laterality Date  . Splenectomy    . Tubal ligation    . Diagnostic laparoscopic liver biopsy N/A 02/10/2013    Procedure: DIAGNOSTIC LAPAROSCOPIC ;  Surgeon: Ardeth Sportsman, MD;  Location: [facility];  Service: General;  Laterality: N/A;  DIAGNOSTIC LAPAROSCOPY,laparoscopic ventral hernia repair with mesh lysis of adhesions   No family history on file. Social History  Substance Use Topics  . Smoking status: Current Every Day Smoker -- 1.00 packs/day for 20 years    Types: Cigarettes  . Smokeless tobacco: None  . Alcohol Use: Yes     Comment: 1 pt daily   OB History    No data available     Review  of Systems  Constitutional: Positive for chills. Negative for fever and diaphoresis.  Gastrointestinal: Positive for nausea, vomiting and diarrhea. Negative for abdominal pain and constipation.  Musculoskeletal: Negative for myalgias.  Skin: Positive for rash.  All other systems reviewed and are negative.     Allergies  Review of patient's allergies indicates no known allergies.  Home Medications   Prior to Admission medications   Medication Sig Start Date End Date Taking? Authorizing Provider  amoxicillin (AMOXIL) 500 MG capsule Take 500 mg by mouth 3 (three) times daily. Course is for 10 days. Started on 06-24-13 06/24/13   Historical Provider, MD  diclofenac (VOLTAREN) 50 MG EC tablet Take 50 mg by mouth 2 (two) times daily.    Historical Provider, MD  diphenhydrAMINE (BENADRYL) 25 MG tablet Take 1 tablet (25 mg total) by mouth every 8 (eight) hours as needed for itching. 08/23/14   Marissa Sciacca, PA-C  ibuprofen (ADVIL,MOTRIN) 800 MG tablet Take 1 tablet (800 mg total) by mouth 3 (three) times daily. 06/28/13   Fayrene Helper, PA-C  predniSONE (DELTASONE) 20 MG tablet 3 tabs po day one, then 2 tabs daily x 4 days 08/23/14   Marissa Sciacca, PA-C  sulfamethoxazole-trimethoprim (BACTRIM DS) 800-160 MG per tablet Take 1 tablet by mouth 2 (two) times daily. 09/09/14   Nicole Pisciotta, PA-C   Triage Vitals: BP 113/71 mmHg  Pulse 69  Temp(Src) 98.5 F (36.9 C) (Oral)  Ht 5\' 4"  (1.626 m)  Wt 190 lb (86.183 kg)  BMI 32.60 kg/m2  SpO2 95%   Physical Exam  Constitutional: She is oriented to person, place, and time. She appears well-developed and well-nourished. No distress.  HENT:  Head: Normocephalic and atraumatic.  Eyes: Conjunctivae and EOM are normal. Pupils are equal, round, and reactive to light.  Neck: Normal range of motion. Neck supple. No JVD present.  Cardiovascular: Normal rate, regular rhythm and normal heart sounds.   No murmur heard. Pulmonary/Chest: Effort normal and  breath sounds normal. She has no wheezes. She has no rales. She exhibits no tenderness.  Abdominal: Soft. She exhibits no distension and no mass. There is no tenderness.  Bowel sounds decreased   Musculoskeletal: Normal range of motion. She exhibits no edema or tenderness.  Lymphadenopathy:    She has no cervical adenopathy.  Neurological: She is alert and oriented to person, place, and time. No cranial nerve deficit. She exhibits normal muscle tone. Coordination normal.  Skin: Skin is warm and dry. Rash noted.  Erythematous rash on the right upper arm and right thigh with excoriations present; Nonspecific in apperance  Psychiatric: She has a normal mood and affect. Her behavior is normal. Judgment and thought content normal.  Nursing note and vitals reviewed.   ED Course  Procedures (including critical care time)  DIAGNOSTIC STUDIES: Oxygen Saturation is 95% on RA, normal by my interpretation.    COORDINATION OF CARE: 12:19 AM-Discussed treatment plan which includes CBC, CMP with pt at bedside and pt agreed to plan.   Labs Review Results for orders placed or performed during the hospital encounter of 10/23/14  CBC with Differential  Result Value Ref Range   WBC 16.0 (H) 4.0 - 10.5 K/uL   RBC 4.59 3.87 - 5.11 MIL/uL   Hemoglobin 14.3 12.0 - 15.0 g/dL   HCT 16.1 09.6 - 04.5 %   MCV 91.7 78.0 - 100.0 fL   MCH 31.2 26.0 - 34.0 pg   MCHC 34.0 30.0 - 36.0 g/dL   RDW 40.9 81.1 - 91.4 %   Platelets 292 150 - 400 K/uL   Neutrophils Relative % 65 43 - 77 %   Neutro Abs 10.5 (H) 1.7 - 7.7 K/uL   Lymphocytes Relative 24 12 - 46 %   Lymphs Abs 3.8 0.7 - 4.0 K/uL   Monocytes Relative 9 3 - 12 %   Monocytes Absolute 1.4 (H) 0.1 - 1.0 K/uL   Eosinophils Relative 2 0 - 5 %   Eosinophils Absolute 0.4 0.0 - 0.7 K/uL   Basophils Relative 0 0 - 1 %   Basophils Absolute 0.0 0.0 - 0.1 K/uL  Comprehensive metabolic panel  Result Value Ref Range   Sodium 135 135 - 145 mmol/L   Potassium 4.5  3.5 - 5.1 mmol/L   Chloride 105 101 - 111 mmol/L   CO2 23 22 - 32 mmol/L   Glucose, Bld 106 (H) 65 - 99 mg/dL   BUN 12 6 - 20 mg/dL   Creatinine, Ser 7.82 0.44 - 1.00 mg/dL   Calcium 95.6 (H) 8.9 - 10.3 mg/dL   Total Protein 7.0 6.5 - 8.1 g/dL   Albumin 3.9 3.5 - 5.0 g/dL   AST 21 15 - 41 U/L   ALT 19 14 - 54 U/L   Alkaline Phosphatase 114 38 - 126 U/L   Total Bilirubin 0.5 0.3 - 1.2 mg/dL   GFR  calc non Af Amer >60 >60 mL/min   GFR calc Af Amer >60 >60 mL/min   Anion gap 7 5 - 15   I, Mary [name], personally reviewed and evaluated these lab results as part of my medical decision-making.   MDM   Final diagnoses:  Nausea and vomiting, vomiting of unspecified type  Rash    Nausea and vomiting without evidence of serious pathology. Skin rash of uncertain cause nonspecific in appearance. Old records are reviewed and she had been seen for the skin rash 2 months ago and given a short course of prednisone which barely did give temporary relief. She is given IV fluids and IV ondansetron and is feeling significantly better following this. She is given a dose of dexamethasone in the ED and is discharged with prescriptions for triamcinolone cream and ondansetron.   I personally performed the services described in this documentation, which was scribed in my presence. The recorded information has been reviewed and is accurate.       Mary Booze, MD 10/24/14 [phone]

## 2014-10-24 NOTE — ED Notes (Signed)
Signature pad in room not working.  Pt verbalized understanding of her discharge instructions and prescriptions.  All questions answered.

## 2014-10-24 NOTE — Discharge Instructions (Signed)
I am not sure what is causing your rash. Please follow up with the dermatologist.  Nausea and Vomiting Nausea is a sick feeling that often comes before throwing up (vomiting). Vomiting is a reflex where stomach contents come out of your mouth. Vomiting can cause severe loss of body fluids (dehydration). Children and elderly adults can become dehydrated quickly, especially if they also have diarrhea. Nausea and vomiting are symptoms of a condition or disease. It is important to find the cause of your symptoms. CAUSES   Direct irritation of the stomach lining. This irritation can result from increased acid production (gastroesophageal reflux disease), infection, food poisoning, taking certain medicines (such as nonsteroidal anti-inflammatory drugs), alcohol use, or tobacco use.  Signals from the brain.These signals could be caused by a headache, heat exposure, an inner ear disturbance, increased pressure in the brain from injury, infection, a tumor, or a concussion, pain, emotional stimulus, or metabolic problems.  An obstruction in the gastrointestinal tract (bowel obstruction).  Illnesses such as diabetes, hepatitis, gallbladder problems, appendicitis, kidney problems, cancer, sepsis, atypical symptoms of a heart attack, or eating disorders.  Medical treatments such as chemotherapy and radiation.  Receiving medicine that makes you sleep (general anesthetic) during surgery. DIAGNOSIS Your caregiver may ask for tests to be done if the problems do not improve after a few days. Tests may also be done if symptoms are severe or if the reason for the nausea and vomiting is not clear. Tests may include:  Urine tests.  Blood tests.  Stool tests.  Cultures (to look for evidence of infection).  X-rays or other imaging studies. Test results can help your caregiver make decisions about treatment or the need for additional tests. TREATMENT You need to stay well hydrated. Drink frequently but in  small amounts.You may wish to drink water, sports drinks, clear broth, or eat frozen ice pops or gelatin dessert to help stay hydrated.When you eat, eating slowly may help prevent nausea.There are also some antinausea medicines that may help prevent nausea. HOME CARE INSTRUCTIONS   Take all medicine as directed by your caregiver.  If you do not have an appetite, do not force yourself to eat. However, you must continue to drink fluids.  If you have an appetite, eat a normal diet unless your caregiver tells you differently.  Eat a variety of complex carbohydrates (rice, wheat, potatoes, bread), lean meats, yogurt, fruits, and vegetables.  Avoid high-fat foods because they are more difficult to digest.  Drink enough water and fluids to keep your urine clear or pale yellow.  If you are dehydrated, ask your caregiver for specific rehydration instructions. Signs of dehydration may include:  Severe thirst.  Dry lips and mouth.  Dizziness.  Dark urine.  Decreasing urine frequency and amount.  Confusion.  Rapid breathing or pulse. SEEK IMMEDIATE MEDICAL CARE IF:   You have blood or brown flecks (like coffee grounds) in your vomit.  You have black or bloody stools.  You have a severe headache or stiff neck.  You are confused.  You have severe abdominal pain.  You have chest pain or trouble breathing.  You do not urinate at least once every 8 hours.  You develop cold or clammy skin.  You continue to vomit for longer than 24 to 48 hours.  You have a fever. MAKE SURE YOU:   Understand these instructions.  Will watch your condition.  Will get help right away if you are not doing well or get worse. Document Released: 02/27/2005 Document  Revised: 05/22/2011 Document Reviewed: 07/27/2010 Exit[employer] Patient Information 2015 [place], [place]. This information is not intended to replace advice given to you by your health care provider. Make sure you discuss any questions you  have with your health care provider.  Ondansetron tablets What is this medicine? ONDANSETRON (on DAN se tron) is used to treat nausea and vomiting caused by chemotherapy. It is also used to prevent or treat nausea and vomiting after surgery. This medicine may be used for other purposes; ask your health care provider or pharmacist if you have questions. COMMON BRAND NAME(S): Zofran What should I tell my health care provider before I take this medicine? They need to know if you have any of these conditions: -heart disease -history of irregular heartbeat -liver disease -low levels of magnesium or potassium in the blood -an unusual or allergic reaction to ondansetron, granisetron, other medicines, foods, dyes, or preservatives -pregnant or trying to get pregnant -breast-feeding How should I use this medicine? Take this medicine by mouth with a glass of water. Follow the directions on your prescription label. Take your doses at regular intervals. Do not take your medicine more often than directed. Talk to your pediatrician regarding the use of this medicine in children. Special care may be needed. Overdosage: If you think you have taken too much of this medicine contact a poison control center or emergency room at once. NOTE: This medicine is only for you. Do not share this medicine with others. What if I miss a dose? If you miss a dose, take it as soon as you can. If it is almost time for your next dose, take only that dose. Do not take double or extra doses. What may interact with this medicine? Do not take this medicine with any of the following medications: -apomorphine -certain medicines for fungal infections like fluconazole, itraconazole, ketoconazole, posaconazole, voriconazole -cisapride -dofetilide -dronedarone -pimozide -thioridazine -ziprasidone This medicine may also interact with the following medications: -carbamazepine -certain medicines for depression, anxiety, or  psychotic disturbances -fentanyl -linezolid -MAOIs like Carbex, Eldepryl, Marplan, Nardil, and Parnate -methylene blue (injected into a vein) -other medicines that prolong the QT interval (cause an abnormal heart rhythm) -phenytoin -rifampicin -tramadol This list may not describe all possible interactions. Give your health care provider a list of all the medicines, herbs, non-prescription drugs, or dietary supplements you use. Also tell them if you smoke, drink alcohol, or use illegal drugs. Some items may interact with your medicine. What should I watch for while using this medicine? Check with your doctor or health care professional right away if you have any sign of an allergic reaction. What side effects may I notice from receiving this medicine? Side effects that you should report to your doctor or health care professional as soon as possible: -allergic reactions like skin rash, itching or hives, swelling of the face, lips or tongue -breathing problems -confusion -dizziness -fast or irregular heartbeat -feeling faint or lightheaded, falls -fever and chills -loss of balance or coordination -seizures -sweating -swelling of the hands or feet -tightness in the chest -tremors -unusually weak or tired Side effects that usually do not require medical attention (report to your doctor or health care professional if they continue or are bothersome): -constipation or diarrhea -headache This list may not describe all possible side effects. Call your doctor for medical advice about side effects. You may report side effects to FDA at 1-800-FDA-1088. Where should I keep my medicine? Keep out of the reach of children. Store between 2 and  30 degrees C (36 and 86 degrees F). Throw away any unused medicine after the expiration date. NOTE: This sheet is a summary. It may not cover all possible information. If you have questions about this medicine, talk to your doctor, pharmacist, or health care  provider.  2015, [employer] Standard. (2012-12-04 16:27:45)  Triamcinolone skin cream, ointment, lotion, or aerosol What is this medicine? TRIAMCINOLONE (trye am SIN oh lone) is a corticosteroid. It is used on the skin to reduce swelling, redness, itching, and allergic reactions. This medicine may be used for other purposes; ask your health care provider or pharmacist if you have questions. COMMON BRAND NAME(S): Aristocort, Aristocort A, Aristocort HP, Cinalog, Cinolar, DERMASORB TA Complete, Flutex, Kenalog, Pediaderm TA, SP Rx 228, Triacet, Trianex, Triderm What should I tell my health care provider before I take this medicine? They need to know if you have any of these conditions: -diabetes -infection, like tuberculosis, herpes, or fungal infection -large areas of burned or damaged skin -skin wasting or thinning -an unusual or allergic reaction to triamcinolone, corticosteroids, other medicines, foods, dyes, or preservatives -pregnant or trying to get pregnant -breast-feeding How should I use this medicine? This medicine is for external use only. Do not take by mouth. Follow the directions on the prescription label. Wash your hands before and after use. Apply a thin film of medicine to the affected area. Do not cover with a bandage or dressing unless your doctor or health care professional tells you to. Do not use on healthy skin or over large areas of skin. Do not get this medicine in your eyes. If you do, rinse out with plenty of cool tap water. It is important not to use more medicine than prescribed. Do not use your medicine more often than directed. Talk to your pediatrician regarding the use of this medicine in children. Special care may be needed. Elderly patients are more likely to have damaged skin through aging, and this may increase side effects. This medicine should only be used for brief periods and infrequently in older patients. Overdosage: If you think you have taken too  much of this medicine contact a poison control center or emergency room at once. NOTE: This medicine is only for you. Do not share this medicine with others. What if I miss a dose? If you miss a dose, use it as soon as you can. If it is almost time for your next dose, use only that dose. Do not use double or extra doses. What may interact with this medicine? Interactions are not expected. This list may not describe all possible interactions. Give your health care provider a list of all the medicines, herbs, non-prescription drugs, or dietary supplements you use. Also tell them if you smoke, drink alcohol, or use illegal drugs. Some items may interact with your medicine. What should I watch for while using this medicine? Tell your doctor or health care professional if your symptoms do not start to get better within one week. Do not use for more than 14 days. Do not use on healthy skin or over large areas of skin. Tell your doctor or health care professional if you are exposed to anyone with measles or chickenpox, or if you develop sores or blisters that do not heal properly. Do not use an airtight bandage to cover the affected area unless your doctor or health care professional tells you to. If you are to cover the area, follow the instructions carefully. Covering the area where the medicine is applied  can increase the amount that passes through the skin and increases the risk of side effects. If treating the diaper area of a child, avoid covering the treated area with tight-fitting diapers or plastic pants. This may increase the amount of medicine that passes through the skin and increase the risk of serious side effects. What side effects may I notice from receiving this medicine? Side effects that you should report to your doctor or health care professional as soon as possible: -burning or itching of the skin -dark red spots on the skin -infection -painful, red, pus filled blisters in hair  follicles -thinning of the skin, sunburn more likely especially on the face Side effects that usually do not require medical attention (report to your doctor or health care professional if they continue or are bothersome): -dry skin, irritation -unusual increased growth of hair on the face or body This list may not describe all possible side effects. Call your doctor for medical advice about side effects. You may report side effects to FDA at 1-800-FDA-1088. Where should I keep my medicine? Keep out of the reach of children. Store at room temperature between 15 and 30 degrees C (59 and 86 degrees F). Do not freeze. Throw away any unused medicine after the expiration date. NOTE: This sheet is a summary. It may not cover all possible information. If you have questions about this medicine, talk to your doctor, pharmacist, or health care provider.  2015, [employer] Standard. (2013-06-19 15:59:51)

## 2014-10-24 NOTE — ED Notes (Signed)
MD at bedside. 

## 2015-01-19 ENCOUNTER — Emergency Department (HOSPITAL_COMMUNITY)
Admission: EM | Admit: 2015-01-19 | Discharge: 2015-01-19 | Disposition: A | Discharge status: Home / Self Care | Payer: No Typology Code available for payment source | Attending: Emergency Medicine | Admitting: Emergency Medicine

## 2015-01-19 ENCOUNTER — Encounter (HOSPITAL_COMMUNITY): Payer: Self-pay | Admitting: Emergency Medicine

## 2015-01-19 ENCOUNTER — Emergency Department (HOSPITAL_COMMUNITY): Payer: No Typology Code available for payment source

## 2015-01-19 DIAGNOSIS — F329 Major depressive disorder, single episode, unspecified: Secondary | ICD-10-CM | POA: Insufficient documentation

## 2015-01-19 DIAGNOSIS — Z8719 Personal history of other diseases of the digestive system: Secondary | ICD-10-CM | POA: Insufficient documentation

## 2015-01-19 DIAGNOSIS — R1110 Vomiting, unspecified: Secondary | ICD-10-CM | POA: Insufficient documentation

## 2015-01-19 DIAGNOSIS — R059 Cough, unspecified: Secondary | ICD-10-CM

## 2015-01-19 DIAGNOSIS — R079 Chest pain, unspecified: Secondary | ICD-10-CM

## 2015-01-19 DIAGNOSIS — Z720 Tobacco use: Secondary | ICD-10-CM | POA: Insufficient documentation

## 2015-01-19 DIAGNOSIS — Z8619 Personal history of other infectious and parasitic diseases: Secondary | ICD-10-CM | POA: Insufficient documentation

## 2015-01-19 DIAGNOSIS — R05 Cough: Secondary | ICD-10-CM | POA: Insufficient documentation

## 2015-01-19 DIAGNOSIS — Z79899 Other long term (current) drug therapy: Secondary | ICD-10-CM | POA: Insufficient documentation

## 2015-01-19 LAB — I-STAT CHEM 8, ED
BUN: 10 mg/dL (ref 6–20)
CREATININE: 0.6 mg/dL (ref 0.44–1.00)
Calcium, Ion: 1.38 mmol/L — ABNORMAL HIGH (ref 1.12–1.23)
Chloride: 103 mmol/L (ref 101–111)
GLUCOSE: 94 mg/dL (ref 65–99)
HCT: 40 % (ref 36.0–46.0)
HEMOGLOBIN: 13.6 g/dL (ref 12.0–15.0)
POTASSIUM: 4 mmol/L (ref 3.5–5.1)
Sodium: 140 mmol/L (ref 135–145)
TCO2: 26 mmol/L (ref 0–100)

## 2015-01-19 LAB — I-STAT TROPONIN, ED: Troponin i, poc: 0 ng/mL (ref 0.00–0.08)

## 2015-01-19 MED ORDER — ALBUTEROL SULFATE HFA 108 (90 BASE) MCG/ACT IN AERS
2.0000 | INHALATION_SPRAY | Freq: Once | RESPIRATORY_TRACT | Status: AC
  Administered 2015-01-19: 2 via RESPIRATORY_TRACT
  Filled 2015-01-19: qty 6.7

## 2015-01-19 NOTE — ED Provider Notes (Signed)
CSN: [id number]     Arrival date & time 01/19/15  [id number] History  By signing my name below, I, Tanda RockersMargaux Venter, attest that this documentation has been prepared under the direction and in the presence of Zadie Rhineonald Deion Forgue, MD. Electronically Signed: Tanda RockersMargaux Venter, ED Scribe. 01/19/2015. 3:21 AM.  Chief Complaint  Patient presents with  . Chest Pain   Patient is a 49 y.o. female presenting with chest pain. The history is provided by the patient. No language interpreter was used.  Chest Pain Pain location:  Substernal area Pain radiates to:  Does not radiate Pain radiates to the back: no   Pain severity:  Unable to specify Onset quality:  Sudden Timing:  Intermittent Progression:  Partially resolved Chronicity:  Recurrent Relieved by:  Nitroglycerin and aspirin Ineffective treatments:  Antacids Associated symptoms: cough and vomiting (Self induced)   Associated symptoms: no abdominal pain, no fever and no shortness of breath      HPI Comments: Mary FurlongDianne Brackeen is a 49 y.o. female brought in by ambulance, who presents to the Emergency Department complaining of sudden onset, intermittent, mid chest pain that began earlier tonight. She also complains of cough. Pt reports similar chest pain in the past which is usually relived by GasX. Pt took GasX without relief. She also made herself vomit attempting to relieve the pain without relief. She was given 324 mg Aspirin and 1 NTG en route with relief. Pt is currently chest pain free. Denies fever, hemoptysis, shortness of breath, abdominal pain, swelling in extremities, or any other associated symptoms. No hx MI, CVA, DVT/PE. No Fhx cardiac issues.    Past Medical History  Diagnosis Date  . Hernia   . Hepatitis C   . Drug abuse and dependence (HCC)   . Depression   . H/O hiatal hernia   . Ileus, postoperative 02/13/2013   Past Surgical History  Procedure Laterality Date  . Splenectomy    . Tubal ligation    . Diagnostic laparoscopic liver  biopsy N/A 02/10/2013    Procedure: DIAGNOSTIC LAPAROSCOPIC ;  Surgeon: Ardeth SportsmanSteven C. Gross, MD;  Location: [facility];  Service: General;  Laterality: N/A;  DIAGNOSTIC LAPAROSCOPY,laparoscopic ventral hernia repair with mesh lysis of adhesions   No family history on file. Social History  Substance Use Topics  . Smoking status: Current Every Day Smoker -- 1.00 packs/day for 20 years    Types: Cigarettes  . Smokeless tobacco: None  . Alcohol Use: Yes     Comment: 1 pt daily   OB History    No data available     Review of Systems  Constitutional: Negative for fever.  Respiratory: Positive for cough. Negative for shortness of breath.        Negative for hemoptysis  Cardiovascular: Positive for chest pain. Negative for leg swelling.  Gastrointestinal: Positive for vomiting (Self induced). Negative for abdominal pain.  All other systems reviewed and are negative.  Allergies  Review of patient's allergies indicates no known allergies.  Home Medications   Prior to Admission medications   Medication Sig Start Date End Date Taking? Authorizing Provider  FLUoxetine (PROZAC) 20 MG capsule Take 20 mg by mouth 2 (two) times daily.   Yes Historical Provider, MD  QUEtiapine (SEROQUEL) 300 MG tablet Take 300 mg by mouth at bedtime.   Yes Historical Provider, MD  ondansetron (ZOFRAN) 4 MG tablet Take 1 tablet (4 mg total) by mouth every 6 (six) hours as needed for nausea or vomiting. Patient not taking: Reported on  01/19/2015 10/24/14   [name], MD  triamcinolone cream (KENALOG) 0.1 % Apply 1 application topically 2 (two) times daily. Patient not taking: Reported on 01/19/2015 10/24/14   [name], MD   Triage Vitals: BP 113/72 mmHg  Pulse 64  Temp(Src) 97.9 F (36.6 C) (Oral)  Resp 12  SpO2 97%  LMP 11/25/2014   Physical Exam  Nursing note and vitals reviewed.  CONSTITUTIONAL: Well developed/well nourished HEAD: Normocephalic/atraumatic EYES: EOMI/PERRL ENMT: Mucous membranes  moist NECK: supple no meningeal signs SPINE/BACK:entire spine nontender CV: S1/S2 noted, no murmurs/rubs/gallops noted LUNGS: Lungs are clear to auscultation bilaterally, no apparent distress, coughs frequently during exam Chest - no chest wall tenderness ABDOMEN: soft, nontender, no rebound or guarding, bowel sounds noted throughout abdomen GU:no cva tenderness NEURO: Pt is awake/alert/appropriate, moves all extremitiesx4.  No facial droop.   EXTREMITIES: pulses normal/equal, full ROM, no lower extremity edema SKIN: warm, color normal PSYCH: no abnormalities of mood noted, alert and oriented to situation  ED Course  Procedures  Medications  albuterol (PROVENTIL HFA;VENTOLIN HFA) 108 (90 BASE) MCG/ACT inhaler 2 puff (2 puffs Inhalation Given 01/19/15 0338)    DIAGNOSTIC STUDIES: Oxygen Saturation is 99% on RA, normal by my interpretation.    COORDINATION OF CARE: 3:18 AM-Discussed treatment plan which includes CXR,  Labs with pt at bedside and pt agreed to plan.  4:44 AM Pt improved She is resting comfortably She is low risk for ACS, but suggested we monitored in the ED and check repeat troponin She prefers to go home Discussed that one troponin limits full evaluation of ACS She understands this and still wants to go home Doubt PE/Dissection at this time Pt is well appearing BP 97/73 mmHg  Pulse 60  Temp(Src) 97.9 F (36.6 C) (Oral)  Resp 18  SpO2 97%  LMP 11/25/2014  Labs Review Labs Reviewed  I-STAT CHEM 8, ED - Abnormal; Notable for the following:    Calcium, Ion 1.38 (*)    All other components within normal limits  Rosezena Sensor, ED    Imaging Review Dg Chest 2 View  01/19/2015  CLINICAL DATA:  Centralized chest pain tonight. Shortness of breath. EXAM: CHEST  2 VIEW COMPARISON:  Acute abdomen series 08/23/2014, chest radiographs 06/14/2012 FINDINGS: The cardiomediastinal contours are normal. Mild chronic bronchitic change, stable from prior exams. Minimal  subsegmental atelectasis in the lingula. Pulmonary vasculature is normal. No consolidation, pleural effusion, or pneumothorax. No acute osseous abnormalities are seen. IMPRESSION: No acute pulmonary process. Electronically Signed   By: Rubye Oaks M.D.   On: 01/19/2015 03:12   I have personally reviewed and evaluated these images and lab results as part of my medical decision-making.   EKG Interpretation   Date/Time:  Tuesday January 19 2015 02:40:59 EST Ventricular Rate:  74 PR Interval:  192 QRS Duration: 80 QT Interval:  408 QTC Calculation: 452 R Axis:   77 Text Interpretation:  Normal sinus rhythm with sinus arrhythmia Normal ECG  No significant change since last tracing Confirmed by Bebe Shaggy  MD, Zakariyya Helfman  [phone]) on 01/19/2015 2:46:59 AM      MDM   Final diagnoses:  Chest pain, unspecified chest pain type  Cough   Nursing notes including past medical history and social history reviewed and considered in documentation Labs/vital reviewed myself and considered during evaluation xrays/imaging reviewed by myself and considered during evaluation  I personally performed the services described in this documentation, which was scribed in my presence. The recorded information has been reviewed and  is accurate.         Zadie Rhine, MD 01/19/15 [phone]

## 2015-01-19 NOTE — Discharge Instructions (Signed)

## 2015-01-19 NOTE — ED Notes (Signed)
Pt in EMS from home, reports intermittent CP started approx 4 hrs ago. Describes as sharp. Cough for past 2 weeks. Given 324 ASA, 1 NTG en route.

## 2015-01-24 ENCOUNTER — Emergency Department (HOSPITAL_COMMUNITY)
Admission: EM | Admit: 2015-01-24 | Discharge: 2015-01-24 | Discharge status: Against Medical Advice | Payer: No Typology Code available for payment source | Attending: Physician Assistant | Admitting: Physician Assistant

## 2015-01-24 ENCOUNTER — Encounter (HOSPITAL_COMMUNITY): Payer: Self-pay

## 2015-01-24 DIAGNOSIS — Z8619 Personal history of other infectious and parasitic diseases: Secondary | ICD-10-CM | POA: Insufficient documentation

## 2015-01-24 DIAGNOSIS — F329 Major depressive disorder, single episode, unspecified: Secondary | ICD-10-CM | POA: Insufficient documentation

## 2015-01-24 DIAGNOSIS — Z79899 Other long term (current) drug therapy: Secondary | ICD-10-CM | POA: Insufficient documentation

## 2015-01-24 DIAGNOSIS — R109 Unspecified abdominal pain: Secondary | ICD-10-CM | POA: Insufficient documentation

## 2015-01-24 DIAGNOSIS — Z9851 Tubal ligation status: Secondary | ICD-10-CM | POA: Insufficient documentation

## 2015-01-24 DIAGNOSIS — Z8719 Personal history of other diseases of the digestive system: Secondary | ICD-10-CM | POA: Insufficient documentation

## 2015-01-24 DIAGNOSIS — F17210 Nicotine dependence, cigarettes, uncomplicated: Secondary | ICD-10-CM | POA: Insufficient documentation

## 2015-01-24 NOTE — ED Notes (Signed)
Pt. Reported having nausea last night and then woke up this am with severe rt. Flank and rt. Upper and lower abdominal pain.  Denis vomiting or loose stool.  Is nauseated.  Skin is warn and dry.  Pt. Reports not being able to empty her bladder, but she denies any hematuria or dysuria.  Denies any vaginal bleeding or discharge.

## 2015-01-24 NOTE — ED Notes (Signed)
Patient advised she wants the IV out of her hand and wants to go home. Advised patient she has not seen a provider. Advised she is still ready to go. PA at bedside.

## 2015-01-24 NOTE — ED Provider Notes (Signed)
CSN: [id number]     Arrival date & time 01/24/15  1058 History   First MD Initiated Contact with Patient 01/24/15 1103     Chief Complaint  Patient presents with  . Abdominal Pain   Anayia Eugene is a 49 y.o. female with a history of hepatitis C, drug abuse and dependence, depression and a hernia who presents to the emergency department by EMS complaining of severe right flank pain starting this morning. At the time of my evaluation she reports her pain is completely resolved and is requesting to leave. She tells me she woke up with severe right flank pain this morning associated with nausea. She reports after arrival to the emergency department her pain resolved. She denies history of kidney stones. She denies fevers, chills, abdominal pain, vomiting, diarrhea, hematochezia, lightheadedness, dizziness, chest pain, shortness of breath, urinary symptoms, hematuria, rashes.  (Consider location/radiation/quality/duration/timing/severity/associated sxs/prior Treatment) HPI  Past Medical History  Diagnosis Date  . Hernia   . Hepatitis C   . Drug abuse and dependence (HCC)   . Depression   . H/O hiatal hernia   . Ileus, postoperative 02/13/2013   Past Surgical History  Procedure Laterality Date  . Splenectomy    . Tubal ligation    . Diagnostic laparoscopic liver biopsy N/A 02/10/2013    Procedure: DIAGNOSTIC LAPAROSCOPIC ;  Surgeon: Ardeth Sportsman, MD;  Location: [facility];  Service: General;  Laterality: N/A;  DIAGNOSTIC LAPAROSCOPY,laparoscopic ventral hernia repair with mesh lysis of adhesions   No family history on file. Social History  Substance Use Topics  . Smoking status: Current Every Day Smoker -- 1.00 packs/day for 20 years    Types: Cigarettes  . Smokeless tobacco: None  . Alcohol Use: Yes     Comment: 1 pt daily   OB History    No data available     Review of Systems  Constitutional: Negative for fever and chills.  HENT: Negative for congestion and sore throat.    Eyes: Negative for visual disturbance.  Respiratory: Negative for cough, shortness of breath and wheezing.   Cardiovascular: Negative for chest pain and palpitations.  Gastrointestinal: Negative for nausea, vomiting, abdominal pain and diarrhea.  Genitourinary: Positive for flank pain. Negative for dysuria, urgency, hematuria, vaginal bleeding, vaginal discharge, difficulty urinating, genital sores and pelvic pain.  Musculoskeletal: Negative for back pain and neck pain.  Skin: Negative for rash.  Neurological: Negative for weakness, light-headedness and headaches.      Allergies  Review of patient's allergies indicates no known allergies.  Home Medications   Prior to Admission medications   Medication Sig Start Date End Date Taking? Authorizing Provider  FLUoxetine (PROZAC) 20 MG capsule Take 20 mg by mouth 2 (two) times daily.    Historical Provider, MD  ondansetron (ZOFRAN) 4 MG tablet Take 1 tablet (4 mg total) by mouth every 6 (six) hours as needed for nausea or vomiting. Patient not taking: Reported on 01/19/2015 10/24/14   [name], MD  QUEtiapine (SEROQUEL) 300 MG tablet Take 300 mg by mouth at bedtime.    Historical Provider, MD  triamcinolone cream (KENALOG) 0.1 % Apply 1 application topically 2 (two) times daily. Patient not taking: Reported on 01/19/2015 10/24/14   [name], MD   BP 107/81 mmHg  Pulse 61  Temp(Src) 97.8 F (36.6 C) (Oral)  Resp 22  SpO2 100%  LMP 11/25/2014 Physical Exam  Constitutional: She is oriented to person, place, and time. She appears well-developed and well-nourished. No distress.  Nontoxic-appearing.  HENT:  Head: Normocephalic and atraumatic.  Eyes: Conjunctivae are normal. Pupils are equal, round, and reactive to light. Right eye exhibits no discharge. Left eye exhibits no discharge.  Neck: Neck supple.  Cardiovascular: Normal rate, regular rhythm, normal heart sounds and intact distal pulses.   Pulmonary/Chest: Effort normal and  breath sounds normal. No respiratory distress. She has no wheezes. She has no rales.  Abdominal: Soft. Bowel sounds are normal. She exhibits no distension. There is no tenderness. There is no guarding.  Abdomen is soft and nontender to palpation. Bowel sounds are present. No CVA or flank tenderness. No psoas or obturator sign.  Musculoskeletal: Normal range of motion. She exhibits no tenderness.  Lymphadenopathy:    She has no cervical adenopathy.  Neurological: She is alert and oriented to person, place, and time. Coordination normal.  The patient is alert night 3. She is able to ambulate with normal gait.  Skin: Skin is warm and dry. No rash noted. She is not diaphoretic. No erythema. No pallor.  Psychiatric: She has a normal mood and affect. Her behavior is normal.  Nursing note and vitals reviewed.   ED Course  Procedures (including critical care time) Labs Review None               Imaging Review No results found.    EKG Interpretation None      Filed Vitals:   01/24/15 1100 01/24/15 1115  BP: 131/103 107/81  Pulse: 61 61  Temp: 97.8 F (36.6 C)   TempSrc: Oral   Resp: 22   SpO2: 94% 100%     MDM   Final diagnoses:  Left flank pain    This is a 49 y.o. female with a history of hepatitis C, drug abuse and dependence, depression and a hernia who presents to the emergency department by EMS complaining of severe right flank pain starting this morning. At the time of my evaluation she reports her pain is completely resolved and is requesting to leave. On exam the patient is afebrile nontoxic appearing. Her abdomen is soft and nontender to palpation. She has no CVA or flank tenderness. Patient is requesting to leave. I suspect kidney stone. I advised I would like for her to have some blood work and urinalysis performed. Patient declines and states she wants to leave. I explained to her the reasons behind wanting to do testing. Patient still declines and I advised  she could leave AGAINST MEDICAL ADVICE. Patient reports she will leave AGAINST MEDICAL ADVICE.  Patient left AMA.     Everlene FarrierWilliam Kinzly Pierrelouis, PA-C 01/24/15 1205  Courteney Randall AnLyn Mackuen, MD 01/24/15 1636

## 2015-03-13 ENCOUNTER — Emergency Department (HOSPITAL_COMMUNITY): Payer: No Typology Code available for payment source

## 2015-03-13 ENCOUNTER — Encounter (HOSPITAL_COMMUNITY): Payer: Self-pay | Admitting: Emergency Medicine

## 2015-03-13 ENCOUNTER — Emergency Department (HOSPITAL_COMMUNITY)
Admission: EM | Admit: 2015-03-13 | Discharge: 2015-03-13 | Disposition: A | Discharge status: Home / Self Care | Payer: No Typology Code available for payment source | Attending: Emergency Medicine | Admitting: Emergency Medicine

## 2015-03-13 DIAGNOSIS — Z8619 Personal history of other infectious and parasitic diseases: Secondary | ICD-10-CM | POA: Insufficient documentation

## 2015-03-13 DIAGNOSIS — R0789 Other chest pain: Secondary | ICD-10-CM

## 2015-03-13 DIAGNOSIS — R1110 Vomiting, unspecified: Secondary | ICD-10-CM | POA: Insufficient documentation

## 2015-03-13 DIAGNOSIS — Z8719 Personal history of other diseases of the digestive system: Secondary | ICD-10-CM | POA: Insufficient documentation

## 2015-03-13 DIAGNOSIS — Z3202 Encounter for pregnancy test, result negative: Secondary | ICD-10-CM | POA: Insufficient documentation

## 2015-03-13 DIAGNOSIS — F209 Schizophrenia, unspecified: Secondary | ICD-10-CM | POA: Insufficient documentation

## 2015-03-13 DIAGNOSIS — Z79899 Other long term (current) drug therapy: Secondary | ICD-10-CM | POA: Insufficient documentation

## 2015-03-13 DIAGNOSIS — F329 Major depressive disorder, single episode, unspecified: Secondary | ICD-10-CM | POA: Insufficient documentation

## 2015-03-13 DIAGNOSIS — R05 Cough: Secondary | ICD-10-CM | POA: Insufficient documentation

## 2015-03-13 DIAGNOSIS — F17210 Nicotine dependence, cigarettes, uncomplicated: Secondary | ICD-10-CM | POA: Insufficient documentation

## 2015-03-13 DIAGNOSIS — F4310 Post-traumatic stress disorder, unspecified: Secondary | ICD-10-CM | POA: Insufficient documentation

## 2015-03-13 HISTORY — DX: Post-traumatic stress disorder, unspecified: F43.10

## 2015-03-13 HISTORY — DX: Borderline personality disorder: F60.3

## 2015-03-13 HISTORY — DX: Schizophrenia, unspecified: F20.9

## 2015-03-13 HISTORY — DX: Bipolar disorder, unspecified: F31.9

## 2015-03-13 LAB — COMPREHENSIVE METABOLIC PANEL
ALBUMIN: 3.7 g/dL (ref 3.5–5.0)
ALT: 18 U/L (ref 14–54)
AST: 19 U/L (ref 15–41)
Alkaline Phosphatase: 101 U/L (ref 38–126)
Anion gap: 6 (ref 5–15)
BUN: 8 mg/dL (ref 6–20)
CHLORIDE: 104 mmol/L (ref 101–111)
CO2: 29 mmol/L (ref 22–32)
CREATININE: 0.7 mg/dL (ref 0.44–1.00)
Calcium: 10.6 mg/dL — ABNORMAL HIGH (ref 8.9–10.3)
GFR calc Af Amer: >60 mL/min (ref >60)
GFR calc non Af Amer: >60 mL/min (ref >60)
GLUCOSE: 87 mg/dL (ref 65–99)
POTASSIUM: 4.2 mmol/L (ref 3.5–5.1)
Sodium: 139 mmol/L (ref 135–145)
Total Bilirubin: 0.5 mg/dL (ref 0.3–1.2)
Total Protein: 6.7 g/dL (ref 6.5–8.1)

## 2015-03-13 LAB — CBC
HEMATOCRIT: 41.1 % (ref 36.0–46.0)
Hemoglobin: 13.5 g/dL (ref 12.0–15.0)
MCH: 30.1 pg (ref 26.0–34.0)
MCHC: 32.8 g/dL (ref 30.0–36.0)
MCV: 91.5 fL (ref 78.0–100.0)
PLATELETS: 351 10*3/uL (ref 150–400)
RBC: 4.49 MIL/uL (ref 3.87–5.11)
RDW: 13.3 % (ref 11.5–15.5)
WBC: 7.4 10*3/uL (ref 4.0–10.5)

## 2015-03-13 LAB — RAPID URINE DRUG SCREEN, HOSP PERFORMED
Amphetamines: NOT DETECTED
Barbiturates: NOT DETECTED
Benzodiazepines: NOT DETECTED
Cocaine: NOT DETECTED
OPIATES: NOT DETECTED
TETRAHYDROCANNABINOL: NOT DETECTED

## 2015-03-13 LAB — URINALYSIS, ROUTINE W REFLEX MICROSCOPIC
Bilirubin Urine: NEGATIVE
GLUCOSE, UA: NEGATIVE mg/dL
HGB URINE DIPSTICK: NEGATIVE
KETONES UR: NEGATIVE mg/dL
LEUKOCYTES UA: NEGATIVE
Nitrite: NEGATIVE
PH: 6 (ref 5.0–8.0)
PROTEIN: NEGATIVE mg/dL
SPECIFIC GRAVITY, URINE: 1.022 (ref 1.005–1.030)

## 2015-03-13 LAB — LIPASE, BLOOD: Lipase: 17 U/L (ref 11–51)

## 2015-03-13 LAB — TROPONIN I

## 2015-03-13 LAB — PREGNANCY, URINE: Preg Test, Ur: NEGATIVE

## 2015-03-13 MED ORDER — SODIUM CHLORIDE 0.9 % IV BOLUS (SEPSIS)
1000.0000 mL | Freq: Once | INTRAVENOUS | Status: AC
  Administered 2015-03-13: 1000 mL via INTRAVENOUS

## 2015-03-13 MED ORDER — PANTOPRAZOLE SODIUM 40 MG PO TBEC: 40.0000 mg | DELAYED_RELEASE_TABLET | Freq: Every day | ORAL | Status: DC

## 2015-03-13 MED ORDER — FAMOTIDINE 20 MG PO TABS
20.0000 mg | ORAL_TABLET | Freq: Once | ORAL | Status: AC
  Administered 2015-03-13: 20 mg via ORAL
  Filled 2015-03-13: qty 1

## 2015-03-13 MED ORDER — ONDANSETRON HCL 4 MG/2ML IJ SOLN
4.0000 mg | Freq: Once | INTRAMUSCULAR | Status: AC
  Administered 2015-03-13: 4 mg via INTRAVENOUS
  Filled 2015-03-13: qty 2

## 2015-03-13 MED ORDER — GI COCKTAIL ~~LOC~~
30.0000 mL | Freq: Once | ORAL | Status: AC
  Administered 2015-03-13: 30 mL via ORAL
  Filled 2015-03-13: qty 30

## 2015-03-13 MED ORDER — MORPHINE SULFATE (PF) 4 MG/ML IV SOLN
4.0000 mg | Freq: Once | INTRAVENOUS | Status: AC
  Administered 2015-03-13: 4 mg via INTRAVENOUS
  Filled 2015-03-13: qty 1

## 2015-03-13 MED ORDER — SODIUM CHLORIDE 0.9 % IV SOLN: INTRAVENOUS | Status: DC

## 2015-03-13 NOTE — ED Provider Notes (Signed)
CSN: [id number]     Arrival date & time 03/13/15  1107 History   First MD Initiated Contact with Patient 03/13/15 1114     Chief Complaint  Patient presents with  . Chest Pain  . Emesis     (Consider location/radiation/quality/duration/timing/severity/associated sxs/prior Treatment) Patient is a 49 y.o. female presenting with chest pain and vomiting. The history is provided by the patient.  Chest Pain Associated symptoms: cough and vomiting   Associated symptoms: no abdominal pain, no back pain, no fever, no headache and no shortness of breath   Emesis Associated symptoms: no abdominal pain, no chills, no diarrhea, no headaches and no sore throat   Patient c/o mid chest pain for the past couple years. States would vomit, and it would go away. Now persistent pain x 2 months. Worse after eating. Associated w nv.  No bloody or bilious emesis. No back pain. No hx gallstones. +heartburn. Denies prior dx gerd. Denies hx cad. No fam hx premature. Cad. Occasional non prod cough. No leg pain or swelling. No pleuritic pain. lnmp 1 month ago, no vaginal discharge or bleeding.          Past Medical History  Diagnosis Date  . Hernia   . Hepatitis C   . Drug abuse and dependence (HCC)   . Depression   . H/O hiatal hernia   . Ileus, postoperative 02/13/2013  . Bipolar depression (HCC)   . Schizophrenia (HCC)   . Borderline personality disorder   . PTSD (post-traumatic stress disorder)    Past Surgical History  Procedure Laterality Date  . Splenectomy    . Tubal ligation    . Diagnostic laparoscopic liver biopsy N/A 02/10/2013    Procedure: DIAGNOSTIC LAPAROSCOPIC ;  Surgeon: Ardeth Sportsman, MD;  Location: [facility];  Service: General;  Laterality: N/A;  DIAGNOSTIC LAPAROSCOPY,laparoscopic ventral hernia repair with mesh lysis of adhesions  . Hernia repair     History reviewed. No pertinent family history. Social History  Substance Use Topics  . Smoking status: Current Every Day Smoker  -- 0.50 packs/day for 20 years    Types: Cigarettes  . Smokeless tobacco: None  . Alcohol Use: No     Comment: quit   OB History    No data available     Review of Systems  Constitutional: Negative for fever and chills.  HENT: Negative for sore throat.   Eyes: Negative for redness.  Respiratory: Positive for cough. Negative for shortness of breath.   Cardiovascular: Positive for chest pain.  Gastrointestinal: Positive for vomiting. Negative for abdominal pain and diarrhea.  Genitourinary: Negative for dysuria and flank pain.  Musculoskeletal: Negative for back pain and neck pain.  Skin: Negative for rash.  Neurological: Negative for headaches.  Hematological: Does not bruise/bleed easily.  Psychiatric/Behavioral: Negative for confusion.      Allergies  Review of patient's allergies indicates no known allergies.  Home Medications   Prior to Admission medications   Medication Sig Start Date End Date Taking? Authorizing Provider  FLUoxetine (PROZAC) 20 MG capsule Take 20 mg by mouth 2 (two) times daily.    Historical Provider, MD  ondansetron (ZOFRAN) 4 MG tablet Take 1 tablet (4 mg total) by mouth every 6 (six) hours as needed for nausea or vomiting. Patient not taking: Reported on 01/19/2015 10/24/14   [name], MD  QUEtiapine (SEROQUEL) 300 MG tablet Take 300 mg by mouth at bedtime.    Historical Provider, MD  triamcinolone cream (KENALOG) 0.1 % Apply  1 application topically 2 (two) times daily. Patient not taking: Reported on 01/19/2015 10/24/14   Dione Boozeavid Glick, MD   BP 105/73 mmHg  Pulse 74  Temp(Src) 97.7 F (36.5 C) (Oral)  Resp 14  SpO2 99%  LMP 02/10/2015 (Approximate) Physical Exam  Constitutional: She appears well-developed and well-nourished. No distress.  HENT:  Mouth/Throat: Oropharynx is clear and moist.  Eyes: Conjunctivae are normal. No scleral icterus.  Neck: Neck supple. No tracheal deviation present.  Cardiovascular: Normal rate, regular rhythm,  normal heart sounds and intact distal pulses.  Exam reveals no gallop and no friction rub.   No murmur heard. Pulmonary/Chest: Effort normal and breath sounds normal. No respiratory distress.  Abdominal: Soft. Normal appearance and bowel sounds are normal. She exhibits no distension and no mass. There is no tenderness. There is no rebound and no guarding.  Genitourinary:  No cva tenderness  Musculoskeletal: She exhibits no edema or tenderness.  Neurological: She is alert.  Skin: Skin is warm and dry. No rash noted. She is not diaphoretic.  Psychiatric: She has a normal mood and affect.  Nursing note and vitals reviewed.   ED Course  Procedures (including critical care time) Labs Review  Results for orders placed or performed during the hospital encounter of 03/13/15  CBC  Result Value Ref Range   WBC 7.4 4.0 - 10.5 K/uL   RBC 4.49 3.87 - 5.11 MIL/uL   Hemoglobin 13.5 12.0 - 15.0 g/dL   HCT 16.141.1 09.636.0 - 04.546.0 %   MCV 91.5 78.0 - 100.0 fL   MCH 30.1 26.0 - 34.0 pg   MCHC 32.8 30.0 - 36.0 g/dL   RDW 40.913.3 81.111.5 - 91.415.5 %   Platelets 351 150 - 400 K/uL  Comprehensive metabolic panel  Result Value Ref Range   Sodium 139 135 - 145 mmol/L   Potassium 4.2 3.5 - 5.1 mmol/L   Chloride 104 101 - 111 mmol/L   CO2 29 22 - 32 mmol/L   Glucose, Bld 87 65 - 99 mg/dL   BUN 8 6 - 20 mg/dL   Creatinine, Ser 7.820.70 0.44 - 1.00 mg/dL   Calcium 95.610.6 (H) 8.9 - 10.3 mg/dL   Total Protein 6.7 6.5 - 8.1 g/dL   Albumin 3.7 3.5 - 5.0 g/dL   AST 19 15 - 41 U/L   ALT 18 14 - 54 U/L   Alkaline Phosphatase 101 38 - 126 U/L   Total Bilirubin 0.5 0.3 - 1.2 mg/dL   GFR calc non Af Amer >60 >60 mL/min   GFR calc Af Amer >60 >60 mL/min   Anion gap 6 5 - 15  Lipase, blood  Result Value Ref Range   Lipase 17 11 - 51 U/L  Urinalysis, Routine w reflex microscopic ([facility])  Result Value Ref Range   Color, Urine YELLOW YELLOW   APPearance CLEAR CLEAR   Specific Gravity, Urine 1.022 1.005 - 1.030   pH 6.0  5.0 - 8.0   Glucose, UA NEGATIVE NEGATIVE mg/dL   Hgb urine dipstick NEGATIVE NEGATIVE   Bilirubin Urine NEGATIVE NEGATIVE   Ketones, ur NEGATIVE NEGATIVE mg/dL   Protein, ur NEGATIVE NEGATIVE mg/dL   Nitrite NEGATIVE NEGATIVE   Leukocytes, UA NEGATIVE NEGATIVE  Pregnancy, urine  Result Value Ref Range   Preg Test, Ur NEGATIVE NEGATIVE  Troponin I  Result Value Ref Range   Troponin I <0.03 <0.031 ng/mL  Urine rapid drug screen (hosp performed)  Result Value Ref Range   Opiates  NONE DETECTED NONE DETECTED   Cocaine NONE DETECTED NONE DETECTED   Benzodiazepines NONE DETECTED NONE DETECTED   Amphetamines NONE DETECTED NONE DETECTED   Tetrahydrocannabinol NONE DETECTED NONE DETECTED   Barbiturates NONE DETECTED NONE DETECTED   US Abdomen Complete  03/13/2015  CLINICAL DATA:  Initial encounter for upper abdominal pain for 2 months. Evaluate for gallstones. EXAM: ABDOMEN ULTRASOUND COMPLETE COMPARISON:  CT scan from 02/10/2013. FINDINGS: Gallbladder: No gallstones or gallbladder wall thickening. No pericholecystic fluid. The sonographer reports no sonographic Murphy's sign. Common bile duct: Diameter: 4 mm Liver: No focal lesion identified. Within normal limits in parenchymal echogenicity. IVC: No abnormality visualized. Pancreas: Obscured by overlying midline bowel gas. Spleen: Surgically absent. Right Kidney: Length: 11.9 cm. Echogenicity within normal limits. No mass or hydronephrosis visualized. 6 mm echogenic focus in the interpolar region compatible with stone. A stone was seen at this location on the previous CT scan is well. Left Kidney: Length: 11.6 cm. Echogenicity within normal limits. No mass or hydronephrosis visualized. Abdominal aorta: No aneurysm visualized. Other findings: None. IMPRESSION: No sonographic findings to explain the patient's history of pain. Electronically Signed   By: Kennith Center M.D.   On: 03/13/2015 14:43   Dg Chest Port 1 View  03/13/2015  CLINICAL DATA:   Midline chest pain x 2 months. EXAM: PORTABLE CHEST 1 VIEW COMPARISON:  01/19/2015 FINDINGS: The heart size and mediastinal contours are within normal limits. Both lungs are clear. The visualized skeletal structures are unremarkable. IMPRESSION: No active disease. Electronically Signed   By: Norva Pavlov M.D.   On: 03/13/2015 12:26      I have personally reviewed and evaluated these images and lab results as part of my medical decision-making.   EKG Interpretation   Date/Time:  Saturday March 13 2015 11:20:59 EST Ventricular Rate:  70 PR Interval:  178 QRS Duration: 87 QT Interval:  393 QTC Calculation: 424 R Axis:   73 Text Interpretation:  Sinus rhythm Nonspecific ST abnormality No  significant change since last tracing Confirmed by Tyan Lasure  MD, Caryn Bee  ([id number]) on 03/13/2015 11:41:52 AM      MDM   Iv ns. Labs. Cxr.   Reviewed nursing notes and prior charts for additional history.   Gi cocktail, pepcid po.  Recheck pain improved.  abd soft nt.  After symptoms for days, trop neg.  No gallstones on u/s.  Pt eating and drinking.  Pt currently appears stable for d/c.      Cathren Laine, MD 03/13/15 [phone]

## 2015-03-13 NOTE — ED Notes (Signed)
Pt arrives via EMS from home with ongoing chest pain for the last month. Pt reports pain became constant the last 3 days. Pt states pain is substernal, feels like tightness and she needs to burp. Pt also states has had N/V for the last 3 days. LAst BM 2 days ago. Denies fever. C/o generalized abdominal pain. States still nauseated. 324mg  ASA given by EMS, 2 SL nitros given with no change in pain. 20g LAC. Seen here recently for same, unable to followup with cardiology. Pt awake, alert, oriented x4, VSS.

## 2015-03-13 NOTE — Discharge Instructions (Signed)
It was our pleasure to provide your ER care today - we hope that you feel better.  Take protonix (acid blocker medication).  You may also try pepcid and/or maalox as need for symptom relief.  Follow up with primary care doctor in the coming week.  Given recurrent/prolonged nature of symptoms, also follow up with gi specialist in the next couple weeks - see referral - call office to arrange appointment.  Return to ER if worse, new symptoms, fevers, trouble breathing, persistent/recurrent chest pain, other concern.   You were given pain medication in the ER - no driving for the next 4 hours.      Nonspecific Chest Pain  Chest pain can be caused by many different conditions. There is always a chance that your pain could be related to something serious, such as a heart attack or a blood clot in your lungs. Chest pain can also be caused by conditions that are not life-threatening. If you have chest pain, it is very important to follow up with your health care provider. CAUSES  Chest pain can be caused by:  Heartburn.  Pneumonia or bronchitis.  Anxiety or stress.  Inflammation around your heart (pericarditis) or lung (pleuritis or pleurisy).  A blood clot in your lung.  A collapsed lung (pneumothorax). It can develop suddenly on its own (spontaneous pneumothorax) or from trauma to the chest.  Shingles infection (varicella-zoster virus).  Heart attack.  Damage to the bones, muscles, and cartilage that make up your chest wall. This can include:  Bruised bones due to injury.  Strained muscles or cartilage due to frequent or repeated coughing or overwork.  Fracture to one or more ribs.  Sore cartilage due to inflammation (costochondritis). RISK FACTORS  Risk factors for chest pain may include:  Activities that increase your risk for trauma or injury to your chest.  Respiratory infections or conditions that cause frequent coughing.  Medical conditions or overeating that can  cause heartburn.  Heart disease or family history of heart disease.  Conditions or health behaviors that increase your risk of developing a blood clot.  Having had chicken pox (varicella zoster). SIGNS AND SYMPTOMS Chest pain can feel like:  Burning or tingling on the surface of your chest or deep in your chest.  Crushing, pressure, aching, or squeezing pain.  Dull or sharp pain that is worse when you move, cough, or take a deep breath.  Pain that is also felt in your back, neck, shoulder, or arm, or pain that spreads to any of these areas. Your chest pain may come and go, or it may stay constant. DIAGNOSIS Lab tests or other studies may be needed to find the cause of your pain. Your health care provider may have you take a test called an ambulatory ECG (electrocardiogram). An ECG records your heartbeat patterns at the time the test is performed. You may also have other tests, such as:  Transthoracic echocardiogram (TTE). During echocardiography, sound waves are used to create a picture of all of the heart structures and to look at how blood flows through your heart.  Transesophageal echocardiogram (TEE).This is a more advanced imaging test that obtains images from inside your body. It allows your health care provider to see your heart in finer detail.  Cardiac monitoring. This allows your health care provider to monitor your heart rate and rhythm in real time.  Holter monitor. This is a portable device that records your heartbeat and can help to diagnose abnormal heartbeats. It allows your  health care provider to track your heart activity for several days, if needed.  Stress tests. These can be done through exercise or by taking medicine that makes your heart beat more quickly.  Blood tests.  Imaging tests. TREATMENT  Your treatment depends on what is causing your chest pain. Treatment may include:  Medicines. These may include:  Acid blockers for  heartburn.  Anti-inflammatory medicine.  Pain medicine for inflammatory conditions.  Antibiotic medicine, if an infection is present.  Medicines to dissolve blood clots.  Medicines to treat coronary artery disease.  Supportive care for conditions that do not require medicines. This may include:  Resting.  Applying heat or cold packs to injured areas.  Limiting activities until pain decreases. HOME CARE INSTRUCTIONS  If you were prescribed an antibiotic medicine, finish it all even if you start to feel better.  Avoid any activities that bring on chest pain.  Do not use any tobacco products, including cigarettes, chewing tobacco, or electronic cigarettes. If you need help quitting, ask your health care provider.  Do not drink alcohol.  Take medicines only as directed by your health care provider.  Keep all follow-up visits as directed by your health care provider. This is important. This includes any further testing if your chest pain does not go away.  If heartburn is the cause for your chest pain, you may be told to keep your head raised (elevated) while sleeping. This reduces the chance that acid will go from your stomach into your esophagus.  Make lifestyle changes as directed by your health care provider. These may include:  Getting regular exercise. Ask your health care provider to suggest some activities that are safe for you.  Eating a heart-healthy diet. A registered dietitian can help you to learn healthy eating options.  Maintaining a healthy weight.  Managing diabetes, if necessary.  Reducing stress. SEEK MEDICAL CARE IF:  Your chest pain does not go away after treatment.  You have a rash with blisters on your chest.  You have a fever. SEEK IMMEDIATE MEDICAL CARE IF:   Your chest pain is worse.  You have an increasing cough, or you cough up blood.  You have severe abdominal pain.  You have severe weakness.  You faint.  You have chills.  You  have sudden, unexplained chest discomfort.  You have sudden, unexplained discomfort in your arms, back, neck, or jaw.  You have shortness of breath at any time.  You suddenly start to sweat, or your skin gets clammy.  You feel nauseous or you vomit.  You suddenly feel light-headed or dizzy.  Your heart begins to beat quickly, or it feels like it is skipping beats. These symptoms may represent a serious problem that is an emergency. Do not wait to see if the symptoms will go away. Get medical help right away. Call your local emergency services (911 in the [place] Do not drive yourself to the hospital.   This information is not intended to replace advice given to you by your health care provider. Make sure you discuss any questions you have with your health care provider.   Document Released: 12/07/2004 Document Revised: 03/20/2014 Document Reviewed: 10/03/2013 Elsevier Interactive Patient Education 2016 [employer]    Gastroesophageal Reflux Disease, Adult Normally, food travels down the esophagus and stays in the stomach to be digested. However, when a person has gastroesophageal reflux disease (GERD), food and stomach acid move back up into the esophagus. When this happens, the esophagus becomes sore and  inflamed. Over time, GERD can create small holes (ulcers) in the lining of the esophagus.  CAUSES This condition is caused by a problem with the muscle between the esophagus and the stomach (lower esophageal sphincter, or LES). Normally, the LES muscle closes after food passes through the esophagus to the stomach. When the LES is weakened or abnormal, it does not close properly, and that allows food and stomach acid to go back up into the esophagus. The LES can be weakened by certain dietary substances, medicines, and medical conditions, including:  Tobacco use.  Pregnancy.  Having a hiatal hernia.  Heavy alcohol use.  Certain foods and beverages, such as coffee, chocolate,  onions, and peppermint. RISK FACTORS This condition is more likely to develop in:  People who have an increased body weight.  People who have connective tissue disorders.  People who use NSAID medicines. SYMPTOMS Symptoms of this condition include:  Heartburn.  Difficult or painful swallowing.  The feeling of having a lump in the throat.  Abitter taste in the mouth.  Bad breath.  Having a large amount of saliva.  Having an upset or bloated stomach.  Belching.  Chest pain.  Shortness of breath or wheezing.  Ongoing (chronic) cough or a night-time cough.  Wearing away of tooth enamel.  Weight loss. Different conditions can cause chest pain. Make sure to see your health care provider if you experience chest pain. DIAGNOSIS Your health care provider will take a medical history and perform a physical exam. To determine if you have mild or severe GERD, your health care provider may also monitor how you respond to treatment. You may also have other tests, including:  An endoscopy toexamine your stomach and esophagus with a small camera.  A test thatmeasures the acidity level in your esophagus.  A test thatmeasures how much pressure is on your esophagus.  A barium swallow or modified barium swallow to show the shape, size, and functioning of your esophagus. TREATMENT The goal of treatment is to help relieve your symptoms and to prevent complications. Treatment for this condition may vary depending on how severe your symptoms are. Your health care provider may recommend:  Changes to your diet.  Medicine.  Surgery. HOME CARE INSTRUCTIONS Diet  Follow a diet as recommended by your health care provider. This may involve avoiding foods and drinks such as:  Coffee and tea (with or without caffeine).  Drinks that containalcohol.  Energy drinks and sports drinks.  Carbonated drinks or sodas.  Chocolate and cocoa.  Peppermint and mint flavorings.  Garlic  and onions.  Horseradish.  Spicy and acidic foods, including peppers, chili powder, curry powder, vinegar, hot sauces, and barbecue sauce.  Citrus fruit juices and citrus fruits, such as oranges, lemons, and limes.  Tomato-based foods, such as red sauce, chili, salsa, and pizza with red sauce.  Fried and fatty foods, such as donuts, french fries, potato chips, and high-fat dressings.  High-fat meats, such as hot dogs and fatty cuts of red and white meats, such as rib eye steak, sausage, ham, and bacon.  High-fat dairy items, such as whole milk, butter, and cream cheese.  Eat small, frequent meals instead of large meals.  Avoid drinking large amounts of liquid with your meals.  Avoid eating meals during the 2-3 hours before bedtime.  Avoid lying down right after you eat.  Do not exercise right after you eat. General Instructions  Pay attention to any changes in your symptoms.  Take over-the-counter and prescription medicines  only as told by your health care provider. Do not take aspirin, ibuprofen, or other NSAIDs unless your health care provider told you to do so.  Do not use any tobacco products, including cigarettes, chewing tobacco, and e-cigarettes. If you need help quitting, ask your health care provider.  Wear loose-fitting clothing. Do not wear anything tight around your waist that causes pressure on your abdomen.  Raise (elevate) the head of your bed 6 inches (15cm).  Try to reduce your stress, such as with yoga or meditation. If you need help reducing stress, ask your health care provider.  If you are overweight, reduce your weight to an amount that is healthy for you. Ask your health care provider for guidance about a safe weight loss goal.  Keep all follow-up visits as told by your health care provider. This is important. SEEK MEDICAL CARE IF:  You have new symptoms.  You have unexplained weight loss.  You have difficulty swallowing, or it hurts to  swallow.  You have wheezing or a persistent cough.  Your symptoms do not improve with treatment.  You have a hoarse voice. SEEK IMMEDIATE MEDICAL CARE IF:  You have pain in your arms, neck, jaw, teeth, or back.  You feel sweaty, dizzy, or light-headed.  You have chest pain or shortness of breath.  You vomit and your vomit looks like blood or coffee grounds.  You faint.  Your stool is bloody or black.  You cannot swallow, drink, or eat.   This information is not intended to replace advice given to you by your health care provider. Make sure you discuss any questions you have with your health care provider.   Document Released: 12/07/2004 Document Revised: 11/18/2014 Document Reviewed: 06/24/2014 Elsevier Interactive Patient Education [employer]

## 2015-05-26 ENCOUNTER — Encounter (HOSPITAL_COMMUNITY): Payer: Self-pay | Admitting: Emergency Medicine

## 2015-05-26 ENCOUNTER — Emergency Department (HOSPITAL_COMMUNITY)
Admission: EM | Admit: 2015-05-26 | Discharge: 2015-05-26 | Disposition: A | Discharge status: Home / Self Care | Payer: No Typology Code available for payment source | Attending: Emergency Medicine | Admitting: Emergency Medicine

## 2015-05-26 DIAGNOSIS — F603 Borderline personality disorder: Secondary | ICD-10-CM | POA: Insufficient documentation

## 2015-05-26 DIAGNOSIS — F4310 Post-traumatic stress disorder, unspecified: Secondary | ICD-10-CM | POA: Insufficient documentation

## 2015-05-26 DIAGNOSIS — F209 Schizophrenia, unspecified: Secondary | ICD-10-CM | POA: Insufficient documentation

## 2015-05-26 DIAGNOSIS — K2100 Gastro-esophageal reflux disease with esophagitis, without bleeding: Secondary | ICD-10-CM

## 2015-05-26 DIAGNOSIS — F17210 Nicotine dependence, cigarettes, uncomplicated: Secondary | ICD-10-CM | POA: Insufficient documentation

## 2015-05-26 DIAGNOSIS — F319 Bipolar disorder, unspecified: Secondary | ICD-10-CM | POA: Insufficient documentation

## 2015-05-26 DIAGNOSIS — Z8619 Personal history of other infectious and parasitic diseases: Secondary | ICD-10-CM | POA: Insufficient documentation

## 2015-05-26 DIAGNOSIS — Z79899 Other long term (current) drug therapy: Secondary | ICD-10-CM | POA: Insufficient documentation

## 2015-05-26 MED ORDER — GI COCKTAIL ~~LOC~~
30.0000 mL | Freq: Once | ORAL | Status: AC
  Administered 2015-05-26: 30 mL via ORAL

## 2015-05-26 MED ORDER — PANTOPRAZOLE SODIUM 40 MG PO TBEC: 40.0000 mg | DELAYED_RELEASE_TABLET | Freq: Every day | ORAL | Status: DC

## 2015-05-26 NOTE — ED Notes (Signed)
Epigastric pain is intermittent.  She had relief after the GI Cocktail and now the pain has returned.  Dr. Anitra LauthPlunkett aware and is speaking with the pt. Now

## 2015-05-26 NOTE — ED Provider Notes (Addendum)
CSN: [id number]     Arrival date & time 05/26/15  0702 History   First MD Initiated Contact with Patient 05/26/15 0703     Chief Complaint  Patient presents with  . Chest Pain     (Consider location/radiation/quality/duration/timing/severity/associated sxs/prior Treatment) Patient is a 50 y.o. female presenting with chest pain. The history is provided by the patient.  Chest Pain Pain location:  Substernal area Pain quality: burning and tightness   Pain radiates to:  Does not radiate Pain radiates to the back: no   Pain severity:  Moderate Onset quality:  Gradual Timing:  Constant Progression:  Unchanged Chronicity:  Recurrent Context: eating   Context comment:  States that she was eating beef and broccoli last night and when she laid down to go to sleep started getting pressure in the chest Relieved by:  Nothing Worsened by:  Nothing tried Ineffective treatments: tried making herself burp and tried gagging herself but nothing helped. Associated symptoms: heartburn   Associated symptoms: no abdominal pain, no cough, no diaphoresis, no fever, no nausea, no palpitations, no shortness of breath, not vomiting and no weakness   Risk factors: smoking   Risk factors: no coronary artery disease, no diabetes mellitus, no high cholesterol and no hypertension     Past Medical History  Diagnosis Date  . Hernia   . Hepatitis C   . Drug abuse and dependence (HCC)   . Depression   . H/O hiatal hernia   . Ileus, postoperative 02/13/2013  . Bipolar depression (HCC)   . Schizophrenia (HCC)   . Borderline personality disorder   . PTSD (post-traumatic stress disorder)    Past Surgical History  Procedure Laterality Date  . Splenectomy    . Tubal ligation    . Diagnostic laparoscopic liver biopsy N/A 02/10/2013    Procedure: DIAGNOSTIC LAPAROSCOPIC ;  Surgeon: Ardeth SportsmanSteven C. Gross, MD;  Location: [facility];  Service: General;  Laterality: N/A;  DIAGNOSTIC LAPAROSCOPY,laparoscopic ventral hernia  repair with mesh lysis of adhesions  . Hernia repair     History reviewed. No pertinent family history. Social History  Substance Use Topics  . Smoking status: Current Every Day Smoker -- 0.50 packs/day for 20 years    Types: Cigarettes  . Smokeless tobacco: None  . Alcohol Use: No     Comment: quit   OB History    No data available     Review of Systems  Constitutional: Negative for fever and diaphoresis.  Respiratory: Negative for cough and shortness of breath.   Cardiovascular: Positive for chest pain. Negative for palpitations.  Gastrointestinal: Positive for heartburn. Negative for nausea, vomiting and abdominal pain.  Neurological: Negative for weakness.  All other systems reviewed and are negative.     Allergies  Review of patient's allergies indicates no known allergies.  Home Medications   Prior to Admission medications   Medication Sig Start Date End Date Taking? Authorizing Provider  QUEtiapine (SEROQUEL) 300 MG tablet Take 300 mg by mouth at bedtime.   Yes Historical Provider, MD  sertraline (ZOLOFT) 25 MG tablet Take 25 mg by mouth daily.   Yes Historical Provider, MD  pantoprazole (PROTONIX) 40 MG tablet Take 1 tablet (40 mg total) by mouth daily. Patient not taking: Reported on 05/26/2015 03/13/15   Cathren LaineKevin Steinl, MD   BP 121/85 mmHg  Pulse 69  Temp(Src) 97.9 F (36.6 C) (Oral)  Resp 18  Ht 5' 4.5" (1.638 m)  Wt 180 lb (81.647 kg)  BMI 30.43 kg/m2  SpO2 100%  LMP 05/23/2015 Physical Exam  Constitutional: She is oriented to person, place, and time. She appears well-developed and well-nourished. No distress.  HENT:  Head: Normocephalic and atraumatic.  Mouth/Throat: Oropharynx is clear and moist.  Eyes: Conjunctivae and EOM are normal. Pupils are equal, round, and reactive to light.  Neck: Normal range of motion. Neck supple.  Cardiovascular: Normal rate, regular rhythm and intact distal pulses.   No murmur heard. Pulmonary/Chest: Effort normal and  breath sounds normal. No respiratory distress. She has no wheezes. She has no rales. She exhibits no tenderness.  Abdominal: Soft. She exhibits no distension. There is no tenderness. There is no rebound and no guarding.  Musculoskeletal: Normal range of motion. She exhibits no edema or tenderness.  Neurological: She is alert and oriented to person, place, and time.  Skin: Skin is warm and dry. No rash noted. No erythema.  Psychiatric: She has a normal mood and affect. Her behavior is normal.  Nursing note and vitals reviewed.   ED Course  Procedures (including critical care time) Labs Review Labs Reviewed - No data to display  Imaging Review No results found. I have personally reviewed and evaluated these images and lab results as part of my medical decision-making.   EKG Interpretation   Date/Time:  Wednesday May 26 2015 07:08:18 EDT Ventricular Rate:  72 PR Interval:  189 QRS Duration: 88 QT Interval:  406 QTC Calculation: 444 R Axis:   70 Text Interpretation:  Sinus rhythm Normal ECG No significant change since  last tracing Reconfirmed by [facility]  MD, Alphonzo Lemmings ([id number]) on 05/26/2015  8:20:10 AM      MDM   Final diagnoses:  Gastroesophageal reflux disease with esophagitis    Pt with atypical story for CP most consistent with GERD.  Heart score of 1 for tobacco use.  PERC neg.  EKG nml.  Pt has had these sx multiple times in the past and started after eating beef and broccoli.  Pt was unable to afford protonix but requesting px today because now she can get it.  Pt given GI cocktail with instant relief.  She as d/ced home.   Gwyneth Sprout, MD 05/26/15 0730  Gwyneth Sprout, MD 05/26/15 6045  Gwyneth Sprout, MD 05/26/15 4098

## 2015-05-26 NOTE — ED Notes (Signed)
PER [facility]: Patient to ED from home c/o central chest pain - patient states this recurrent and she has had it before, knows it is esophageal pain - has been taking a lot of Aleve lately. Patient also c/o "gas pains" and the need to burp, but can't. VS: 120/84, HR 70 NSR, 98% RA. Given 324 ASA en route by EMS, denies pain at this time. A&O x 4. Respirations e/u.

## 2015-07-25 ENCOUNTER — Encounter (HOSPITAL_COMMUNITY): Payer: Self-pay | Admitting: *Deleted

## 2015-07-25 ENCOUNTER — Emergency Department (HOSPITAL_COMMUNITY): Payer: Self-pay

## 2015-07-25 ENCOUNTER — Emergency Department (HOSPITAL_COMMUNITY)
Admission: EM | Admit: 2015-07-25 | Discharge: 2015-07-25 | Disposition: A | Discharge status: Home / Self Care | Payer: Self-pay | Attending: Emergency Medicine | Admitting: Emergency Medicine

## 2015-07-25 DIAGNOSIS — R1032 Left lower quadrant pain: Secondary | ICD-10-CM

## 2015-07-25 DIAGNOSIS — B1920 Unspecified viral hepatitis C without hepatic coma: Secondary | ICD-10-CM

## 2015-07-25 DIAGNOSIS — Z8719 Personal history of other diseases of the digestive system: Secondary | ICD-10-CM | POA: Insufficient documentation

## 2015-07-25 DIAGNOSIS — Z3202 Encounter for pregnancy test, result negative: Secondary | ICD-10-CM | POA: Insufficient documentation

## 2015-07-25 DIAGNOSIS — Z9851 Tubal ligation status: Secondary | ICD-10-CM | POA: Insufficient documentation

## 2015-07-25 DIAGNOSIS — Z9081 Acquired absence of spleen: Secondary | ICD-10-CM | POA: Insufficient documentation

## 2015-07-25 DIAGNOSIS — F4310 Post-traumatic stress disorder, unspecified: Secondary | ICD-10-CM | POA: Insufficient documentation

## 2015-07-25 DIAGNOSIS — F603 Borderline personality disorder: Secondary | ICD-10-CM | POA: Insufficient documentation

## 2015-07-25 DIAGNOSIS — N760 Acute vaginitis: Secondary | ICD-10-CM | POA: Insufficient documentation

## 2015-07-25 DIAGNOSIS — R1031 Right lower quadrant pain: Secondary | ICD-10-CM

## 2015-07-25 DIAGNOSIS — R21 Rash and other nonspecific skin eruption: Secondary | ICD-10-CM

## 2015-07-25 DIAGNOSIS — F209 Schizophrenia, unspecified: Secondary | ICD-10-CM | POA: Insufficient documentation

## 2015-07-25 DIAGNOSIS — B9689 Other specified bacterial agents as the cause of diseases classified elsewhere: Secondary | ICD-10-CM

## 2015-07-25 DIAGNOSIS — F319 Bipolar disorder, unspecified: Secondary | ICD-10-CM | POA: Insufficient documentation

## 2015-07-25 DIAGNOSIS — F17210 Nicotine dependence, cigarettes, uncomplicated: Secondary | ICD-10-CM | POA: Insufficient documentation

## 2015-07-25 DIAGNOSIS — Z79899 Other long term (current) drug therapy: Secondary | ICD-10-CM | POA: Insufficient documentation

## 2015-07-25 LAB — CBC
HCT: 41.2 % (ref 36.0–46.0)
Hemoglobin: 13.8 g/dL (ref 12.0–15.0)
MCH: 31.2 pg (ref 26.0–34.0)
MCHC: 33.5 g/dL (ref 30.0–36.0)
MCV: 93.2 fL (ref 78.0–100.0)
PLATELETS: 325 10*3/uL (ref 150–400)
RBC: 4.42 MIL/uL (ref 3.87–5.11)
RDW: 13.6 % (ref 11.5–15.5)
WBC: 12.3 10*3/uL — AB (ref 4.0–10.5)

## 2015-07-25 LAB — COMPREHENSIVE METABOLIC PANEL
ALT: 12 U/L — AB (ref 14–54)
AST: 16 U/L (ref 15–41)
Albumin: 3.3 g/dL — ABNORMAL LOW (ref 3.5–5.0)
Alkaline Phosphatase: 98 U/L (ref 38–126)
Anion gap: 7 (ref 5–15)
BILIRUBIN TOTAL: 0.3 mg/dL (ref 0.3–1.2)
BUN: 11 mg/dL (ref 6–20)
CHLORIDE: 105 mmol/L (ref 101–111)
CO2: 25 mmol/L (ref 22–32)
CREATININE: 0.7 mg/dL (ref 0.44–1.00)
Calcium: 10.9 mg/dL — ABNORMAL HIGH (ref 8.9–10.3)
GFR calc Af Amer: >60 mL/min (ref >60)
Glucose, Bld: 96 mg/dL (ref 65–99)
Potassium: 4 mmol/L (ref 3.5–5.1)
Sodium: 137 mmol/L (ref 135–145)
Total Protein: 6.8 g/dL (ref 6.5–8.1)

## 2015-07-25 LAB — URINALYSIS, ROUTINE W REFLEX MICROSCOPIC
Bilirubin Urine: NEGATIVE
Glucose, UA: NEGATIVE mg/dL
HGB URINE DIPSTICK: NEGATIVE
Ketones, ur: NEGATIVE mg/dL
LEUKOCYTES UA: NEGATIVE
Nitrite: NEGATIVE
PROTEIN: NEGATIVE mg/dL
Specific Gravity, Urine: 1.018 (ref 1.005–1.030)
pH: 7 (ref 5.0–8.0)

## 2015-07-25 LAB — WET PREP, GENITAL
Sperm: NONE SEEN
TRICH WET PREP: NONE SEEN
WBC, Wet Prep HPF POC: NONE SEEN
Yeast Wet Prep HPF POC: NONE SEEN

## 2015-07-25 LAB — TROPONIN I: TROPONIN I: 0.03 ng/mL (ref <0.031)

## 2015-07-25 LAB — I-STAT BETA HCG BLOOD, ED (MC, WL, AP ONLY)

## 2015-07-25 LAB — LIPASE, BLOOD: LIPASE: 20 U/L (ref 11–51)

## 2015-07-25 MED ORDER — METHOCARBAMOL 500 MG PO TABS: 500.0000 mg | ORAL_TABLET | Freq: Two times a day (BID) | ORAL | Status: DC

## 2015-07-25 MED ORDER — METHOCARBAMOL 500 MG PO TABS
500.0000 mg | ORAL_TABLET | Freq: Once | ORAL | Status: AC
  Administered 2015-07-25: 500 mg via ORAL
  Filled 2015-07-25: qty 1

## 2015-07-25 MED ORDER — HYDROCORTISONE 1 % EX CREA
TOPICAL_CREAM | Freq: Once | CUTANEOUS | Status: AC
  Administered 2015-07-25: 21:00:00 via TOPICAL
  Filled 2015-07-25: qty 28

## 2015-07-25 MED ORDER — METRONIDAZOLE 500 MG PO TABS: 500.0000 mg | ORAL_TABLET | Freq: Two times a day (BID) | ORAL | Status: DC

## 2015-07-25 MED ORDER — GI COCKTAIL ~~LOC~~
30.0000 mL | Freq: Once | ORAL | Status: AC
  Administered 2015-07-25: 30 mL via ORAL
  Filled 2015-07-25: qty 30

## 2015-07-25 MED ORDER — METRONIDAZOLE 500 MG PO TABS
500.0000 mg | ORAL_TABLET | Freq: Once | ORAL | Status: AC
  Administered 2015-07-25: 500 mg via ORAL
  Filled 2015-07-25: qty 1

## 2015-07-25 NOTE — Discharge Instructions (Signed)
Do not drink alcohol while you are taking flagyl (metronidazole) because it will make you very sick.  Do not hesitate to return to the emergency room for any new, worsening or concerning symptoms.  Please obtain primary care using resource guide below. Let them know that you were seen in the emergency room and that they will need to obtain records for further outpatient management.  Return to the emergency room for severely worsening abdominal pain, abdominal pain that localizes to a particular area (especially the right lower part of the belly),  blood in stool or vomit, severe weakness, fainting, or fever.       Bacterial Vaginosis Bacterial vaginosis is a vaginal infection that occurs when the normal balance of bacteria in the vagina is disrupted. It results from an overgrowth of certain bacteria. This is the most common vaginal infection in women of childbearing age. Treatment is important to prevent complications, especially in pregnant women, as it can cause a premature delivery. CAUSES  Bacterial vaginosis is caused by an increase in harmful bacteria that are normally present in smaller amounts in the vagina. Several different kinds of bacteria can cause bacterial vaginosis. However, the reason that the condition develops is not fully understood. RISK FACTORS Certain activities or behaviors can put you at an increased risk of developing bacterial vaginosis, including:  Having a new sex partner or multiple sex partners.  Douching.  Using an intrauterine device (IUD) for contraception. Women do not get bacterial vaginosis from toilet seats, bedding, swimming pools, or contact with objects around them. SIGNS AND SYMPTOMS  Some women with bacterial vaginosis have no signs or symptoms. Common symptoms include:  Grey vaginal discharge.  A fishlike odor with discharge, especially after sexual intercourse.  Itching or burning of the vagina and vulva.  Burning or pain with  urination. DIAGNOSIS  Your health care provider will take a medical history and examine the vagina for signs of bacterial vaginosis. A sample of vaginal fluid may be taken. Your health care provider will look at this sample under a microscope to check for bacteria and abnormal cells. A vaginal pH test may also be done.  TREATMENT  Bacterial vaginosis may be treated with antibiotic medicines. These may be given in the form of a pill or a vaginal cream. A second round of antibiotics may be prescribed if the condition comes back after treatment. Because bacterial vaginosis increases your risk for sexually transmitted diseases, getting treated can help reduce your risk for chlamydia, gonorrhea, HIV, and herpes. HOME CARE INSTRUCTIONS   Only take over-the-counter or prescription medicines as directed by your health care provider.  If antibiotic medicine was prescribed, take it as directed. Make sure you finish it even if you start to feel better.  Tell all sexual partners that you have a vaginal infection. They should see their health care provider and be treated if they have problems, such as a mild rash or itching.  During treatment, it is important that you follow these instructions:  Avoid sexual activity or use condoms correctly.  Do not douche.  Avoid alcohol as directed by your health care provider.  Avoid breastfeeding as directed by your health care provider. SEEK MEDICAL CARE IF:   Your symptoms are not improving after 3 days of treatment.  You have increased discharge or pain.  You have a fever. MAKE SURE YOU:   Understand these instructions.  Will watch your condition.  Will get help right away if you are not doing well or  get worse. FOR MORE INFORMATION  Centers for Disease Control and Prevention, Division of STD Prevention: [employer]s.co.zawww.cdc.gov/std American Sexual Health Association (ASHA): www.ashastd.org    This information is not intended to replace advice given to you by  your health care provider. Make sure you discuss any questions you have with your health care provider.   Document Released: 02/27/2005 Document Revised: 03/20/2014 Document Reviewed: 10/09/2012 Elsevier Interactive Patient Education [employer]

## 2015-07-25 NOTE — ED Notes (Signed)
Pt is here with lower abdominal pain for one week and decided to come today. Pt is also concerned about a rash to her right leg

## 2015-07-25 NOTE — ED Notes (Signed)
Pt verbalized understanding of discharge instructions and follow up care

## 2015-07-25 NOTE — ED Provider Notes (Signed)
CSN: [id number]     Arrival date & time 07/25/15  1642 History   First MD Initiated Contact with Patient 07/25/15 1910     Chief Complaint  Patient presents with  . Abdominal Pain     (Consider location/radiation/quality/duration/timing/severity/associated sxs/prior Treatment) HPI   Blood pressure 109/87, pulse 90, temperature 98.9 F (37.2 C), temperature source Oral, resp. rate 18, SpO2 100 %.  Nacole Fluhr is a 50 y.o. 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 She does endorse some abnormal vaginal discharge. She also has pain in her chest which started this a.m. described as aching which she attributes to reflux, she has a prescription for Protonix which she takes with little relief.  Past Medical History  Diagnosis Date  . Hernia   . Hepatitis C   . Drug abuse and dependence (HCC)   . Depression   . H/O hiatal hernia   . Ileus, postoperative 02/13/2013  . Bipolar depression (HCC)   . Schizophrenia (HCC)   . Borderline personality disorder   . PTSD (post-traumatic stress disorder)    Past Surgical History  Procedure Laterality Date  . Splenectomy    . Tubal ligation    . Diagnostic laparoscopic liver biopsy N/A 02/10/2013    Procedure: DIAGNOSTIC LAPAROSCOPIC ;  Surgeon: Ardeth Sportsman, MD;  Location: [facility];  Service: General;  Laterality: N/A;  DIAGNOSTIC LAPAROSCOPY,laparoscopic ventral hernia repair with mesh lysis of adhesions  . Hernia repair     No family history on file. Social History  Substance Use Topics  . Smoking status: Current Every Day Smoker -- 0.50 packs/day for 20 years    Types: Cigarettes  . Smokeless tobacco: None  . Alcohol Use:  No     Comment: quit   OB History    No data available     Review of Systems  10 systems reviewed and found to be negative, except as noted in the HPI.   Allergies  Review of patient's allergies indicates no known allergies.  Home Medications   Prior to Admission medications   Medication Sig Start Date End Date Taking? Authorizing Provider  Aspirin-Acetaminophen (GOODY BODY PAIN) 500-325 MG PACK Take 1 Package by mouth daily as needed (for pain).   Yes Historical Provider, MD  naproxen sodium (ANAPROX) 220 MG tablet Take 220 mg by mouth 2 (two) times daily as needed (for pain).   Yes Historical Provider, MD  omeprazole (PRILOSEC OTC) 20 MG tablet Take 20 mg by mouth daily as needed (for heartburn).   Yes Historical Provider, MD  pantoprazole (PROTONIX) 40 MG tablet Take 1 tablet (40 mg total) by mouth daily. 05/26/15  Yes Gwyneth Sprout, MD  QUEtiapine (SEROQUEL) 300 MG tablet Take 300 mg by mouth at bedtime.   Yes Historical Provider, MD  sertraline (ZOLOFT) 25 MG tablet Take 25 mg by mouth daily.   Yes Historical Provider, MD  methocarbamol (ROBAXIN) 500 MG tablet Take 1 tablet (500 mg total) by mouth 2 (two) times daily. 07/25/15   Larenda Reedy, PA-C  metroNIDAZOLE (FLAGYL) 500 MG tablet Take 1 tablet (500 mg total) by mouth 2 (two) times daily. 07/25/15   Raeonna Milo, PA-C   BP 116/79 mmHg  Pulse  73  Temp(Src) 98.9 F (37.2 C) (Oral)  Resp 18  SpO2 100% Physical Exam  Constitutional: She is oriented to person, place, and time. She appears well-developed and well-nourished. No distress.  HENT:  Head: Normocephalic and atraumatic.  Mouth/Throat: Oropharynx is clear and moist.  Eyes: Conjunctivae and EOM are normal. Pupils are equal, round, and reactive to light.  Neck: Normal range of motion.  Cardiovascular: Normal rate, regular rhythm and intact distal pulses.   Pulmonary/Chest: Effort normal and breath sounds normal.  Abdominal: Soft. Bowel sounds are normal.  She exhibits no distension and no mass. There is no tenderness. There is no rebound and no guarding.  No Tenderness to deep palpation of any quadrant, no CVA tenderness to percussion  Genitourinary:  Pelvic exam is chaperoned by technician: No rashes or lesions, no cervical motion or adnexal tenderness.  Musculoskeletal: Normal range of motion.  Neurological: She is alert and oriented to person, place, and time.  Skin: Rash noted. She is not diaphoretic.  Excoriated rash to right shin 10 x 5 cm, no warmth, tenderness to palpation or discharge.  Psychiatric: She has a normal mood and affect.  Nursing note and vitals reviewed.   ED Course  Procedures (including critical care time) Labs Review Labs Reviewed  WET PREP, GENITAL - Abnormal; Notable for the following:    Clue Cells Wet Prep HPF POC PRESENT (*)    All other components within normal limits  COMPREHENSIVE METABOLIC PANEL - Abnormal; Notable for the following:    Calcium 10.9 (*)    Albumin 3.3 (*)    ALT 12 (*)    All other components within normal limits  CBC - Abnormal; Notable for the following:    WBC 12.3 (*)    All other components within normal limits  URINALYSIS, ROUTINE W REFLEX MICROSCOPIC (NOT AT [facility]) - Abnormal; Notable for the following:    APPearance CLOUDY (*)    All other components within normal limits  LIPASE, BLOOD  TROPONIN I  HIV ANTIBODY (ROUTINE TESTING)  RPR  I-STAT BETA HCG BLOOD, ED ([facility], [facility], [facility] ONLY)  GC/CHLAMYDIA PROBE AMP ([place]) NOT AT [facility]    Imaging Review Dg Abd Acute W/chest  07/25/2015  CLINICAL DATA:  Generalized abdominal pain for 1 week. Productive cough. Shortness of breath for 2 weeks. EXAM: DG ABDOMEN ACUTE W/ 1V CHEST COMPARISON:  None. FINDINGS: There is no evidence of dilated bowel loops or free intraperitoneal air. No radiopaque calculi or other significant radiographic abnormality is seen. Heart size and mediastinal contours are within normal limits. Both lungs are  clear. IMPRESSION: Negative abdominal radiographs.  No acute cardiopulmonary disease. Electronically Signed   By: Elige KoHetal  Patel   On: 07/25/2015 20:58   I have personally reviewed and evaluated these images and lab results as part of my medical decision-making.   EKG Interpretation   Date/Time:  Sunday Jul 25 2015 19:47:09 EDT Ventricular Rate:  69 PR Interval:  171 QRS Duration: 78 QT Interval:  382 QTC Calculation: 409 R Axis:   76 Text Interpretation:  Age not entered, assumed to be  50 years old for  purpose of ECG interpretation Sinus rhythm normal. no change from old  Confirmed by Donnald GarrePfeiffer, MD, Lebron ConnersMarcy [phone]([id number]) on 07/25/2015 8:06:13 PM      MDM   Final diagnoses:  Bilateral lower abdominal pain  Bacterial vaginosis  Rash and nonspecific skin eruption  Hepatitis C virus infection without hepatic coma, unspecified chronicity    Filed Vitals:  07/25/15 1658 07/25/15 2030  BP: 109/87 116/79  Pulse: 90 73  Temp: 98.9 F (37.2 C)   TempSrc: Oral   Resp: 18 18  SpO2: 100% 100%    Medications  methocarbamol (ROBAXIN) tablet 500 mg (not administered)  metroNIDAZOLE (FLAGYL) tablet 500 mg (not administered)  gi cocktail (Maalox,Lidocaine,Donnatal) (30 mLs Oral Given 07/25/15 1953)  hydrocortisone cream 1 % ( Topical Given 07/25/15 2037)    Akeema Broder is 50 y.o. female presenting withLower abdominal pain onset 1 week ago with associated symptoms of vaginal discharge, pelvic exam without significant abnormality, no signs of PID, wet prep shows clue cells are present. Acute abdominal series benign, abdominal exam with no tenderness palpation of any quadrant. Doubt acute pathology that would require emergent intervention. Blood work reassuring, urinalysis without signs of infection. Of note, patient has hepatitis, she is not treated for that. She will need referral to primary care, case management consulted for her to follow-up as an outpatient. Small rash with no red flags to  right lower extremity, will give her hydrocortisone ointment for that.  Evaluation does not show pathology that would require ongoing emergent intervention or inpatient treatment. Pt is hemodynamically stable and mentating appropriately. Discussed findings and plan with patient/guardian, who agrees with care plan. All questions answered. Return precautions discussed and outpatient follow up given.   New Prescriptions   METHOCARBAMOL (ROBAXIN) 500 MG TABLET    Take 1 tablet (500 mg total) by mouth 2 (two) times daily.   METRONIDAZOLE (FLAGYL) 500 MG TABLET    Take 1 tablet (500 mg total) by mouth 2 (two) times daily.         Wynetta Emery, PA-C 07/25/15 2138  Arby Barrette, MD 07/29/15 [phone]

## 2015-07-26 LAB — RPR, QUANT+TP ABS (REFLEX)
Rapid Plasma Reagin, Quant: 1:2 {titer} — ABNORMAL HIGH
TREPONEMA PALLIDUM AB: POSITIVE — AB

## 2015-07-26 LAB — GC/CHLAMYDIA PROBE AMP (~~LOC~~) NOT AT ARMC
Chlamydia: NEGATIVE
Neisseria Gonorrhea: NEGATIVE

## 2015-07-26 LAB — RPR: RPR: REACTIVE — AB

## 2015-07-26 LAB — HIV ANTIBODY (ROUTINE TESTING W REFLEX): HIV SCREEN 4TH GENERATION: NONREACTIVE

## 2015-07-27 ENCOUNTER — Telehealth (HOSPITAL_BASED_OUTPATIENT_CLINIC_OR_DEPARTMENT_OTHER): Payer: Self-pay | Admitting: Emergency Medicine

## 2015-07-27 NOTE — Telephone Encounter (Addendum)
[facility] Den[facility]Core[facility] +RPR , states no further followup needed , has hx of +RPR on 12/02/14 1:8

## 2015-09-03 ENCOUNTER — Encounter (HOSPITAL_COMMUNITY): Payer: Self-pay

## 2015-09-03 DIAGNOSIS — F17210 Nicotine dependence, cigarettes, uncomplicated: Secondary | ICD-10-CM | POA: Insufficient documentation

## 2015-09-03 DIAGNOSIS — N390 Urinary tract infection, site not specified: Secondary | ICD-10-CM | POA: Insufficient documentation

## 2015-09-03 DIAGNOSIS — Z7982 Long term (current) use of aspirin: Secondary | ICD-10-CM | POA: Insufficient documentation

## 2015-09-03 LAB — BASIC METABOLIC PANEL
ANION GAP: 5 (ref 5–15)
BUN: 13 mg/dL (ref 6–20)
CO2: 25 mmol/L (ref 22–32)
Calcium: 11 mg/dL — ABNORMAL HIGH (ref 8.9–10.3)
Chloride: 107 mmol/L (ref 101–111)
Creatinine, Ser: 0.71 mg/dL (ref 0.44–1.00)
GFR calc Af Amer: >60 mL/min (ref >60)
GFR calc non Af Amer: >60 mL/min (ref >60)
GLUCOSE: 98 mg/dL (ref 65–99)
POTASSIUM: 4 mmol/L (ref 3.5–5.1)
Sodium: 137 mmol/L (ref 135–145)

## 2015-09-03 LAB — CBC
HEMATOCRIT: 39.7 % (ref 36.0–46.0)
Hemoglobin: 13 g/dL (ref 12.0–15.0)
MCH: 29.5 pg (ref 26.0–34.0)
MCHC: 32.7 g/dL (ref 30.0–36.0)
MCV: 90.2 fL (ref 78.0–100.0)
Platelets: 383 10*3/uL (ref 150–400)
RBC: 4.4 MIL/uL (ref 3.87–5.11)
RDW: 13.1 % (ref 11.5–15.5)
WBC: 13.9 10*3/uL — AB (ref 4.0–10.5)

## 2015-09-03 LAB — URINALYSIS, ROUTINE W REFLEX MICROSCOPIC
Glucose, UA: NEGATIVE mg/dL
Ketones, ur: 15 mg/dL — AB
NITRITE: POSITIVE — AB
PH: 5 (ref 5.0–8.0)
Protein, ur: 100 mg/dL — AB
SPECIFIC GRAVITY, URINE: 1.024 (ref 1.005–1.030)

## 2015-09-03 LAB — URINE MICROSCOPIC-ADD ON

## 2015-09-03 LAB — POC URINE PREG, ED: Preg Test, Ur: NEGATIVE

## 2015-09-03 NOTE — ED Notes (Signed)
Pt states she started having UTI symptoms over 2 weeks ago and self medicated using the A to Z urinary pill OTC; pt states she got some relief from OTC but pain has increased; pt states bladder pressure at 10/10 along with urinary frequency and pain on urination. Pt a&o x 4 on arrival. Pt states dark color to urine;

## 2015-09-04 ENCOUNTER — Emergency Department (HOSPITAL_COMMUNITY)
Admission: EM | Admit: 2015-09-04 | Discharge: 2015-09-04 | Disposition: A | Discharge status: Home / Self Care | Payer: No Typology Code available for payment source | Attending: Emergency Medicine | Admitting: Emergency Medicine

## 2015-09-04 DIAGNOSIS — N390 Urinary tract infection, site not specified: Secondary | ICD-10-CM

## 2015-09-04 MED ORDER — IBUPROFEN 800 MG PO TABS
800.0000 mg | ORAL_TABLET | Freq: Once | ORAL | Status: AC
  Administered 2015-09-04: 800 mg via ORAL
  Filled 2015-09-04: qty 1

## 2015-09-04 MED ORDER — CEFTRIAXONE SODIUM 1 G IJ SOLR
1.0000 g | Freq: Once | INTRAMUSCULAR | Status: AC
  Administered 2015-09-04: 1 g via INTRAMUSCULAR
  Filled 2015-09-04: qty 10

## 2015-09-04 MED ORDER — CEPHALEXIN 500 MG PO CAPS: 500.0000 mg | ORAL_CAPSULE | Freq: Four times a day (QID) | ORAL | Status: DC

## 2015-09-04 MED ORDER — ONDANSETRON 4 MG PO TBDP: 4.0000 mg | ORAL_TABLET | Freq: Three times a day (TID) | ORAL | Status: DC | PRN

## 2015-09-04 MED ORDER — ONDANSETRON 4 MG PO TBDP
4.0000 mg | ORAL_TABLET | Freq: Once | ORAL | Status: AC
  Administered 2015-09-04: 4 mg via ORAL
  Filled 2015-09-04: qty 1

## 2015-09-04 NOTE — Discharge Instructions (Signed)
You may alternate between Tylenol 1000 mg every 6 hours as needed for fever and pain and ibuprofen 800 mg every 8 hours as needed for fever and pain. Please drink plenty of water. You may continue using Azo over-the-counter as needed for pain. Please take your antibiotics until they are completed.   Urinary Tract Infection Urinary tract infections (UTIs) can develop anywhere along your urinary tract. Your urinary tract is your body's drainage system for removing wastes and extra water. Your urinary tract includes two kidneys, two ureters, a bladder, and a urethra. Your kidneys are a pair of bean-shaped organs. Each kidney is about the size of your fist. They are located below your ribs, one on each side of your spine. CAUSES Infections are caused by microbes, which are microscopic organisms, including fungi, viruses, and bacteria. These organisms are so small that they can only be seen through a microscope. Bacteria are the microbes that most commonly cause UTIs. SYMPTOMS  Symptoms of UTIs may vary by age and gender of the patient and by the location of the infection. Symptoms in young women typically include a frequent and intense urge to urinate and a painful, burning feeling in the bladder or urethra during urination. Older women and men are more likely to be tired, shaky, and weak and have muscle aches and abdominal pain. A fever may mean the infection is in your kidneys. Other symptoms of a kidney infection include pain in your back or sides below the ribs, nausea, and vomiting. DIAGNOSIS To diagnose a UTI, your caregiver will ask you about your symptoms. Your caregiver will also ask you to provide a urine sample. The urine sample will be tested for bacteria and white blood cells. White blood cells are made by your body to help fight infection. TREATMENT  Typically, UTIs can be treated with medication. Because most UTIs are caused by a bacterial infection, they usually can be treated with the use of  antibiotics. The choice of antibiotic and length of treatment depend on your symptoms and the type of bacteria causing your infection. HOME CARE INSTRUCTIONS  If you were prescribed antibiotics, take them exactly as your caregiver instructs you. Finish the medication even if you feel better after you have only taken some of the medication.  Drink enough water and fluids to keep your urine clear or pale yellow.  Avoid caffeine, tea, and carbonated beverages. They tend to irritate your bladder.  Empty your bladder often. Avoid holding urine for long periods of time.  Empty your bladder before and after sexual intercourse.  After a bowel movement, women should cleanse from front to back. Use each tissue only once. SEEK MEDICAL CARE IF:   You have back pain.  You develop a fever.  Your symptoms do not begin to resolve within 3 days. SEEK IMMEDIATE MEDICAL CARE IF:   You have severe back pain or lower abdominal pain.  You develop chills.  You have nausea or vomiting.  You have continued burning or discomfort with urination. MAKE SURE YOU:   Understand these instructions.  Will watch your condition.  Will get help right away if you are not doing well or get worse.   This information is not intended to replace advice given to you by your health care provider. Make sure you discuss any questions you have with your health care provider.   Document Released: 12/07/2004 Document Revised: 11/18/2014 Document Reviewed: 04/07/2011 Elsevier Interactive Patient Education [employer]2016 Elsevier Inc.   To find a primary care  or specialty doctor please call [phone] or [phone] to access "[place] Find a Doctor Service."  You may also go on the [facility] website at [email or web address]  There are also multiple [facility] and [facility] practices throughout the [place] that are frequently accepting new patients. You may find a clinic that is close to your home and  contact them.  [facility] -  [address] [place] [id number] [phone]  [facility] in [place] (also locations in [place] and [place]) -  [address] [place] [address] [phone]  [facility] -  [address] [place] [address] [phone]

## 2015-09-04 NOTE — ED Notes (Signed)
Pt stable, ambulatory, states understanding of discharge instructions 

## 2015-09-04 NOTE — ED Provider Notes (Signed)
By signing my name below, I, Rosario AdieWilliam Andrew Hiatt, attest that this documentation has been prepared under the direction and in the presence of Kristen N Ward, DO.  Electronically Signed: Rosario AdieWilliam Andrew Hiatt, ED Scribe. 09/04/2015. 4:01 AM.  TIME SEEN: 3:55am  CHIEF COMPLAINT: dysuria  HPI: HPI Comments: Mary [name] is a 50 y.o. female with a PMHx of Hepatitis C, and cocaine abuse who presents to the Emergency Department complaining of gradual onset, gradually worsening, intermittent, 10/10 dysuria and bilateral lower back pain onset 2 weeks ago. Pt has associated frequency and nausea. Pt reports that her pain is worsened with urination. She has taken AZO medication with some relief of her pain, but it has come back stronger since taking it. She has has one episode of a UTI in the past, and reports that her pain today is similar to her last episode. She has had a Splenectomy and hernia repair. Pt denies fever, chills, diarrhea, hematuria, vaginal bleeding, or vaginal discharge. LNMP was ~1 month ago. She has been sober for 3 years.  ROS: See HPI Constitutional: no fever  Eyes: no drainage  ENT: no runny nose   Cardiovascular:  no chest pain  Resp: no SOB  GI: no vomiting GU: dysuria Integumentary: no rash  Allergy: no hives  Musculoskeletal: no leg swelling  Neurological: no slurred speech ROS otherwise negative  PAST MEDICAL HISTORY/PAST SURGICAL HISTORY:  Past Medical History  Diagnosis Date  . Hernia   . Hepatitis C   . Drug abuse and dependence (HCC)   . Depression   . H/O hiatal hernia   . Ileus, postoperative 02/13/2013  . Bipolar depression (HCC)   . Schizophrenia (HCC)   . Borderline personality disorder   . PTSD (post-traumatic stress disorder)     MEDICATIONS:  Prior to Admission medications   Medication Sig Start Date End Date Taking? Authorizing Provider  Aspirin-Acetaminophen (GOODY BODY PAIN) 500-325 MG PACK Take 1 Package by mouth daily as needed (for  pain).    Historical Provider, MD  methocarbamol (ROBAXIN) 500 MG tablet Take 1 tablet (500 mg total) by mouth 2 (two) times daily. 07/25/15   Nicole Pisciotta, PA-C  metroNIDAZOLE (FLAGYL) 500 MG tablet Take 1 tablet (500 mg total) by mouth 2 (two) times daily. 07/25/15   Nicole Pisciotta, PA-C  naproxen sodium (ANAPROX) 220 MG tablet Take 220 mg by mouth 2 (two) times daily as needed (for pain).    Historical Provider, MD  omeprazole (PRILOSEC OTC) 20 MG tablet Take 20 mg by mouth daily as needed (for heartburn).    Historical Provider, MD  pantoprazole (PROTONIX) 40 MG tablet Take 1 tablet (40 mg total) by mouth daily. 05/26/15   Gwyneth SproutWhitney Plunkett, MD  QUEtiapine (SEROQUEL) 300 MG tablet Take 300 mg by mouth at bedtime.    Historical Provider, MD  sertraline (ZOLOFT) 25 MG tablet Take 25 mg by mouth daily.    Historical Provider, MD    ALLERGIES:  No Known Allergies  SOCIAL HISTORY:  Social History  Substance Use Topics  . Smoking status: Current Every Day Smoker -- 0.50 packs/day for 20 years    Types: Cigarettes  . Smokeless tobacco: Not on file  . Alcohol Use: No     Comment: quit    FAMILY HISTORY: No family history on file.  EXAM: BP 112/84 mmHg  Pulse 88  Temp(Src) 98 F (36.7 C) (Oral)  Resp 18  Ht 5\' 4"  (1.626 m)  Wt 187 lb 6.3 oz (85 kg)  BMI 32.15 kg/m2  SpO2 99%  LMP 07/28/2015 (Approximate) CONSTITUTIONAL: Alert and oriented and responds appropriately to questions. Well-appearing; well-nourished, Afebrile, nontoxic, smiling, pleasant HEAD: Normocephalic EYES: Conjunctivae clear, PERRL ENT: normal nose; no rhinorrhea; moist mucous membranes NECK: Supple, no meningismus, no LAD  CARD: RRR; S1 and S2 appreciated; no murmurs, no clicks, no rubs, no gallops RESP: Normal chest excursion without splinting or tachypnea; breath sounds clear and equal bilaterally; no wheezes, no rhonchi, no rales, no hypoxia or respiratory distress, speaking full sentences ABD/GI: Normal  bowel sounds; non-distended; soft, non-tender, no rebound, no guarding, no peritoneal signs BACK:  The back appears normal and is non-tender to palpation, there is no CVA tenderness, no midline spinal tenderness or step-off or deformity EXT: Normal ROM in all joints; non-tender to palpation; no edema; normal capillary refill; no cyanosis, no calf tenderness or swelling    SKIN: Normal color for age and race; warm; no rash NEURO: Moves all extremities equally, sensation to light touch intact diffusely, cranial nerves II through XII intact, normal gait PSYCH: The patient's mood and manner are appropriate. Grooming and personal hygiene are appropriate.  MEDICAL DECISION MAKING: Patient here with symptoms of urinary tract infection. She is not pregnant. She does appear to have a urinary tract infection based on her urinalysis. Cultures pending. Labs show leukocytosis but otherwise unremarkable. Creatinine is normal. No history of kidney stones or pyelonephritis. She has no flank pain on exam. Will give dose of IM ceftriaxone in the emergency department and discharge on Keflex. States she's had one UTI in the past many years ago and she does not remember what antibiotic has helped her the most. No drug allergies. Have recommend alternating Tylenol and Motrin for fever, pain. We'll discharge with Phenergan for nausea. Discussed return precautions. She verbalized understanding and is comfortable with this plan. We'll provide her with outpatient PCP information as well. Given work note per patient request.   At this time, I do not feel there is any life-threatening condition present. I have reviewed and discussed all results (EKG, imaging, lab, urine as appropriate), exam findings with patient. I have reviewed nursing notes and appropriate previous records.  I feel the patient is safe to be discharged home without further emergent workup. Discussed usual and customary return precautions. Patient and family (if  present) verbalize understanding and are comfortable with this plan.  Patient will follow-up with their primary care provider. If they do not have a primary care provider, information for follow-up has been provided to them. All questions have been answered.     This chart was scribed in my presence and reviewed by me personally.   Layla MawKristen N Ward, DO 09/04/15 [phone]

## 2015-09-06 LAB — URINE CULTURE: Culture: >=100000 — AB

## 2015-09-07 ENCOUNTER — Telehealth (HOSPITAL_BASED_OUTPATIENT_CLINIC_OR_DEPARTMENT_OTHER): Payer: Self-pay | Admitting: Emergency Medicine

## 2015-09-07 NOTE — Telephone Encounter (Signed)
Post ED Visit - Positive Culture Follow-up  Culture report reviewed by antimicrobial stewardship pharmacist:  []  Enzo BiNathan Batchelder, Pharm.D. []  Celedonio MiyamotoJeremy Frens, Pharm.D., BCPS []  Garvin FilaMike Maccia, Pharm.D. []  Georgina PillionElizabeth Martin, Pharm.D., BCPS []  WorcesterMinh Pham, [address]Ph[address]D., BCPS, AAHIVP []  Estella HuskMichelle Turner, Pharm.D., BCPS, AAHIVP [x]  Tennis Mustassie Stewart, Pharm.D. []  Rob Oswaldo DoneVincent, [address]  Positive urine  culture Treated with cephalexin, organism sensitive to the same and no further patient follow-up is required at this time.  Mary [name], Mary [name] 09/07/2015, 1:20 PM

## 2016-01-11 ENCOUNTER — Ambulatory Visit (HOSPITAL_COMMUNITY)
Admission: EM | Admit: 2016-01-11 | Discharge: 2016-01-11 | Disposition: A | Discharge status: Home / Self Care | Payer: No Typology Code available for payment source | Attending: Family Medicine | Admitting: Family Medicine

## 2016-01-11 ENCOUNTER — Encounter (HOSPITAL_COMMUNITY): Payer: Self-pay | Admitting: *Deleted

## 2016-01-11 DIAGNOSIS — R51 Headache: Secondary | ICD-10-CM

## 2016-01-11 DIAGNOSIS — R519 Headache, unspecified: Secondary | ICD-10-CM

## 2016-01-11 DIAGNOSIS — J069 Acute upper respiratory infection, unspecified: Secondary | ICD-10-CM

## 2016-01-11 LAB — POCT URINALYSIS DIP (DEVICE)
BILIRUBIN URINE: NEGATIVE
Glucose, UA: NEGATIVE mg/dL
Ketones, ur: NEGATIVE mg/dL
LEUKOCYTES UA: NEGATIVE
NITRITE: NEGATIVE
Protein, ur: NEGATIVE mg/dL
SPECIFIC GRAVITY, URINE: 1.025 (ref 1.005–1.030)
Urobilinogen, UA: 1 mg/dL (ref 0.0–1.0)
pH: 7 (ref 5.0–8.0)

## 2016-01-11 MED ORDER — IPRATROPIUM BROMIDE 0.06 % NA SOLN: 2.0000 | Freq: Four times a day (QID) | NASAL | 1 refills | Status: DC

## 2016-01-11 MED ORDER — DOXYCYCLINE HYCLATE 100 MG PO CAPS: 100.0000 mg | ORAL_CAPSULE | Freq: Two times a day (BID) | ORAL | 0 refills | Status: DC

## 2016-01-11 NOTE — ED Provider Notes (Signed)
[facility]    CSN: [id number] Arrival date & time: 01/11/16  1517     History   Chief Complaint Chief Complaint  Patient presents with  . Headache    HPI Mary [name] is a 50 y.o. female.   The history is provided by the patient.  Headache  Pain location:  Frontal Quality:  Dull Radiates to:  Does not radiate Duration:  1 week Progression:  Unchanged Chronicity:  New Similar to prior headaches: no   Relieved by:  None tried Worsened by:  Nothing Ineffective treatments:  None tried Associated symptoms: congestion, cough, drainage and facial pain   Associated symptoms: no fever     Past Medical History:  Diagnosis Date  . Bipolar depression (HCC)   . Borderline personality disorder   . Depression   . Drug abuse and dependence (HCC)   . H/O hiatal hernia   . Hepatitis C   . Hernia   . Ileus, postoperative (HCC) 02/13/2013  . PTSD (post-traumatic stress disorder)   . Schizophrenia [facility])     Patient Active Problem List   Diagnosis Date Noted  . Ileus, postoperative (HCC) 02/13/2013  . Hepatitis C   . Drug abuse and dependence (HCC)   . Incarcerated incisional hernia s/p lap repair 02/10/2013 02/10/2013  . Polysubstance dependence (HCC) 10/16/2011  . Substance induced mood disorder (HCC) 10/16/2011  . GERD without esophagitis 07/04/2011  . BV (bacterial vaginosis) 06/04/2011    Past Surgical History:  Procedure Laterality Date  . DIAGNOSTIC LAPAROSCOPIC LIVER BIOPSY N/A 02/10/2013   Procedure: DIAGNOSTIC LAPAROSCOPIC ;  Surgeon: Ardeth Sportsman, MD;  Location: [facility];  Service: General;  Laterality: N/A;  DIAGNOSTIC LAPAROSCOPY,laparoscopic ventral hernia repair with mesh lysis of adhesions  . HERNIA REPAIR    . SPLENECTOMY    . TUBAL LIGATION      OB History    No data available       Home Medications    Prior to Admission medications   Medication Sig Start Date End Date Taking? Authorizing Provider  Aspirin-Acetaminophen (GOODY  BODY PAIN) 500-325 MG PACK Take 1 Package by mouth daily as needed (for pain).    Historical Provider, MD  cephALEXin (KEFLEX) 500 MG capsule Take 1 capsule (500 mg total) by mouth 4 (four) times daily. 09/04/15   Kristen N Ward, DO  doxycycline (VIBRAMYCIN) 100 MG capsule Take 1 capsule (100 mg total) by mouth 2 (two) times daily. 01/11/16   Linna Hoff, MD  ipratropium (ATROVENT) 0.06 % nasal spray Place 2 sprays into both nostrils 4 (four) times daily. 01/11/16   Linna Hoff, MD  methocarbamol (ROBAXIN) 500 MG tablet Take 1 tablet (500 mg total) by mouth 2 (two) times daily. 07/25/15   Nicole Pisciotta, PA-C  metroNIDAZOLE (FLAGYL) 500 MG tablet Take 1 tablet (500 mg total) by mouth 2 (two) times daily. 07/25/15   Nicole Pisciotta, PA-C  naproxen sodium (ANAPROX) 220 MG tablet Take 220 mg by mouth 2 (two) times daily as needed (for pain).    Historical Provider, MD  omeprazole (PRILOSEC OTC) 20 MG tablet Take 20 mg by mouth daily as needed (for heartburn).    Historical Provider, MD  ondansetron (ZOFRAN ODT) 4 MG disintegrating tablet Take 1 tablet (4 mg total) by mouth every 8 (eight) hours as needed for nausea or vomiting. 09/04/15   Kristen N Ward, DO  pantoprazole (PROTONIX) 40 MG tablet Take 1 tablet (40 mg total) by mouth daily. 05/26/15  Gwyneth SproutWhitney Plunkett, MD  QUEtiapine (SEROQUEL) 300 MG tablet Take 300 mg by mouth at bedtime.    Historical Provider, MD  sertraline (ZOLOFT) 25 MG tablet Take 25 mg by mouth daily.    Historical Provider, MD    Family History History reviewed. No pertinent family history.  Social History Social History  Substance Use Topics  . Smoking status: Current Every Day Smoker    Packs/day: 0.50    Years: 20.00    Types: Cigarettes  . Smokeless tobacco: Not on file  . Alcohol use No     Comment: quit     Allergies   Review of patient's allergies indicates no known allergies.   Review of Systems Review of Systems  Constitutional: Negative.  Negative  for fever.  HENT: Positive for congestion, postnasal drip and rhinorrhea.   Respiratory: Positive for cough. Negative for shortness of breath and wheezing.   Cardiovascular: Negative.   Neurological: Positive for headaches.  All other systems reviewed and are negative.    Physical Exam Triage Vital Signs ED Triage Vitals  Enc Vitals Group     BP 01/11/16 1541 122/86     Pulse Rate 01/11/16 1541 82     Resp 01/11/16 1541 14     Temp 01/11/16 1541 98.2 F (36.8 C)     Temp Source 01/11/16 1541 Oral     SpO2 01/11/16 1541 98 %     Weight --      Height --      Head Circumference --      Peak Flow --      Pain Score 01/11/16 1558 6     Pain Loc --      Pain Edu? --      Excl. in GC? --    No data found.   Updated Vital Signs BP 122/86 (BP Location: Left Arm)   Pulse 82   Temp 98.2 F (36.8 C) (Oral)   Resp 14   SpO2 98%   Visual Acuity Right Eye Distance:   Left Eye Distance:   Bilateral Distance:    Right Eye Near:   Left Eye Near:    Bilateral Near:     Physical Exam  Constitutional: She is oriented to person, place, and time. She appears well-developed and well-nourished.  HENT:  Right Ear: External ear normal.  Left Ear: External ear normal.  Nose: Nose normal.  Mouth/Throat: Oropharynx is clear and moist.  Eyes: Pupils are equal, round, and reactive to light.  Neck: Normal range of motion.  Cardiovascular: Normal rate, regular rhythm and normal heart sounds.   Pulmonary/Chest: Effort normal.  Lymphadenopathy:    She has no cervical adenopathy.  Neurological: She is alert and oriented to person, place, and time.  Skin: Skin is warm.  Nursing note and vitals reviewed.    UC Treatments / Results  Labs (all labs ordered are listed, but only abnormal results are displayed) Labs Reviewed  POCT URINALYSIS DIP (DEVICE) - Abnormal; Notable for the following:       Result Value   Hgb urine dipstick TRACE (*)    All other components within normal  limits    EKG  EKG Interpretation None       Radiology No results found.  Procedures Procedures (including critical care time)  Medications Ordered in UC Medications - No data to display   Initial Impression / Assessment and Plan / UC Course  I have reviewed the triage vital signs and the nursing notes.  Pertinent labs & imaging results that were available during my care of the patient were reviewed by me and considered in my medical decision making (see chart for details).  Clinical Course      Final Clinical Impressions(s) / UC Diagnoses   Final diagnoses:  Upper respiratory tract infection, unspecified type  Sinus headache    New Prescriptions New Prescriptions   DOXYCYCLINE (VIBRAMYCIN) 100 MG CAPSULE    Take 1 capsule (100 mg total) by mouth 2 (two) times daily.   IPRATROPIUM (ATROVENT) 0.06 % NASAL SPRAY    Place 2 sprays into both nostrils 4 (four) times daily.     Linna HoffJames D Kindl, MD 01/11/16 [phone]

## 2016-01-11 NOTE — ED Provider Notes (Signed)
[facility]    CSN: [id number] Arrival date & time: 01/11/16  1517     History   Chief Complaint No chief complaint on file.   HPI Mary FurlongDianne [name] is a 50 y.o. female.   This is a 50 year old woman with a history of substance abuse who presents with headache, abdominal pain, and rash.      Past Medical History:  Diagnosis Date  . Bipolar depression (HCC)   . Borderline personality disorder   . Depression   . Drug abuse and dependence (HCC)   . H/O hiatal hernia   . Hepatitis C   . Hernia   . Ileus, postoperative (HCC) 02/13/2013  . PTSD (post-traumatic stress disorder)   . Schizophrenia [facility])     Patient Active Problem List   Diagnosis Date Noted  . Ileus, postoperative (HCC) 02/13/2013  . Hepatitis C   . Drug abuse and dependence (HCC)   . Incarcerated incisional hernia s/p lap repair 02/10/2013 02/10/2013  . Polysubstance dependence (HCC) 10/16/2011  . Substance induced mood disorder (HCC) 10/16/2011  . GERD without esophagitis 07/04/2011  . BV (bacterial vaginosis) 06/04/2011    Past Surgical History:  Procedure Laterality Date  . DIAGNOSTIC LAPAROSCOPIC LIVER BIOPSY N/A 02/10/2013   Procedure: DIAGNOSTIC LAPAROSCOPIC ;  Surgeon: Ardeth SportsmanSteven C. Gross, MD;  Location: [facility];  Service: General;  Laterality: N/A;  DIAGNOSTIC LAPAROSCOPY,laparoscopic ventral hernia repair with mesh lysis of adhesions  . HERNIA REPAIR    . SPLENECTOMY    . TUBAL LIGATION      OB History    No data available       Home Medications    Prior to Admission medications   Medication Sig Start Date End Date Taking? Authorizing Provider  Aspirin-Acetaminophen (GOODY BODY PAIN) 500-325 MG PACK Take 1 Package by mouth daily as needed (for pain).    Historical Provider, MD  cephALEXin (KEFLEX) 500 MG capsule Take 1 capsule (500 mg total) by mouth 4 (four) times daily. 09/04/15   Kristen N Ward, DO  methocarbamol (ROBAXIN) 500 MG tablet Take 1 tablet (500 mg total) by mouth 2  (two) times daily. 07/25/15   Nicole Pisciotta, PA-C  metroNIDAZOLE (FLAGYL) 500 MG tablet Take 1 tablet (500 mg total) by mouth 2 (two) times daily. 07/25/15   Nicole Pisciotta, PA-C  naproxen sodium (ANAPROX) 220 MG tablet Take 220 mg by mouth 2 (two) times daily as needed (for pain).    Historical Provider, MD  omeprazole (PRILOSEC OTC) 20 MG tablet Take 20 mg by mouth daily as needed (for heartburn).    Historical Provider, MD  ondansetron (ZOFRAN ODT) 4 MG disintegrating tablet Take 1 tablet (4 mg total) by mouth every 8 (eight) hours as needed for nausea or vomiting. 09/04/15   Kristen N Ward, DO  pantoprazole (PROTONIX) 40 MG tablet Take 1 tablet (40 mg total) by mouth daily. 05/26/15   Gwyneth SproutWhitney Plunkett, MD  QUEtiapine (SEROQUEL) 300 MG tablet Take 300 mg by mouth at bedtime.    Historical Provider, MD  sertraline (ZOLOFT) 25 MG tablet Take 25 mg by mouth daily.    Historical Provider, MD    Family History No family history on file.  Social History Social History  Substance Use Topics  . Smoking status: Current Every Day Smoker    Packs/day: 0.50    Years: 20.00    Types: Cigarettes  . Smokeless tobacco: Not on file  . Alcohol use No     Comment: quit  Allergies   Review of patient's allergies indicates no known allergies.   Review of Systems Review of Systems   Physical Exam Triage Vital Signs ED Triage Vitals  Enc Vitals Group     BP      Pulse      Resp      Temp      Temp src      SpO2      Weight      Height      Head Circumference      Peak Flow      Pain Score      Pain Loc      Pain Edu?      Excl. in GC?    No data found.   Updated Vital Signs There were no vitals taken for this visit.  Visual Acuity Right Eye Distance:   Left Eye Distance:   Bilateral Distance:    Right Eye Near:   Left Eye Near:    Bilateral Near:     Physical Exam   UC Treatments / Results  Labs (all labs ordered are listed, but only abnormal results are  displayed) Labs Reviewed - No data to display  EKG  EKG Interpretation None       Radiology No results found.  Procedures Procedures (including critical care time)  Medications Ordered in UC Medications - No data to display   Initial Impression / Assessment and Plan / UC Course  I have reviewed the triage vital signs and the nursing notes.  Pertinent labs & imaging results that were available during my care of the patient were reviewed by me and considered in my medical decision making (see chart for details).  Clinical Course      Final Clinical Impressions(s) / UC Diagnoses   Final diagnoses:  None    New Prescriptions New Prescriptions   No medications on file     Elvina SidleKurt Kadence Mimbs, MD 01/11/16 1614

## 2016-01-11 NOTE — ED Notes (Signed)
Report  Phoned  To  Tina   Nurse  first 

## 2016-01-11 NOTE — ED Triage Notes (Signed)
Headache    X  3  Days    abd  Pain  X  4  Days      Rash        On  Legs  X    X  1  Year  Getting  Rash   On  Arms  X  3  Days

## 2016-01-27 ENCOUNTER — Emergency Department (HOSPITAL_COMMUNITY): Payer: Self-pay

## 2016-01-27 ENCOUNTER — Emergency Department (HOSPITAL_COMMUNITY)
Admission: EM | Admit: 2016-01-27 | Discharge: 2016-01-27 | Disposition: A | Discharge status: Home / Self Care | Payer: Self-pay | Attending: Emergency Medicine | Admitting: Emergency Medicine

## 2016-01-27 ENCOUNTER — Encounter (HOSPITAL_COMMUNITY): Payer: Self-pay | Admitting: Emergency Medicine

## 2016-01-27 DIAGNOSIS — R05 Cough: Secondary | ICD-10-CM

## 2016-01-27 DIAGNOSIS — R059 Cough, unspecified: Secondary | ICD-10-CM

## 2016-01-27 DIAGNOSIS — K219 Gastro-esophageal reflux disease without esophagitis: Secondary | ICD-10-CM

## 2016-01-27 DIAGNOSIS — F17210 Nicotine dependence, cigarettes, uncomplicated: Secondary | ICD-10-CM | POA: Insufficient documentation

## 2016-01-27 DIAGNOSIS — J069 Acute upper respiratory infection, unspecified: Secondary | ICD-10-CM

## 2016-01-27 DIAGNOSIS — Z7982 Long term (current) use of aspirin: Secondary | ICD-10-CM | POA: Insufficient documentation

## 2016-01-27 LAB — CBC WITH DIFFERENTIAL/PLATELET
BASOS ABS: 0.1 10*3/uL (ref 0.0–0.1)
BASOS PCT: 0 %
EOS ABS: 0.5 10*3/uL (ref 0.0–0.7)
Eosinophils Relative: 4 %
HEMATOCRIT: 40.2 % (ref 36.0–46.0)
HEMOGLOBIN: 14 g/dL (ref 12.0–15.0)
Lymphocytes Relative: 43 %
Lymphs Abs: 5.8 10*3/uL — ABNORMAL HIGH (ref 0.7–4.0)
MCH: 31 pg (ref 26.0–34.0)
MCHC: 34.8 g/dL (ref 30.0–36.0)
MCV: 88.9 fL (ref 78.0–100.0)
MONOS PCT: 10 %
Monocytes Absolute: 1.4 10*3/uL — ABNORMAL HIGH (ref 0.1–1.0)
NEUTROS ABS: 5.9 10*3/uL (ref 1.7–7.7)
NEUTROS PCT: 43 %
Platelets: 351 10*3/uL (ref 150–400)
RBC: 4.52 MIL/uL (ref 3.87–5.11)
RDW: 13.3 % (ref 11.5–15.5)
WBC: 13.6 10*3/uL — AB (ref 4.0–10.5)

## 2016-01-27 LAB — COMPREHENSIVE METABOLIC PANEL
ALBUMIN: 4.2 g/dL (ref 3.5–5.0)
ALT: 18 U/L (ref 14–54)
ANION GAP: 4 — AB (ref 5–15)
AST: 17 U/L (ref 15–41)
Alkaline Phosphatase: 115 U/L (ref 38–126)
BILIRUBIN TOTAL: 0.5 mg/dL (ref 0.3–1.2)
BUN: 11 mg/dL (ref 6–20)
CO2: 25 mmol/L (ref 22–32)
Calcium: 10.4 mg/dL — ABNORMAL HIGH (ref 8.9–10.3)
Chloride: 107 mmol/L (ref 101–111)
Creatinine, Ser: 0.65 mg/dL (ref 0.44–1.00)
GFR calc non Af Amer: >60 mL/min (ref >60)
GLUCOSE: 111 mg/dL — AB (ref 65–99)
POTASSIUM: 4.3 mmol/L (ref 3.5–5.1)
SODIUM: 136 mmol/L (ref 135–145)
TOTAL PROTEIN: 7.5 g/dL (ref 6.5–8.1)

## 2016-01-27 LAB — I-STAT TROPONIN, ED: TROPONIN I, POC: 0 ng/mL (ref 0.00–0.08)

## 2016-01-27 LAB — LIPASE, BLOOD: Lipase: 15 U/L (ref 11–51)

## 2016-01-27 MED ORDER — PANTOPRAZOLE SODIUM 40 MG PO TBEC
40.0000 mg | DELAYED_RELEASE_TABLET | Freq: Once | ORAL | Status: AC
  Administered 2016-01-27: 40 mg via ORAL
  Filled 2016-01-27: qty 1

## 2016-01-27 MED ORDER — PANTOPRAZOLE SODIUM 20 MG PO TBEC: 20.0000 mg | DELAYED_RELEASE_TABLET | Freq: Two times a day (BID) | ORAL | 0 refills | Status: DC

## 2016-01-27 MED ORDER — ONDANSETRON 4 MG PO TBDP: 4.0000 mg | ORAL_TABLET | Freq: Three times a day (TID) | ORAL | 0 refills | Status: DC | PRN

## 2016-01-27 MED ORDER — MORPHINE SULFATE (PF) 4 MG/ML IV SOLN
4.0000 mg | Freq: Once | INTRAVENOUS | Status: AC
  Administered 2016-01-27: 4 mg via INTRAMUSCULAR
  Filled 2016-01-27: qty 1

## 2016-01-27 MED ORDER — GUAIFENESIN-CODEINE 100-10 MG/5ML PO SOLN: 5.0000 mL | Freq: Three times a day (TID) | ORAL | 0 refills | Status: DC | PRN

## 2016-01-27 MED ORDER — SUCRALFATE 1 G PO TABS: 1.0000 g | ORAL_TABLET | Freq: Three times a day (TID) | ORAL | 0 refills | Status: DC

## 2016-01-27 MED ORDER — FAMOTIDINE 20 MG PO TABS
40.0000 mg | ORAL_TABLET | Freq: Once | ORAL | Status: AC
  Administered 2016-01-27: 40 mg via ORAL
  Filled 2016-01-27: qty 2

## 2016-01-27 MED ORDER — HYDROCODONE-ACETAMINOPHEN 5-325 MG PO TABS: 1.0000 | ORAL_TABLET | Freq: Three times a day (TID) | ORAL | 0 refills | Status: DC | PRN

## 2016-01-27 MED ORDER — GI COCKTAIL ~~LOC~~
30.0000 mL | Freq: Once | ORAL | Status: AC
  Administered 2016-01-27: 30 mL via ORAL
  Filled 2016-01-27: qty 30

## 2016-01-27 NOTE — ED Notes (Signed)
Patient transported to X-ray 

## 2016-01-27 NOTE — ED Notes (Signed)
US at bedside

## 2016-01-27 NOTE — ED Triage Notes (Signed)
Per pt, states headache and cough-was seen for same symptoms 2 weeks ago-diagnosed with sinus infection

## 2016-01-27 NOTE — ED Triage Notes (Signed)
Per EMS report: pt coming home and presents with cough and headache x 2 weeks.  [facility] Pt a/o x 4 and ambulatory.

## 2016-01-27 NOTE — ED Provider Notes (Signed)
[facility] Provider Note   CSN: [id number]  Arrival date & time: 01/27/16  1824  By signing my name below, I, Teofilo Pod, attest that this documentation has been prepared under the direction and in the presence of Danelle Berry, PA-C. Electronically Signed: Teofilo Pod, ED Scribe. 01/27/2016. 8:34 PM.    History   Chief Complaint Chief Complaint  Patient presents with  . Cough    The history is provided by the patient. No language interpreter was used.   HPI Comments:  Mary [name] is a 50 y.o. female who presents to the Emergency Department complaining of a constant dry cough x 2 weeks. Pt complains of associated constant chest pain that radiates to her back x 2 days. Pt describes the pain as "burning" and that it is worse when taking deep breaths and when coughing.  Pt also notes associated SOB, nausea, and wheezing. Pt states that she has not been eating very much for 2 days due to pain. Pt is a smoker. Pt was given a GI cocktail 2 weeks ago that provided mild, temporary relief. Pt denies fever, vomiting.   Past Medical History:  Diagnosis Date  . Bipolar depression (HCC)   . Borderline personality disorder   . Depression   . Drug abuse and dependence (HCC)   . H/O hiatal hernia   . Hepatitis C   . Hernia   . Ileus, postoperative (HCC) 02/13/2013  . PTSD (post-traumatic stress disorder)   . Schizophrenia [facility])     Patient Active Problem List   Diagnosis Date Noted  . Ileus, postoperative (HCC) 02/13/2013  . Hepatitis C   . Drug abuse and dependence (HCC)   . Incarcerated incisional hernia s/p lap repair 02/10/2013 02/10/2013  . Polysubstance dependence (HCC) 10/16/2011  . Substance induced mood disorder (HCC) 10/16/2011  . GERD without esophagitis 07/04/2011  . BV (bacterial vaginosis) 06/04/2011    Past Surgical History:  Procedure Laterality Date  . DIAGNOSTIC LAPAROSCOPIC LIVER BIOPSY N/A 02/10/2013   Procedure: DIAGNOSTIC LAPAROSCOPIC ;   Surgeon: Ardeth Sportsman, MD;  Location: [facility];  Service: General;  Laterality: N/A;  DIAGNOSTIC LAPAROSCOPY,laparoscopic ventral hernia repair with mesh lysis of adhesions  . HERNIA REPAIR    . SPLENECTOMY    . TUBAL LIGATION      OB History    No data available       Home Medications    Prior to Admission medications   Medication Sig Start Date End Date Taking? Authorizing Provider  Aspirin-Acetaminophen (GOODY BODY PAIN) 500-325 MG PACK Take 1 Package by mouth daily as needed (for pain).    Historical Provider, MD  cephALEXin (KEFLEX) 500 MG capsule Take 1 capsule (500 mg total) by mouth 4 (four) times daily. 09/04/15   Kristen N Ward, DO  doxycycline (VIBRAMYCIN) 100 MG capsule Take 1 capsule (100 mg total) by mouth 2 (two) times daily. 01/11/16   Linna Hoff, MD  guaiFENesin-codeine 100-10 MG/5ML syrup Take 5 mLs by mouth 3 (three) times daily as needed for cough. 01/27/16   Danelle Berry, PA-C  HYDROcodone-acetaminophen (NORCO/VICODIN) 5-325 MG tablet Take 1 tablet by mouth every 8 (eight) hours as needed for severe pain. 01/27/16   Danelle Berry, PA-C  ipratropium (ATROVENT) 0.06 % nasal spray Place 2 sprays into both nostrils 4 (four) times daily. 01/11/16   Linna Hoff, MD  methocarbamol (ROBAXIN) 500 MG tablet Take 1 tablet (500 mg total) by mouth 2 (two) times daily. 07/25/15  Nicole Pisciotta, PA-C  metroNIDAZOLE (FLAGYL) 500 MG tablet Take 1 tablet (500 mg total) by mouth 2 (two) times daily. 07/25/15   Nicole Pisciotta, PA-C  naproxen sodium (ANAPROX) 220 MG tablet Take 220 mg by mouth 2 (two) times daily as needed (for pain).    Historical Provider, MD  omeprazole (PRILOSEC OTC) 20 MG tablet Take 20 mg by mouth daily as needed (for heartburn).    Historical Provider, MD  ondansetron (ZOFRAN ODT) 4 MG disintegrating tablet Take 1 tablet (4 mg total) by mouth every 8 (eight) hours as needed for nausea or vomiting. 09/04/15   Kristen N Ward, DO  ondansetron (ZOFRAN ODT) 4 MG  disintegrating tablet Take 1 tablet (4 mg total) by mouth every 8 (eight) hours as needed for nausea or vomiting. 01/27/16   Danelle BerryLeisa Malina Geers, PA-C  pantoprazole (PROTONIX) 20 MG tablet Take 1 tablet (20 mg total) by mouth 2 (two) times daily. 01/27/16   Danelle BerryLeisa Mazal Ebey, PA-C  pantoprazole (PROTONIX) 40 MG tablet Take 1 tablet (40 mg total) by mouth daily. 05/26/15   Gwyneth SproutWhitney Plunkett, MD  QUEtiapine (SEROQUEL) 300 MG tablet Take 300 mg by mouth at bedtime.    Historical Provider, MD  sertraline (ZOLOFT) 25 MG tablet Take 25 mg by mouth daily.    Historical Provider, MD  sucralfate (CARAFATE) 1 g tablet Take 1 tablet (1 g total) by mouth 4 (four) times daily -  with meals and at bedtime. 01/27/16   Danelle BerryLeisa Zavian Slowey, PA-C    Family History No family history on file.  Social History Social History  Substance Use Topics  . Smoking status: Current Every Day Smoker    Packs/day: 0.50    Years: 20.00    Types: Cigarettes  . Smokeless tobacco: Not on file  . Alcohol use No     Comment: quit     Allergies   Patient has no known allergies.   Review of Systems Review of Systems 10 Systems reviewed and are negative for acute change except as noted in the HPI.   Physical Exam Updated Vital Signs BP 122/91 (BP Location: Left Arm)   Pulse 77   Temp 98 F (36.7 C) (Oral)   Resp 22   Wt 79.2 kg   SpO2 99%   BMI 29.96 kg/m   Physical Exam  Constitutional: She is oriented to person, place, and time. She appears well-developed and well-nourished. No distress.  Nasal congestion with erythematous nasal mucosa  HENT:  Head: Normocephalic and atraumatic.  Nose: Nose normal.  Mouth/Throat: Oropharynx is clear and moist. No oropharyngeal exudate.  Eyes: Conjunctivae and EOM are normal. Pupils are equal, round, and reactive to light. Right eye exhibits no discharge. Left eye exhibits no discharge. No scleral icterus.  Neck: Normal range of motion. No JVD present. No tracheal deviation present. No  thyromegaly present.  Cardiovascular: Normal rate, regular rhythm, normal heart sounds and intact distal pulses.  Exam reveals no gallop and no friction rub.   No murmur heard. Pulmonary/Chest: Effort normal and breath sounds normal. No respiratory distress. She has no wheezes. She has no rales. She exhibits no tenderness.  Abdominal: Soft. Bowel sounds are normal. She exhibits no distension and no mass. There is tenderness. There is guarding. There is no rebound.  Musculoskeletal: Normal range of motion. She exhibits no edema or tenderness.  Lymphadenopathy:    She has no cervical adenopathy.  Neurological: She is alert and oriented to person, place, and time. She has normal reflexes. She displays  normal reflexes. No cranial nerve deficit. She exhibits normal muscle tone. Coordination normal.  Skin: Skin is warm and dry. No rash noted. She is not diaphoretic. No erythema. No pallor.  Psychiatric: She has a normal mood and affect. Her behavior is normal. Judgment and thought content normal.  Nursing note and vitals reviewed.    ED Treatments / Results  DIAGNOSTIC STUDIES:  Oxygen Saturation is 96% on RA, normal by my interpretation.    COORDINATION OF CARE:  8:34 PM Discussed treatment plan with pt at bedside and pt agreed to plan.   Labs (all labs ordered are listed, but only abnormal results are displayed) Labs Reviewed  COMPREHENSIVE METABOLIC PANEL - Abnormal; Notable for the following:       Result Value   Glucose, Bld 111 (*)    Calcium 10.4 (*)    Anion gap 4 (*)    All other components within normal limits  CBC WITH DIFFERENTIAL/PLATELET - Abnormal; Notable for the following:    WBC 13.6 (*)    Lymphs Abs 5.8 (*)    Monocytes Absolute 1.4 (*)    All other components within normal limits  LIPASE, BLOOD  I-STAT TROPOININ, ED    EKG  EKG Interpretation  Date/Time:  Thursday January 27 2016 20:57:03 EST Ventricular Rate:  74 PR Interval:    QRS Duration: 86 QT  Interval:  401 QTC Calculation: 445 R Axis:   63 Text Interpretation:  Normal sinus rhythm Poor data quality appears unchanged from first prior tracing 07/25/15 Confirmed by RAY MD, Duwayne HeckANIELLE [phone]([id number]) on 01/28/2016 2:41:02 PM       Radiology Dg Chest 2 View  Result Date: 01/27/2016 CLINICAL DATA:  Midline chest pain for 2 days. EXAM: CHEST  2 VIEW COMPARISON:  07/25/2015 FINDINGS: The lungs are clear. The pulmonary vasculature is normal. Heart size is normal. Hilar and mediastinal contours are unremarkable. There is no pleural effusion. IMPRESSION: No active cardiopulmonary disease. Electronically Signed   By: Ellery Plunkaniel R Mitchell M.D.   On: 01/27/2016 21:05   [place] Abdomen Limited Ruq  Result Date: 01/27/2016 CLINICAL DATA:  Acute right upper quadrant pain starting 2 days ago. EXAM: US ABDOMEN LIMITED - RIGHT UPPER QUADRANT COMPARISON:  03/13/2015 and CT of 02/10/2013 FINDINGS: Gallbladder: No gallstones or wall thickening visualized. No sonographic Murphy sign noted by sonographer. The gallbladder is physiologically distended. Common bile duct: Diameter: 2.5 mm Liver: No focal lesion identified. Within normal limits in parenchymal echogenicity. IMPRESSION: Unremarkable right upper quadrant ultrasound of the abdomen. Electronically Signed   By: Tollie Ethavid  Kwon M.D.   On: 01/27/2016 21:51    Procedures Procedures (including critical care time)  Medications Ordered in ED Medications  gi cocktail (Maalox,Lidocaine,Donnatal) (30 mLs Oral Given 01/27/16 2104)  famotidine (PEPCID) tablet 40 mg (40 mg Oral Given 01/27/16 2103)  morphine 4 MG/ML injection 4 mg (4 mg Intramuscular Given 01/27/16 2125)  pantoprazole (PROTONIX) EC tablet 40 mg (40 mg Oral Given 01/27/16 2213)     Initial Impression / Assessment and Plan / ED Course  I have reviewed the triage vital signs and the nursing notes.  Pertinent labs & imaging results that were available during my care of the patient were reviewed by me and  considered in my medical decision making (see chart for details).  Clinical Course    CP, atypical of ACS, associated with reflux, N, V (spitting up saliva), epigastric and RUQ abdominal pain, suspect gastritis/esophagitis, but pt does complain of CP, SOB and persistent non productive  cough x 2weeks.  She was worked up for CP and abdominal pain.  CXR negative, troponin negative, EKG NSR, no change from prior. Suspect CP is secondary to GERD/esophagitis, and possibly secondary to bronchitis.  She had severe epigastric and RUQ abdominal tenderness on exam, complains of inability to eat or drink for several days, RUQ [place] negative.  Lab work with leukocytosis, and otherwise unremarkable.  Pt was able to tolerate PO's in the ER, pain was managed.  Will treat GERD more aggresively, tx for bronchitis, encourage GI follow up.  Pt discharged home in good condition with stable VS (tolerating PO's and w/o respiratory distress or difficultly).  Final Clinical Impressions(s) / ED Diagnoses   Final diagnoses:  Gastroesophageal reflux disease, esophagitis presence not specified  Cough  Upper respiratory tract infection, unspecified type    New Prescriptions New Prescriptions   GUAIFENESIN-CODEINE 100-10 MG/5ML SYRUP    Take 5 mLs by mouth 3 (three) times daily as needed for cough.   HYDROCODONE-ACETAMINOPHEN (NORCO/VICODIN) 5-325 MG TABLET    Take 1 tablet by mouth every 8 (eight) hours as needed for severe pain.   ONDANSETRON (ZOFRAN ODT) 4 MG DISINTEGRATING TABLET    Take 1 tablet (4 mg total) by mouth every 8 (eight) hours as needed for nausea or vomiting.   PANTOPRAZOLE (PROTONIX) 20 MG TABLET    Take 1 tablet (20 mg total) by mouth 2 (two) times daily.   SUCRALFATE (CARAFATE) 1 G TABLET    Take 1 tablet (1 g total) by mouth 4 (four) times daily -  with meals and at bedtime.   I personally performed the services described in this documentation, which was scribed in my presence. The recorded information has  been reviewed and is accurate.       Danelle Berry, PA-C 02/05/16 0259    Danelle Berry, PA-C 02/08/16 1722    Loren Racer, MD 02/16/16 [phone]

## 2016-01-27 NOTE — ED Notes (Signed)
Patient tolerating PO fluids 

## 2016-04-07 ENCOUNTER — Ambulatory Visit (HOSPITAL_COMMUNITY)
Admission: EM | Admit: 2016-04-07 | Discharge: 2016-04-07 | Disposition: A | Discharge status: Home / Self Care | Payer: BLUE CROSS/BLUE SHIELD | Attending: Family Medicine | Admitting: Family Medicine

## 2016-04-07 ENCOUNTER — Encounter (HOSPITAL_COMMUNITY): Payer: Self-pay | Admitting: Emergency Medicine

## 2016-04-07 DIAGNOSIS — K2900 Acute gastritis without bleeding: Secondary | ICD-10-CM

## 2016-04-07 DIAGNOSIS — B349 Viral infection, unspecified: Secondary | ICD-10-CM

## 2016-04-07 MED ORDER — GI COCKTAIL ~~LOC~~
30.0000 mL | Freq: Once | ORAL | Status: AC
  Administered 2016-04-07: 30 mL via ORAL

## 2016-04-07 MED ORDER — ONDANSETRON 4 MG PO TBDP: 4.0000 mg | ORAL_TABLET | Freq: Three times a day (TID) | ORAL | 0 refills | Status: DC | PRN

## 2016-04-07 MED ORDER — GI COCKTAIL ~~LOC~~
ORAL | Status: AC
  Filled 2016-04-07: qty 30

## 2016-04-07 MED ORDER — ONDANSETRON 4 MG PO TBDP
4.0000 mg | ORAL_TABLET | Freq: Once | ORAL | Status: AC
  Administered 2016-04-07: 4 mg via ORAL

## 2016-04-07 MED ORDER — ONDANSETRON 4 MG PO TBDP
ORAL_TABLET | ORAL | Status: AC
  Filled 2016-04-07: qty 1

## 2016-04-07 NOTE — Discharge Instructions (Signed)
I am treating you for viral gastritis. You have been given a GI cocktail here for lidocaine, Maalox, and Donnatal. You have also been given a medicine for nausea called Zofran. I have sent a prescription for this medicine to your pharmacy as well. I recommend you rest, drink plenty of fluids and avoid solid foods for the rest of the day. You may try simple foods such as toast, rice, and bananas tomorrow. If you can tolerate these advance your diet as tolerated. Should your symptoms fail to improve or worsen follow up with your primary care provider or return to clinic.

## 2016-04-07 NOTE — ED Provider Notes (Signed)
CSN: [id number]     Arrival date & time 04/07/16  1557 History   First MD Initiated Contact with Patient 04/07/16 1736     Chief Complaint  Patient presents with  . URI  . Abdominal Pain   (Consider location/radiation/quality/duration/timing/severity/associated sxs/prior Treatment) 51 year old female presents with one day history of nausea, vomiting, and epigastric pain, denies fever, congestion, diarrhea, muscle aches, or body aches. She has had decreased appetite and fatigue    The history is provided by the patient.  URI  Abdominal Pain    Past Medical History:  Diagnosis Date  . Bipolar depression (HCC)   . Borderline personality disorder   . Depression   . Drug abuse and dependence (HCC)   . H/O hiatal hernia   . Hepatitis C   . Hernia   . Ileus, postoperative (HCC) 02/13/2013  . PTSD (post-traumatic stress disorder)   . Schizophrenia [facility])    Past Surgical History:  Procedure Laterality Date  . DIAGNOSTIC LAPAROSCOPIC LIVER BIOPSY N/A 02/10/2013   Procedure: DIAGNOSTIC LAPAROSCOPIC ;  Surgeon: Ardeth Sportsman, MD;  Location: [facility];  Service: General;  Laterality: N/A;  DIAGNOSTIC LAPAROSCOPY,laparoscopic ventral hernia repair with mesh lysis of adhesions  . HERNIA REPAIR    . SPLENECTOMY    . TUBAL LIGATION     History reviewed. No pertinent family history. Social History  Substance Use Topics  . Smoking status: Current Every Day Smoker    Packs/day: 0.50    Years: 20.00    Types: Cigarettes  . Smokeless tobacco: Never Used  . Alcohol use No     Comment: quit   OB History    No data available     Review of Systems  Reason unable to perform ROS: as covered in HPI.  Gastrointestinal: Positive for abdominal pain.  All other systems reviewed and are negative.   Allergies  Patient has no known allergies.  Home Medications   Prior to Admission medications   Medication Sig Start Date End Date Taking? Authorizing Provider  QUEtiapine (SEROQUEL) 300 MG  tablet Take 300 mg by mouth at bedtime.   Yes Historical Provider, MD  sertraline (ZOLOFT) 25 MG tablet Take 25 mg by mouth daily.   Yes Historical Provider, MD  sucralfate (CARAFATE) 1 g tablet Take 1 tablet (1 g total) by mouth 4 (four) times daily -  with meals and at bedtime. 01/27/16  Yes Danelle Berry, PA-C  Aspirin-Acetaminophen (GOODY BODY PAIN) 500-325 MG PACK Take 1 Package by mouth daily as needed (for pain).    Historical Provider, MD  cephALEXin (KEFLEX) 500 MG capsule Take 1 capsule (500 mg total) by mouth 4 (four) times daily. 09/04/15   Kristen N Ward, DO  doxycycline (VIBRAMYCIN) 100 MG capsule Take 1 capsule (100 mg total) by mouth 2 (two) times daily. 01/11/16   Linna Hoff, MD  guaiFENesin-codeine 100-10 MG/5ML syrup Take 5 mLs by mouth 3 (three) times daily as needed for cough. 01/27/16   Danelle Berry, PA-C  HYDROcodone-acetaminophen (NORCO/VICODIN) 5-325 MG tablet Take 1 tablet by mouth every 8 (eight) hours as needed for severe pain. 01/27/16   Danelle Berry, PA-C  ipratropium (ATROVENT) 0.06 % nasal spray Place 2 sprays into both nostrils 4 (four) times daily. 01/11/16   Linna Hoff, MD  methocarbamol (ROBAXIN) 500 MG tablet Take 1 tablet (500 mg total) by mouth 2 (two) times daily. 07/25/15   Nicole Pisciotta, PA-C  metroNIDAZOLE (FLAGYL) 500 MG tablet Take 1 tablet (  500 mg total) by mouth 2 (two) times daily. 07/25/15   Nicole Pisciotta, PA-C  naproxen sodium (ANAPROX) 220 MG tablet Take 220 mg by mouth 2 (two) times daily as needed (for pain).    Historical Provider, MD  omeprazole (PRILOSEC OTC) 20 MG tablet Take 20 mg by mouth daily as needed (for heartburn).    Historical Provider, MD  ondansetron (ZOFRAN ODT) 4 MG disintegrating tablet Take 1 tablet (4 mg total) by mouth every 8 (eight) hours as needed for nausea or vomiting. 09/04/15   Kristen N Ward, DO  ondansetron (ZOFRAN ODT) 4 MG disintegrating tablet Take 1 tablet (4 mg total) by mouth every 8 (eight) hours as needed  for nausea or vomiting. 01/27/16   Danelle Berry, PA-C  ondansetron (ZOFRAN ODT) 4 MG disintegrating tablet Take 1 tablet (4 mg total) by mouth every 8 (eight) hours as needed for nausea or vomiting. 04/07/16   Dorena Bodo, NP  pantoprazole (PROTONIX) 20 MG tablet Take 1 tablet (20 mg total) by mouth 2 (two) times daily. 01/27/16   Danelle Berry, PA-C  pantoprazole (PROTONIX) 40 MG tablet Take 1 tablet (40 mg total) by mouth daily. 05/26/15   Gwyneth Sprout, MD   Meds Ordered and Administered this Visit   Medications  gi cocktail (Maalox,Lidocaine,Donnatal) (30 mLs Oral Given 04/07/16 1748)  ondansetron (ZOFRAN-ODT) disintegrating tablet 4 mg (4 mg Oral Given 04/07/16 1748)    BP 123/78 (BP Location: Left Arm)   Pulse 79   Temp 98.2 F (36.8 C) (Oral)   Resp 18   SpO2 96%  No data found.   Physical Exam  Constitutional: She is oriented to person, place, and time. She appears well-developed and well-nourished. She does not have a sickly appearance. She does not appear ill. No distress.  HENT:  Right Ear: Tympanic membrane and external ear normal.  Left Ear: Tympanic membrane and external ear normal.  Nose: Nose normal. Right sinus exhibits no maxillary sinus tenderness and no frontal sinus tenderness. Left sinus exhibits no maxillary sinus tenderness and no frontal sinus tenderness.  Mouth/Throat: Uvula is midline and oropharynx is clear and moist. No oropharyngeal exudate.  Eyes: Pupils are equal, round, and reactive to light.  Neck: Normal range of motion. Neck supple. No JVD present.  Cardiovascular: Normal rate and regular rhythm.   Pulmonary/Chest: Effort normal and breath sounds normal. No respiratory distress. She has no wheezes.  Abdominal: Soft. Normal appearance. She exhibits no distension. Bowel sounds are increased. There is no hepatosplenomegaly. There is tenderness in the epigastric area. There is no rigidity, no guarding, no CVA tenderness, no tenderness at McBurney's  point and negative Murphy's sign.  Lymphadenopathy:       Head (right side): Submandibular adenopathy present.    She has no cervical adenopathy.  Neurological: She is alert and oriented to person, place, and time.  Skin: Skin is warm and dry. Capillary refill takes less than 2 seconds. She is not diaphoretic.  Psychiatric: She has a normal mood and affect.  Nursing note and vitals reviewed.   Urgent Care Course     Procedures (including critical care time)  Labs Review Labs Reviewed - No data to display  Imaging Review No results found.   Visual Acuity Review  Right Eye Distance:   Left Eye Distance:   Bilateral Distance:    Right Eye Near:   Left Eye Near:    Bilateral Near:         MDM   1. Acute  gastritis without hemorrhage, unspecified gastritis type   2. Viral illness    I am treating you for viral gastritis. You have been given a GI cocktail here for lidocaine, Maalox, and Donnatal. You have also been given a medicine for nausea called Zofran. I have sent a prescription for this medicine to your pharmacy as well. I recommend you rest, drink plenty of fluids and avoid solid foods for the rest of the day. You may try simple foods such as toast, rice, and bananas tomorrow. If you can tolerate these advance your diet as tolerated. Should your symptoms fail to improve or worsen follow up with your primary care provider or return to clinic.     Dorena BodoLawrence Aina Rossbach, NP 04/07/16 1810

## 2016-04-07 NOTE — ED Triage Notes (Signed)
Here for abd pain onset last night associated w/nausea  Also c/o cold sx onset last night associated w/prod cough, HA  Denies fevers  Pt needing note for work as well.   A&O x4... NAD

## 2016-04-12 ENCOUNTER — Ambulatory Visit (INDEPENDENT_AMBULATORY_CARE_PROVIDER_SITE_OTHER): Payer: BLUE CROSS/BLUE SHIELD | Admitting: Family Medicine

## 2016-04-12 ENCOUNTER — Ambulatory Visit: Payer: Self-pay | Admitting: Family Medicine

## 2016-04-12 ENCOUNTER — Encounter: Payer: Self-pay | Admitting: Family Medicine

## 2016-04-12 VITALS — BP 120/76 | HR 80 | Resp 12 | Ht 64.0 in | Wt 192.4 lb

## 2016-04-12 DIAGNOSIS — M542 Cervicalgia: Secondary | ICD-10-CM

## 2016-04-12 DIAGNOSIS — L2989 Other pruritus: Secondary | ICD-10-CM | POA: Insufficient documentation

## 2016-04-12 DIAGNOSIS — Z23 Encounter for immunization: Secondary | ICD-10-CM | POA: Diagnosis not present

## 2016-04-12 DIAGNOSIS — B1920 Unspecified viral hepatitis C without hepatic coma: Secondary | ICD-10-CM | POA: Diagnosis not present

## 2016-04-12 DIAGNOSIS — G44209 Tension-type headache, unspecified, not intractable: Secondary | ICD-10-CM | POA: Diagnosis not present

## 2016-04-12 DIAGNOSIS — L298 Other pruritus: Secondary | ICD-10-CM | POA: Insufficient documentation

## 2016-04-12 DIAGNOSIS — J989 Respiratory disorder, unspecified: Secondary | ICD-10-CM | POA: Insufficient documentation

## 2016-04-12 DIAGNOSIS — R0989 Other specified symptoms and signs involving the circulatory and respiratory systems: Secondary | ICD-10-CM

## 2016-04-12 LAB — VITAMIN D 25 HYDROXY (VIT D DEFICIENCY, FRACTURES): VITD: 16.06 ng/mL — AB (ref 30.00–100.00)

## 2016-04-12 MED ORDER — BACLOFEN 10 MG PO TABS: 10.0000 mg | ORAL_TABLET | Freq: Three times a day (TID) | ORAL | 1 refills | Status: DC

## 2016-04-12 MED ORDER — TRIAMCINOLONE ACETONIDE 0.1 % EX CREA: 1.0000 "application " | TOPICAL_CREAM | Freq: Two times a day (BID) | CUTANEOUS | 1 refills | Status: DC

## 2016-04-12 MED ORDER — ALBUTEROL SULFATE HFA 108 (90 BASE) MCG/ACT IN AERS: 2.0000 | INHALATION_SPRAY | Freq: Four times a day (QID) | RESPIRATORY_TRACT | 0 refills | Status: DC | PRN

## 2016-04-12 NOTE — Progress Notes (Signed)
HPI:   [name] is a 51 y.o. female, who is here today to establish care with me.  Former PCP: N/A Last preventive routine visit: many years ago.  Chronic medical problems: Hep C infection, mental health issues,chronic cough,GERD among some. Hx of substance abuse and alcoholism, has been in remission for 4 years.  She follows with psychiatrist every 3 months. Reporting Hx of borderline personality,bipolar disorder,PTSD,depression, and "some kind" of schizophrenia. Currently she is on fluoxetine and Seroquel.  She lives with her boyfriend.  Hx of hepatitis C, according to pt, she was diagnosed about 9 years while she was in prison. She was never treated because recurrent crack use.  Concerns today:   -Headache:  According to patient, she has never had chronic headaches or history of migraines. For the past 2 months she has been waking up with headache and associated nausea.  Pain is dull, 6/10, constant during the day and seems to be alleviated by taking her Seroquel dose around 7:30 pm. Headache starts again next day. Pain starts on oervical area and moves to occipital,parietal and frontal. She denies associated visual changes, vomiting, numbness, tingling, or focal deficit. She takes Goody's powder, which helped but headache does not resolve.  Denies current abdominal pain, changes in bowel habits, blood in stool or melena. Last week she had vomiting and epigastric abdominal pain, she thinks it was related to a "stomach bug."   -Rash: She is also complaining of pretty pruritic rash on pretibial aspect of left lower extremity. According to patient, she has had it for years. She has not tried any topical medication. She is not sure about exacerbating or alleviating factors.  -During the visit I noted persistent cough. She states that for a while she has had intermittent cough and wheezing she denies any chills, fever, sick contact. She has not noted dyspnea or  chest pain.  + Tobacco use.  -She denies any history of diabetes, hyperlipidemia, or hypertension. She does not follow a healthy diet and does not exercises regularly.  -Reviewing records I noted mild HyperCa++ for the past year, stable overall.     Chemistry      Component Value Date/Time   NA 136 01/27/2016 2057   K 4.3 01/27/2016 2057   CL 107 01/27/2016 2057   CO2 25 01/27/2016 2057   BUN 11 01/27/2016 2057   CREATININE 0.65 01/27/2016 2057      Component Value Date/Time   CALCIUM 10.4 (H) 01/27/2016 2057   ALKPHOS 115 01/27/2016 2057   AST 17 01/27/2016 2057   ALT 18 01/27/2016 2057   BILITOT 0.5 01/27/2016 2057       Review of Systems  Constitutional: Positive for fatigue (No more than usual). Negative for activity change, appetite change, fever and unexpected weight change.  HENT: Positive for postnasal drip. Negative for mouth sores, nosebleeds and trouble swallowing.   Eyes: Negative for redness and visual disturbance.  Respiratory: Positive for cough and wheezing. Negative for shortness of breath.   Cardiovascular: Negative for chest pain, palpitations and leg swelling.  Gastrointestinal: Positive for nausea. Negative for abdominal pain and vomiting.       Negative for changes in bowel habits.  Genitourinary: Negative for decreased urine volume, difficulty urinating and hematuria.  Musculoskeletal: Positive for back pain and neck pain. Negative for gait problem.  Skin: Positive for rash. Negative for wound.  Neurological: Positive for headaches. Negative for syncope and weakness.  Hematological: Negative for adenopathy. Does not  bruise/bleed easily.  Psychiatric/Behavioral: Negative for confusion. The patient is nervous/anxious.       Current Outpatient Prescriptions on File Prior to Visit  Medication Sig Dispense Refill  . omeprazole (PRILOSEC OTC) 20 MG tablet Take 20 mg by mouth daily as needed (for heartburn).    . pantoprazole (PROTONIX) 40 MG tablet  Take 1 tablet (40 mg total) by mouth daily. 30 tablet 3  . QUEtiapine (SEROQUEL) 300 MG tablet Take 300 mg by mouth at bedtime.    . sertraline (ZOLOFT) 25 MG tablet Take 25 mg by mouth daily.    . sucralfate (CARAFATE) 1 g tablet Take 1 tablet (1 g total) by mouth 4 (four) times daily -  with meals and at bedtime. 40 tablet 0   No current facility-administered medications on file prior to visit.      Past Medical History:  Diagnosis Date  . Bipolar depression (HCC)   . Borderline personality disorder   . Depression   . Drug abuse and dependence (HCC)   . H/O hiatal hernia   . Hepatitis C   . Hernia   . Ileus, postoperative (HCC) 02/13/2013  . PTSD (post-traumatic stress disorder)   . Schizophrenia (HCC)    No Known Allergies  Family History  Problem Relation Age of Onset  . Alcohol abuse Mother   . Alcohol abuse Father     Social History   Social History  . Marital status: Single    Spouse name: N/A  . Number of children: N/A  . Years of education: N/A   Social History Main Topics  . Smoking status: Current Every Day Smoker    Packs/day: 0.50    Years: 20.00    Types: Cigarettes  . Smokeless tobacco: Never Used  . Alcohol use No     Comment: quit  . Drug use: Yes    Types: "Crack" cocaine, Marijuana, Cocaine     Comment: states former, crack cocaine  . Sexual activity: Yes    Birth control/ protection: None   Other Topics Concern  . None   Social History Narrative  . None    Vitals:   04/12/16 1135  BP: 120/76  Pulse: 80  Resp: 12   O2 sat 96% at RA.  Body mass index is 33.02 kg/m.  Physical Exam  Constitutional: She is oriented to person, place, and time. She appears well-developed. No distress.  HENT:  Head: Atraumatic.  Nose: Right sinus exhibits no frontal sinus tenderness. Left sinus exhibits no frontal sinus tenderness.  Mouth/Throat: Oropharynx is clear and moist and mucous membranes are normal. She has dentures.  Eyes: Conjunctivae  and EOM are normal. Pupils are equal, round, and reactive to light.  Neck: No tracheal deviation present. No thyroid mass and no thyromegaly present.  Cardiovascular: Normal rate and regular rhythm.   No murmur heard. Pulses:      Dorsalis pedis pulses are 2+ on the right side, and 2+ on the left side.  Respiratory: Effort normal and breath sounds normal. No respiratory distress. She has no wheezes. She has no rales.  Prolonged expiration.   GI: Soft. She exhibits no mass. There is no hepatomegaly. There is no tenderness.  Musculoskeletal: She exhibits edema (Trace pitting edema LE,bilateral.) and tenderness (pretibila tenderness with pressure,bilateral.).       Cervical back: She exhibits normal range of motion and no tenderness.       Thoracic back: She exhibits no bony tenderness.  Tenderness upon palpation of trapezium muscles  and upper thoracic paraspinal muscles, R>L.   Lymphadenopathy:    She has no cervical adenopathy.  Neurological: She is alert and oriented to person, place, and time. She has normal strength. Coordination and gait normal.  Skin: Skin is warm. Rash noted. No abrasion noted. Rash is maculopapular. Rash is not vesicular. There is erythema.     Psychiatric: Her mood appears anxious. Her affect is labile.  Well groomed, good eye contact. Restless, rocking frequently while sitting.      ASSESSMENT AND PLAN:  Delayne was seen today for establish care.  Diagnoses and all orders for this visit:   Muscle tension headache  I discussed some treatment options: Amitriptyline vs Baclofen. Because she is already on fluoxetine and Seroquel, I recommended starting baclofen. We discussed some side effects. Instructed about warning signs.       I don't think imaging is needed at this time but will consider next OV depending on HA changes. Follow-up in 4-6 weeks.  -     baclofen (LIORESAL) 10 MG tablet; Take 1 tablet (10 mg total) by mouth 3 (three) times  daily.  Cervicalgia   Local massage and OTC topical medications might help. Baclofen daily at bedtime, she can also take it during the day if needed but if she does not need to drive or engage in risky activities.  -     baclofen (LIORESAL) 10 MG tablet; Take 1 tablet (10 mg total) by mouth 3 (three) times daily.  Hypercalcemia  We discussed some possible causes, seems chronic. ? Vit D deficiency. Further recommendations will be given according to lab results.  -     Calcium, ionized -     PTH, Intact and Calcium -     VITAMIN D 25 Hydroxy (Vit-D Deficiency, Fractures)  Hepatitis C virus infection without hepatic coma, unspecified chronicity  She will like a provider closed to her home address, explained that we will try but not sure if this would be possible. LFT's 01/2016 in normal range.  -     Ambulatory referral to Infectious Disease  Pruritic erythematous rash  Chronic. ? Eczema. Topical steroid cream recommended, instructed to use it for 2 weeks bid and then as needed. Follow-up as needed.  -     triamcinolone cream (KENALOG) 0.1 %; Apply 1 application topically 2 (two) times daily. For 2 weeks then as needed.  Reactive airway disease that is not asthma  ? COPD. Albuterol inh 2 puff every 6 hours for a week then as needed for wheezing or shortness of breath.  Commonly recommended cessation. Next office visit, he had a ICS/LABA.  -     albuterol (PROVENTIL HFA;VENTOLIN HFA) 108 (90 Base) MCG/ACT inhaler; Inhale 2 puffs into the lungs every 6 (six) hours as needed for wheezing or shortness of breath.  Need for influenza vaccination -     Flu Vaccine QUAD 36+ mos IM        Mary G. [place], MD  [facility]. [place] office.

## 2016-04-12 NOTE — Patient Instructions (Addendum)
A few things to remember from today's visit:   Cervicalgia  Muscle tension headache  Hypercalcemia - Plan: Calcium, ionized, PTH, Intact and Calcium, VITAMIN D 25 Hydroxy (Vit-D Deficiency, Fractures)  Hepatitis C virus infection without hepatic coma, unspecified chronicity - Plan: Ambulatory referral to Infectious Disease Baclofen can cause drowsiness.  Topical Icy Hot with Lidocaine on upper back may help with pain.   Albuterol inh 2 puff every 6 hours for a week then as needed for wheezing or shortness of breath.  Smoking cessation encouraged.   Please be sure medication list is accurate. If a new problem present, please set up appointment sooner than planned today.

## 2016-04-12 NOTE — Progress Notes (Signed)
Pre visit review using our clinic review tool, if applicable. No additional management support is needed unless otherwise documented below in the visit note. 

## 2016-04-13 LAB — CALCIUM, IONIZED: CALCIUM ION: 6.4 mg/dL — AB (ref 4.8–5.6)

## 2016-04-13 LAB — PTH, INTACT AND CALCIUM
Calcium: 10.9 mg/dL — ABNORMAL HIGH (ref 8.6–10.4)
PTH: 122 pg/mL — ABNORMAL HIGH (ref 14–64)

## 2016-04-14 ENCOUNTER — Other Ambulatory Visit: Payer: Self-pay | Admitting: Obstetrics and Gynecology

## 2016-04-14 DIAGNOSIS — Z1231 Encounter for screening mammogram for malignant neoplasm of breast: Secondary | ICD-10-CM

## 2016-04-26 ENCOUNTER — Ambulatory Visit (INDEPENDENT_AMBULATORY_CARE_PROVIDER_SITE_OTHER): Payer: BLUE CROSS/BLUE SHIELD | Admitting: Internal Medicine

## 2016-04-26 ENCOUNTER — Encounter: Payer: Self-pay | Admitting: Internal Medicine

## 2016-04-26 VITALS — BP 89/43 | HR 87 | Temp 97.8°F | Wt 192.0 lb

## 2016-04-26 DIAGNOSIS — B182 Chronic viral hepatitis C: Secondary | ICD-10-CM | POA: Diagnosis not present

## 2016-04-26 DIAGNOSIS — Z23 Encounter for immunization: Secondary | ICD-10-CM

## 2016-04-26 LAB — PROTIME-INR
INR: 1.1
PROTHROMBIN TIME: 11.8 s — AB (ref 9.0–11.5)

## 2016-04-26 NOTE — Progress Notes (Signed)
[facility]   CC: consideration for treatment for chronic hepatitis C  HPI:  +Mary [name] is a 51 y.o. female who presents for initial evaluation and management of chronic hepatitis C.  Patient tested positive about 9 years ago while in jail. Hepatitis C-associated risk factors present are: IV drug abuse (details: last used over 10 years ago). Patient denies multiple sexual partners, renal dialysis. Patient has not had other studies performed. Results: none. Patient has not had prior treatment for Hepatitis C. Patient does not have a past history of liver disease. Patient does not have a family history of liver disease. Patient does not  have associated signs or symptoms related to liver disease.  Records reviewed from her PCP, no recent labs, had an ultrasound that did not suggest any liver issues.      Patient does not have documented immunity to Hepatitis A. Patient does not have documented immunity to Hepatitis B.    Review of Systems:  Constitutional: negative for fatigue and malaise Gastrointestinal: negative for diarrhea Integument/breast: negative for rash Musculoskeletal: negative for myalgias and arthralgias All other systems reviewed and are negative       Past Medical History:  Diagnosis Date  . Bipolar depression (HCC)   . Borderline personality disorder   . Depression   . Drug abuse and dependence (HCC)   . H/O hiatal hernia   . Hepatitis C   . Hernia   . Ileus, postoperative (HCC) 02/13/2013  . PTSD (post-traumatic stress disorder)   . Schizophrenia (HCC)     Prior to Admission medications   Medication Sig Start Date End Date Taking? Authorizing Provider  albuterol (PROVENTIL HFA;VENTOLIN HFA) 108 (90 Base) MCG/ACT inhaler Inhale 2 puffs into the lungs every 6 (six) hours as needed for wheezing or shortness of breath. 04/12/16  Yes Betty G [place], MD  baclofen (LIORESAL) 10 MG tablet Take 1 tablet (10 mg total) by mouth 3 (three)  times daily. 04/12/16  Yes Betty G [place], MD  omeprazole (PRILOSEC OTC) 20 MG tablet Take 20 mg by mouth daily as needed (for heartburn).   Yes Historical Provider, MD  pantoprazole (PROTONIX) 40 MG tablet Take 1 tablet (40 mg total) by mouth daily. 05/26/15  Yes Gwyneth Sprout, MD  QUEtiapine (SEROQUEL) 300 MG tablet Take 300 mg by mouth at bedtime.   Yes Historical Provider, MD  sertraline (ZOLOFT) 25 MG tablet Take 25 mg by mouth daily.   Yes Historical Provider, MD  sucralfate (CARAFATE) 1 g tablet Take 1 tablet (1 g total) by mouth 4 (four) times daily -  with meals and at bedtime. 01/27/16  Yes Danelle Berry, PA-C  triamcinolone cream (KENALOG) 0.1 % Apply 1 application topically 2 (two) times daily. For 2 weeks then as needed. 04/12/16  Yes Betty G [place], MD    No Known Allergies  Social History  Substance Use Topics  . Smoking status: Current Every Day Smoker    Packs/day: 0.50    Years: 20.00    Types: Cigarettes  . Smokeless tobacco: Never Used  . Alcohol use No     Comment: quit    Family History  Problem Relation Age of Onset  . Alcohol abuse Mother   . Alcohol abuse Father   thinks her sister died of complications of alcoholic cirrhosis   Objective:  Constitutional: in no apparent distress,  Vitals:   04/26/16 1009  BP: (!) 89/43  Pulse: 87  Temp: 97.8 F (36.6 C)  Eyes: anicteric Cardiovascular: Cor RRR Respiratory: CTA B; normal respiratory effort Gastrointestinal: Bowel sounds are normal, liver is not enlarged, spleen is not enlarged Musculoskeletal: no pedal edema noted Skin: negatives: no rash; no porphyria cutanea tarda Lymphatic: no cervical lymphadenopathy   Laboratory Genotype: No results found for: HCVGENOTYPE HCV viral load: No results found for: HCVQUANT Lab Results  Component Value Date   WBC 13.6 (H) 01/27/2016   HGB 14.0 01/27/2016   HCT 40.2 01/27/2016   MCV 88.9 01/27/2016   PLT 351 01/27/2016    Lab Results  Component Value Date    CREATININE 0.65 01/27/2016   BUN 11 01/27/2016   NA 136 01/27/2016   K 4.3 01/27/2016   CL 107 01/27/2016   CO2 25 01/27/2016    Lab Results  Component Value Date   ALT 18 01/27/2016   AST 17 01/27/2016   ALKPHOS 115 01/27/2016     Labs and history reviewed and show CHILD-PUGH A  5-6 points: Child class A 7-9 points: Child class B 10-15 points: Child class C  Lab Results  Component Value Date   INR 1.13 02/10/2013   BILITOT 0.5 01/27/2016   ALBUMIN 4.2 01/27/2016     Assessment: New Patient with Chronic Hepatitis C genotype unknown, untreated.  I discussed with the patient the lab findings that confirm chronic hepatitis C as well as the natural history and progression of disease including about 30% of people who develop cirrhosis of the liver if left untreated and once cirrhosis is established there is a 2-7% risk per year of liver cancer and liver failure.  I discussed the importance of treatment and benefits in reducing the risk, even if significant liver fibrosis exists.   Plan: 1) Patient counseled extensively on limiting acetaminophen to no more than 2 grams daily, avoidance of alcohol. 2) Transmission discussed with patient including sexual transmission, sharing razors and toothbrush.   3) Will need referral to gastroenterology if concern for cirrhosis 4) Will need referral for substance abuse counseling: No.; Further work up to include urine drug screen  No. 5) Will prescribe appropriate medication based on genotype and coverage  6) Hepatitis A and B titers 7) Pneumovax vaccine today 9) Further work up to include liver staging with elastography 10) will follow up after starting medication for intensive medication monitoring; she is on ppi so if possible will use Mayvret but can dose reduce if needed

## 2016-04-26 NOTE — Addendum Note (Signed)
Addended by: Lurlean LeydenPOOLE, Desmon Hitchner F on: 04/26/2016 03:28 PM   Modules accepted: Orders

## 2016-04-26 NOTE — Patient Instructions (Signed)
Date 04/26/16  Dear Ms Jean RosenthalJackson, As discussed in the ID Clinic, your hepatitis C therapy will include highly effective medication(s) for treatment and will vary based on the type of hepatitis C and insurance approval.  Potential medications include:          Harvoni (sofosbuvir 90mg /ledipasvir 400mg ) tablet oral daily          OR     Epclusa (sofosbuvir 400mg /velpatasvir 100mg ) tablet oral daily          OR      Mavyret (glecaprevir 100 mg/pibrentasvir 40 mg): Take 3 tablets oral daily          OR     Zepatier (elbasvir 50 mg/grazoprevir 100 mg) oral daily, +/- ribavirin              Medications are typically for 8 or 12 weeks total ---------------------------------------------------------------- Your HCV Treatment Start Date: You will be notified by our office once the medication is approved and where you can pick it up (or if mailed)   ---------------------------------------------------------------- YOUR PHARMACY CONTACT:   [facility] [address] [place], [place] [address] Phone: [phone] Hours: Monday to Friday 7:30 am to 6:00 pm   Please always contact your pharmacy at least 3-4 business days before you run out of medications to ensure your next month's medication is ready or 1 week prior to running out if you receive it by mail.  Remember, each prescription is for 28 days. ---------------------------------------------------------------- GENERAL NOTES REGARDING YOUR HEPATITIS C MEDICATION:  Some medications have the following interactions:  - Acid reducing agents such as H2 blockers (ie. Pepcid (famotidine), Zantac (ranitidine), Tagamet (cimetidine), Axid (nizatidine) and proton pump inhibitors (ie. Prilosec (omeprazole), Protonix (pantoprazole), Nexium (esomeprazole), or Aciphex (rabeprazole)). Do not take until you have discussed with a health care provider.    -Antacids that contain magnesium and/or aluminum hydroxide (ie. Milk of Magensia, Rolaids,  Gaviscon, Maalox, Mylanta, an dArthritis Pain Formula).  -Calcium carbonate (calcium supplements or antacids such as Tums, Caltrate, Os-Cal).  -St. John's wort or any products that contain St. John's wort like some herbal supplements  Please inform the office prior to starting any of these medications.  - The common side effects associated with Harvoni include:      1. Fatigue      2. Headache      3. Nausea      4. Diarrhea      5. Insomnia  Please note that this only lists the most common side effects and is NOT a comprehensive list of the potential side effects of these medications. For more information, please review the drug information sheets that come with your medication package from the pharmacy.  ---------------------------------------------------------------- GENERAL HELPFUL HINTS ON HCV THERAPY: 1. Stay well-hydrated. 2. Notify the [facility] Clinic of any changes in your other over-the-counter/herbal or prescription medications. 3. If you miss a dose of your medication, take the missed dose as soon as you remember. Return to your regular time/dose schedule the next day.  4.  Do not stop taking your medications without first talking with your healthcare provider. 5.  You may take Tylenol (acetaminophen), as long as the dose is less than 2000 mg (OR no more than 4 tablets of the Tylenol Extra Strengths 500mg  tablet) in 24 hours. 6.  You will see our pharmacist-specialist within the first 2 weeks of starting your medication to monitor for any possible side effects. 7.  You will have labs once during treatment, soon  after treatment completion and one final lab 6 months after treatment completion to verify the virus is out of your system.  Scharlene Gloss, [place] for [place], [place]  [phone]

## 2016-04-27 LAB — HEPATITIS B SURFACE ANTIBODY,QUALITATIVE

## 2016-04-27 LAB — HEPATITIS B SURFACE ANTIGEN: Hepatitis B Surface Ag: NEGATIVE

## 2016-04-27 LAB — HEPATITIS B CORE ANTIBODY, TOTAL: Hep B Core Total Ab: REACTIVE — AB

## 2016-04-27 LAB — HEPATITIS A ANTIBODY, TOTAL: Hep A Total Ab: REACTIVE — AB

## 2016-04-27 LAB — HIV ANTIBODY (ROUTINE TESTING W REFLEX): HIV: NONREACTIVE

## 2016-04-29 LAB — LIVER FIBROSIS, FIBROTEST-ACTITEST
ALT: 13 U/L (ref 6–29)
Alpha-2-Macroglobulin: 220 mg/dL (ref 106–279)
Apolipoprotein A1: 166 mg/dL (ref 101–198)
Bilirubin: 0.4 mg/dL (ref 0.2–1.2)
Fibrosis Score: 0.15
GGT: 11 U/L (ref 3–70)
Haptoglobin: 62 mg/dL (ref 43–212)
Necroinflammat ACT Score: 0.03
Reference ID: 1822264

## 2016-05-01 LAB — HCV RNA, QN PCR RFLX GENO, LIPA: HCV RNA, PCR, QN: <15 IU/mL

## 2016-05-02 ENCOUNTER — Ambulatory Visit
Admission: RE | Admit: 2016-05-02 | Discharge: 2016-05-02 | Disposition: A | Discharge status: Home / Self Care | Payer: Self-pay | Source: Ambulatory Visit | Attending: Obstetrics and Gynecology | Admitting: Obstetrics and Gynecology

## 2016-05-02 ENCOUNTER — Other Ambulatory Visit: Payer: Self-pay

## 2016-05-02 ENCOUNTER — Encounter (HOSPITAL_COMMUNITY): Payer: Self-pay

## 2016-05-02 ENCOUNTER — Ambulatory Visit (HOSPITAL_COMMUNITY)
Admission: RE | Admit: 2016-05-02 | Discharge: 2016-05-02 | Disposition: A | Discharge status: Home / Self Care | Payer: Self-pay | Source: Ambulatory Visit | Attending: Obstetrics and Gynecology | Admitting: Obstetrics and Gynecology

## 2016-05-02 VITALS — BP 114/70 | Temp 98.8°F | Ht 64.5 in | Wt 190.4 lb

## 2016-05-02 DIAGNOSIS — E210 Primary hyperparathyroidism: Secondary | ICD-10-CM

## 2016-05-02 DIAGNOSIS — Z01419 Encounter for gynecological examination (general) (routine) without abnormal findings: Secondary | ICD-10-CM | POA: Diagnosis not present

## 2016-05-02 DIAGNOSIS — Z1231 Encounter for screening mammogram for malignant neoplasm of breast: Secondary | ICD-10-CM

## 2016-05-02 NOTE — Patient Instructions (Signed)
Explained breast self awareness with Mary [name]. Let patient know [facility] will cover Pap smears and HPV typing every 5 years unless has a history of abnormal Pap smears. [facility]. Appointment scheduled for Tuesday, May 02, 2016 at 1610. Let patient know will follow up with her within the next couple weeks with results of Pap smear by phone. Informed patient that the Breast Center will follow up with her within the next couple of weeks with results of mammogram by letter or phone. Mary FurlongDianne [name] verbalized understanding.  Mary [name], Kathaleen Maserhristine Poll, RN 4:26 PM

## 2016-05-02 NOTE — Progress Notes (Signed)
No complaints today.   Pap Smear: Pap smear completed today. Last Pap smear was 10 years ago and normal per patient. Per patient has no history of an abnormal Pap smear. No Pap smear results are in EPIC.  Physical exam: Breasts Breasts symmetrical. No skin abnormalities bilateral breasts. No nipple retraction bilateral breasts. No nipple discharge bilateral breasts. No lymphadenopathy. No lumps palpated bilateral breasts. No complaints of pain or tenderness on exam. [facility] for [facility] screening mamm[facility]. Appointment scheduled for Tuesday, May 02, 2016 at 1610.  Pelvic/Bimanual   Ext Genitalia No lesions, no swelling and no discharge observed on external genitalia.         Vagina Vagina pink and normal texture. No lesions or discharge observed in vagina.          Cervix Cervix is present. Cervix pink and of normal texture. No discharge observed.     Uterus Uterus is present and palpable. Uterus in normal position and normal size.        Adnexae Bilateral ovaries present and palpable. No tenderness on palpation.          Rectovaginal No rectal exam completed today since patient had no rectal complaints. No skin abnormalities observed on exam.    Smoking History: Patient is a former smoker. Per patient quit 16 days ago.  Patient Navigation: Patient education provided. Access to services provided for patient through [facility] program.   Colorectal Cancer Screening: Per patient has never had a colonoscopy completed. No complaints today. FIT Test given to patient to complete and return to [facility].

## 2016-05-03 ENCOUNTER — Telehealth: Payer: Self-pay | Admitting: *Deleted

## 2016-05-03 NOTE — Telephone Encounter (Signed)
Relayed results to patient, cancelled elastography, updated pharmacy.  Patient is elated!!  Andree CossHowell, Shayda Kalka M, RN

## 2016-05-03 NOTE — Telephone Encounter (Addendum)
  Gardiner Barefootobert W Comer, MD  P [facility] Triage Nurse Pool        See previous message, can cancel her elastography as well. thanks     ----- Message from Gardiner Barefootobert W Comer, MD sent at 05/02/2016  3:40 PM EST ----- Please let her know that her results came back negative for hepatitis C, she does not have it.  thanks

## 2016-05-04 LAB — CYTOLOGY - PAP
Diagnosis: UNDETERMINED — AB
HPV: NOT DETECTED

## 2016-05-05 ENCOUNTER — Encounter (HOSPITAL_COMMUNITY): Payer: Self-pay | Admitting: *Deleted

## 2016-05-05 ENCOUNTER — Other Ambulatory Visit (HOSPITAL_COMMUNITY): Payer: Self-pay | Admitting: *Deleted

## 2016-05-10 ENCOUNTER — Other Ambulatory Visit: Payer: Self-pay

## 2016-05-10 ENCOUNTER — Ambulatory Visit (HOSPITAL_COMMUNITY): Payer: BLUE CROSS/BLUE SHIELD

## 2016-05-12 ENCOUNTER — Telehealth (HOSPITAL_COMMUNITY): Payer: Self-pay | Admitting: *Deleted

## 2016-05-12 LAB — FECAL OCCULT BLOOD, IMMUNOCHEMICAL: FECAL OCCULT BLD: NEGATIVE

## 2016-05-12 LAB — TEST CODE CHANGE

## 2016-05-12 NOTE — Telephone Encounter (Signed)
Telephoned patient at home number and advised patient of abnormal cells on pap smear. HPV was normal. Advised patient recommendation is next pap smear in one year. Advised patient Fit Test was negative. Patient voiced understanding.

## 2016-05-25 ENCOUNTER — Encounter: Payer: Self-pay | Admitting: Endocrinology

## 2016-05-25 ENCOUNTER — Ambulatory Visit (INDEPENDENT_AMBULATORY_CARE_PROVIDER_SITE_OTHER): Payer: BLUE CROSS/BLUE SHIELD | Admitting: Endocrinology

## 2016-05-25 VITALS — BP 128/74 | HR 89 | Ht 65.0 in | Wt 189.0 lb

## 2016-05-25 DIAGNOSIS — E559 Vitamin D deficiency, unspecified: Secondary | ICD-10-CM

## 2016-05-25 DIAGNOSIS — E213 Hyperparathyroidism, unspecified: Secondary | ICD-10-CM | POA: Diagnosis not present

## 2016-05-25 NOTE — Patient Instructions (Signed)
Vitamin D3 2000 units a day

## 2016-05-25 NOTE — Progress Notes (Signed)
Patient ID: Mary FurlongDianne [name], female   DOB: 02/10/1966, 51 y.o.   MRN: [id number]            Chief complaint: High calcium  History of Present Illness:  Referring physician: Betty [place]   Review of records show that she has had a high calcium since at least 2014, had high normal level in 2013. She was not aware of this until she was seen by her PCP in 1/18   Lab Results  Component Value Date   CALCIUM 10.9 (H) 04/12/2016   CALCIUM 10.4 (H) 01/27/2016   CALCIUM 11.0 (H) 09/03/2015   CALCIUM 10.9 (H) 07/25/2015   CALCIUM 10.6 (H) 03/13/2015   CALCIUM 10.7 (H) 10/23/2014   CALCIUM 9.6 02/14/2013   CALCIUM 10.3 02/11/2013   CALCIUM 11.0 (H) 02/10/2013    The hypercalcemia is not associated with any pathologic fractures, renal insufficiency, History of kidney stones, sarcoidosis, known carcinoma, hyperthyroidism.   Prior serologic and radiologic studies have included:  Lab Results  Component Value Date   PTH 122 (H) 04/12/2016   CALCIUM 10.9 (H) 04/12/2016   CAION 6.4 (H) 04/12/2016      25 (OH) Vitamin D level:    Lab Results  Component Value Date   VD25OH 16.06 (L) 04/12/2016   She is not on any vitamin D supplementation, also not taking any calcium supplements  She has not had a bone density     Allergies as of 05/25/2016   No Known Allergies     Medication List       Accurate as of 05/25/16 11:59 PM. Always use your most recent med list.          albuterol 108 (90 Base) MCG/ACT inhaler Commonly known as:  PROVENTIL HFA;VENTOLIN HFA Inhale 2 puffs into the lungs every 6 (six) hours as needed for wheezing or shortness of breath.   baclofen 10 MG tablet Commonly known as:  LIORESAL Take 1 tablet (10 mg total) by mouth 3 (three) times daily.   omeprazole 20 MG tablet Commonly known as:  PRILOSEC OTC Take 20 mg by mouth daily as needed (for heartburn).   pantoprazole 40 MG tablet Commonly known as:  PROTONIX Take 1 tablet (40 mg total) by mouth  daily.   QUEtiapine 300 MG tablet Commonly known as:  SEROQUEL Take 300 mg by mouth at bedtime.   sertraline 25 MG tablet Commonly known as:  ZOLOFT Take 25 mg by mouth daily.   sucralfate 1 g tablet Commonly known as:  CARAFATE Take 1 tablet (1 g total) by mouth 4 (four) times daily -  with meals and at bedtime.   triamcinolone cream 0.1 % Commonly known as:  KENALOG Apply 1 application topically 2 (two) times daily. For 2 weeks then as needed.       Allergies: No Known Allergies  Past Medical History:  Diagnosis Date  . Bipolar depression (HCC)   . Borderline personality disorder   . Depression   . Drug abuse and dependence (HCC)   . H/O hiatal hernia   . Hepatitis C   . Hernia   . Ileus, postoperative (HCC) 02/13/2013  . PTSD (post-traumatic stress disorder)   . Schizophrenia [facility](HCC)     Past Surgical History:  Procedure Laterality Date  . DIAGNOSTIC LAPAROSCOPIC LIVER BIOPSY N/A 02/10/2013   Procedure: DIAGNOSTIC LAPAROSCOPIC ;  Surgeon: Ardeth SportsmanSteven C. Gross, MD;  Location: [facility];  Service: General;  Laterality: N/A;  DIAGNOSTIC LAPAROSCOPY,laparoscopic ventral hernia repair with mesh lysis  of adhesions  . HERNIA REPAIR    . SPLENECTOMY    . TUBAL LIGATION      Family History  Problem Relation Age of Onset  . Alcohol abuse Mother   . Alcohol abuse Father   . Cancer Maternal Grandmother     colon  . Diabetes Maternal Grandmother   . Thyroid disease Neg Hx     Social History:  reports that she quit smoking about 6 weeks ago. Her smoking use included Cigarettes. She has a 10.00 pack-year smoking history. She has never used smokeless tobacco. She reports that she does not drink alcohol or use drugs.  Review of Systems  Constitutional: Negative for weight loss.  HENT: Positive for headaches.   Eyes: Negative for blurred vision.  Respiratory: Negative for cough.   Cardiovascular: Negative for chest pain and leg swelling.  Gastrointestinal: Positive for nausea.   Endocrine: Positive for menstrual changes. Negative for abnormal weight gain.       Has only a couple of menstrual cycles in the last year, relatively light now.  Has had hot flashes for about a year, not bothered excessively  Genitourinary: Negative for frequency.  Musculoskeletal: Negative for muscle cramps.  Skin: Positive for rash.  Neurological: Negative for weakness.  Psychiatric/Behavioral:       She has a long-standing depression, controlled now     EXAM:  BP 128/74   Pulse 89   Ht 5\' 5"  (1.651 m)   Wt 189 lb (85.7 kg)   SpO2 95%   BMI 31.45 kg/m   GENERAL: Averagely built, has mild generalized obesity  No pallor, clubbing, lymphadenopathy or edema.    Skin:  She has a patch of skin is slightly erythematous rash on her right lower leg anteriorly  EYES:  Externally normal.  Fundii:  normal discs and vessels.  ENT: Oral mucosa and tongue normal.  THYROID:  Not palpable.  No other mass palpable in the neck  HEART:  Normal  S1 and S2; no murmur or click.  CHEST:  Normal shape Lungs:   Vescicular breath sounds heard equally.  No crepitations/ wheeze.  ABDOMEN:  No distention.  Liver and spleen not palpable.   No other mass or tenderness.  NEUROLOGICAL: .Reflexes are normal bilaterally at biceps.  SPINE AND JOINTS:  Normal.  Assessment/Plan:   HYPERCALCEMIA: This is long-standing She is asymptomatic with mild increase in calcium since at least 2014, last level is 10.9 She is not on any medications or supplements to cause hypercalcemia  Discussed the nature of primary hyperparathyroidism as well as normal role of the parathyroid glands. Discussed potential  effects of hyperparathyroidism long-term on bone health, kidney stones and kidney function Explained to patient that surgery is indicated only there are symptoms of high calcium, calcium level over 1 point above the normal range or known osteoporosis.  Also usually not indicated in asymptomatic individuals  over age 18  For now she does need to have a bone density to assess any end organ effects of the hyperparathyroidism If she does not have any decrease in bone density she can have periodic follow-up every 3 years  VITAMIN D deficiency: She has a significantly low vitamin D level below 20 Recommended that she start vitamin D3 2000 units a day for general bone health and this may also reduce potential for secondary hyperparathyroidism  Rash on leg: May have psoriasis, advised her to discuss with PCP   [facility] 05/26/2016, 8:14 AM

## 2016-05-26 ENCOUNTER — Encounter: Payer: Self-pay | Admitting: Endocrinology

## 2016-06-02 ENCOUNTER — Ambulatory Visit (INDEPENDENT_AMBULATORY_CARE_PROVIDER_SITE_OTHER)
Admission: RE | Admit: 2016-06-02 | Discharge: 2016-06-02 | Disposition: A | Discharge status: Home / Self Care | Payer: BLUE CROSS/BLUE SHIELD | Source: Ambulatory Visit | Attending: Endocrinology | Admitting: Endocrinology

## 2016-06-02 ENCOUNTER — Encounter: Payer: Self-pay | Admitting: Endocrinology

## 2016-06-02 DIAGNOSIS — E213 Hyperparathyroidism, unspecified: Secondary | ICD-10-CM | POA: Diagnosis not present

## 2016-06-10 DIAGNOSIS — E213 Hyperparathyroidism, unspecified: Secondary | ICD-10-CM | POA: Diagnosis not present

## 2016-06-15 ENCOUNTER — Ambulatory Visit: Payer: Self-pay | Admitting: Nurse Practitioner

## 2016-06-27 ENCOUNTER — Emergency Department (HOSPITAL_COMMUNITY): Payer: BLUE CROSS/BLUE SHIELD

## 2016-06-27 ENCOUNTER — Encounter (HOSPITAL_COMMUNITY): Payer: Self-pay | Admitting: Emergency Medicine

## 2016-06-27 ENCOUNTER — Observation Stay (HOSPITAL_COMMUNITY)
Admission: EM | Admit: 2016-06-27 | Discharge: 2016-06-28 | Disposition: A | Discharge status: Home / Self Care | Payer: BLUE CROSS/BLUE SHIELD | Attending: Urology | Admitting: Urology

## 2016-06-27 DIAGNOSIS — R112 Nausea with vomiting, unspecified: Secondary | ICD-10-CM | POA: Diagnosis not present

## 2016-06-27 DIAGNOSIS — R1031 Right lower quadrant pain: Secondary | ICD-10-CM | POA: Diagnosis not present

## 2016-06-27 DIAGNOSIS — D72829 Elevated white blood cell count, unspecified: Secondary | ICD-10-CM | POA: Diagnosis not present

## 2016-06-27 DIAGNOSIS — E669 Obesity, unspecified: Secondary | ICD-10-CM | POA: Insufficient documentation

## 2016-06-27 DIAGNOSIS — F329 Major depressive disorder, single episode, unspecified: Secondary | ICD-10-CM | POA: Diagnosis not present

## 2016-06-27 DIAGNOSIS — Z7951 Long term (current) use of inhaled steroids: Secondary | ICD-10-CM | POA: Insufficient documentation

## 2016-06-27 DIAGNOSIS — F4310 Post-traumatic stress disorder, unspecified: Secondary | ICD-10-CM | POA: Insufficient documentation

## 2016-06-27 DIAGNOSIS — Z79899 Other long term (current) drug therapy: Secondary | ICD-10-CM | POA: Diagnosis not present

## 2016-06-27 DIAGNOSIS — N132 Hydronephrosis with renal and ureteral calculous obstruction: Secondary | ICD-10-CM | POA: Diagnosis not present

## 2016-06-27 DIAGNOSIS — K219 Gastro-esophageal reflux disease without esophagitis: Secondary | ICD-10-CM | POA: Insufficient documentation

## 2016-06-27 DIAGNOSIS — R109 Unspecified abdominal pain: Secondary | ICD-10-CM

## 2016-06-27 DIAGNOSIS — F209 Schizophrenia, unspecified: Secondary | ICD-10-CM | POA: Diagnosis not present

## 2016-06-27 DIAGNOSIS — B182 Chronic viral hepatitis C: Secondary | ICD-10-CM | POA: Diagnosis not present

## 2016-06-27 DIAGNOSIS — Z87891 Personal history of nicotine dependence: Secondary | ICD-10-CM | POA: Insufficient documentation

## 2016-06-27 DIAGNOSIS — F319 Bipolar disorder, unspecified: Secondary | ICD-10-CM | POA: Insufficient documentation

## 2016-06-27 DIAGNOSIS — N201 Calculus of ureter: Secondary | ICD-10-CM | POA: Diagnosis present

## 2016-06-27 DIAGNOSIS — N23 Unspecified renal colic: Secondary | ICD-10-CM

## 2016-06-27 LAB — CBC
HCT: 39.4 % (ref 36.0–46.0)
HEMOGLOBIN: 13.3 g/dL (ref 12.0–15.0)
MCH: 30.2 pg (ref 26.0–34.0)
MCHC: 33.8 g/dL (ref 30.0–36.0)
MCV: 89.3 fL (ref 78.0–100.0)
Platelets: 332 10*3/uL (ref 150–400)
RBC: 4.41 MIL/uL (ref 3.87–5.11)
RDW: 13.4 % (ref 11.5–15.5)
WBC: 12.8 10*3/uL — ABNORMAL HIGH (ref 4.0–10.5)

## 2016-06-27 LAB — COMPREHENSIVE METABOLIC PANEL
ALBUMIN: 4 g/dL (ref 3.5–5.0)
ALT: 19 U/L (ref 14–54)
AST: 22 U/L (ref 15–41)
Alkaline Phosphatase: 101 U/L (ref 38–126)
Anion gap: 5 (ref 5–15)
BUN: 14 mg/dL (ref 6–20)
CHLORIDE: 106 mmol/L (ref 101–111)
CO2: 26 mmol/L (ref 22–32)
Calcium: 10.6 mg/dL — ABNORMAL HIGH (ref 8.9–10.3)
Creatinine, Ser: 0.81 mg/dL (ref 0.44–1.00)
GFR calc Af Amer: >60 mL/min (ref >60)
Glucose, Bld: 115 mg/dL — ABNORMAL HIGH (ref 65–99)
POTASSIUM: 4.2 mmol/L (ref 3.5–5.1)
SODIUM: 137 mmol/L (ref 135–145)
Total Bilirubin: 0.5 mg/dL (ref 0.3–1.2)
Total Protein: 7 g/dL (ref 6.5–8.1)

## 2016-06-27 LAB — URINALYSIS, ROUTINE W REFLEX MICROSCOPIC
Bilirubin Urine: NEGATIVE
GLUCOSE, UA: NEGATIVE mg/dL
Ketones, ur: NEGATIVE mg/dL
Leukocytes, UA: NEGATIVE
NITRITE: NEGATIVE
Protein, ur: NEGATIVE mg/dL
SPECIFIC GRAVITY, URINE: 1.03 (ref 1.005–1.030)
pH: 5 (ref 5.0–8.0)

## 2016-06-27 LAB — LIPASE, BLOOD: LIPASE: 17 U/L (ref 11–51)

## 2016-06-27 LAB — POC URINE PREG, ED: Preg Test, Ur: NEGATIVE

## 2016-06-27 MED ORDER — MORPHINE SULFATE (PF) 4 MG/ML IV SOLN
4.0000 mg | Freq: Once | INTRAVENOUS | Status: AC
  Administered 2016-06-27: 4 mg via INTRAVENOUS
  Filled 2016-06-27: qty 1

## 2016-06-27 MED ORDER — ONDANSETRON HCL 4 MG/2ML IJ SOLN
4.0000 mg | Freq: Once | INTRAMUSCULAR | Status: AC
  Administered 2016-06-27: 4 mg via INTRAVENOUS
  Filled 2016-06-27: qty 2

## 2016-06-27 MED ORDER — KETOROLAC TROMETHAMINE 30 MG/ML IJ SOLN
60.0000 mg | Freq: Once | INTRAMUSCULAR | Status: DC
Start: 2016-06-27 — End: 2016-06-27
  Filled 2016-06-27: qty 2

## 2016-06-27 MED ORDER — LIDOCAINE HCL (PF) 1 % IJ SOLN
INTRAMUSCULAR | Status: AC
  Filled 2016-06-27: qty 5

## 2016-06-27 MED ORDER — KETOROLAC TROMETHAMINE 30 MG/ML IJ SOLN
30.0000 mg | Freq: Once | INTRAMUSCULAR | Status: AC
  Administered 2016-06-27: 30 mg via INTRAVENOUS

## 2016-06-27 MED ORDER — MORPHINE SULFATE (PF) 4 MG/ML IV SOLN: 2.0000 mg | Freq: Once | INTRAVENOUS | Status: DC

## 2016-06-27 MED ORDER — ONDANSETRON HCL 4 MG/2ML IJ SOLN
4.0000 mg | Freq: Once | INTRAMUSCULAR | Status: AC | PRN
  Administered 2016-06-27: 4 mg via INTRAVENOUS
  Filled 2016-06-27: qty 2

## 2016-06-27 MED ORDER — MORPHINE SULFATE (PF) 4 MG/ML IV SOLN
4.0000 mg | Freq: Once | INTRAVENOUS | Status: AC
Start: 2016-06-27 — End: 2016-06-27
  Administered 2016-06-27: 4 mg via INTRAVENOUS
  Filled 2016-06-27: qty 1

## 2016-06-27 MED ORDER — SODIUM CHLORIDE 0.9 % IV BOLUS (SEPSIS)
1000.0000 mL | Freq: Once | INTRAVENOUS | Status: AC
  Administered 2016-06-27: 1000 mL via INTRAVENOUS

## 2016-06-27 MED ORDER — HYDROMORPHONE HCL 1 MG/ML IJ SOLN
1.0000 mg | Freq: Once | INTRAMUSCULAR | Status: AC
  Administered 2016-06-27: 1 mg via INTRAVENOUS
  Filled 2016-06-27: qty 1

## 2016-06-27 MED ORDER — HYDROMORPHONE HCL 1 MG/ML IJ SOLN
1.0000 mg | Freq: Once | INTRAMUSCULAR | Status: DC
  Filled 2016-06-27: qty 1

## 2016-06-27 MED ORDER — METOCLOPRAMIDE HCL 5 MG/ML IJ SOLN
10.0000 mg | Freq: Once | INTRAMUSCULAR | Status: AC
  Administered 2016-06-27: 10 mg via INTRAVENOUS
  Filled 2016-06-27: qty 2

## 2016-06-27 NOTE — ED Notes (Signed)
Pt stating that pain is worse. Dahlia Client - RN aware.

## 2016-06-27 NOTE — ED Notes (Signed)
[facility] contacted for tx to [facility]
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
## 2016-06-27 NOTE — ED Provider Notes (Addendum)
Complains of right flank pain radiating to right lower quadrant onset 2 PM today. Nothing makes symptoms better or worse. Pain somewhat improved after treatment with morphine here. On exam alert no distress abdomen obese, soft nontender. No flank tenderness.   Doug Sou, MD 06/27/16 1815 9:30 PM pain not well controlled after additional intravenous hydromorphone. She continues to vomit after intravenous Zofran admits her. IV Reglan ordered. I consulted Dr.Mckenzie from urology service to request transfer to [facility] long emergency department. Dr. Freida Busman, EDP was notified of patient's arrival   Doug Sou, MD 06/27/16 2140 11:50 PM patient actively retching however refuses additional antiemetic medication. She rates her pain a 7 on scale of 1-10 however refuses additional analgesic.   Doug Sou, MD 06/27/16 [phone]

## 2016-06-27 NOTE — ED Triage Notes (Signed)
Pt sts right sided abd and back pain x 1 hour

## 2016-06-27 NOTE — ED Notes (Signed)
Pt aware that urine sample is needed, but is unable to provide one at this time 

## 2016-06-27 NOTE — ED Notes (Signed)
ED Provider at bedside. 

## 2016-06-28 ENCOUNTER — Emergency Department (HOSPITAL_COMMUNITY): Payer: BLUE CROSS/BLUE SHIELD | Admitting: Anesthesiology

## 2016-06-28 ENCOUNTER — Encounter (HOSPITAL_COMMUNITY)
Admission: EM | Disposition: A | Discharge status: Home / Self Care | Payer: Self-pay | Source: Home / Self Care | Attending: Emergency Medicine

## 2016-06-28 ENCOUNTER — Encounter (HOSPITAL_COMMUNITY): Payer: Self-pay | Admitting: Anesthesiology

## 2016-06-28 ENCOUNTER — Emergency Department (HOSPITAL_COMMUNITY): Payer: BLUE CROSS/BLUE SHIELD

## 2016-06-28 DIAGNOSIS — D72829 Elevated white blood cell count, unspecified: Secondary | ICD-10-CM | POA: Diagnosis not present

## 2016-06-28 DIAGNOSIS — R1031 Right lower quadrant pain: Secondary | ICD-10-CM | POA: Diagnosis not present

## 2016-06-28 DIAGNOSIS — N201 Calculus of ureter: Secondary | ICD-10-CM | POA: Diagnosis not present

## 2016-06-28 DIAGNOSIS — N132 Hydronephrosis with renal and ureteral calculous obstruction: Secondary | ICD-10-CM | POA: Diagnosis not present

## 2016-06-28 DIAGNOSIS — R112 Nausea with vomiting, unspecified: Secondary | ICD-10-CM | POA: Diagnosis not present

## 2016-06-28 DIAGNOSIS — Z466 Encounter for fitting and adjustment of urinary device: Secondary | ICD-10-CM | POA: Diagnosis not present

## 2016-06-28 HISTORY — PX: CYSTOSCOPY WITH RETROGRADE PYELOGRAM, URETEROSCOPY AND STENT PLACEMENT: SHX5789

## 2016-06-28 SURGERY — CYSTOURETEROSCOPY, WITH RETROGRADE PYELOGRAM AND STENT INSERTION
Anesthesia: General | Site: Ureter | Laterality: Right

## 2016-06-28 SURGERY — Surgical Case
Anesthesia: *Unknown

## 2016-06-28 MED ORDER — ONDANSETRON HCL 4 MG/2ML IJ SOLN
INTRAMUSCULAR | Status: DC | PRN
  Administered 2016-06-28: 4 mg via INTRAVENOUS

## 2016-06-28 MED ORDER — ONDANSETRON HCL 4 MG/2ML IJ SOLN
4.0000 mg | INTRAMUSCULAR | Status: DC | PRN
  Administered 2016-06-28: 4 mg via INTRAVENOUS
  Filled 2016-06-28: qty 2

## 2016-06-28 MED ORDER — LIDOCAINE 2% (20 MG/ML) 5 ML SYRINGE
INTRAMUSCULAR | Status: DC | PRN
  Administered 2016-06-28: 50 mg via INTRAVENOUS

## 2016-06-28 MED ORDER — HYDROMORPHONE HCL 2 MG/ML IJ SOLN: 0.5000 mg | INTRAMUSCULAR | Status: DC | PRN

## 2016-06-28 MED ORDER — PROMETHAZINE HCL 25 MG/ML IJ SOLN: 6.2500 mg | Freq: Once | INTRAMUSCULAR | Status: DC

## 2016-06-28 MED ORDER — ONDANSETRON HCL 4 MG/2ML IJ SOLN
INTRAMUSCULAR | Status: AC
  Filled 2016-06-28: qty 2

## 2016-06-28 MED ORDER — CEFAZOLIN SODIUM-DEXTROSE 2-4 GM/100ML-% IV SOLN
INTRAVENOUS | Status: AC
  Filled 2016-06-28: qty 100

## 2016-06-28 MED ORDER — PROMETHAZINE HCL 25 MG/ML IJ SOLN
6.2500 mg | Freq: Once | INTRAMUSCULAR | Status: AC
  Administered 2016-06-28: 6.25 mg via INTRAVENOUS
  Filled 2016-06-28: qty 1

## 2016-06-28 MED ORDER — PROPOFOL 10 MG/ML IV BOLUS
INTRAVENOUS | Status: DC | PRN
  Administered 2016-06-28: 130 mg via INTRAVENOUS

## 2016-06-28 MED ORDER — SUCCINYLCHOLINE CHLORIDE 200 MG/10ML IV SOSY
PREFILLED_SYRINGE | INTRAVENOUS | Status: DC | PRN
  Administered 2016-06-28: 120 mg via INTRAVENOUS

## 2016-06-28 MED ORDER — SODIUM CHLORIDE 0.9 % IV SOLN: 250.0000 mL | INTRAVENOUS | Status: DC | PRN

## 2016-06-28 MED ORDER — SODIUM CHLORIDE 0.9% FLUSH: 3.0000 mL | INTRAVENOUS | Status: DC | PRN

## 2016-06-28 MED ORDER — PANTOPRAZOLE SODIUM 40 MG PO TBEC
40.0000 mg | DELAYED_RELEASE_TABLET | Freq: Every day | ORAL | Status: DC
  Administered 2016-06-28: 40 mg via ORAL
  Filled 2016-06-28: qty 1

## 2016-06-28 MED ORDER — SCOPOLAMINE 1 MG/3DAYS TD PT72
MEDICATED_PATCH | TRANSDERMAL | Status: DC | PRN
  Administered 2016-06-28: 1 via TRANSDERMAL

## 2016-06-28 MED ORDER — FENTANYL CITRATE (PF) 100 MCG/2ML IJ SOLN
INTRAMUSCULAR | Status: DC | PRN
  Administered 2016-06-28: 100 ug via INTRAVENOUS

## 2016-06-28 MED ORDER — METOCLOPRAMIDE HCL 5 MG/ML IJ SOLN: 10.0000 mg | Freq: Once | INTRAMUSCULAR | Status: DC | PRN

## 2016-06-28 MED ORDER — PROMETHAZINE HCL 25 MG/ML IJ SOLN
INTRAMUSCULAR | Status: AC
  Filled 2016-06-28: qty 1

## 2016-06-28 MED ORDER — ALBUTEROL SULFATE (2.5 MG/3ML) 0.083% IN NEBU: 3.0000 mL | INHALATION_SOLUTION | Freq: Four times a day (QID) | RESPIRATORY_TRACT | Status: DC | PRN

## 2016-06-28 MED ORDER — SCOPOLAMINE 1 MG/3DAYS TD PT72
MEDICATED_PATCH | TRANSDERMAL | Status: AC
  Filled 2016-06-28: qty 1

## 2016-06-28 MED ORDER — OXYCODONE-ACETAMINOPHEN 5-325 MG PO TABS: 1.0000 | ORAL_TABLET | ORAL | 0 refills | Status: DC | PRN

## 2016-06-28 MED ORDER — ZOLPIDEM TARTRATE 5 MG PO TABS: 5.0000 mg | ORAL_TABLET | Freq: Every evening | ORAL | Status: DC | PRN

## 2016-06-28 MED ORDER — STERILE WATER FOR IRRIGATION IR SOLN
Status: DC | PRN
  Administered 2016-06-28: 2000 mL

## 2016-06-28 MED ORDER — LACTATED RINGERS IV SOLN: INTRAVENOUS | Status: DC

## 2016-06-28 MED ORDER — LIDOCAINE 2% (20 MG/ML) 5 ML SYRINGE
INTRAMUSCULAR | Status: AC
  Filled 2016-06-28: qty 5

## 2016-06-28 MED ORDER — SUCRALFATE 1 G PO TABS
1.0000 g | ORAL_TABLET | Freq: Three times a day (TID) | ORAL | Status: DC
  Administered 2016-06-28 (×2): 1 g via ORAL
  Filled 2016-06-28 (×2): qty 1

## 2016-06-28 MED ORDER — DEXAMETHASONE SODIUM PHOSPHATE 10 MG/ML IJ SOLN
INTRAMUSCULAR | Status: AC
  Filled 2016-06-28: qty 1

## 2016-06-28 MED ORDER — PROPOFOL 10 MG/ML IV BOLUS
INTRAVENOUS | Status: AC
  Filled 2016-06-28: qty 20

## 2016-06-28 MED ORDER — LACTATED RINGERS IV SOLN
INTRAVENOUS | Status: DC | PRN
  Administered 2016-06-28: via INTRAVENOUS

## 2016-06-28 MED ORDER — SODIUM CHLORIDE 0.9% FLUSH: 3.0000 mL | Freq: Two times a day (BID) | INTRAVENOUS | Status: DC

## 2016-06-28 MED ORDER — MEPERIDINE HCL 50 MG/ML IJ SOLN: 6.2500 mg | INTRAMUSCULAR | Status: DC | PRN

## 2016-06-28 MED ORDER — DIPHENHYDRAMINE HCL 12.5 MG/5ML PO ELIX: 12.5000 mg | ORAL_SOLUTION | Freq: Four times a day (QID) | ORAL | Status: DC | PRN

## 2016-06-28 MED ORDER — CEFAZOLIN SODIUM-DEXTROSE 2-3 GM-% IV SOLR
INTRAVENOUS | Status: DC | PRN
  Administered 2016-06-28: 2 g via INTRAVENOUS

## 2016-06-28 MED ORDER — QUETIAPINE FUMARATE 300 MG PO TABS: 300.0000 mg | ORAL_TABLET | Freq: Every day | ORAL | Status: DC

## 2016-06-28 MED ORDER — OXYCODONE-ACETAMINOPHEN 5-325 MG PO TABS: 1.0000 | ORAL_TABLET | ORAL | Status: DC | PRN

## 2016-06-28 MED ORDER — OXYBUTYNIN CHLORIDE 5 MG PO TABS: 5.0000 mg | ORAL_TABLET | Freq: Three times a day (TID) | ORAL | Status: DC | PRN

## 2016-06-28 MED ORDER — SUCCINYLCHOLINE CHLORIDE 200 MG/10ML IV SOSY
PREFILLED_SYRINGE | INTRAVENOUS | Status: AC
  Filled 2016-06-28: qty 20

## 2016-06-28 MED ORDER — ONDANSETRON 4 MG PO TBDP: 4.0000 mg | ORAL_TABLET | Freq: Three times a day (TID) | ORAL | 0 refills | Status: DC | PRN

## 2016-06-28 MED ORDER — SODIUM CHLORIDE 0.9 % IV SOLN
INTRAVENOUS | Status: DC | PRN
  Administered 2016-06-28: 01:00:00

## 2016-06-28 MED ORDER — SERTRALINE HCL 25 MG PO TABS
25.0000 mg | ORAL_TABLET | Freq: Every day | ORAL | Status: DC
  Administered 2016-06-28: 25 mg via ORAL
  Filled 2016-06-28: qty 1

## 2016-06-28 MED ORDER — HYDROMORPHONE HCL 2 MG/ML IJ SOLN: 0.2500 mg | INTRAMUSCULAR | Status: DC | PRN

## 2016-06-28 MED ORDER — FENTANYL CITRATE (PF) 100 MCG/2ML IJ SOLN
INTRAMUSCULAR | Status: AC
  Filled 2016-06-28: qty 2

## 2016-06-28 MED ORDER — DEXAMETHASONE SODIUM PHOSPHATE 10 MG/ML IJ SOLN
INTRAMUSCULAR | Status: DC | PRN
  Administered 2016-06-28: 10 mg via INTRAVENOUS

## 2016-06-28 MED ORDER — DIPHENHYDRAMINE HCL 50 MG/ML IJ SOLN: 12.5000 mg | Freq: Four times a day (QID) | INTRAMUSCULAR | Status: DC | PRN

## 2016-06-28 SURGICAL SUPPLY — 25 items
BAG URO CATCHER STRL LF (MISCELLANEOUS) ×2 IMPLANT
BASKET DAKOTA 1.9FR 11X120 (BASKET) IMPLANT
BASKET LASER NITINOL 1.9FR (BASKET) IMPLANT
CATH FOLEY LATEX FREE 20FR (CATHETERS)
CATH FOLEY LF 20FR (CATHETERS) IMPLANT
CATH INTERMIT  6FR 70CM (CATHETERS) ×2 IMPLANT
CLOTH BEACON ORANGE TIMEOUT ST (SAFETY) ×2 IMPLANT
COVER SURGICAL LIGHT HANDLE (MISCELLANEOUS) ×2 IMPLANT
EXTRACTOR STONE NITINOL NGAGE (UROLOGICAL SUPPLIES) IMPLANT
FIBER LASER TRAC TIP (UROLOGICAL SUPPLIES) IMPLANT
GLOVE BIO SURGEON STRL SZ8 (GLOVE) ×2 IMPLANT
GOWN STRL REUS W/TWL XL LVL3 (GOWN DISPOSABLE) ×2 IMPLANT
GUIDEWIRE ANG ZIPWIRE 038X150 (WIRE) ×2 IMPLANT
GUIDEWIRE STR DUAL SENSOR (WIRE) ×2 IMPLANT
IV NS 1000ML (IV SOLUTION) ×1
IV NS 1000ML BAXH (IV SOLUTION) ×1 IMPLANT
MANIFOLD NEPTUNE II (INSTRUMENTS) ×2 IMPLANT
PACK CYSTO (CUSTOM PROCEDURE TRAY) ×2 IMPLANT
SHEATH ACCESS URETERAL 38CM (SHEATH) IMPLANT
STENT CONTOUR 6FRX26X.038 (STENTS) IMPLANT
STENT URET 6FRX26 CONTOUR (STENTS) ×2 IMPLANT
SYR CONTROL 10ML LL (SYRINGE) IMPLANT
SYRINGE IRR TOOMEY STRL 70CC (SYRINGE) IMPLANT
TUBE FEEDING 8FR 16IN STR KANG (MISCELLANEOUS) IMPLANT
TUBING CONNECTING 10 (TUBING) ×2 IMPLANT

## 2016-06-28 NOTE — Anesthesia Postprocedure Evaluation (Signed)
Anesthesia Post Note  Patient: Mary [name]  Procedure(s) Performed: Procedure(s) (LRB): CYSTOSCOPY WITH RETROGRADE PYELOGRAM, URETEROSCOPY AND STENT PLACEMENT (Right)  Patient location during evaluation: PACU Anesthesia Type: General Level of consciousness: awake and alert Pain management: pain level controlled Vital Signs Assessment: post-procedure vital signs reviewed and stable Respiratory status: spontaneous breathing, nonlabored ventilation and respiratory function stable Cardiovascular status: blood pressure returned to baseline and stable Postop Assessment: no signs of nausea or vomiting Anesthetic complications: no       Last Vitals:  Vitals:   06/28/16 0215 06/28/16 0230  BP: 118/73 116/68  Pulse: (!) 46 (!) 48  Resp: 10   Temp:  36.9 C    Last Pain:  Vitals:   06/27/16 2356  TempSrc: Oral  PainSc:                  Alexzander Dolinger A.

## 2016-06-28 NOTE — Progress Notes (Signed)
Pt was given d/c instructions and prescription.  Understanding was verbalized and pt was taken down in wheelchair.

## 2016-06-28 NOTE — Transfer of Care (Signed)
Immediate Anesthesia Transfer of Care Note  Patient: Mary [name]  Procedure(s) Performed: Procedure(s): CYSTOSCOPY WITH RETROGRADE PYELOGRAM, URETEROSCOPY AND STENT PLACEMENT (Right)  Patient Location: PACU  Anesthesia Type:General  Level of Consciousness:  sedated, patient cooperative and responds to stimulation  Airway & Oxygen Therapy:Patient Spontanous Breathing and Patient connected to face mask oxgen  Post-op Assessment:  Report given to PACU RN and Post -op Vital signs reviewed and stable  Post vital signs:  Reviewed and stable  Last Vitals:  Vitals:   06/27/16 2356 06/28/16 0141  BP: (!) 137/93   Pulse: 71   Resp: 18   Temp: 36.6 C (P) 36.4 C    Complications: No apparent anesthesia complications

## 2016-06-28 NOTE — Addendum Note (Signed)
Addendum  created 06/28/16 0239 by Mal Amabile, MD   Anesthesia Attestations filed

## 2016-06-28 NOTE — Op Note (Signed)
.  Preoperative diagnosis: right mid ureteral stone, intractable pain  Postoperative diagnosis: Same  Procedure: 1 cystoscopy 2. right retrograde pyelography 3.  Intraoperative fluoroscopy, under one hour, with interpretation 4. right 6 x 26 JJ stent placement  Attending: Wilkie Aye  Anesthesia: General  Estimated blood loss: None  Drains: Right 6 x 26 JJ ureteral stent without tether,   Specimens: none  Antibiotics: ancef  Findings: right mid ureteral stone. Moderate hydronephrosis. No masses/lesions in the bladder. Ureteral orifices in normal anatomic location.  Indications: Patient is a 51 year old female with a history of right ureteral stone and intractable nausea,and pain.  After discussing treatment options, they decided proceed with right stent placement.  Procedure her in detail: The patient was brought to the operating room and a brief timeout was done to ensure correct patient, correct procedure, correct site.  General anesthesia was administered patient was placed in dorsal lithotomy position.  Their genitalia was then prepped and draped in usual sterile fashion.  A rigid 22 French cystoscope was passed in the urethra and the bladder.  Bladder was inspected free masses or lesions.  the ureteral orifices were in the normal orthotopic locations.  a 6 french ureteral catheter was then instilled into the right ureteral orifice.  a gentle retrograde was obtained and findings noted above.  we then placed a sensor wire through the ureteral catheter and advanced up to the renal pelvis.    We then placed a 6 x 26 double-j ureteral stent over the original zip wire.  We then removed the wire and good coil was noted in the the renal pelvis under fluoroscopy and the bladder under direct vision.  A foley catheter was then placed. the bladder was then drained and this concluded the procedure which was well tolerated by patient.  Complications: None  Condition: Stable, extubated,  transferred to PACU  Plan: Patient is to be admitted for observation. She will have her stone extraction in 2 weeks.

## 2016-06-28 NOTE — Anesthesia Procedure Notes (Signed)
Procedure Name: Intubation Date/Time: 06/28/2016 1:15 AM Performed by: Anne Fu Pre-anesthesia Checklist: Patient identified, Emergency Drugs available, Suction available, Patient being monitored and Timeout performed Patient Re-evaluated:Patient Re-evaluated prior to inductionOxygen Delivery Method: Circle system utilized Preoxygenation: Pre-oxygenation with 100% oxygen Intubation Type: IV induction, Rapid sequence and Cricoid Pressure applied Laryngoscope Size: Mac and 4 Grade View: Grade I Tube type: Oral Tube size: 7.5 mm Number of attempts: 1 Airway Equipment and Method: Stylet Placement Confirmation: ETT inserted through vocal cords under direct vision,  positive ETCO2,  CO2 detector and breath sounds checked- equal and bilateral Secured at: 21 cm Tube secured with: Tape Dental Injury: Teeth and Oropharynx as per pre-operative assessment

## 2016-06-28 NOTE — Anesthesia Preprocedure Evaluation (Signed)
Anesthesia Evaluation  Patient identified by MRN, date of birth, ID band Patient awake    Reviewed: Allergy & Precautions, NPO status , Patient's Chart, lab work & pertinent test results  Airway Mallampati: III  TM Distance: >3 FB Neck ROM: Full    Dental no notable dental hx. (+) Teeth Intact, Caps   Pulmonary former smoker,  Reactive airways disease   Pulmonary exam normal breath sounds clear to auscultation       Cardiovascular negative cardio ROS Normal cardiovascular exam Rhythm:Regular Rate:Normal     Neuro/Psych PSYCHIATRIC DISORDERS Depression Bipolar Disorder Schizophrenia Personality disordernegative neurological ROS     GI/Hepatic hiatal hernia, GERD  Medicated and Controlled,(+)     substance abuse  , Hepatitis -, CHx/o Polysubstance abuse   Endo/Other  Obesity   Renal/GU Right ureteral calculus  negative genitourinary   Musculoskeletal negative musculoskeletal ROS (+)   Abdominal (+) + obese,   Peds  Hematology negative hematology ROS (+)   Anesthesia Other Findings   Reproductive/Obstetrics                             Anesthesia Physical Anesthesia Plan  ASA: II and emergent  Anesthesia Plan: General   Post-op Pain Management:    Induction: Intravenous, Rapid sequence and Cricoid pressure planned  Airway Management Planned: Oral ETT  Additional Equipment:   Intra-op Plan:   Post-operative Plan: Extubation in OR  Informed Consent: I have reviewed the patients History and Physical, chart, labs and discussed the procedure including the risks, benefits and alternatives for the proposed anesthesia with the patient or authorized representative who has indicated his/her understanding and acceptance.   Dental advisory given  Plan Discussed with: CRNA, Anesthesiologist and Surgeon  Anesthesia Plan Comments:         Anesthesia Quick Evaluation

## 2016-06-28 NOTE — Discharge Instructions (Signed)
Ureteral Stent Implantation, Care After °Refer to this sheet in the next few weeks. These instructions provide you with information about caring for yourself after your procedure. Your health care provider may also give you more specific instructions. Your treatment has been planned according to current medical practices, but problems sometimes occur. Call your health care provider if you have any problems or questions after your procedure. °What can I expect after the procedure? °After the procedure, it is common to have: °· Nausea. °· Mild pain when you urinate. You may feel this pain in your lower back or lower abdomen. Pain should stop within a few minutes after you urinate. This may last for up to 1 week. °· A small amount of blood in your urine for several days. °Follow these instructions at home: °  °Medicines  °· Take over-the-counter and prescription medicines only as told by your health care provider. °· If you were prescribed an antibiotic medicine, take it as told by your health care provider. Do not stop taking the antibiotic even if you start to feel better. °· Do not drive for 24 hours if you received a sedative. °· Do not drive or operate heavy machinery while taking prescription pain medicines. °Activity  °· Return to your normal activities as told by your health care provider. Ask your health care provider what activities are safe for you. °· Do not lift anything that is heavier than 10 lb (4.5 kg). Follow this limit for 1 week after your procedure, or for as long as told by your health care provider. °General instructions  °· Watch for any blood in your urine. Call your health care provider if the amount of blood in your urine increases. °· If you have a catheter: °¨ Follow instructions from your health care provider about taking care of your catheter and collection bag. °¨ Do not take baths, swim, or use a hot tub until your health care provider approves. °· Drink enough fluid to keep your urine  clear or pale yellow. °· Keep all follow-up visits as told by your health care provider. This is important. °Contact a health care provider if: °· You have pain that gets worse or does not get better with medicine, especially pain when you urinate. °· You have difficulty urinating. °· You feel nauseous or you vomit repeatedly during a period of more than 2 days after the procedure. °Get help right away if: °· Your urine is dark red or has blood clots in it. °· You are leaking urine (have incontinence). °· The end of the stent comes out of your urethra. °· You cannot urinate. °· You have sudden, sharp, or severe pain in your abdomen or lower back. °· You have a fever. °This information is not intended to replace advice given to you by your health care provider. Make sure you discuss any questions you have with your health care provider. °Document Released: 10/30/2012 Document Revised: 08/05/2015 Document Reviewed: 09/11/2014 °[employer]vier Interactive Patient Education © 2017 [employer] ° °

## 2016-06-28 NOTE — H&P (Signed)
Urology Admission H&P  Chief Complaint: Right flank pain  History of Present Illness: Mary [name] is a 50yo with a of Hep C, schizophrenia and bipolar who presented to [facility] ER with a 1 day history of severe right flank pain. The pain started yesterday afternoon and is severe, constant, sharr, nonradiating right flank pain with associated nausea and vomiting. Even with multiple doses of narcotics and antiemetics her pain and nausea/vomiting could not be controlled. This is her first stone event. Afebrile. WBC count 12.8. UA shows blood and WBCs. CT scan shows a 4x15mm right mid ureteral calculus with moderate hydronephrosis  Past Medical History:  Diagnosis Date  . Bipolar depression (HCC)   . Borderline personality disorder   . Depression   . Drug abuse and dependence (HCC)   . H/O hiatal hernia   . Hepatitis C   . Hernia   . Ileus, postoperative (HCC) 02/13/2013  . PTSD (post-traumatic stress disorder)   . Schizophrenia [facility])    Past Surgical History:  Procedure Laterality Date  . DIAGNOSTIC LAPAROSCOPIC LIVER BIOPSY N/A 02/10/2013   Procedure: DIAGNOSTIC LAPAROSCOPIC ;  Surgeon: Ardeth Sportsman, MD;  Location: [facility] ORS;  Service: General;  Laterality: N/A;  DIAGNOSTIC LAPAROSCOPY,laparoscopic ventral hernia repair with mesh lysis of adhesions  . HERNIA REPAIR    . SPLENECTOMY    . TUBAL LIGATION      Home Medications:  Prescriptions Prior to Admission  Medication Sig Dispense Refill Last Dose  . albuterol (PROVENTIL HFA;VENTOLIN HFA) 108 (90 Base) MCG/ACT inhaler Inhale 2 puffs into the lungs every 6 (six) hours as needed for wheezing or shortness of breath. 1 Inhaler 0 PRN at PRN  . naproxen sodium (ALEVE) 220 MG tablet Take 220-440 mg by mouth 2 (two) times daily as needed (for headaches).   PRN at PRN  . pantoprazole (PROTONIX) 40 MG tablet Take 1 tablet (40 mg total) by mouth daily. 30 tablet 3 06/27/2016 at am  . QUEtiapine (SEROQUEL) 300 MG tablet Take 300 mg by mouth at bedtime.    06/26/2016 at pm  . sertraline (ZOLOFT) 25 MG tablet Take 25 mg by mouth daily.   06/27/2016 at am  . sucralfate (CARAFATE) 1 g tablet Take 1 tablet (1 g total) by mouth 4 (four) times daily -  with meals and at bedtime. 40 tablet 0 Past Month at Unknown time  . triamcinolone cream (KENALOG) 0.1 % Apply 1 application topically 2 (two) times daily. For 2 weeks then as needed. (Patient taking differently: Apply 1 application topically 2 (two) times daily as needed (for irritation). ) 45 g 1 PRN at PRN  . baclofen (LIORESAL) 10 MG tablet Take 1 tablet (10 mg total) by mouth 3 (three) times daily. (Patient not taking: Reported on 06/27/2016) 45 each 1 Not Taking at Unknown time   Allergies: No Known Allergies  Family History  Problem Relation Age of Onset  . Alcohol abuse Mother   . Alcohol abuse Father   . Cancer Maternal Grandmother     colon  . Diabetes Maternal Grandmother   . Thyroid disease Neg Hx    Social History:  reports that she quit smoking about 2 months ago. Her smoking use included Cigarettes. She has a 10.00 pack-year smoking history. She has never used smokeless tobacco. She reports that she does not drink alcohol or use drugs.  Review of Systems  Gastrointestinal: Positive for abdominal pain, nausea and vomiting.  Genitourinary: Positive for flank pain.  All other systems reviewed  and are negative.   Physical Exam:  Vital signs in last 24 hours: Temp:  [97.6 F (36.4 C)-98.1 F (36.7 C)] 97.8 F (36.6 C) (04/17 2356) Pulse Rate:  [57-75] 71 (04/17 2356) Resp:  [18-22] 18 (04/17 2356) BP: (102-157)/(51-101) 137/93 (04/17 2356) SpO2:  [90 %-100 %] 95 % (04/17 2356) Physical Exam  Constitutional: She is oriented to person, place, and time. She appears well-developed and well-nourished.  HENT:  Head: Normocephalic and atraumatic.  Eyes: EOM are normal. Pupils are equal, round, and reactive to light.  Neck: No thyromegaly present.  Cardiovascular: Normal rate and  regular rhythm.   Respiratory: Effort normal. No respiratory distress.  GI: Soft. She exhibits no distension.  Musculoskeletal: Normal range of motion. She exhibits no edema.  Neurological: She is alert and oriented to person, place, and time.  Skin: Skin is warm and dry.  Psychiatric: She has a normal mood and affect. Her behavior is normal. Judgment and thought content normal.    Laboratory Data:  Results for orders placed or performed during the hospital encounter of 06/27/16 (from the past 24 hour(s))  Lipase, blood     Status: None   Collection Time: 06/27/16  1:59 PM  Result Value Ref Range   Lipase 17 11 - 51 U/L  Comprehensive metabolic panel     Status: Abnormal   Collection Time: 06/27/16  1:59 PM  Result Value Ref Range   Sodium 137 135 - 145 mmol/L   Potassium 4.2 3.5 - 5.1 mmol/L   Chloride 106 101 - 111 mmol/L   CO2 26 22 - 32 mmol/L   Glucose, Bld 115 (H) 65 - 99 mg/dL   BUN 14 6 - 20 mg/dL   Creatinine, Ser 4.25 0.44 - 1.00 mg/dL   Calcium 95.6 (H) 8.9 - 10.3 mg/dL   Total Protein 7.0 6.5 - 8.1 g/dL   Albumin 4.0 3.5 - 5.0 g/dL   AST 22 15 - 41 U/L   ALT 19 14 - 54 U/L   Alkaline Phosphatase 101 38 - 126 U/L   Total Bilirubin 0.5 0.3 - 1.2 mg/dL   GFR calc non Af Amer >60 >60 mL/min   GFR calc Af Amer >60 >60 mL/min   Anion gap 5 5 - 15  CBC     Status: Abnormal   Collection Time: 06/27/16  1:59 PM  Result Value Ref Range   WBC 12.8 (H) 4.0 - 10.5 K/uL   RBC 4.41 3.87 - 5.11 MIL/uL   Hemoglobin 13.3 12.0 - 15.0 g/dL   HCT 38.7 56.4 - 33.2 %   MCV 89.3 78.0 - 100.0 fL   MCH 30.2 26.0 - 34.0 pg   MCHC 33.8 30.0 - 36.0 g/dL   RDW 95.1 88.4 - 16.6 %   Platelets 332 150 - 400 K/uL  POC Urine Pregnancy, ED (do NOT order at [facility])     Status: None   Collection Time: 06/27/16  5:31 PM  Result Value Ref Range   Preg Test, Ur NEGATIVE NEGATIVE  Urinalysis, Routine w reflex microscopic     Status: Abnormal   Collection Time: 06/27/16  5:44 PM  Result Value Ref  Range   Color, Urine YELLOW YELLOW   APPearance HAZY (A) CLEAR   Specific Gravity, Urine 1.030 1.005 - 1.030   pH 5.0 5.0 - 8.0   Glucose, UA NEGATIVE NEGATIVE mg/dL   Hgb urine dipstick MODERATE (A) NEGATIVE   Bilirubin Urine NEGATIVE NEGATIVE   Ketones, ur NEGATIVE  NEGATIVE mg/dL   Protein, ur NEGATIVE NEGATIVE mg/dL   Nitrite NEGATIVE NEGATIVE   Leukocytes, UA NEGATIVE NEGATIVE   RBC / HPF TOO NUMEROUS TO COUNT 0 - 5 RBC/hpf   WBC, UA 6-30 0 - 5 WBC/hpf   Bacteria, UA RARE (A) NONE SEEN   Squamous Epithelial / LPF 6-30 (A) NONE SEEN   Mucous PRESENT    Ca Oxalate Crys, UA PRESENT    No results found for this or any previous visit (from the past 240 hour(s)). Creatinine:  Recent Labs  06/27/16 1359  CREATININE 0.81   Baseline Creatinine: 0.8  Impression/Assessment:  50yo with right ureteral calculus, intractable pain, nausea and vomiting  Plan:  1. The management options including continued attempts to control her pain versus right ureteral stent placement was explained to the patient. She elects for right ureteral stent placement. She will be taken urgently to the OR for stent placement and then wioll be admitted for observation.   Wilkie Aye 06/28/2016, 1:03 AM

## 2016-06-29 ENCOUNTER — Encounter (HOSPITAL_COMMUNITY): Payer: Self-pay | Admitting: Urology

## 2016-07-04 ENCOUNTER — Other Ambulatory Visit: Payer: Self-pay | Admitting: Urology

## 2016-07-04 ENCOUNTER — Encounter: Payer: Self-pay | Admitting: Nurse Practitioner

## 2016-07-04 ENCOUNTER — Ambulatory Visit (INDEPENDENT_AMBULATORY_CARE_PROVIDER_SITE_OTHER): Payer: BLUE CROSS/BLUE SHIELD | Admitting: Nurse Practitioner

## 2016-07-04 VITALS — BP 116/74 | HR 87 | Temp 97.9°F | Ht 64.0 in | Wt 191.0 lb

## 2016-07-04 DIAGNOSIS — J989 Respiratory disorder, unspecified: Secondary | ICD-10-CM

## 2016-07-04 DIAGNOSIS — K219 Gastro-esophageal reflux disease without esophagitis: Secondary | ICD-10-CM | POA: Diagnosis not present

## 2016-07-04 DIAGNOSIS — J40 Bronchitis, not specified as acute or chronic: Secondary | ICD-10-CM

## 2016-07-04 DIAGNOSIS — R0989 Other specified symptoms and signs involving the circulatory and respiratory systems: Secondary | ICD-10-CM

## 2016-07-04 DIAGNOSIS — J410 Simple chronic bronchitis: Secondary | ICD-10-CM | POA: Diagnosis not present

## 2016-07-04 DIAGNOSIS — Z720 Tobacco use: Secondary | ICD-10-CM

## 2016-07-04 DIAGNOSIS — L298 Other pruritus: Secondary | ICD-10-CM | POA: Diagnosis not present

## 2016-07-04 MED ORDER — ALBUTEROL SULFATE HFA 108 (90 BASE) MCG/ACT IN AERS: 2.0000 | INHALATION_SPRAY | Freq: Four times a day (QID) | RESPIRATORY_TRACT | 1 refills | Status: DC | PRN

## 2016-07-04 MED ORDER — FLUTICASONE-SALMETEROL 230-21 MCG/ACT IN AERO: 2.0000 | INHALATION_SPRAY | Freq: Two times a day (BID) | RESPIRATORY_TRACT | 2 refills | Status: DC

## 2016-07-04 MED ORDER — CLOTRIMAZOLE-BETAMETHASONE 1-0.05 % EX CREA: 1.0000 "application " | TOPICAL_CREAM | Freq: Two times a day (BID) | CUTANEOUS | 1 refills | Status: DC

## 2016-07-04 MED ORDER — RANITIDINE HCL 300 MG PO TABS: 300.0000 mg | ORAL_TABLET | Freq: Every day | ORAL | 1 refills | Status: DC

## 2016-07-04 NOTE — Progress Notes (Signed)
Subjective:  Patient ID: Mary [name], female    DOB: 10-13-1965  Age: 51 y.o. MRN: [id number]  CC: Establish Care (est care/rash on right leg and coughing)   Rash  This is a recurrent problem. The current episode started more than 1 year ago. The problem has been waxing and waning since onset. The affected locations include the left lower leg. The rash is characterized by itchiness, dryness, swelling and redness. It is unknown if there was an exposure to a precipitant. Associated symptoms include congestion, coughing and shortness of breath. Pertinent negatives include no anorexia, diarrhea, eye pain, facial edema, fatigue, fever, joint pain, nail changes, rhinorrhea, sore throat or vomiting. Past treatments include anti-itch cream, antihistamine and antibiotic cream. The treatment provided no relief. Her past medical history is significant for asthma.  Cough  This is a chronic problem. The current episode started more than 1 year ago. The problem has been unchanged. The problem occurs constantly. The cough is productive of sputum (clear sputum). Associated symptoms include a rash, shortness of breath and wheezing. Pertinent negatives include no chest pain, chills, ear congestion, ear pain, fever, headaches, heartburn, hemoptysis, myalgias, nasal congestion, postnasal drip, rhinorrhea, sore throat, sweats or weight loss. The symptoms are aggravated by exercise and cold air (tobacco use). Risk factors for lung disease include smoking/tobacco exposure. She has tried a beta-agonist inhaler for the symptoms. The treatment provided mild relief. Her past medical history is significant for asthma, bronchitis, COPD and environmental allergies.   No previous pcp.  Polysubstance abuse: 4years sober Current member of [employer]. Current patient on [employer].  Outpatient Medications Prior to Visit  Medication Sig Dispense Refill  . baclofen (LIORESAL) 10 MG tablet Take 1 tablet (10 mg total) by mouth 3 (three)  times daily. 45 each 1  . ondansetron (ZOFRAN ODT) 4 MG disintegrating tablet Take 1 tablet (4 mg total) by mouth every 8 (eight) hours as needed for nausea or vomiting. 30 tablet 0  . oxyCODONE-acetaminophen (ROXICET) 5-325 MG tablet Take 1 tablet by mouth every 4 (four) hours as needed for severe pain. 30 tablet 0  . pantoprazole (PROTONIX) 40 MG tablet Take 1 tablet (40 mg total) by mouth daily. 30 tablet 3  . QUEtiapine (SEROQUEL) 300 MG tablet Take 300 mg by mouth at bedtime.    . sertraline (ZOLOFT) 25 MG tablet Take 25 mg by mouth daily.    . sucralfate (CARAFATE) 1 g tablet Take 1 tablet (1 g total) by mouth 4 (four) times daily -  with meals and at bedtime. 40 tablet 0  . albuterol (PROVENTIL HFA;VENTOLIN HFA) 108 (90 Base) MCG/ACT inhaler Inhale 2 puffs into the lungs every 6 (six) hours as needed for wheezing or shortness of breath. 1 Inhaler 0  . triamcinolone cream (KENALOG) 0.1 % Apply 1 application topically 2 (two) times daily. For 2 weeks then as needed. (Patient taking differently: Apply 1 application topically 2 (two) times daily as needed (for irritation). ) 45 g 1  . naproxen sodium (ALEVE) 220 MG tablet Take 220-440 mg by mouth 2 (two) times daily as needed (for headaches).     No facility-administered medications prior to visit.     ROS See HPI  Objective:  BP 116/74   Pulse 87   Temp 97.9 F (36.6 C)   Ht  (1.626 m)   Wt 191 lb (86.6 kg)   SpO2 100%   BMI 32.79 kg/m   BP Readings from Last 3 Encounters:  07/04/16 116/74  06/28/16 139/62  05/25/16 128/74    Wt Readings from Last 3 Encounters:  07/04/16 191 lb (86.6 kg)  05/25/16 189 lb (85.7 kg)  05/02/16 190 lb 6.4 oz (86.4 kg)    Physical Exam  Constitutional: She is oriented to person, place, and time.  Neck: Normal range of motion. Neck supple.  Cardiovascular: Normal rate, regular rhythm and normal heart sounds.   Pulmonary/Chest: Effort normal. She has wheezes. She has no rales.    Musculoskeletal: She exhibits no edema or tenderness.  Neurological: She is alert and oriented to person, place, and time.  Skin: Skin is dry. Lesion and rash noted. Rash is maculopapular.     Hyperpigmented rash  Vitals reviewed.   Lab Results  Component Value Date   WBC 12.8 (H) 06/27/2016   HGB 13.3 06/27/2016   HCT 39.4 06/27/2016   PLT 332 06/27/2016   GLUCOSE 115 (H) 06/27/2016   ALT 19 06/27/2016   AST 22 06/27/2016   NA 137 06/27/2016   K 4.2 06/27/2016   CL 106 06/27/2016   CREATININE 0.81 06/27/2016   BUN 14 06/27/2016   CO2 26 06/27/2016   INR 1.1 04/26/2016    Dg C-arm 1-60 Min-no Report  Result Date: 06/28/2016 Fluoroscopy was utilized by the requesting physician.  No radiographic interpretation.   Ct Renal Stone Study  Result Date: 06/27/2016 CLINICAL DATA:  Acute onset of right lower quadrant abdominal pain. Initial encounter. EXAM: CT ABDOMEN AND PELVIS WITHOUT CONTRAST TECHNIQUE: Multidetector CT imaging of the abdomen and pelvis was performed following the standard protocol without IV contrast. COMPARISON:  CT of the abdomen and pelvis from 02/10/2013 FINDINGS: Lower chest: Minimal bibasilar atelectasis is noted. The visualized portions of the mediastinum are unremarkable. Hepatobiliary: The liver is unremarkable in appearance. The gallbladder is unremarkable in appearance. The common bile duct remains normal in caliber. Pancreas: The pancreas is within normal limits. Spleen: The patient is status post splenectomy. Adrenals/Urinary Tract: The adrenal glands are unremarkable in appearance. There is mild right-sided hydronephrosis, with prominence of the right ureter along its entire course. An obstructing 7 x 4 mm stone is noted at the distal right ureter, 3-4 cm above the right vesicoureteral junction. Scattered right-sided perinephric stranding is noted. No nonobstructing renal stones are identified. The left kidney is unremarkable in appearance. Stomach/Bowel:  The stomach is unremarkable in appearance. The small bowel is within normal limits. The appendix is normal in caliber, without evidence of appendicitis. The colon is unremarkable in appearance. Vascular/Lymphatic: Minimal calcification is seen at the distal abdominal aorta. No retroperitoneal or pelvic sidewall lymphadenopathy is seen. Reproductive: The bladder is relatively decompressed and grossly unremarkable in appearance. The uterus is unremarkable in appearance. The ovaries are relatively symmetric. No suspicious adnexal masses are seen. Other: No additional soft tissue abnormalities are seen. Musculoskeletal: No acute osseous abnormalities are identified. There is grade 2 anterolisthesis of L5 on S1, reflecting underlying facet disease. Associated vacuum phenomenon is noted. The visualized musculature is unremarkable in appearance. IMPRESSION: 1. Mild right-sided hydronephrosis, with an obstructing 7 x 4 mm stone noted at the distal right ureter, 3-4 cm above the right vesicoureteral junction. 2. Grade 2 anterolisthesis of L5 on S1, reflecting underlying facet disease. Electronically Signed   By: Roanna Raider M.D.   On: 06/27/2016 19:18    Assessment & Plan:   Suzzane was seen today for establish care.  Diagnoses and all orders for this visit:  Pruritic erythematous rash -     clotrimazole-betamethasone (  LOTRISONE) cream; Apply 1 application topically 2 (two) times daily.  Bronchitis due to tobacco use (HCC) -     fluticasone-salmeterol (ADVAIR HFA) 230-21 MCG/ACT inhaler; Inhale 2 puffs into the lungs 2 (two) times daily. Rinse mouth after each use  GERD without esophagitis -     ranitidine (ZANTAC) 300 MG tablet; Take 1 tablet (300 mg total) by mouth at bedtime.  Reactive airway disease that is not asthma -     albuterol (PROVENTIL HFA;VENTOLIN HFA) 108 (90 Base) MCG/ACT inhaler; Inhale 2 puffs into the lungs every 6 (six) hours as needed for wheezing or shortness of breath.   I have  discontinued Ms. Toor triamcinolone cream. I am also having her start on clotrimazole-betamethasone, fluticasone-salmeterol, and ranitidine. Additionally, I am having her maintain her QUEtiapine, sertraline, pantoprazole, sucralfate, baclofen, naproxen sodium, oxyCODONE-acetaminophen, ondansetron, and albuterol.  Meds ordered this encounter  Medications  . clotrimazole-betamethasone (LOTRISONE) cream    Sig: Apply 1 application topically 2 (two) times daily.    Dispense:  45 g    Refill:  1    Order Specific Question:   Supervising Provider    Answer:   Tresa Garter [[id number]]  . fluticasone-salmeterol (ADVAIR HFA) 230-21 MCG/ACT inhaler    Sig: Inhale 2 puffs into the lungs 2 (two) times daily. Rinse mouth after each use    Dispense:  1 Inhaler    Refill:  2    Order Specific Question:   Supervising Provider    Answer:   Tresa Garter [[id number]]  . ranitidine (ZANTAC) 300 MG tablet    Sig: Take 1 tablet (300 mg total) by mouth at bedtime.    Dispense:  30 tablet    Refill:  1    Order Specific Question:   Supervising Provider    Answer:   Tresa Garter [[id number]]  . albuterol (PROVENTIL HFA;VENTOLIN HFA) 108 (90 Base) MCG/ACT inhaler    Sig: Inhale 2 puffs into the lungs every 6 (six) hours as needed for wheezing or shortness of breath.    Dispense:  1 Inhaler    Refill:  1    Order Specific Question:   Supervising Provider    Answer:   Tresa Garter [[id number]]    Follow-up: Return in about 2 weeks (around 07/18/2016) for rash and bronchitis (fasting, appt).  Alysia Penna, NP

## 2016-07-04 NOTE — Progress Notes (Signed)
Pre visit review using our clinic review tool, if applicable. No additional management support is needed unless otherwise documented below in the visit note. 

## 2016-07-06 ENCOUNTER — Encounter (HOSPITAL_BASED_OUTPATIENT_CLINIC_OR_DEPARTMENT_OTHER): Payer: Self-pay | Admitting: *Deleted

## 2016-07-10 ENCOUNTER — Encounter (HOSPITAL_BASED_OUTPATIENT_CLINIC_OR_DEPARTMENT_OTHER): Payer: Self-pay | Admitting: *Deleted

## 2016-07-10 NOTE — Progress Notes (Signed)
NPO AFTER MN W/ EXCEPTION CLEAR LIQUIDS UNTIL 0800 (NO CREAM /MILK PRODUCTS).  ARRIVE AT 1230.  CURRENT LAB RESULTS IN CHART AND EPIC.  WILL TAKE PROTONIX , BACLOFEN, AND DO ADVAIR INHALER AM DOS W/ SIPS   OF WATER.  PT VERBALIZED UNDERSTANDING OK TO USE CAB SINCE SHE HAS ADULT FRIEND COMING WITH HER.

## 2016-07-14 ENCOUNTER — Ambulatory Visit (HOSPITAL_BASED_OUTPATIENT_CLINIC_OR_DEPARTMENT_OTHER): Payer: BLUE CROSS/BLUE SHIELD | Admitting: Anesthesiology

## 2016-07-14 ENCOUNTER — Encounter (HOSPITAL_BASED_OUTPATIENT_CLINIC_OR_DEPARTMENT_OTHER)
Admission: RE | Disposition: A | Discharge status: Home / Self Care | Payer: Self-pay | Source: Ambulatory Visit | Attending: Urology

## 2016-07-14 ENCOUNTER — Encounter (HOSPITAL_BASED_OUTPATIENT_CLINIC_OR_DEPARTMENT_OTHER): Payer: Self-pay

## 2016-07-14 ENCOUNTER — Ambulatory Visit (HOSPITAL_BASED_OUTPATIENT_CLINIC_OR_DEPARTMENT_OTHER)
Admission: RE | Admit: 2016-07-14 | Discharge: 2016-07-14 | Disposition: A | Discharge status: Home / Self Care | Payer: BLUE CROSS/BLUE SHIELD | Source: Ambulatory Visit | Attending: Urology | Admitting: Urology

## 2016-07-14 DIAGNOSIS — F603 Borderline personality disorder: Secondary | ICD-10-CM | POA: Diagnosis not present

## 2016-07-14 DIAGNOSIS — F4310 Post-traumatic stress disorder, unspecified: Secondary | ICD-10-CM | POA: Diagnosis not present

## 2016-07-14 DIAGNOSIS — N132 Hydronephrosis with renal and ureteral calculous obstruction: Secondary | ICD-10-CM | POA: Insufficient documentation

## 2016-07-14 DIAGNOSIS — Z7951 Long term (current) use of inhaled steroids: Secondary | ICD-10-CM | POA: Insufficient documentation

## 2016-07-14 DIAGNOSIS — Z79899 Other long term (current) drug therapy: Secondary | ICD-10-CM | POA: Insufficient documentation

## 2016-07-14 DIAGNOSIS — Z9851 Tubal ligation status: Secondary | ICD-10-CM | POA: Diagnosis not present

## 2016-07-14 DIAGNOSIS — N201 Calculus of ureter: Secondary | ICD-10-CM | POA: Diagnosis not present

## 2016-07-14 DIAGNOSIS — F319 Bipolar disorder, unspecified: Secondary | ICD-10-CM | POA: Insufficient documentation

## 2016-07-14 DIAGNOSIS — Z466 Encounter for fitting and adjustment of urinary device: Secondary | ICD-10-CM | POA: Diagnosis not present

## 2016-07-14 DIAGNOSIS — Z87891 Personal history of nicotine dependence: Secondary | ICD-10-CM | POA: Insufficient documentation

## 2016-07-14 DIAGNOSIS — F209 Schizophrenia, unspecified: Secondary | ICD-10-CM | POA: Insufficient documentation

## 2016-07-14 DIAGNOSIS — B1920 Unspecified viral hepatitis C without hepatic coma: Secondary | ICD-10-CM | POA: Diagnosis not present

## 2016-07-14 DIAGNOSIS — K219 Gastro-esophageal reflux disease without esophagitis: Secondary | ICD-10-CM | POA: Diagnosis not present

## 2016-07-14 HISTORY — DX: Gastro-esophageal reflux disease without esophagitis: K21.9

## 2016-07-14 HISTORY — DX: Primary hyperparathyroidism: E21.0

## 2016-07-14 HISTORY — DX: Chronic obstructive pulmonary disease, unspecified: J44.9

## 2016-07-14 HISTORY — DX: Other pruritus: L29.89

## 2016-07-14 HISTORY — DX: Personal history of suicidal behavior: Z91.51

## 2016-07-14 HISTORY — DX: Personal history of self-harm: Z91.5

## 2016-07-14 HISTORY — DX: Personal history of other infectious and parasitic diseases: Z86.19

## 2016-07-14 HISTORY — DX: Calculus of ureter: N20.1

## 2016-07-14 HISTORY — DX: Other pruritus: L29.8

## 2016-07-14 HISTORY — DX: Other psychoactive substance abuse, in remission: F19.11

## 2016-07-14 HISTORY — DX: Hypercalcemia: E83.52

## 2016-07-14 HISTORY — PX: HOLMIUM LASER APPLICATION: SHX5852

## 2016-07-14 HISTORY — DX: Diaphragmatic hernia without obstruction or gangrene: K44.9

## 2016-07-14 HISTORY — PX: CYSTOSCOPY W/ URETERAL STENT PLACEMENT: SHX1429

## 2016-07-14 HISTORY — PX: CYSTOSCOPY/RETROGRADE/URETEROSCOPY/STONE EXTRACTION WITH BASKET: SHX5317

## 2016-07-14 HISTORY — DX: Alcohol abuse, in remission: F10.11

## 2016-07-14 HISTORY — DX: Vitamin D deficiency, unspecified: E55.9

## 2016-07-14 SURGERY — CYSTOSCOPY, WITH CALCULUS REMOVAL USING BASKET
Anesthesia: General | Site: Ureter | Laterality: Right

## 2016-07-14 MED ORDER — FENTANYL CITRATE (PF) 100 MCG/2ML IJ SOLN
25.0000 ug | INTRAMUSCULAR | Status: DC | PRN
  Filled 2016-07-14: qty 1

## 2016-07-14 MED ORDER — ONDANSETRON HCL 4 MG/2ML IJ SOLN
INTRAMUSCULAR | Status: AC
  Filled 2016-07-14: qty 2

## 2016-07-14 MED ORDER — KETOROLAC TROMETHAMINE 30 MG/ML IJ SOLN
INTRAMUSCULAR | Status: AC
  Filled 2016-07-14: qty 1

## 2016-07-14 MED ORDER — METOCLOPRAMIDE HCL 5 MG/ML IJ SOLN
INTRAMUSCULAR | Status: DC | PRN
  Administered 2016-07-14: 10 mg via INTRAVENOUS

## 2016-07-14 MED ORDER — ALBUTEROL SULFATE (2.5 MG/3ML) 0.083% IN NEBU
2.5000 mg | INHALATION_SOLUTION | RESPIRATORY_TRACT | Status: AC
  Administered 2016-07-14: 2.5 mg via RESPIRATORY_TRACT
  Filled 2016-07-14: qty 3

## 2016-07-14 MED ORDER — LIDOCAINE 2% (20 MG/ML) 5 ML SYRINGE
INTRAMUSCULAR | Status: DC | PRN
  Administered 2016-07-14: 100 mg via INTRAVENOUS

## 2016-07-14 MED ORDER — SCOPOLAMINE 1 MG/3DAYS TD PT72
MEDICATED_PATCH | TRANSDERMAL | Status: DC | PRN
  Administered 2016-07-14: 1 via TRANSDERMAL

## 2016-07-14 MED ORDER — IOHEXOL 300 MG/ML  SOLN
INTRAMUSCULAR | Status: DC | PRN
  Administered 2016-07-14: 10 mL

## 2016-07-14 MED ORDER — FENTANYL CITRATE (PF) 100 MCG/2ML IJ SOLN
INTRAMUSCULAR | Status: DC | PRN
  Administered 2016-07-14 (×2): 50 ug via INTRAVENOUS

## 2016-07-14 MED ORDER — SODIUM CHLORIDE 0.9 % IV SOLN
INTRAVENOUS | Status: DC
  Administered 2016-07-14: 13:00:00 via INTRAVENOUS
  Filled 2016-07-14: qty 1000

## 2016-07-14 MED ORDER — ALBUTEROL SULFATE (2.5 MG/3ML) 0.083% IN NEBU
INHALATION_SOLUTION | RESPIRATORY_TRACT | Status: AC
  Filled 2016-07-14: qty 3

## 2016-07-14 MED ORDER — DEXTROSE 5 % IV SOLN
INTRAVENOUS | Status: AC
  Filled 2016-07-14: qty 50

## 2016-07-14 MED ORDER — METOCLOPRAMIDE HCL 5 MG/ML IJ SOLN
INTRAMUSCULAR | Status: AC
  Filled 2016-07-14: qty 2

## 2016-07-14 MED ORDER — ONDANSETRON HCL 4 MG/2ML IJ SOLN
INTRAMUSCULAR | Status: DC | PRN
  Administered 2016-07-14: 4 mg via INTRAVENOUS

## 2016-07-14 MED ORDER — MIDAZOLAM HCL 5 MG/5ML IJ SOLN
INTRAMUSCULAR | Status: DC | PRN
  Administered 2016-07-14 (×2): 1 mg via INTRAVENOUS

## 2016-07-14 MED ORDER — PROPOFOL 10 MG/ML IV BOLUS
INTRAVENOUS | Status: AC
  Filled 2016-07-14: qty 20

## 2016-07-14 MED ORDER — SUCCINYLCHOLINE CHLORIDE 200 MG/10ML IV SOSY
PREFILLED_SYRINGE | INTRAVENOUS | Status: DC | PRN
  Administered 2016-07-14: 100 mg via INTRAVENOUS

## 2016-07-14 MED ORDER — DEXAMETHASONE SODIUM PHOSPHATE 10 MG/ML IJ SOLN
INTRAMUSCULAR | Status: AC
  Filled 2016-07-14: qty 1

## 2016-07-14 MED ORDER — STERILE WATER FOR IRRIGATION IR SOLN
Status: DC | PRN
  Administered 2016-07-14: 500 mL

## 2016-07-14 MED ORDER — MIDAZOLAM HCL 2 MG/2ML IJ SOLN
INTRAMUSCULAR | Status: AC
  Filled 2016-07-14: qty 2

## 2016-07-14 MED ORDER — FENTANYL CITRATE (PF) 100 MCG/2ML IJ SOLN
INTRAMUSCULAR | Status: AC
  Filled 2016-07-14: qty 2

## 2016-07-14 MED ORDER — DEXAMETHASONE SODIUM PHOSPHATE 4 MG/ML IJ SOLN
INTRAMUSCULAR | Status: DC | PRN
  Administered 2016-07-14: 10 mg via INTRAVENOUS

## 2016-07-14 MED ORDER — SUCCINYLCHOLINE CHLORIDE 200 MG/10ML IV SOSY
PREFILLED_SYRINGE | INTRAVENOUS | Status: AC
  Filled 2016-07-14: qty 10

## 2016-07-14 MED ORDER — SODIUM CHLORIDE 0.9 % IR SOLN
Status: DC | PRN
  Administered 2016-07-14: 4000 mL

## 2016-07-14 MED ORDER — PROPOFOL 10 MG/ML IV BOLUS
INTRAVENOUS | Status: DC | PRN
  Administered 2016-07-14: 200 mg via INTRAVENOUS

## 2016-07-14 MED ORDER — CEFTRIAXONE SODIUM 2 G IJ SOLR
INTRAMUSCULAR | Status: AC
  Filled 2016-07-14: qty 2

## 2016-07-14 MED ORDER — LACTATED RINGERS IV SOLN
INTRAVENOUS | Status: DC | PRN
  Administered 2016-07-14: 17:00:00 via INTRAVENOUS

## 2016-07-14 MED ORDER — DEXTROSE 5 % IV SOLN
2.0000 g | INTRAVENOUS | Status: AC
  Administered 2016-07-14: 2 g via INTRAVENOUS
  Filled 2016-07-14: qty 2

## 2016-07-14 MED ORDER — SCOPOLAMINE 1 MG/3DAYS TD PT72
MEDICATED_PATCH | TRANSDERMAL | Status: AC
  Filled 2016-07-14: qty 1

## 2016-07-14 MED ORDER — KETOROLAC TROMETHAMINE 30 MG/ML IJ SOLN
INTRAMUSCULAR | Status: DC | PRN
Start: 2016-07-14 — End: 2016-07-14
  Administered 2016-07-14: 30 mg via INTRAVENOUS

## 2016-07-14 MED ORDER — OXYCODONE-ACETAMINOPHEN 5-325 MG PO TABS: 1.0000 | ORAL_TABLET | ORAL | 0 refills | Status: DC | PRN

## 2016-07-14 SURGICAL SUPPLY — 28 items
BAG DRAIN URO-CYSTO SKYTR STRL (DRAIN) ×2 IMPLANT
BASKET DAKOTA 1.9FR 11X120 (BASKET) IMPLANT
BASKET LASER NITINOL 1.9FR (BASKET) IMPLANT
BASKET ZERO TIP NITINOL 2.4FR (BASKET) IMPLANT
CATH INTERMIT  6FR 70CM (CATHETERS) IMPLANT
CLOTH BEACON ORANGE TIMEOUT ST (SAFETY) ×2 IMPLANT
EVACUATOR MICROVAS BLADDER (UROLOGICAL SUPPLIES) IMPLANT
FIBER LASER FLEXIVA 1000 (UROLOGICAL SUPPLIES) IMPLANT
FIBER LASER FLEXIVA 200 (UROLOGICAL SUPPLIES) IMPLANT
FIBER LASER FLEXIVA 550 (UROLOGICAL SUPPLIES) IMPLANT
FIBER LASER TRAC TIP (UROLOGICAL SUPPLIES) ×2 IMPLANT
GLOVE BIO SURGEON STRL SZ8 (GLOVE) ×2 IMPLANT
GLOVE INDICATOR 7.5 STRL GRN (GLOVE) ×4 IMPLANT
GOWN STRL REUS W/ TWL LRG LVL3 (GOWN DISPOSABLE) ×1 IMPLANT
GOWN STRL REUS W/ TWL XL LVL3 (GOWN DISPOSABLE) ×1 IMPLANT
GOWN STRL REUS W/TWL LRG LVL3 (GOWN DISPOSABLE) ×1
GOWN STRL REUS W/TWL XL LVL3 (GOWN DISPOSABLE) ×1
GUIDEWIRE ANG ZIPWIRE 038X150 (WIRE) ×2 IMPLANT
GUIDEWIRE STR DUAL SENSOR (WIRE) IMPLANT
IV NS IRRIG 3000ML ARTHROMATIC (IV SOLUTION) ×2 IMPLANT
KIT RM TURNOVER CYSTO AR (KITS) ×2 IMPLANT
MANIFOLD NEPTUNE II (INSTRUMENTS) ×2 IMPLANT
PACK CYSTO (CUSTOM PROCEDURE TRAY) ×2 IMPLANT
STENT URET 6FRX26 CONTOUR (STENTS) ×2 IMPLANT
SYRINGE 10CC LL (SYRINGE) ×2 IMPLANT
TUBE CONNECTING 12X1/4 (SUCTIONS) ×2 IMPLANT
TUBE FEEDING 8FR 16IN STR KANG (MISCELLANEOUS) IMPLANT
WATER STERILE IRR 500ML POUR (IV SOLUTION) ×2 IMPLANT

## 2016-07-14 NOTE — H&P (View-Only) (Signed)
Urology Admission H&P  Chief Complaint: Right flank pain  History of Present Illness: Ms Welcome is a 51yo with a of Hep C, schizophrenia and bipolar who presented to [facility] with a 1 day history of severe right flank pain. The pain started yesterday afternoon and is severe, constant, sharr, nonradiating right flank pain with associated nausea and vomiting. Even with multiple doses of narcotics and antiemetics her pain and nausea/vomiting could not be controlled. This is her first stone event. Afebrile. WBC count 12.8. UA shows blood and WBCs. CT scan shows a 4x7mm right mid ureteral calculus with moderate hydronephrosis  Past Medical History:  Diagnosis Date  . Bipolar depression (HCC)   . Borderline personality disorder   . Depression   . Drug abuse and dependence (HCC)   . H/O hiatal hernia   . Hepatitis C   . Hernia   . Ileus, postoperative (HCC) 02/13/2013  . PTSD (post-traumatic stress disorder)   . Schizophrenia (HCC)    Past Surgical History:  Procedure Laterality Date  . DIAGNOSTIC LAPAROSCOPIC LIVER BIOPSY N/A 02/10/2013   Procedure: DIAGNOSTIC LAPAROSCOPIC ;  Surgeon: Steven C. Gross, MD;  Location: [facility] ORS;  Service: General;  Laterality: N/A;  DIAGNOSTIC LAPAROSCOPY,laparoscopic ventral hernia repair with mesh lysis of adhesions  . HERNIA REPAIR    . SPLENECTOMY    . TUBAL LIGATION      Home Medications:  Prescriptions Prior to Admission  Medication Sig Dispense Refill Last Dose  . albuterol (PROVENTIL HFA;VENTOLIN HFA) 108 (90 Base) MCG/ACT inhaler Inhale 2 puffs into the lungs every 6 (six) hours as needed for wheezing or shortness of breath. 1 Inhaler 0 PRN at PRN  . naproxen sodium (ALEVE) 220 MG tablet Take 220-440 mg by mouth 2 (two) times daily as needed (for headaches).   PRN at PRN  . pantoprazole (PROTONIX) 40 MG tablet Take 1 tablet (40 mg total) by mouth daily. 30 tablet 3 06/27/2016 at am  . QUEtiapine (SEROQUEL) 300 MG tablet Take 300 mg by mouth at bedtime.    06/26/2016 at pm  . sertraline (ZOLOFT) 25 MG tablet Take 25 mg by mouth daily.   06/27/2016 at am  . sucralfate (CARAFATE) 1 g tablet Take 1 tablet (1 g total) by mouth 4 (four) times daily -  with meals and at bedtime. 40 tablet 0 Past Month at Unknown time  . triamcinolone cream (KENALOG) 0.1 % Apply 1 application topically 2 (two) times daily. For 2 weeks then as needed. (Patient taking differently: Apply 1 application topically 2 (two) times daily as needed (for irritation). ) 45 g 1 PRN at PRN  . baclofen (LIORESAL) 10 MG tablet Take 1 tablet (10 mg total) by mouth 3 (three) times daily. (Patient not taking: Reported on 06/27/2016) 45 each 1 Not Taking at Unknown time   Allergies: No Known Allergies  Family History  Problem Relation Age of Onset  . Alcohol abuse Mother   . Alcohol abuse Father   . Cancer Maternal Grandmother     colon  . Diabetes Maternal Grandmother   . Thyroid disease Neg Hx    Social History:  reports that she quit smoking about 2 months ago. Her smoking use included Cigarettes. She has a 10.00 pack-year smoking history. She has never used smokeless tobacco. She reports that she does not drink alcohol or use drugs.  Review of Systems  Gastrointestinal: Positive for abdominal pain, nausea and vomiting.  Genitourinary: Positive for flank pain.  All other systems reviewed   and are negative.   Physical Exam:  Vital signs in last 24 hours: Temp:  [97.6 F (36.4 C)-98.1 F (36.7 C)] 97.8 F (36.6 C) (04/17 2356) Pulse Rate:  [57-75] 71 (04/17 2356) Resp:  [18-22] 18 (04/17 2356) BP: (102-157)/(51-101) 137/93 (04/17 2356) SpO2:  [90 %-100 %] 95 % (04/17 2356) Physical Exam  Constitutional: She is oriented to person, place, and time. She appears well-developed and well-nourished.  HENT:  Head: Normocephalic and atraumatic.  Eyes: EOM are normal. Pupils are equal, round, and reactive to light.  Neck: No thyromegaly present.  Cardiovascular: Normal rate and  regular rhythm.   Respiratory: Effort normal. No respiratory distress.  GI: Soft. She exhibits no distension.  Musculoskeletal: Normal range of motion. She exhibits no edema.  Neurological: She is alert and oriented to person, place, and time.  Skin: Skin is warm and dry.  Psychiatric: She has a normal mood and affect. Her behavior is normal. Judgment and thought content normal.    Laboratory Data:  Results for orders placed or performed during the hospital encounter of 06/27/16 (from the past 24 hour(s))  Lipase, blood     Status: None   Collection Time: 06/27/16  1:59 PM  Result Value Ref Range   Lipase 17 11 - 51 U/L  Comprehensive metabolic panel     Status: Abnormal   Collection Time: 06/27/16  1:59 PM  Result Value Ref Range   Sodium 137 135 - 145 mmol/L   Potassium 4.2 3.5 - 5.1 mmol/L   Chloride 106 101 - 111 mmol/L   CO2 26 22 - 32 mmol/L   Glucose, Bld 115 (H) 65 - 99 mg/dL   BUN 14 6 - 20 mg/dL   Creatinine, Ser 0.81 0.44 - 1.00 mg/dL   Calcium 10.6 (H) 8.9 - 10.3 mg/dL   Total Protein 7.0 6.5 - 8.1 g/dL   Albumin 4.0 3.5 - 5.0 g/dL   AST 22 15 - 41 U/L   ALT 19 14 - 54 U/L   Alkaline Phosphatase 101 38 - 126 U/L   Total Bilirubin 0.5 0.3 - 1.2 mg/dL   GFR calc non Af Amer >60 >60 mL/min   GFR calc Af Amer >60 >60 mL/min   Anion gap 5 5 - 15  CBC     Status: Abnormal   Collection Time: 06/27/16  1:59 PM  Result Value Ref Range   WBC 12.8 (H) 4.0 - 10.5 K/uL   RBC 4.41 3.87 - 5.11 MIL/uL   Hemoglobin 13.3 12.0 - 15.0 g/dL   HCT 39.4 36.0 - 46.0 %   MCV 89.3 78.0 - 100.0 fL   MCH 30.2 26.0 - 34.0 pg   MCHC 33.8 30.0 - 36.0 g/dL   RDW 13.4 11.5 - 15.5 %   Platelets 332 150 - 400 K/uL  POC Urine Pregnancy, ED (do NOT order at [facility])     Status: None   Collection Time: 06/27/16  5:31 PM  Result Value Ref Range   Preg Test, Ur NEGATIVE NEGATIVE  Urinalysis, Routine w reflex microscopic     Status: Abnormal   Collection Time: 06/27/16  5:44 PM  Result Value Ref  Range   Color, Urine YELLOW YELLOW   APPearance HAZY (A) CLEAR   Specific Gravity, Urine 1.030 1.005 - 1.030   pH 5.0 5.0 - 8.0   Glucose, UA NEGATIVE NEGATIVE mg/dL   Hgb urine dipstick MODERATE (A) NEGATIVE   Bilirubin Urine NEGATIVE NEGATIVE   Ketones, ur NEGATIVE   NEGATIVE mg/dL   Protein, ur NEGATIVE NEGATIVE mg/dL   Nitrite NEGATIVE NEGATIVE   Leukocytes, UA NEGATIVE NEGATIVE   RBC / HPF TOO NUMEROUS TO COUNT 0 - 5 RBC/hpf   WBC, UA 6-30 0 - 5 WBC/hpf   Bacteria, UA RARE (A) NONE SEEN   Squamous Epithelial / LPF 6-30 (A) NONE SEEN   Mucous PRESENT    Ca Oxalate Crys, UA PRESENT    No results found for this or any previous visit (from the past 240 hour(s)). Creatinine:  Recent Labs  06/27/16 1359  CREATININE 0.81   Baseline Creatinine: 0.8  Impression/Assessment:  50yo with right ureteral calculus, intractable pain, nausea and vomiting  Plan:  1. The management options including continued attempts to control her pain versus right ureteral stent placement was explained to the patient. She elects for right ureteral stent placement. She will be taken urgently to the OR for stent placement and then wioll be admitted for observation.   Wilkie AyePatrick Kadeem Hyle 06/28/2016, 1:03 AM

## 2016-07-14 NOTE — Discharge Instructions (Signed)
Post Anesthesia Home Care Instructions  Activity: Get plenty of rest for the remainder of the day. A responsible individual must stay with you for 24 hours following the procedure.  For the next 24 hours, DO NOT: -Drive a car -Operate machinery -Drink alcoholic beverages -Take any medication unless instructed by your physician -Make any legal decisions or sign important papers.  Meals: Start with liquid foods such as gelatin or soup. Progress to regular foods as tolerated. Avoid greasy, spicy, heavy foods. If nausea and/or vomiting occur, drink only clear liquids until the nausea and/or vomiting subsides. Call your physician if vomiting continues.  Special Instructions/Symptoms: Your throat may feel dry or sore from the anesthesia or the breathing tube placed in your throat during surgery. If this causes discomfort, gargle with warm salt water. The discomfort should disappear within 24 hours.  If you had a scopolamine patch placed behind your ear for the management of post- operative nausea and/or vomiting:  1. The medication in the patch is effective for 72 hours, after which it should be removed.  Wrap patch in a tissue and discard in the trash. Wash hands thoroughly with soap and water. 2. You may remove the patch earlier than 72 hours if you experience unpleasant side effects which may include dry mouth, dizziness or visual disturbances. 3. Avoid touching the patch. Wash your hands with soap and water after contact with the patch.      [facility] [phone] Post Ureteroscopy With or Without Stent Instructions  Definitions:  Ureter: The duct that transports urine from the kidney to the bladder. Stent:   A plastic hollow tube that is placed into the ureter, from the kidney to the bladder to prevent the ureter from swelling shut.  GENERAL INSTRUCTIONS:  Despite the fact that no skin incisions were used, the area around the ureter and bladder is raw and  irritated. The stent is a foreign body which will further irritate the bladder wall. This irritation is manifested by increased frequency of urination, both day and night, and by an increase in the urge to urinate. In some, the urge to urinate is present almost always. Sometimes the urge is strong enough that you may not be able to stop yourself from urinating. The only real cure is to remove the stent and then give time for the bladder wall to heal which can't be done until the danger of the ureter swelling shut has passed, which varies.  You may see some blood in your urine while the stent is in place and a few days afterwards. Do not be alarmed, even if the urine was clear for a while. Get off your feet and drink lots of fluids until clearing occurs. If you start to pass clots or don't improve, call us.  DIET: You may return to your normal diet immediately. Because of the raw surface of your bladder, alcohol, spicy foods, acid type foods and drinks with caffeine may cause irritation or frequency and should be used in moderation. To keep your urine flowing freely and to avoid constipation, drink plenty of fluids during the day ( 8-10 glasses ). Tip: Avoid cranberry juice because it is very acidic.  ACTIVITY: Your physical activity doesn't need to be restricted. However, if you are very active, you may see some blood in your urine. We suggest that you reduce your activity under these circumstances until the bleeding has stopped.  BOWELS: It is important to keep your bowels regular during the postoperative period. Straining   with bowel movements can cause bleeding. A bowel movement every other day is reasonable. Use a mild laxative if needed, such as Milk of Magnesia 2-3 tablespoons, or 2 Dulcolax tablets. Call if you continue to have problems. If you have been taking narcotics for pain, before, during or after your surgery, you may be constipated. Take a laxative if necessary.   MEDICATION: You  should resume your pre-surgery medications unless told not to. In addition you will often be given an antibiotic to prevent infection. These should be taken as prescribed until the bottles are finished unless you are having an unusual reaction to one of the drugs.  PROBLEMS YOU SHOULD REPORT TO US: Fevers over 100.5 Fahrenheit. Heavy bleeding, or clots ( See above notes about blood in urine ). Inability to urinate. Drug reactions ( hives, rash, nausea, vomiting, diarrhea ). Severe burning or pain with urination that is not improving.  FOLLOW-UP: You will need a follow-up appointment to monitor your progress. Call for this appointment at the number listed above. Usually the first appointment will be about three to fourteen days after your surgery.      

## 2016-07-14 NOTE — Discharge Summary (Signed)
Physician Discharge Summary  Patient ID: Mary FurlongDianne [name] MRN: [id number] DOB/AGE: 52/07/1965 52 y.o.  Admit date: 06/27/2016 Discharge date: 06/28/2016 Admission Diagnoses: Right ureteral calculus Discharge Diagnoses:  Active Problems:   Ureteral calculus   Discharged Condition: good  Hospital Course: The patient tolerated the procedure well and was transferred to the floor on IV pain meds, IV fluid. On POD#1 foley was removed, pt was started on clear liquid diet and they ambulated in the halls. Prior to discharge the pt was tolerating a regular diet, pain was controlled on PO pain meds, they were ambulating without difficulty, and they had normal bowel function.   Consults: None  Significant Diagnostic Studies: none  Treatments: surgery: right ureteral stent change  Discharge Exam: Blood pressure 139/62, pulse 70, temperature 98.6 F (37 C), temperature source Oral, resp. rate 16, SpO2 96 %. General appearance: alert, cooperative and appears stated age Head: Normocephalic, without obvious abnormality, atraumatic Nose: Nares normal. Septum midline. Mucosa normal. No drainage or sinus tenderness. Neck: no adenopathy, no carotid bruit, no JVD, supple, symmetrical, trachea midline and thyroid not enlarged, symmetric, no tenderness/mass/nodules Resp: clear to auscultation bilaterally Cardio: regular rate and rhythm, S1, S2 normal, no murmur, click, rub or gallop GI: soft, non-tender; bowel sounds normal; no masses,  no organomegaly Extremities: extremities normal, atraumatic, no cyanosis or edema Neurologic: Grossly normal  Disposition: 01-Home or Self Care  Discharge Instructions    Discharge patient    Complete by:  As directed    Discharge disposition:  01-Home or Self Care   Discharge patient date:  06/28/2016     Allergies as of 06/28/2016   No Known Allergies     Medication List    TAKE these medications   ALEVE 220 MG tablet Generic drug:  naproxen sodium Take  220-440 mg by mouth 2 (two) times daily as needed (for headaches).   baclofen 10 MG tablet Commonly known as:  LIORESAL Take 1 tablet (10 mg total) by mouth 3 (three) times daily.   ondansetron 4 MG disintegrating tablet Commonly known as:  ZOFRAN ODT Take 1 tablet (4 mg total) by mouth every 8 (eight) hours as needed for nausea or vomiting.   oxyCODONE-acetaminophen 5-325 MG tablet Commonly known as:  ROXICET Take 1 tablet by mouth every 4 (four) hours as needed for severe pain.   pantoprazole 40 MG tablet Commonly known as:  PROTONIX Take 1 tablet (40 mg total) by mouth daily. What changed:  when to take this   QUEtiapine 300 MG tablet Commonly known as:  SEROQUEL Take 300 mg by mouth at bedtime.   sertraline 25 MG tablet Commonly known as:  ZOLOFT Take 25 mg by mouth every morning.   sucralfate 1 g tablet Commonly known as:  CARAFATE Take 1 tablet (1 g total) by mouth 4 (four) times daily -  with meals and at bedtime.      Follow-up Information    Wilkie AyePatrick McKenzie, MD. Call in 2 weeks.   Specialty:  Urology Why:  surgery Contact information: [address] [place] [address] [phone]           Signed: Wilkie Ayeatrick McKenzie 07/14/2016, 3:28 PM

## 2016-07-14 NOTE — Anesthesia Preprocedure Evaluation (Addendum)
Anesthesia Evaluation  Patient identified by MRN, date of birth, ID band Patient awake    Reviewed: Allergy & Precautions, H&P , Patient's Chart, lab work & pertinent test results, reviewed documented beta blocker date and time   Airway Mallampati: II  TM Distance: >3 FB Neck ROM: full    Dental no notable dental hx.    Pulmonary Current Smoker,    Pulmonary exam normal breath sounds clear to auscultation       Cardiovascular  Rhythm:regular Rate:Normal     Neuro/Psych    GI/Hepatic   Endo/Other    Renal/GU      Musculoskeletal   Abdominal   Peds  Hematology   Anesthesia Other Findings   Reproductive/Obstetrics                             Anesthesia Physical Anesthesia Plan  ASA: II  Anesthesia Plan: General   Post-op Pain Management:    Induction: Intravenous  Airway Management Planned: Oral ETT and LMA  Additional Equipment:   Intra-op Plan:   Post-operative Plan: Extubation in OR  Informed Consent: I have reviewed the patients History and Physical, chart, labs and discussed the procedure including the risks, benefits and alternatives for the proposed anesthesia with the patient or authorized representative who has indicated his/her understanding and acceptance.   Dental Advisory Given  Plan Discussed with: CRNA and Surgeon  Anesthesia Plan Comments: (  )       Anesthesia Quick Evaluation

## 2016-07-14 NOTE — Interval H&P Note (Signed)
History and Physical Interval Note:  07/14/2016 2:36 PM  Mary [name]  has presented today for surgery, with the diagnosis of RIGHT URETERAL STONE  The various methods of treatment have been discussed with the patient and family. After consideration of risks, benefits and other options for treatment, the patient has consented to  Procedure(s): CYSTOSCOPY/RETROGRADE/URETEROSCOPY/STONE EXTRACTION WITH BASKET (Right) HOLMIUM LASER APPLICATION (Right) CYSTOSCOPY WITH STENT REPLACEMENT (Right) as a surgical intervention .  The patient's history has been reviewed, patient examined, no change in status, stable for surgery.  I have reviewed the patient's chart and labs.  Questions were answered to the patient's satisfaction.     Wilkie AyePatrick Antonette Hendricks

## 2016-07-14 NOTE — Brief Op Note (Signed)
07/14/2016  4:41 PM  PATIENT:  Mary [name]  51 y.o. female  PRE-OPERATIVE DIAGNOSIS:  RIGHT URETERAL STONE  POST-OPERATIVE DIAGNOSIS:  RIGHT URETERAL STONE  PROCEDURE:  Procedure(s): CYSTOSCOPY/RETROGRADE/URETEROSCOPY/STONE EXTRACTION WITH BASKET (Right) HOLMIUM LASER APPLICATION (Right) CYSTOSCOPY WITH STENT REPLACEMENT (Right)  SURGEON:  Surgeon(s) and Role:    * Malen GauzePatrick L Tahjae Clausing, MD - Primary  PHYSICIAN ASSISTANT:   ASSISTANTS: none   ANESTHESIA:   general  EBL:  Total I/O In: 800 [I.V.:800] Out: -   BLOOD ADMINISTERED:none  DRAINS: right 6x26 JJ ureteral stent without tether   LOCAL MEDICATIONS USED:  NONE  SPECIMEN:  Source of Specimen:  right ureteral calculus  DISPOSITION OF SPECIMEN:  N/A  COUNTS:  YES  TOURNIQUET:  * No tourniquets in log *  DICTATION: .Note written in EPIC  PLAN OF CARE: Discharge to home after PACU  PATIENT DISPOSITION:  PACU - hemodynamically stable.   Delay start of Pharmacological VTE agent (>24hrs) due to surgical blood loss or risk of bleeding: not applicable

## 2016-07-14 NOTE — Anesthesia Postprocedure Evaluation (Addendum)
Anesthesia Post Note  Patient: Deveron FurlongDianne Havey  Procedure(s) Performed: Procedure(s) (LRB): CYSTOSCOPY/RETROGRADE/URETEROSCOPY/STONE EXTRACTION WITH BASKET (Right) HOLMIUM LASER APPLICATION (Right) CYSTOSCOPY WITH STENT REPLACEMENT (Right)  Patient location during evaluation: PACU Anesthesia Type: General Level of consciousness: sedated Pain management: pain level controlled Vital Signs Assessment: post-procedure vital signs reviewed and stable Respiratory status: spontaneous breathing and respiratory function stable Cardiovascular status: stable Anesthetic complications: no       Last Vitals:  Vitals:   07/14/16 1730 07/14/16 1743  BP: 114/70 112/64  Pulse: 76 73  Resp: 18 13  Temp:      Last Pain:  Vitals:   07/14/16 1715  TempSrc:   PainSc: Asleep                 Jackqulyn Mendel DANIEL

## 2016-07-14 NOTE — Anesthesia Procedure Notes (Signed)
Procedure Name: Intubation Date/Time: 07/14/2016 4:13 PM Performed by: Duane Boston Pre-anesthesia Checklist: Patient identified, Emergency Drugs available, Suction available and Patient being monitored Patient Re-evaluated:Patient Re-evaluated prior to inductionOxygen Delivery Method: Circle system utilized Preoxygenation: Pre-oxygenation with 100% oxygen Intubation Type: IV induction, Cricoid Pressure applied and Rapid sequence Ventilation: Mask ventilation without difficulty Laryngoscope Size: Mac and 3 Grade View: Grade II Tube type: Oral Tube size: 7.0 mm Number of attempts: 1 Airway Equipment and Method: Stylet and Oral airway Placement Confirmation: ETT inserted through vocal cords under direct vision,  positive ETCO2 and breath sounds checked- equal and bilateral Secured at: 21 cm Tube secured with: Tape Dental Injury: Teeth and Oropharynx as per pre-operative assessment

## 2016-07-14 NOTE — Anesthesia Postprocedure Evaluation (Deleted)
Anesthesia Post Note  Patient: Deveron FurlongDianne Zendejas  Procedure(s) Performed: Procedure(s) (LRB): CYSTOSCOPY/RETROGRADE/URETEROSCOPY/STONE EXTRACTION WITH BASKET (Right) HOLMIUM LASER APPLICATION (Right) CYSTOSCOPY WITH STENT REPLACEMENT (Right)  Patient location during evaluation: PACU Anesthesia Type: General Level of consciousness: sedated Pain management: pain level controlled Vital Signs Assessment: post-procedure vital signs reviewed and stable Respiratory status: spontaneous breathing and respiratory function stable Cardiovascular status: stable Anesthetic complications: no       Last Vitals:  Vitals:   07/14/16 1229 07/14/16 1650  BP: 116/83 114/66  Pulse: 84 80  Resp: 16 16  Temp: 36.5 C 36.8 C    Last Pain:  Vitals:   07/14/16 1254  TempSrc:   PainSc: 5                  Korianna Washer DANIEL

## 2016-07-14 NOTE — Transfer of Care (Signed)
  Last Vitals:  Vitals:   07/14/16 1229 07/14/16 1650  BP: 116/83 114/66  Pulse: 84 80  Resp: 16 16  Temp: 36.5 C 36.8 C    Last Pain:  Vitals:   07/14/16 1254  TempSrc:   PainSc: 5       Patients Stated Pain Goal: 5 (07/14/16 1254)  Immediate Anesthesia Transfer of Care Note  Patient: Mary [name]  Procedure(s) Performed: Procedure(s) (LRB): CYSTOSCOPY/RETROGRADE/URETEROSCOPY/STONE EXTRACTION WITH BASKET (Right) HOLMIUM LASER APPLICATION (Right) CYSTOSCOPY WITH STENT REPLACEMENT (Right)  Patient Location: PACU  Anesthesia Type: General  Level of Consciousness: awake, alert  and oriented  Airway & Oxygen Therapy: Patient Spontanous Breathing and Patient connected to  oxygen  Post-op Assessment: Report given to PACU RN and Post -op Vital signs reviewed and stable  Post vital signs: Reviewed and stable  Complications: No apparent anesthesia complications

## 2016-07-16 NOTE — Op Note (Signed)
Preoperative diagnosis: Right ureteral stone  Postoperative diagnosis: Same  Procedure: 1 cystoscopy 2 right retrograde pyelography 3.  Intraoperative fluoroscopy, under one hour, with interpretation 4.  Right ureteroscopic stone manipulation with laser lithotripsy 5.  Right 6 x 26 JJ stent exchnage  Attending: Cleda MccreedyPatrick Mackenzie  Anesthesia: General  Estimated blood loss: None  Drains: Right 6 x 26 JJ ureteral stent without tether  Specimens: stone for analysis  Antibiotics: rocephin  Findings: severely impacted right distal ureteral calculus. No hydronephrosis.  Indications: Patient is a 51 year old female with a history of ureteral stone and who has failed medical expulsive therapy and had a ureteral stent placed for intractable pain.  After discussing treatment options, she decided proceed with right ureteroscopic stone manipulation.  Procedure her in detail: The patient was brought to the operating room and a brief timeout was done to ensure correct patient, correct procedure, correct site.  General anesthesia was administered patient was placed in dorsal lithotomy position.  Her genitalia was then prepped and draped in usual sterile fashion.  A rigid 22 French cystoscope was passed in the urethra and the bladder.  Bladder was inspected free masses or lesions.  the  ureteral orifices were in the normal orthotopic locations. Using a grasper the ureteral stent was brought to the urethral meatus. A zipwire was advanced through the stent and up to the renal pelvis. The stent was then removed. a 6 french ureteral catheter was then instilled into the right ureter orifice.  a gentle retrograde was obtained and findings noted above.  we then removed the cystoscope and cannulated the right ureteral orifice with a semirigid ureteroscope.  we then encountered the stone in the mid ureter.  using using a 200 nm laser fiber and fragmented the stone into smaller pieces.  the pieces were then removed  with a engage basket.  We then placed and good coil was noted in the the renal pelvis under fluoroscopy and the bladder under direct vision.   once all stone fragments were removed we then placed a 6 x 26 double-j ureteral stent over the original zip wire.  the stone fragments were then removed from the bladder and sent for analysis.   the bladder was then drained and this concluded the procedure which was well tolerated by patient.  Complications: None  Condition: Stable, extubated, transferred to PACU  Plan: Patient is to be discharged home as to follow-up in 2 weeks for stent removal.

## 2016-07-18 ENCOUNTER — Ambulatory Visit: Payer: BLUE CROSS/BLUE SHIELD | Admitting: Nurse Practitioner

## 2016-07-18 ENCOUNTER — Encounter (HOSPITAL_BASED_OUTPATIENT_CLINIC_OR_DEPARTMENT_OTHER): Payer: Self-pay | Admitting: Urology

## 2016-07-24 ENCOUNTER — Ambulatory Visit: Payer: BLUE CROSS/BLUE SHIELD | Admitting: Nurse Practitioner

## 2016-07-25 DIAGNOSIS — N200 Calculus of kidney: Secondary | ICD-10-CM | POA: Diagnosis not present

## 2016-07-26 ENCOUNTER — Telehealth: Payer: Self-pay | Admitting: Nurse Practitioner

## 2016-07-26 NOTE — Telephone Encounter (Signed)
Pt no-showed for her two week follow up (for bronchitis and a rash) that was scheduled for 07/24/2016 at 4:00pm. How do you want to proceed?

## 2016-07-26 NOTE — Telephone Encounter (Signed)
Follow office policy   

## 2016-07-26 NOTE — Telephone Encounter (Signed)
LM for pt to call back to reschedule.

## 2016-08-12 NOTE — Addendum Note (Signed)
Addendum  created 08/12/16 0921 by Heather RobertsSinger, Price Lachapelle, MD   Sign clinical note

## 2016-08-24 DIAGNOSIS — N200 Calculus of kidney: Secondary | ICD-10-CM | POA: Diagnosis not present

## 2016-09-02 ENCOUNTER — Inpatient Hospital Stay (HOSPITAL_COMMUNITY)
Admission: AD | Admit: 2016-09-02 | Discharge: 2016-09-02 | Disposition: A | Discharge status: Home / Self Care | Payer: BLUE CROSS/BLUE SHIELD | Source: Ambulatory Visit | Attending: Obstetrics and Gynecology | Admitting: Obstetrics and Gynecology

## 2016-09-02 ENCOUNTER — Encounter (HOSPITAL_COMMUNITY): Payer: Self-pay | Admitting: *Deleted

## 2016-09-02 DIAGNOSIS — Z915 Personal history of self-harm: Secondary | ICD-10-CM | POA: Diagnosis not present

## 2016-09-02 DIAGNOSIS — F603 Borderline personality disorder: Secondary | ICD-10-CM | POA: Insufficient documentation

## 2016-09-02 DIAGNOSIS — J449 Chronic obstructive pulmonary disease, unspecified: Secondary | ICD-10-CM | POA: Diagnosis not present

## 2016-09-02 DIAGNOSIS — Z811 Family history of alcohol abuse and dependence: Secondary | ICD-10-CM | POA: Diagnosis not present

## 2016-09-02 DIAGNOSIS — N760 Acute vaginitis: Secondary | ICD-10-CM | POA: Insufficient documentation

## 2016-09-02 DIAGNOSIS — F4310 Post-traumatic stress disorder, unspecified: Secondary | ICD-10-CM | POA: Diagnosis not present

## 2016-09-02 DIAGNOSIS — Z9081 Acquired absence of spleen: Secondary | ICD-10-CM | POA: Diagnosis not present

## 2016-09-02 DIAGNOSIS — E559 Vitamin D deficiency, unspecified: Secondary | ICD-10-CM | POA: Diagnosis not present

## 2016-09-02 DIAGNOSIS — B9689 Other specified bacterial agents as the cause of diseases classified elsewhere: Secondary | ICD-10-CM | POA: Insufficient documentation

## 2016-09-02 DIAGNOSIS — N898 Other specified noninflammatory disorders of vagina: Secondary | ICD-10-CM | POA: Diagnosis present

## 2016-09-02 DIAGNOSIS — F209 Schizophrenia, unspecified: Secondary | ICD-10-CM | POA: Insufficient documentation

## 2016-09-02 DIAGNOSIS — F17210 Nicotine dependence, cigarettes, uncomplicated: Secondary | ICD-10-CM | POA: Diagnosis not present

## 2016-09-02 DIAGNOSIS — Z8619 Personal history of other infectious and parasitic diseases: Secondary | ICD-10-CM | POA: Insufficient documentation

## 2016-09-02 DIAGNOSIS — Z87442 Personal history of urinary calculi: Secondary | ICD-10-CM | POA: Diagnosis not present

## 2016-09-02 DIAGNOSIS — K219 Gastro-esophageal reflux disease without esophagitis: Secondary | ICD-10-CM | POA: Insufficient documentation

## 2016-09-02 DIAGNOSIS — F319 Bipolar disorder, unspecified: Secondary | ICD-10-CM | POA: Insufficient documentation

## 2016-09-02 DIAGNOSIS — E210 Primary hyperparathyroidism: Secondary | ICD-10-CM | POA: Diagnosis not present

## 2016-09-02 LAB — URINALYSIS, ROUTINE W REFLEX MICROSCOPIC
BILIRUBIN URINE: NEGATIVE
Glucose, UA: NEGATIVE mg/dL
Hgb urine dipstick: NEGATIVE
Ketones, ur: NEGATIVE mg/dL
LEUKOCYTES UA: NEGATIVE
NITRITE: NEGATIVE
PH: 7 (ref 5.0–8.0)
Protein, ur: NEGATIVE mg/dL
SPECIFIC GRAVITY, URINE: 1.017 (ref 1.005–1.030)

## 2016-09-02 LAB — WET PREP, GENITAL
SPERM: NONE SEEN
TRICH WET PREP: NONE SEEN
Yeast Wet Prep HPF POC: NONE SEEN

## 2016-09-02 LAB — POCT PREGNANCY, URINE: Preg Test, Ur: NEGATIVE

## 2016-09-02 MED ORDER — TERCONAZOLE 0.4 % VA CREA: 1.0000 | TOPICAL_CREAM | Freq: Every day | VAGINAL | 0 refills | Status: DC

## 2016-09-02 MED ORDER — METRONIDAZOLE 500 MG PO TABS
500.0000 mg | ORAL_TABLET | Freq: Two times a day (BID) | ORAL | 0 refills | Status: DC
Start: 2016-09-02 — End: 2016-11-20

## 2016-09-02 MED ORDER — FLUCONAZOLE 150 MG PO TABS: 150.0000 mg | ORAL_TABLET | Freq: Every day | ORAL | 0 refills | Status: DC

## 2016-09-02 NOTE — MAU Note (Signed)
Pt. Here with complaint of vaginal discharge, yellowish, itching, Pt. Feels like it may be an "STD".  "My boyfriend  works out of town and he may be doing something."

## 2016-09-02 NOTE — MAU Provider Note (Signed)
History     CSN: [id number]  Arrival date and time: 09/02/16 1359   First Provider Initiated Contact with Patient 09/02/16 1430      Chief Complaint  Patient presents with  . Vaginal Discharge   HPI Mary [name] 51 y.o.  Comes to MAU today with vaginal itching that she has had for 2 weeks.  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  States she is clean and does not drink alcohol or use drugs.  Thinks she may be menopausal - menses are about every 5 months and is very light bleeding.   OB History    Gravida Para Term Preterm AB Living   5         5   SAB TAB Ectopic Multiple Live Births           5     Additionally reports she has tested positive for syphilis before.  Will check today for titers.  States "her body cleared the Hep C"  Past Medical History:  Diagnosis Date  . Bipolar depression (HCC)   . Borderline personality disorder   . COPD (chronic obstructive pulmonary disease) (HCC)   . GERD (gastroesophageal reflux disease)   . Hiatal hernia   . History of alcohol abuse    per pt none since 10/ 2014  . History of attempted suicide   . History of drug abuse in remission    per pt in remission since 12-26-2012  polysubstance dependence (alcohol, crack, cocaine, 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  . History of hepatitis C per pt first dx 2009 (approx)   consulted w/ dr comer (infectious disease) note 04-26-2016 , pt states was called and told per lab work no longer has hepatitis c   . Hypercalcemia    mild and long-standing--- pt asymptomatic  . Primary hyperparathyroidism [facility](HCC)    endocrinology --  dr Lucianne Musskumar  . Pruritic erythematous rash    lower legs  . PTSD (post-traumatic stress disorder)   . Right ureteral stone   . Schizophrenia (HCC)   . Vitamin D deficiency     Past Surgical History:  Procedure Laterality Date  . CYSTOSCOPY W/ URETERAL STENT PLACEMENT Right 07/14/2016   Procedure: CYSTOSCOPY WITH STENT REPLACEMENT;   Surgeon: Malen GauzeMcKenzie, Patrick L, MD;  [facility] [place];  Service: Urology;  Laterality: Right;  . CYSTOSCOPY WITH RETROGRADE PYELOGRAM, URETEROSCOPY AND STENT PLACEMENT Right 06/28/2016   Procedure: CYSTOSCOPY WITH RETROGRADE PYELOGRAM, URETEROSCOPY AND STENT PLACEMENT;  Surgeon: Malen GauzePatrick L McKenzie, MD;  Location: [facility];  Service: Urology;  Laterality: Right;  . CYSTOSCOPY/RETROGRADE/URETEROSCOPY/STONE EXTRACTION WITH BASKET Right 07/14/2016   Procedure: CYSTOSCOPY/RETROGRADE/URETEROSCOPY/STONE EXTRACTION WITH BASKET;  Surgeon: Malen GauzeMcKenzie, Patrick L, MD;  [facility] [place];  Service: Urology;  Laterality: Right;  . DIAGNOSTIC LAPAROSCOPIC LIVER BIOPSY N/A 02/10/2013   Procedure: DIAGNOSTIC LAPAROSCOPIC ;  Surgeon: Ardeth SportsmanSteven C. Gross, MD;  Location: [facility];  Service: General;  Laterality: N/A;  DIAGNOSTIC LAPAROSCOPY,laparoscopic ventral hernia repair with mesh lysis of adhesions  . HOLMIUM LASER APPLICATION Right 07/14/2016   Procedure: HOLMIUM LASER APPLICATION;  Surgeon: Malen GauzeMcKenzie, Patrick L, MD;  [facility] [place];  Service: Urology;  Laterality: Right;  . SPLENECTOMY  2004  . TUBAL LIGATION      Family History  Problem Relation Age of Onset  . Alcohol abuse Mother   . Alcohol abuse Father   . Cancer Maternal Grandmother        colon  .  Diabetes Maternal Grandmother   . Hypertension Maternal Grandmother   . Alcohol abuse Sister   . Thyroid disease Neg Hx     Social History  Substance Use Topics  . Smoking status: Current Every Day Smoker    Packs/day: 0.50    Years: 32.00    Types: Cigarettes    Last attempt to quit: 04/13/2016  . Smokeless tobacco: Never Used  . Alcohol use No     Comment: quit 12/2012    Allergies: No Known Allergies  Prescriptions Prior to Admission  Medication Sig Dispense Refill Last Dose  . albuterol (PROVENTIL HFA;VENTOLIN HFA) 108 (90 Base) MCG/ACT inhaler Inhale 2 puffs into the lungs every 6 (six) hours  as needed for wheezing or shortness of breath. 1 Inhaler 1 07/13/2016 at Unknown time  . baclofen (LIORESAL) 10 MG tablet Take 1 tablet (10 mg total) by mouth 3 (three) times daily. 45 each 1 Past Week at Unknown time  . clotrimazole-betamethasone (LOTRISONE) cream Apply 1 application topically 2 (two) times daily. 45 g 1 07/14/2016 at Unknown time  . fluticasone-salmeterol (ADVAIR HFA) 230-21 MCG/ACT inhaler Inhale 2 puffs into the lungs 2 (two) times daily. Rinse mouth after each use 1 Inhaler 2 07/03/2016  . ibuprofen (ADVIL,MOTRIN) 200 MG tablet Take 200 mg by mouth every 6 (six) hours as needed.   07/12/2016  . naproxen sodium (ALEVE) 220 MG tablet Take 220-440 mg by mouth 2 (two) times daily as needed (for headaches).   07/12/2016  . ondansetron (ZOFRAN ODT) 4 MG disintegrating tablet Take 1 tablet (4 mg total) by mouth every 8 (eight) hours as needed for nausea or vomiting. 30 tablet 0 07/11/2016  . oxyCODONE-acetaminophen (ROXICET) 5-325 MG tablet Take 1 tablet by mouth every 4 (four) hours as needed for severe pain. 30 tablet 0   . pantoprazole (PROTONIX) 40 MG tablet Take 1 tablet (40 mg total) by mouth daily. (Patient taking differently: Take 40 mg by mouth every morning. ) 30 tablet 3 07/12/2016  . QUEtiapine (SEROQUEL) 300 MG tablet Take 300 mg by mouth at bedtime.   07/13/2016 at Unknown time  . ranitidine (ZANTAC) 300 MG tablet Take 1 tablet (300 mg total) by mouth at bedtime. 30 tablet 1 Unknown at Unknown time  . sertraline (ZOLOFT) 25 MG tablet Take 25 mg by mouth every morning.    07/14/2016 at 0700  . sucralfate (CARAFATE) 1 g tablet Take 1 tablet (1 g total) by mouth 4 (four) times daily -  with meals and at bedtime. 40 tablet 0 More than a month at Unknown time    Review of Systems  Constitutional: Negative for fever.  Gastrointestinal: Negative for abdominal pain, nausea and vomiting.  Genitourinary: Positive for vaginal discharge. Negative for vaginal bleeding.       Vaginal itching    Physical Exam   Blood pressure 109/64, pulse 79, temperature 98.4 F (36.9 C), temperature source Oral, resp. rate 20, height 5\' 4"  (1.626 m), weight 185 lb (83.9 kg), SpO2 95 %.  Physical Exam  Nursing note and vitals reviewed. Constitutional: She is oriented to person, place, and time. She appears well-developed and well-nourished.  HENT:  Head: Normocephalic.  Eyes: EOM are normal.  Neck: Neck supple.  GI: Soft. There is no tenderness.  Genitourinary:  Genitourinary Comments: Speculum exam: Vagina - bright pink with small amount of pale yellow  Discharge Cervix - No contact bleeding Bimanual exam: Cervix closed Uterus non tender, normal size Adnexa non tender, no masses bilaterally  GC/Chlam, wet prep done Chaperone present for exam.   Musculoskeletal: Normal range of motion.  Neurological: She is alert and oriented to person, place, and time.  Skin: Skin is warm and dry.  Psychiatric: She has a normal mood and affect.    MAU Course  Procedures Results for orders placed or performed during the hospital encounter of 09/02/16 (from the past 24 hour(s))  Urinalysis, Routine w reflex microscopic     Status: Abnormal   Collection Time: 09/02/16  2:00 PM  Result Value Ref Range   Color, Urine YELLOW YELLOW   APPearance CLOUDY (A) CLEAR   Specific Gravity, Urine 1.017 1.005 - 1.030   pH 7.0 5.0 - 8.0   Glucose, UA NEGATIVE NEGATIVE mg/dL   Hgb urine dipstick NEGATIVE NEGATIVE   Bilirubin Urine NEGATIVE NEGATIVE   Ketones, ur NEGATIVE NEGATIVE mg/dL   Protein, ur NEGATIVE NEGATIVE mg/dL   Nitrite NEGATIVE NEGATIVE   Leukocytes, UA NEGATIVE NEGATIVE  Pregnancy, urine POC     Status: None   Collection Time: 09/02/16  2:13 PM  Result Value Ref Range   Preg Test, Ur NEGATIVE NEGATIVE  Wet prep, genital     Status: Abnormal   Collection Time: 09/02/16  2:31 PM  Result Value Ref Range   Yeast Wet Prep HPF POC NONE SEEN NONE SEEN   Trich, Wet Prep NONE SEEN NONE SEEN    Clue Cells Wet Prep HPF POC PRESENT (A) NONE SEEN   WBC, Wet Prep HPF POC FEW (A) NONE SEEN   Sperm NONE SEEN     MDM Initially thought I would treat for yeast but with few WBC and clue ceslls, will treat for BV.  Possibly has trichomonas and medication for BV would cover it.  Discussed with client - she states she does not have a current partner and is familiar with trichomonas.  Is agreeable to treating with flagyl and states alcohol will not be a problem.  Assessment and Plan  Bacterial vaginosis  Plan Pick up your prescription for flagyl and take all as directed. Make an appointment at the health dept for further testing if the itching does not resolve. Continue to see your regular care provider for your other health care needs.  Annmargaret Decaprio L Sakib Noguez 09/02/2016, 2:44 PM

## 2016-09-02 NOTE — Discharge Instructions (Signed)
Get medication from your pharmacy and use as directed. If your itching continues, you need to [facility]. Keep your appointments with your providers as scheduled.

## 2016-09-03 LAB — HIV ANTIBODY (ROUTINE TESTING W REFLEX): HIV Screen 4th Generation wRfx: NONREACTIVE

## 2016-09-03 LAB — RPR, QUANT+TP ABS (REFLEX)
Rapid Plasma Reagin, Quant: 1:4 {titer} — ABNORMAL HIGH
T Pallidum Abs: POSITIVE — AB

## 2016-09-03 LAB — RPR: RPR: REACTIVE — AB

## 2016-09-04 LAB — GC/CHLAMYDIA PROBE AMP (~~LOC~~) NOT AT ARMC
Chlamydia: NEGATIVE
Neisseria Gonorrhea: NEGATIVE

## 2016-09-28 ENCOUNTER — Encounter (HOSPITAL_COMMUNITY): Payer: Self-pay | Admitting: Emergency Medicine

## 2016-09-28 ENCOUNTER — Emergency Department (HOSPITAL_COMMUNITY)
Admission: EM | Admit: 2016-09-28 | Discharge: 2016-09-28 | Disposition: A | Discharge status: Home / Self Care | Payer: BLUE CROSS/BLUE SHIELD | Attending: Emergency Medicine | Admitting: Emergency Medicine

## 2016-09-28 ENCOUNTER — Emergency Department (HOSPITAL_COMMUNITY): Payer: BLUE CROSS/BLUE SHIELD

## 2016-09-28 DIAGNOSIS — M5489 Other dorsalgia: Secondary | ICD-10-CM | POA: Diagnosis not present

## 2016-09-28 DIAGNOSIS — Z79899 Other long term (current) drug therapy: Secondary | ICD-10-CM | POA: Insufficient documentation

## 2016-09-28 DIAGNOSIS — M549 Dorsalgia, unspecified: Secondary | ICD-10-CM

## 2016-09-28 DIAGNOSIS — F17210 Nicotine dependence, cigarettes, uncomplicated: Secondary | ICD-10-CM | POA: Diagnosis not present

## 2016-09-28 DIAGNOSIS — R52 Pain, unspecified: Secondary | ICD-10-CM | POA: Diagnosis not present

## 2016-09-28 DIAGNOSIS — J449 Chronic obstructive pulmonary disease, unspecified: Secondary | ICD-10-CM | POA: Diagnosis not present

## 2016-09-28 DIAGNOSIS — M546 Pain in thoracic spine: Secondary | ICD-10-CM | POA: Diagnosis not present

## 2016-09-28 DIAGNOSIS — J9811 Atelectasis: Secondary | ICD-10-CM | POA: Diagnosis not present

## 2016-09-28 LAB — D-DIMER, QUANTITATIVE: D-Dimer, Quant: <0.27 ug/mL-FEU (ref 0.00–0.50)

## 2016-09-28 MED ORDER — GI COCKTAIL ~~LOC~~
30.0000 mL | Freq: Once | ORAL | Status: AC
  Administered 2016-09-28: 30 mL via ORAL
  Filled 2016-09-28: qty 30

## 2016-09-28 MED ORDER — CYCLOBENZAPRINE HCL 10 MG PO TABS: 15.0000 mg | ORAL_TABLET | Freq: Three times a day (TID) | ORAL | 0 refills | Status: DC | PRN

## 2016-09-28 MED ORDER — NAPROXEN 375 MG PO TABS: 375.0000 mg | ORAL_TABLET | Freq: Two times a day (BID) | ORAL | 0 refills | Status: DC

## 2016-09-28 MED ORDER — METHOCARBAMOL 500 MG PO TABS
750.0000 mg | ORAL_TABLET | Freq: Once | ORAL | Status: AC
  Administered 2016-09-28: 750 mg via ORAL
  Filled 2016-09-28: qty 2

## 2016-09-28 MED ORDER — DIAZEPAM 5 MG PO TABS: 5.0000 mg | ORAL_TABLET | Freq: Once | ORAL | Status: DC

## 2016-09-28 MED ORDER — NAPROXEN 250 MG PO TABS
375.0000 mg | ORAL_TABLET | Freq: Once | ORAL | Status: AC
  Administered 2016-09-28: 375 mg via ORAL
  Filled 2016-09-28: qty 2

## 2016-09-28 NOTE — ED Triage Notes (Signed)
Per EMS: pt sts upper back pain after lifting coffee pot; pt sts felt like spasm

## 2016-09-28 NOTE — ED Provider Notes (Signed)
[facility]-EMERGENCY DEPT Provider Note   CSN: [id number] Arrival date & time: 09/28/16  4098  By signing my name below, I, Linna Darner, attest that this documentation has been prepared under the direction and in the presence of Demetrios Loll, PA-C. Electronically Signed: Linna Darner, Scribe. 09/28/2016. 9:21 AM.  History   Chief Complaint Chief Complaint  Patient presents with  . Back Pain   The history is provided by the patient. No language interpreter was used.    HPI Comments: Mary [name] is a 51 y.o. female who presents to the Emergency Department via EMS complaining of sudden onset, constant, sharp upper back pain beginning shortly prior to arrival. Patient states she lifted a pot of coffee at work and experienced severe upper back pain immediately thereafter. She notes some mild dyspnea secondary to pain and a pre-existing cough productive of sputum. Her pain is worse with palpation and deep inhalation. No alleviating factors noted and no medications or treatments tried PTA. She has had transient episodes of similar back pain in the past but never as prolonged as her current onset. No personal or family h/o blood clots. No recent immobilizations or surgeries.  Patient denies leg swelling, fevers, chills, rhinorrhea, cp, Lightheadedness, dizziness or any other associated symptoms.  Past Medical History:  Diagnosis Date  . Bipolar depression (HCC)   . Borderline personality disorder   . COPD (chronic obstructive pulmonary disease) (HCC)   . GERD (gastroesophageal reflux disease)   . Hiatal hernia   . History of alcohol abuse    per pt none since 10/ 2014  . History of attempted suicide   . History of drug abuse in remission    per pt in remission since 12-26-2012  polysubstance dependence (alcohol, crack, cocaine, 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  . History of hepatitis C per pt first dx 2009 (approx)   consulted w/ dr comer (infectious disease) note 04-26-2016 ,  pt states was called and told per lab work no longer has hepatitis c   . Hypercalcemia    mild and long-standing--- pt asymptomatic  . Primary hyperparathyroidism [facility])    endocrinology --  dr Lucianne Muss  . Pruritic erythematous rash    lower legs  . PTSD (post-traumatic stress disorder)   . Right ureteral stone   . Schizophrenia (HCC)   . Vitamin D deficiency     Patient Active Problem List   Diagnosis Date Noted  . Ureteral calculus 06/28/2016  . Pruritic erythematous rash 04/12/2016  . Reactive airway disease that is not asthma 04/12/2016  . Ileus, postoperative (HCC) 02/13/2013  . Chronic hepatitis C without hepatic coma (HCC)   . Drug abuse and dependence (HCC)   . Incarcerated incisional hernia s/p lap repair 02/10/2013 02/10/2013  . Polysubstance dependence (HCC) 10/16/2011  . Substance induced mood disorder (HCC) 10/16/2011  . GERD without esophagitis 07/04/2011  . BV (bacterial vaginosis) 06/04/2011    Past Surgical History:  Procedure Laterality Date  . CYSTOSCOPY W/ URETERAL STENT PLACEMENT Right 07/14/2016   Procedure: CYSTOSCOPY WITH STENT REPLACEMENT;  Surgeon: Malen Gauze, MD;  Location: [facility];  Service: Urology;  Laterality: Right;  . CYSTOSCOPY WITH RETROGRADE PYELOGRAM, URETEROSCOPY AND STENT PLACEMENT Right 06/28/2016   Procedure: CYSTOSCOPY WITH RETROGRADE PYELOGRAM, URETEROSCOPY AND STENT PLACEMENT;  Surgeon: Malen Gauze, MD;  Location: [facility];  Service: Urology;  Laterality: Right;  . CYSTOSCOPY/RETROGRADE/URETEROSCOPY/STONE EXTRACTION WITH BASKET Right 07/14/2016   Procedure: CYSTOSCOPY/RETROGRADE/URETEROSCOPY/STONE EXTRACTION WITH BASKET;  Surgeon: Wilkie Aye  L, MD;  Location: [facility];  Service: Urology;  Laterality: Right;  . DIAGNOSTIC LAPAROSCOPIC LIVER BIOPSY N/A 02/10/2013   Procedure: DIAGNOSTIC LAPAROSCOPIC ;  Surgeon: Ardeth Sportsman, MD;  Location: [facility];  Service: General;  Laterality: N/A;   DIAGNOSTIC LAPAROSCOPY,laparoscopic ventral hernia repair with mesh lysis of adhesions  . HOLMIUM LASER APPLICATION Right 07/14/2016   Procedure: HOLMIUM LASER APPLICATION;  Surgeon: Malen Gauze, MD;  Location[facility];  Service: Urology;  Laterality: Right;  . SPLENECTOMY  2004  . TUBAL LIGATION      OB History    Gravida Para Term Preterm AB Living   5         5   SAB TAB Ectopic Multiple Live Births           5       Home Medications    Prior to Admission medications   Medication Sig Start Date End Date Taking? Authorizing Provider  albuterol (PROVENTIL HFA;VENTOLIN HFA) 108 (90 Base) MCG/ACT inhaler Inhale 2 puffs into the lungs every 6 (six) hours as needed for wheezing or shortness of breath. 07/04/16   Nche, Bonna Gains, NP  baclofen (LIORESAL) 10 MG tablet Take 1 tablet (10 mg total) by mouth 3 (three) times daily. 04/12/16   [place], Betty G, MD  clotrimazole-betamethasone (LOTRISONE) cream Apply 1 application topically 2 (two) times daily. 07/04/16   Nche, Bonna Gains, NP  fluticasone-salmeterol (ADVAIR HFA) 230-21 MCG/ACT inhaler Inhale 2 puffs into the lungs 2 (two) times daily. Rinse mouth after each use 07/04/16   Nche, Bonna Gains, NP  metroNIDAZOLE (FLAGYL) 500 MG tablet Take 1 tablet (500 mg total) by mouth 2 (two) times daily. No alcohol while taking this medication 09/02/16   Nolene Bernheim L, NP  naproxen sodium (ALEVE) 220 MG tablet Take 220-440 mg by mouth 2 (two) times daily as needed (for headaches).    [provider]  ondansetron (ZOFRAN ODT) 4 MG disintegrating tablet Take 1 tablet (4 mg total) by mouth every 8 (eight) hours as needed for nausea or vomiting. 06/28/16   McKenzie, Mardene Celeste, MD  oxyCODONE-acetaminophen (ROXICET) 5-325 MG tablet Take 1 tablet by mouth every 4 (four) hours as needed for severe pain. 07/14/16   McKenzie, Mardene Celeste, MD  pantoprazole (PROTONIX) 40 MG tablet Take 1 tablet (40 mg total) by mouth  daily. Patient taking differently: Take 40 mg by mouth every morning.  05/26/15   Gwyneth Sprout, MD  QUEtiapine (SEROQUEL) 300 MG tablet Take 300 mg by mouth at bedtime.    [provider]  ranitidine (ZANTAC) 300 MG tablet Take 1 tablet (300 mg total) by mouth at bedtime. 07/04/16   Nche, Bonna Gains, NP  sertraline (ZOLOFT) 25 MG tablet Take 25 mg by mouth every morning.     [provider]  sucralfate (CARAFATE) 1 g tablet Take 1 tablet (1 g total) by mouth 4 (four) times daily -  with meals and at bedtime. 01/27/16   Danelle Berry, PA-C    Family History Family History  Problem Relation Age of Onset  . Alcohol abuse Mother   . Alcohol abuse Father   . Cancer Maternal Grandmother        colon  . Diabetes Maternal Grandmother   . Hypertension Maternal Grandmother   . Alcohol abuse Sister   . Thyroid disease Neg Hx     Social History Social History  Substance Use Topics  . Smoking status: Current Every  Day Smoker    Packs/day: 0.50    Years: 32.00    Types: Cigarettes    Last attempt to quit: 04/13/2016  . Smokeless tobacco: Never Used  . Alcohol use No     Comment: quit 12/2012     Allergies   Patient has no known allergies.   Review of Systems Review of Systems  Constitutional: Negative for chills and fever.  HENT: Negative for rhinorrhea.   Respiratory: Positive for cough and shortness of breath.   Cardiovascular: Negative for leg swelling.  Musculoskeletal: Positive for back pain.   Physical Exam Updated Vital Signs BP 119/83 (BP Location: Right Arm)   Pulse 71   Temp 98.1 F (36.7 C) (Oral)   Resp 16   Ht 5\' 4"  (1.626 m)   Wt 83.9 kg (185 lb)   SpO2 97%   BMI 31.76 kg/m   Physical Exam  Constitutional: She is oriented to person, place, and time. She appears well-developed and well-nourished. No distress.  HENT:  Head: Normocephalic and atraumatic.  Eyes: Pupils are equal, round, and reactive to light. Conjunctivae are normal.   Neck: Normal range of motion. Neck supple. No tracheal deviation present.  Cardiovascular: Normal rate, regular rhythm, normal heart sounds and intact distal pulses.   Pulmonary/Chest: Effort normal and breath sounds normal. No respiratory distress. She has no wheezes. She has no rales. She exhibits no tenderness.  Musculoskeletal: Normal range of motion.       Arms: Tender palpation. No obvious deformity, ecchymosis, crepitus, edema, erythema.  No midline T spine or L spine tenderness. No deformities or step offs noted. Full ROM. Pelvis is stable.  No lower show any edema or calf tenderness.  Neurological: She is alert and oriented to person, place, and time.  Skin: Skin is warm and dry.  Psychiatric: She has a normal mood and affect. Her behavior is normal.  Nursing note and vitals reviewed.  ED Treatments / Results  Labs (all labs ordered are listed, but only abnormal results are displayed) Labs Reviewed  D-DIMER, QUANTITATIVE (NOT AT [facility])    EKG  EKG Interpretation None       Radiology Dg Chest 2 View  Result Date: 09/28/2016 CLINICAL DATA:  Back pain. EXAM: CHEST  2 VIEW COMPARISON:  Chest x-ray 01/27/2016. FINDINGS: Mediastinum and hilar structures are normal. Mild bibasilar subsegmental axis. No pleural effusion or pneumothorax. No acute bony abnormality . IMPRESSION: Mild bibasilar subsegmental atelectasis. Electronically Signed   By: Maisie Fushomas  Register   On: 09/28/2016 09:54    Procedures Procedures (including critical care time)  DIAGNOSTIC STUDIES: Oxygen Saturation is 97% on RA, normal by my interpretation.    COORDINATION OF CARE: 9:17 AM Discussed treatment plan with pt at bedside and pt agreed to plan.  Medications Ordered in ED Medications  naproxen (NAPROSYN) tablet 375 mg (375 mg Oral Given 09/28/16 1022)  methocarbamol (ROBAXIN) tablet 750 mg (750 mg Oral Given 09/28/16 1022)  gi cocktail (Maalox,Lidocaine,Donnatal) (30 mLs Oral Given 09/28/16 1058)      Initial Impression / Assessment and Plan / ED Course  I have reviewed the triage vital signs and the nursing notes.  Pertinent labs & imaging results that were available during my care of the patient were reviewed by me and considered in my medical decision making (see chart for details).     Patient withUpper left back pain that occurred after lifting a pot of coffee a work. Patient took EMS ride to the ED. History of same.  History of IV drug use and polysubstance abuse. Neurovascular intact in all extremities. Patient is ambulatory. Worse with movement and palpation. Cardiac and pulmonary exam is unremarkable. Clinical presentation is not concerning for PE however given age and d-dimer was ordered which was negative. Clinical presentation is not concerning for ACS. Chest x-ray unremarkable. Patient's symptoms likely due to musculoskeletal spasm. No fever, night sweats, weight loss, h/o cancer, no recent procedure to back. No urinary symptoms suggestive of UTI.  Supportive care and return precaution discussed. Patient felt much improved after naproxen and muscle relaxer. Patient did request a GI cocktail which I ordered for patient. Appears safe for discharge at this time. Follow up as indicated in discharge paperwork.   Final Clinical Impressions(s) / ED Diagnoses   Final diagnoses:  Upper back pain on left side    New Prescriptions Discharge Medication List as of 09/28/2016 11:16 AM    START taking these medications   Details  cyclobenzaprine (FLEXERIL) 10 MG tablet Take 1.5 tablets (15 mg total) by mouth 3 (three) times daily as needed for muscle spasms., Starting Thu 09/28/2016, Print    naproxen (NAPROSYN) 375 MG tablet Take 1 tablet (375 mg total) by mouth 2 (two) times daily., Starting Thu 09/28/2016, Print       I personally performed the services described in this documentation, which was scribed in my presence. The recorded information has been reviewed and is accurate.     Rise Mu, PA-C 09/28/16 1138    Cathren Laine, MD 09/28/16 1249

## 2016-09-28 NOTE — Discharge Instructions (Signed)
Your x-ray was unremarkable. The test for blood clot was negative.  Please take the Naproxen as prescribed for pain. Do not take any additional NSAIDs including Motrin, Aleve, Ibuprofen, Advil.  Please the the Flexeril for muscle relaxation. This medication will make you drowsy so avoid situation that could place you in danger.   Warm compresses to the area. Warm soaks in Epsom salt. Perform stretches. If symptoms persist return to the ED for evaluation follow-up with primary care doctor if symptoms are not improving.

## 2016-11-20 ENCOUNTER — Inpatient Hospital Stay (HOSPITAL_COMMUNITY): Payer: BLUE CROSS/BLUE SHIELD

## 2016-11-20 ENCOUNTER — Inpatient Hospital Stay (HOSPITAL_COMMUNITY)
Admission: AD | Admit: 2016-11-20 | Discharge: 2016-11-20 | Disposition: A | Discharge status: Home / Self Care | Payer: BLUE CROSS/BLUE SHIELD | Source: Ambulatory Visit | Attending: Obstetrics & Gynecology | Admitting: Obstetrics & Gynecology

## 2016-11-20 ENCOUNTER — Encounter (HOSPITAL_COMMUNITY): Payer: Self-pay | Admitting: *Deleted

## 2016-11-20 DIAGNOSIS — N926 Irregular menstruation, unspecified: Secondary | ICD-10-CM | POA: Diagnosis not present

## 2016-11-20 DIAGNOSIS — Z780 Asymptomatic menopausal state: Secondary | ICD-10-CM | POA: Insufficient documentation

## 2016-11-20 DIAGNOSIS — R1032 Left lower quadrant pain: Secondary | ICD-10-CM | POA: Insufficient documentation

## 2016-11-20 DIAGNOSIS — Z3202 Encounter for pregnancy test, result negative: Secondary | ICD-10-CM | POA: Insufficient documentation

## 2016-11-20 DIAGNOSIS — F17210 Nicotine dependence, cigarettes, uncomplicated: Secondary | ICD-10-CM | POA: Diagnosis not present

## 2016-11-20 DIAGNOSIS — R1030 Lower abdominal pain, unspecified: Secondary | ICD-10-CM

## 2016-11-20 LAB — CBC WITH DIFFERENTIAL/PLATELET
Basophils Absolute: 0.1 10*3/uL (ref 0.0–0.1)
Basophils Relative: 0 %
EOS PCT: 3 %
Eosinophils Absolute: 0.4 10*3/uL (ref 0.0–0.7)
HCT: 41.9 % (ref 36.0–46.0)
HEMOGLOBIN: 14.4 g/dL (ref 12.0–15.0)
LYMPHS ABS: 5.6 10*3/uL — AB (ref 0.7–4.0)
LYMPHS PCT: 40 %
MCH: 31.2 pg (ref 26.0–34.0)
MCHC: 34.4 g/dL (ref 30.0–36.0)
MCV: 90.9 fL (ref 78.0–100.0)
Monocytes Absolute: 0.5 10*3/uL (ref 0.1–1.0)
Monocytes Relative: 4 %
Neutro Abs: 7.4 10*3/uL (ref 1.7–7.7)
Neutrophils Relative %: 53 %
Platelets: 358 10*3/uL (ref 150–400)
RBC: 4.61 MIL/uL (ref 3.87–5.11)
RDW: 13.6 % (ref 11.5–15.5)
WBC: 13.9 10*3/uL — AB (ref 4.0–10.5)

## 2016-11-20 LAB — URINALYSIS, ROUTINE W REFLEX MICROSCOPIC
Bilirubin Urine: NEGATIVE
Glucose, UA: NEGATIVE mg/dL
Hgb urine dipstick: NEGATIVE
KETONES UR: NEGATIVE mg/dL
Nitrite: NEGATIVE
PROTEIN: NEGATIVE mg/dL
Specific Gravity, Urine: 1.02 (ref 1.005–1.030)
pH: 5 (ref 5.0–8.0)

## 2016-11-20 LAB — WET PREP, GENITAL
Clue Cells Wet Prep HPF POC: NONE SEEN
Sperm: NONE SEEN
TRICH WET PREP: NONE SEEN
Yeast Wet Prep HPF POC: NONE SEEN

## 2016-11-20 LAB — POCT PREGNANCY, URINE: PREG TEST UR: NEGATIVE

## 2016-11-20 LAB — RAPID HIV SCREEN (HIV 1/2 AB+AG)
HIV 1/2 Antibodies: NONREACTIVE
HIV-1 P24 ANTIGEN - HIV24: NONREACTIVE

## 2016-11-20 MED ORDER — KETOROLAC TROMETHAMINE 60 MG/2ML IM SOLN
60.0000 mg | Freq: Once | INTRAMUSCULAR | Status: AC
  Administered 2016-11-20: 60 mg via INTRAMUSCULAR
  Filled 2016-11-20: qty 2

## 2016-11-20 MED ORDER — DOCUSATE SODIUM 250 MG PO CAPS: ORAL_CAPSULE | ORAL | 0 refills | Status: DC

## 2016-11-20 MED ORDER — NAPROXEN 375 MG PO TABS: 375.0000 mg | ORAL_TABLET | Freq: Two times a day (BID) | ORAL | 0 refills | Status: DC

## 2016-11-20 NOTE — Discharge Instructions (Signed)
Abdominal Pain, Adult °Abdominal pain can be caused by many things. Often, abdominal pain is not serious and it gets better with no treatment or by being treated at home. However, sometimes abdominal pain is serious. Your health care provider will do a medical history and a physical exam to try to determine the cause of your abdominal pain. °Follow these instructions at home: °· Take over-the-counter and prescription medicines only as told by your health care provider. Do not take a laxative unless told by your health care provider. °· Drink enough fluid to keep your urine clear or pale yellow. °· Watch your condition for any changes. °· Keep all follow-up visits as told by your health care provider. This is important. °Contact a health care provider if: °· Your abdominal pain changes or gets worse. °· You are not hungry or you lose weight without trying. °· You are constipated or have diarrhea for more than 2-3 days. °· You have pain when you urinate or have a bowel movement. °· Your abdominal pain wakes you up at night. °· Your pain gets worse with meals, after eating, or with certain foods. °· You are throwing up and cannot keep anything down. °· You have a fever. °Get help right away if: °· Your pain does not go away as soon as your health care provider told you to expect. °· You cannot stop throwing up. °· Your pain is only in areas of the abdomen, such as the right side or the left lower portion of the abdomen. °· You have bloody or black stools, or stools that look like tar. °· You have severe pain, cramping, or bloating in your abdomen. °· You have signs of dehydration, such as: °¨ Dark urine, very little urine, or no urine. °¨ Cracked lips. °¨ Dry mouth. °¨ Sunken eyes. °¨ Sleepiness. °¨ Weakness. °This information is not intended to replace advice given to you by your health care provider. Make sure you discuss any questions you have with your health care provider. °Document Released: 12/07/2004 Document  Revised: 09/17/2015 Document Reviewed: 08/11/2015 °Elsevier Interactive Patient Education © 2017 Elsevier Inc. ° °Constipation, Adult °Constipation is when a person: °· Poops (has a bowel movement) fewer times in a week than normal. °· Has a hard time pooping. °· Has poop that is dry, hard, or bigger than normal. °Follow these instructions at home: °Eating and drinking °· Eat foods that have a lot of fiber, such as: °¨ Fresh fruits and vegetables. °¨ Whole grains. °¨ Beans. °· Eat less of foods that are high in fat, low in fiber, or overly processed, such as: °¨ French fries. °¨ Hamburgers. °¨ Cookies. °¨ Candy. °¨ Soda. °· Drink enough fluid to keep your pee (urine) clear or pale yellow. °General instructions °· Exercise regularly or as told by your doctor. °· Go to the restroom when you feel like you need to poop. Do not hold it in. °· Take over-the-counter and prescription medicines only as told by your doctor. These include any fiber supplements. °· Do pelvic floor retraining exercises, such as: °¨ Doing deep breathing while relaxing your lower belly (abdomen). °¨ Relaxing your pelvic floor while pooping. °· Watch your condition for any changes. °· Keep all follow-up visits as told by your doctor. This is important. °Contact a doctor if: °· You have pain that gets worse. °· You have a fever. °· You have not pooped for 4 days. °· You throw up (vomit). °· You are not hungry. °· You lose   weight. °· You are bleeding from the anus. °· You have thin, pencil-like poop (stool). °Get help right away if: °· You have a fever, and your symptoms suddenly get worse. °· You leak poop or have blood in your poop. °· Your belly feels hard or bigger than normal (is bloated). °· You have very bad belly pain. °· You feel dizzy or you faint. °This information is not intended to replace advice given to you by your health care provider. Make sure you discuss any questions you have with your health care provider. °Document Released:  08/16/2007 Document Revised: 09/17/2015 Document Reviewed: 08/18/2015 °Elsevier Interactive Patient Education © 2017 Elsevier Inc. ° °

## 2016-11-20 NOTE — MAU Note (Signed)
Pt presents with constant lower abdominal pain that radiates into the left side that began Saturday.  Denies vaginal bleeding.

## 2016-11-20 NOTE — MAU Note (Signed)
Pt started having abdominal pain last night, had several BM's, no diarrhea.  Abd pain continues this morning, cramping in lower abd.  Denies vaginal bleeding but has discharge.  Denies fever or vomiting.  Has abd pain with urination.

## 2016-11-20 NOTE — MAU Provider Note (Signed)
History     CSN: [id number]  Arrival date and time: 11/20/16 1047   First Provider Initiated Contact with Patient 11/20/16 1419      Chief Complaint  Patient presents with  . Abdominal Pain  . Vaginal Discharge  . Dysuria   HPI Mary [name] is 51 y.o. G5P0  presenting with lower left abdominal pain.  The pain began 2 days ago.  States it felt like gas, took Maalox which did not help.  Has had "funny" BMs lately, "felt like I wasn't passing anything but I was".  Pain continued after BM.   Rates pain as 8/10 that radiates to her left side. Uncomfortable to sit, unable to work her shift.  She states she no longer has menstrual periods but occasional spotting about q 6 months.  Last spotting was last month.  Hx of kidney stone with cystoscopy with stent in May. This pain is different.  Sexually active, 1 partner.  Denies fever, chills, nausea, or vomiting.  + for white discharge without odor.     Past Medical History:  Diagnosis Date  . Bipolar depression (HCC)   . Borderline personality disorder   . COPD (chronic obstructive pulmonary disease) (HCC)   . GERD (gastroesophageal reflux disease)   . Hiatal hernia   . History of alcohol abuse    per pt none since 10/ 2014  . History of attempted suicide   . History of drug abuse in remission    per pt in remission since 12-26-2012  polysubstance dependence (alcohol, crack, cocaine, 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  . History of hepatitis C per pt first dx 2009 (approx)   consulted w/ dr comer (infectious disease) note 04-26-2016 , pt states was called and told per lab work no longer has hepatitis c   . Hypercalcemia    mild and long-standing--- pt asymptomatic  . Primary hyperparathyroidism [facility])    endocrinology --  dr Lucianne Muss  . Pruritic erythematous rash    lower legs  . PTSD (post-traumatic stress disorder)   . Right ureteral stone   . Schizophrenia (HCC)   . Vitamin D deficiency     Past Surgical History:   Procedure Laterality Date  . CYSTOSCOPY W/ URETERAL STENT PLACEMENT Right 07/14/2016   Procedure: CYSTOSCOPY WITH STENT REPLACEMENT;  Surgeon: Malen Gauze, MD;  Location: [facility];  Service: Urology;  Laterality: Right;  . CYSTOSCOPY WITH RETROGRADE PYELOGRAM, URETEROSCOPY AND STENT PLACEMENT Right 06/28/2016   Procedure: CYSTOSCOPY WITH RETROGRADE PYELOGRAM, URETEROSCOPY AND STENT PLACEMENT;  Surgeon: Malen Gauze, MD;  Location: [facility];  Service: Urology;  Laterality: Right;  . CYSTOSCOPY/RETROGRADE/URETEROSCOPY/STONE EXTRACTION WITH BASKET Right 07/14/2016   Procedure: CYSTOSCOPY/RETROGRADE/URETEROSCOPY/STONE EXTRACTION WITH BASKET;  Surgeon: Malen Gauze, MD;  Location: [facility];  Service: Urology;  Laterality: Right;  . DIAGNOSTIC LAPAROSCOPIC LIVER BIOPSY N/A 02/10/2013   Procedure: DIAGNOSTIC LAPAROSCOPIC ;  Surgeon: Ardeth Sportsman, MD;  Location: [facility];  Service: General;  Laterality: N/A;  DIAGNOSTIC LAPAROSCOPY,laparoscopic ventral hernia repair with mesh lysis of adhesions  . HOLMIUM LASER APPLICATION Right 07/14/2016   Procedure: HOLMIUM LASER APPLICATION;  Surgeon: Malen Gauze, MD;  Location: [facility];  Service: Urology;  Laterality: Right;  . SPLENECTOMY  2004  . TUBAL LIGATION      Family History  Problem Relation Age of Onset  . Alcohol abuse Mother   . Alcohol abuse Father   . Cancer Maternal Grandmother  colon  . Diabetes Maternal Grandmother   . Hypertension Maternal Grandmother   . Alcohol abuse Sister   . Thyroid disease Neg Hx     Social History  Substance Use Topics  . Smoking status: Current Every Day Smoker    Packs/day: 0.50    Years: 32.00    Types: Cigarettes    Last attempt to quit: 04/13/2016  . Smokeless tobacco: Never Used  . Alcohol use No     Comment: quit 12/2012    Allergies: No Known Allergies  Prescriptions Prior to Admission  Medication Sig Dispense  Refill Last Dose  . clotrimazole-betamethasone (LOTRISONE) cream Apply 1 application topically 2 (two) times daily. 45 g 1 Past Week at Unknown time  . Multiple Vitamin (MULTIVITAMIN WITH MINERALS) TABS tablet Take 1 tablet by mouth daily.   11/20/2016 at Unknown time  . naproxen sodium (ALEVE) 220 MG tablet Take 220-440 mg by mouth 2 (two) times daily as needed (for headaches).   Past Week at Unknown time  . pantoprazole (PROTONIX) 40 MG tablet Take 1 tablet (40 mg total) by mouth daily. (Patient taking differently: Take 40 mg by mouth every morning. ) 30 tablet 3 11/20/2016 at Unknown time  . QUEtiapine (SEROQUEL) 300 MG tablet Take 300 mg by mouth at bedtime.   11/19/2016 at Unknown time  . ranitidine (ZANTAC) 300 MG tablet Take 1 tablet (300 mg total) by mouth at bedtime. 30 tablet 1 11/19/2016 at Unknown time  . sertraline (ZOLOFT) 25 MG tablet Take 25 mg by mouth every morning.    11/20/2016 at Unknown time  . sucralfate (CARAFATE) 1 g tablet Take 1 tablet (1 g total) by mouth 4 (four) times daily -  with meals and at bedtime. 40 tablet 0 11/19/2016 at Unknown time  . albuterol (PROVENTIL HFA;VENTOLIN HFA) 108 (90 Base) MCG/ACT inhaler Inhale 2 puffs into the lungs every 6 (six) hours as needed for wheezing or shortness of breath. 1 Inhaler 1 prn  . baclofen (LIORESAL) 10 MG tablet Take 1 tablet (10 mg total) by mouth 3 (three) times daily. (Patient not taking: Reported on 11/20/2016) 45 each 1 Not Taking at Unknown time  . cyclobenzaprine (FLEXERIL) 10 MG tablet Take 1.5 tablets (15 mg total) by mouth 3 (three) times daily as needed for muscle spasms. (Patient not taking: Reported on 11/20/2016) 15 tablet 0 Not Taking at Unknown time  . fluticasone-salmeterol (ADVAIR HFA) 230-21 MCG/ACT inhaler Inhale 2 puffs into the lungs 2 (two) times daily. Rinse mouth after each use (Patient not taking: Reported on 11/20/2016) 1 Inhaler 2 Not Taking at Unknown time  . metroNIDAZOLE (FLAGYL) 500 MG tablet Take 1 tablet  (500 mg total) by mouth 2 (two) times daily. No alcohol while taking this medication (Patient not taking: Reported on 11/20/2016) 14 tablet 0 Completed Course at Unknown time  . naproxen (NAPROSYN) 375 MG tablet Take 1 tablet (375 mg total) by mouth 2 (two) times daily. (Patient not taking: Reported on 11/20/2016) 20 tablet 0 Not Taking at Unknown time  . ondansetron (ZOFRAN ODT) 4 MG disintegrating tablet Take 1 tablet (4 mg total) by mouth every 8 (eight) hours as needed for nausea or vomiting. (Patient not taking: Reported on 11/20/2016) 30 tablet 0 Not Taking at Unknown time  . oxyCODONE-acetaminophen (ROXICET) 5-325 MG tablet Take 1 tablet by mouth every 4 (four) hours as needed for severe pain. (Patient not taking: Reported on 11/20/2016) 30 tablet 0 Not Taking at Unknown time    Review  of Systems  Constitutional: Negative for chills and fever.  Respiratory: Negative for chest tightness.   Cardiovascular: Negative for chest pain.  Gastrointestinal: Positive for abdominal pain (lower abdominal pain "across the whole bottom:). Negative for constipation (BMs different, feels she isn't passing anything but she is.), diarrhea, nausea, rectal pain and vomiting.  Genitourinary: Positive for pelvic pain and vaginal discharge. Negative for dysuria, flank pain, frequency, menstrual problem, vaginal bleeding and vaginal pain.  Musculoskeletal: Positive for back pain (left lower pain radiates to lower back).   Physical Exam   Blood pressure 129/66, pulse 74, temperature 97.7 F (36.5 C), temperature source Oral, resp. rate 18, height 5\' 4"  (1.626 m), weight 180 lb (81.6 kg).  Physical Exam  Nursing note reviewed. Constitutional: She is oriented to person, place, and time. She appears well-developed and well-nourished. No distress.  HENT:  Head: Normocephalic.  Neck: Normal range of motion.  Cardiovascular: Normal rate.   Respiratory: Effort normal.  GI: Soft. She exhibits no distension and no mass.  There is tenderness (discomfort on exam over the lower abdomen,). There is no rebound and no guarding.  Genitourinary: There is no rash, tenderness or lesion on the right labia. There is no rash, tenderness or lesion on the left labia. Uterus is not enlarged and not tender. Cervix exhibits no motion tenderness, no discharge and no friability. Right adnexum displays tenderness (mild). Right adnexum displays no mass and no fullness. Left adnexum displays no mass and no fullness. Tenderness: mild. No erythema or bleeding in the vagina. Vaginal discharge (positive for malodorous thin frothy dicharge. ) found.  Neurological: She is alert and oriented to person, place, and time.  Skin: Skin is warm and dry.  Psychiatric: She has a normal mood and affect. Her behavior is normal.   Results for orders placed or performed during the hospital encounter of 11/20/16 (from the past 24 hour(s))  Urinalysis, Routine w reflex microscopic     Status: Abnormal   Collection Time: 11/20/16 11:21 AM  Result Value Ref Range   Color, Urine YELLOW YELLOW   APPearance HAZY (A) CLEAR   Specific Gravity, Urine 1.020 1.005 - 1.030   pH 5.0 5.0 - 8.0   Glucose, UA NEGATIVE NEGATIVE mg/dL   Hgb urine dipstick NEGATIVE NEGATIVE   Bilirubin Urine NEGATIVE NEGATIVE   Ketones, ur NEGATIVE NEGATIVE mg/dL   Protein, ur NEGATIVE NEGATIVE mg/dL   Nitrite NEGATIVE NEGATIVE   Leukocytes, UA TRACE (A) NEGATIVE   RBC / HPF 0-5 0 - 5 RBC/hpf   WBC, UA 0-5 0 - 5 WBC/hpf   Bacteria, UA RARE (A) NONE SEEN   Squamous Epithelial / LPF 6-30 (A) NONE SEEN   Mucus PRESENT   Pregnancy, urine POC     Status: None   Collection Time: 11/20/16 12:33 PM  Result Value Ref Range   Preg Test, Ur NEGATIVE NEGATIVE  Wet prep, genital     Status: Abnormal   Collection Time: 11/20/16  2:34 PM  Result Value Ref Range   Yeast Wet Prep HPF POC NONE SEEN NONE SEEN   Trich, Wet Prep NONE SEEN NONE SEEN   Clue Cells Wet Prep HPF POC NONE SEEN NONE  SEEN   WBC, Wet Prep HPF POC MODERATE (A) NONE SEEN   Sperm NONE SEEN   CBC with Differential/Platelet     Status: Abnormal   Collection Time: 11/20/16  3:04 PM  Result Value Ref Range   WBC 13.9 (H) 4.0 - 10.5 K/uL  RBC 4.61 3.87 - 5.11 MIL/uL   Hemoglobin 14.4 12.0 - 15.0 g/dL   HCT 16.1 09.6 - 04.5 %   MCV 90.9 78.0 - 100.0 fL   MCH 31.2 26.0 - 34.0 pg   MCHC 34.4 30.0 - 36.0 g/dL   RDW 40.9 81.1 - 91.4 %   Platelets 358 150 - 400 K/uL   Neutrophils Relative % 53 %   Neutro Abs 7.4 1.7 - 7.7 K/uL   Lymphocytes Relative 40 %   Lymphs Abs 5.6 (H) 0.7 - 4.0 K/uL   Monocytes Relative 4 %   Monocytes Absolute 0.5 0.1 - 1.0 K/uL   Eosinophils Relative 3 %   Eosinophils Absolute 0.4 0.0 - 0.7 K/uL   Basophils Relative 0 %   Basophils Absolute 0.1 0.0 - 0.1 K/uL  Rapid HIV screen (HIV 1/2 Ab+Ag) ([facility] Only)     Status: None   Collection Time: 11/20/16  3:04 PM  Result Value Ref Range   HIV-1 P24 Antigen - HIV24 NON REACTIVE NON REACTIVE   HIV 1/2 Antibodies NON REACTIVE NON REACTIVE   Interpretation (HIV Ag Ab)      A non reactive test result means that HIV 1 or HIV 2 antibodies and HIV 1 p24 antigen were not detected in the specimen.   US Pelvis Transvanginal Non-ob (tv Only)  Result Date: 11/20/2016 CLINICAL DATA:  Lower abdominal pain EXAM: TRANSABDOMINAL AND TRANSVAGINAL ULTRASOUND OF PELVIS TECHNIQUE: Both transabdominal and transvaginal ultrasound examinations of the pelvis were performed. Transabdominal technique was performed for global imaging of the pelvis including uterus, ovaries, adnexal regions, and pelvic cul-de-sac. It was necessary to proceed with endovaginal exam following the transabdominal exam to visualize the uterus, endometrium and ovaries. COMPARISON:  None FINDINGS: Uterus Measurements: 9 x 4.7 x 5.8 cm. No fibroids or other mass visualized. Endometrium Thickness: 10.5 mm.  No focal abnormality visualized. Right ovary Measurements: 2.3 x 1.2 x 1.5 cm. Normal  appearance/no adnexal mass. Left ovary Measurements: 2.9 x 1.6 x 2.1 cm. Normal appearance/no adnexal mass. Other findings No abnormal free fluid. IMPRESSION: 1. The endometrium measures 10.5 mm in thickness. No focal abnormality. If bleeding remains unresponsive to hormonal or medical therapy, sonohysterogram should be considered for focal lesion work-up. (Ref: Radiological Reasoning: Algorithmic Workup of Abnormal Vaginal Bleeding with Endovaginal Sonography and Sonohysterography. AJR 2008; 782:N56-21) Electronically Signed   By: Signa Kell M.D.   On: 11/20/2016 16:42   US Pelvis (transabdominal Only)  Result Date: 11/20/2016 CLINICAL DATA:  Lower abdominal pain EXAM: TRANSABDOMINAL AND TRANSVAGINAL ULTRASOUND OF PELVIS TECHNIQUE: Both transabdominal and transvaginal ultrasound examinations of the pelvis were performed. Transabdominal technique was performed for global imaging of the pelvis including uterus, ovaries, adnexal regions, and pelvic cul-de-sac. It was necessary to proceed with endovaginal exam following the transabdominal exam to visualize the uterus, endometrium and ovaries. COMPARISON:  None FINDINGS: Uterus Measurements: 9 x 4.7 x 5.8 cm. No fibroids or other mass visualized. Endometrium Thickness: 10.5 mm.  No focal abnormality visualized. Right ovary Measurements: 2.3 x 1.2 x 1.5 cm. Normal appearance/no adnexal mass. Left ovary Measurements: 2.9 x 1.6 x 2.1 cm. Normal appearance/no adnexal mass. Other findings No abnormal free fluid. IMPRESSION: 1. The endometrium measures 10.5 mm in thickness. No focal abnormality. If bleeding remains unresponsive to hormonal or medical therapy, sonohysterogram should be considered for focal lesion work-up. (Ref: Radiological Reasoning: Algorithmic Workup of Abnormal Vaginal Bleeding with Endovaginal Sonography and Sonohysterography. AJR 2008; 308:M57-84) Electronically Signed   By: Ladona Ridgel  Bradly Chris M.D.   On: 11/20/2016 16:42   MAU Course  Procedures    GC/CHL pending  MDM MSE Exam Labs Ultrasound Asking for pain med and something to eat.  Crackers given.  Toradol  IM given in MAU  Assessment and Plan  A:  Lower abdominal pain.       Endometrial thickness       Perimenopausal  P:  Encouraged patient to call the GYN clinic, number given,  to schedule appointment.  She agreed to call for appt.     Discussed U/S findings--also instructed to keep bowels regular with using a stool  softner and stay well hydrated.     Discussed possibility that if stool softner doesn't help, she should consider GI consult.            Dennison Mascot Key 11/20/2016, 4:23 PM

## 2016-11-21 ENCOUNTER — Telehealth: Payer: Self-pay | Admitting: *Deleted

## 2016-11-21 LAB — GC/CHLAMYDIA PROBE AMP (~~LOC~~) NOT AT ARMC
CHLAMYDIA, DNA PROBE: NEGATIVE
NEISSERIA GONORRHEA: NEGATIVE

## 2016-11-21 NOTE — Telephone Encounter (Signed)
Pt called to office stating that [facility]d/pelvic pain.  Pt states she was told to schedule appt in clinic for follow up. Pt states that she is still in severe pain and has no relief with use of Motrin.  Pt made aware that until she can be seen in an office she will have to f/u at hospital. Cannot treat pain without being seen. Pt made aware that our office does not have any sooner appts with provider she needs to see. Pt was advised that she may call other offices and she may be able to get sooner appt.  Pt states understanding.

## 2016-11-26 ENCOUNTER — Other Ambulatory Visit: Payer: Self-pay | Admitting: Endocrinology

## 2016-11-26 DIAGNOSIS — E213 Hyperparathyroidism, unspecified: Secondary | ICD-10-CM

## 2016-11-26 DIAGNOSIS — E559 Vitamin D deficiency, unspecified: Secondary | ICD-10-CM

## 2016-11-28 ENCOUNTER — Other Ambulatory Visit: Payer: Self-pay

## 2016-12-01 ENCOUNTER — Ambulatory Visit: Payer: Self-pay | Admitting: Endocrinology

## 2016-12-01 DIAGNOSIS — Z0289 Encounter for other administrative examinations: Secondary | ICD-10-CM

## 2016-12-05 ENCOUNTER — Ambulatory Visit (INDEPENDENT_AMBULATORY_CARE_PROVIDER_SITE_OTHER): Payer: BLUE CROSS/BLUE SHIELD | Admitting: Obstetrics and Gynecology

## 2016-12-05 ENCOUNTER — Encounter: Payer: Self-pay | Admitting: Obstetrics and Gynecology

## 2016-12-05 VITALS — BP 118/78 | HR 75 | Ht 64.0 in | Wt 181.0 lb

## 2016-12-05 DIAGNOSIS — Z3202 Encounter for pregnancy test, result negative: Secondary | ICD-10-CM | POA: Diagnosis not present

## 2016-12-05 DIAGNOSIS — R9389 Abnormal findings on diagnostic imaging of other specified body structures: Secondary | ICD-10-CM

## 2016-12-05 DIAGNOSIS — R938 Abnormal findings on diagnostic imaging of other specified body structures: Secondary | ICD-10-CM

## 2016-12-05 DIAGNOSIS — Z01818 Encounter for other preprocedural examination: Secondary | ICD-10-CM

## 2016-12-05 DIAGNOSIS — N8501 Benign endometrial hyperplasia: Secondary | ICD-10-CM | POA: Diagnosis not present

## 2016-12-05 LAB — POCT URINE PREGNANCY: Preg Test, Ur: NEGATIVE

## 2016-12-05 NOTE — Patient Instructions (Signed)
ENDOMETRIAL BIOPSY POST-PROCEDURE INSTRUCTIONS  1. You may take Ibuprofen, Aleve or Tylenol for pain if needed.  Cramping should resolve within in 24 hours.  2. You may have a small amount of spotting.  You should wear a mini pad for the next few days.  3. You may have intercourse after 24 hours.  4. You need to call if you have any pelvic pain, fever, heavy bleeding or foul smelling vaginal discharge.  5. Shower or bathe as normal  6. We will call you within one week with results or we will discuss   the results at your follow-up appointment if needed.  Endometrial Biopsy Endometrial biopsy is a procedure in which a tissue sample is taken from inside the uterus. The sample is taken from the endometrium, which is the lining of the uterus. The tissue sample is then checked under a microscope to see if the tissue is normal or abnormal. This procedure helps to determine where you are in your menstrual cycle and how hormone levels are affecting the lining of the uterus. This procedure may also be used to evaluate uterine bleeding or to diagnose endometrial cancer, endometrial tuberculosis, polyps, or other inflammatory conditions. Tell a health care provider about:  Any allergies you have.  All medicines you are taking, including vitamins, herbs, eye drops, creams, and over-the-counter medicines.  Any problems you or family members have had with anesthetic medicines.  Any blood disorders you have.  Any surgeries you have had.  Any medical conditions you have.  Whether you are pregnant or may be pregnant. What are the risks? Generally, this is a safe procedure. However, problems may occur, including:  Bleeding.  Pelvic infection.  Puncture of the wall of the uterus with the biopsy device (rare).  What happens before the procedure?  Keep a record of your menstrual cycles as told by your health care provider. You may need to schedule your procedure for a specific time in your  cycle.  You may want to bring a sanitary pad to wear after the procedure.  Ask your health care provider about: ? Changing or stopping your regular medicines. This is especially important if you are taking diabetes medicines or blood thinners. ? Taking medicines such as aspirin and ibuprofen. These medicines can thin your blood. Do not take these medicines before your procedure if your health care provider instructs you not to.  Plan to have someone take you home from the hospital or clinic. What happens during the procedure?  To lower your risk of infection: ? Your health care team will wash or sanitize their hands.  You will lie on an exam table with your feet and legs supported as in a pelvic exam.  Your health care provider will insert an instrument (speculum) into your vagina to see your cervix.  Your cervix will be cleansed with an antiseptic solution.  A medicine (local anesthetic) will be used to numb the cervix.  A forceps instrument (tenaculum) will be used to hold your cervix steady for the biopsy.  A thin, rod-like instrument (uterine sound) will be inserted through your cervix to determine the length of your uterus and the location where the biopsy sample will be removed.  A thin, flexible tube (catheter) will be inserted through your cervix and into the uterus. The catheter will be used to collect the biopsy sample from your endometrial tissue.  The catheter and speculum will then be removed, and the tissue sample will be sent to a lab for  examination. What happens after the procedure?  You will rest in a recovery area until you are ready to go home.  You may have mild cramping and a small amount of vaginal bleeding. This is normal.  It is up to you to get the results of your procedure. Ask your health care provider, or the department that is doing the procedure, when your results will be ready. Summary  Endometrial biopsy is a procedure in which a tissue sample is  taken from the endometrium, which is the lining of the uterus.  This procedure may help to diagnose menstrual cycle problems, abnormal bleeding, or other conditions affecting the endometrium.  Before the procedure, keep a record of your menstrual cycles as told by your health care provider.  The tissue sample that is removed will be checked under a microscope to see if it is normal or abnormal. This information is not intended to replace advice given to you by your health care provider. Make sure you discuss any questions you have with your health care provider. Document Released: 06/30/2004 Document Revised: 03/15/2016 Document Reviewed: 03/15/2016 Elsevier Interactive Patient Education  2017 [employer].

## 2016-12-05 NOTE — Progress Notes (Signed)
Pt presents to f/u from the hospital.   Ms Yust is a AA female G5P5 with last normal cycle in March or April of this yr. Cycle was normal flow of @ 5 days. She had no cycle until to the middle of August. This cycle was only a few days of spotting.  She was seen in the [facility] a few weeks ago for abd pain. U/S revealed thicken endometrium at 10.5 mm  Her abd pain has now resolved  She is a recovery drug addict, and has been drug free for over 4 yrs now.  Last pap this year, ASCUS HPV negative. Mammogram negative this year  TSVD x 5 BTL  PE AF VSS Lungs clear  Heart RRR Abd soft + bs GU Nl EGBUS cervix no lesion  EMBX:  After procedure reviewed with pt, informed consent was obtain. UPT negative. Pt placed in DL position. Speculum placed cervix exposed, cleaned with Betadine Cervix grasped with tenaculum. Pipelle passed without problems. Specimen to pathology Instruments removed. Pt tolerated well. Post procedure instructions provided to pt   A/P Thicken Endometrium  S/P EMBX today. Pt will be contacted with results and additional treatment as indicate by results.

## 2016-12-08 ENCOUNTER — Telehealth: Payer: Self-pay

## 2016-12-08 NOTE — Telephone Encounter (Signed)
-----   Message from Hermina Staggers, MD sent at 12/08/2016  8:26 AM EDT ----- Please have pt make appt with me to discuss EMBX results. Thanks Casimiro Needle

## 2016-12-08 NOTE — Telephone Encounter (Signed)
Patient notified to make appt to discuss EMBX results. Transferred to front office for scheduling.

## 2016-12-11 ENCOUNTER — Ambulatory Visit (INDEPENDENT_AMBULATORY_CARE_PROVIDER_SITE_OTHER): Payer: BLUE CROSS/BLUE SHIELD | Admitting: Obstetrics and Gynecology

## 2016-12-11 DIAGNOSIS — N8500 Endometrial hyperplasia, unspecified: Secondary | ICD-10-CM

## 2016-12-11 NOTE — Patient Instructions (Signed)
Vaginal Hysterectomy A vaginal hysterectomy is a procedure to remove all or part of the uterus through a small incision in the vagina. In this procedure, your health care provider may remove your entire uterus, including the lower end (cervix). You may need a vaginal hysterectomy to treat:  Uterine fibroids.  A condition that causes the lining of the uterus to grow in other areas (endometriosis).  Problems with pelvic support.  Cancer of the cervix, ovaries, uterus, or tissue that lines the uterus (endometrium).  Excessive (dysfunctional) uterine bleeding.  When removing your uterus, your health care provider may also remove the organs that produce eggs (ovaries) and the tubes that carry eggs to your uterus (fallopian tubes). After a vaginal hysterectomy, you will no longer be able to have a baby. You will also no longer get your menstrual period. Tell a health care provider about:  Any allergies you have.  All medicines you are taking, including vitamins, herbs, eye drops, creams, and over-the-counter medicines.  Any problems you or family members have had with anesthetic medicines.  Any blood disorders you have.  Any surgeries you have had.  Any medical conditions you have.  Whether you are pregnant or may be pregnant. What are the risks? Generally, this is a safe procedure. However, problems may occur, including:  Bleeding.  Infection.  A blood clot that forms in your leg and travels to your lungs (pulmonary embolism).  Damage to surrounding organs.  Pain during sex.  What happens before the procedure?  Ask your health care provider what organs will be removed during surgery.  Ask your health care provider about: ? Changing or stopping your regular medicines. This is especially important if you are taking diabetes medicines or blood thinners. ? Taking medicines such as aspirin and ibuprofen. These medicines can thin your blood. Do not take these medicines before  your procedure if your health care provider instructs you not to.  Follow instructions from your health care provider about eating or drinking restrictions.  Do not use any tobacco products, such as cigarettes, chewing tobacco, and e-cigarettes. If you need help quitting, ask your health care provider.  Plan to have someone take you home after discharge from the hospital. What happens during the procedure?  To reduce your risk of infection: ? Your health care team will wash or sanitize their hands. ? Your skin will be washed with soap.  An IV tube will be inserted into one of your veins.  You may be given antibiotic medicine to help prevent infection.  You will be given one or more of the following: ? A medicine to help you relax (sedative). ? A medicine to numb the area (local anesthetic). ? A medicine to make you fall asleep (general anesthetic). ? A medicine that is injected into an area of your body to numb everything beyond the injection site (regional anesthetic).  Your surgeon will make an incision in your vagina.  Your surgeon will locate and remove all or part of your uterus.  Your ovaries and fallopian tubes may be removed at the same time.  The incision will be closed with stitches (sutures) that dissolve over time. The procedure may vary among health care providers and hospitals. What happens after the procedure?  Your blood pressure, heart rate, breathing rate, and blood oxygen level will be monitored often until the medicines you were given have worn off.  You will be encouraged to get up and walk around after a few hours to help prevent   complications.  You may have IV tubes in place for a few days.  You will be given pain medicine as needed.  Do not drive for 24 hours if you were given a sedative. This information is not intended to replace advice given to you by your health care provider. Make sure you discuss any questions you have with your health care  provider. Document Released: 06/21/2015 Document Revised: 08/05/2015 Document Reviewed: 03/14/2015 Elsevier Interactive Patient Education  2018 [employer]  

## 2016-12-11 NOTE — Progress Notes (Signed)
Pt here to discuss recent [facility] results. Pt had EMBX d/t thicken endometrium noted on U/S.  EMBX returned with simple hyperplasia without atypia Results reviewed with pt  Tx options reviewed with pt  Following discussion pt desires to proceed with TVH/BS  A/P Simple hyperplasia without atypia of the endometrium  R/B/Post op care of TVH/BS reviewed with pt Will schedule pt. F/U with post op appt.

## 2016-12-13 ENCOUNTER — Telehealth: Payer: Self-pay

## 2016-12-13 ENCOUNTER — Encounter: Payer: Self-pay | Admitting: *Deleted

## 2016-12-13 ENCOUNTER — Encounter (HOSPITAL_COMMUNITY): Payer: Self-pay

## 2016-12-13 NOTE — Telephone Encounter (Signed)
Returned call and patient had questions about the scheduling for her upcoming surgery

## 2016-12-20 NOTE — Patient Instructions (Addendum)
  Your procedure is scheduled on:  Wednesday, Oct. 31  Enter through the [employer] of [facility] at: 11:00 am  Pick up the phone at the desk and dial [phone].  Call this number if you have problems the morning of surgery: [phone].  Remember: Do NOT eat food or drink clear liquids (including water) after midnight Tuesday.  Take these medicines the morning of surgery with a SIP OF WATER: zoloft, zantac.  Ok to use advair inhaler.  Bring albuterol inhaler with you on day of surgery.  Do Not smoke on the day of surgery.  Stop herbal medications and supplements at this time.  Do NOT wear jewelry (body piercing), metal hair clips/bobby pins, make-up, or nail polish. Do NOT wear lotions, powders, or perfumes.  You may wear deoderant. Do NOT shave for 48 hours prior to surgery. Do NOT bring valuables to the hospital. Contacts, dentures, or bridgework may not be worn into surgery.  Leave suitcase in car.  After surgery it may be brought to your room.  For patients admitted to the hospital, checkout time is 11:00 AM the day of discharge. Have a responsible adult drive you home and stay with you for 24 hours after your procedure.

## 2016-12-28 ENCOUNTER — Inpatient Hospital Stay (HOSPITAL_COMMUNITY)
Admission: RE | Admit: 2016-12-28 | Discharge: 2016-12-28 | Disposition: A | Discharge status: Home / Self Care | Payer: Self-pay | Source: Ambulatory Visit

## 2017-01-02 ENCOUNTER — Encounter (HOSPITAL_COMMUNITY): Payer: Self-pay

## 2017-01-02 ENCOUNTER — Other Ambulatory Visit: Payer: Self-pay

## 2017-01-02 ENCOUNTER — Encounter (HOSPITAL_COMMUNITY)
Admission: RE | Admit: 2017-01-02 | Discharge: 2017-01-02 | Disposition: A | Discharge status: Home / Self Care | Payer: BLUE CROSS/BLUE SHIELD | Source: Ambulatory Visit | Attending: Obstetrics and Gynecology | Admitting: Obstetrics and Gynecology

## 2017-01-02 DIAGNOSIS — Z01810 Encounter for preprocedural cardiovascular examination: Secondary | ICD-10-CM | POA: Diagnosis not present

## 2017-01-02 DIAGNOSIS — Z01812 Encounter for preprocedural laboratory examination: Secondary | ICD-10-CM | POA: Diagnosis not present

## 2017-01-02 HISTORY — DX: Personal history of urinary calculi: Z87.442

## 2017-01-02 LAB — BASIC METABOLIC PANEL
ANION GAP: 4 — AB (ref 5–15)
BUN: 9 mg/dL (ref 6–20)
CHLORIDE: 99 mmol/L — AB (ref 101–111)
CO2: 28 mmol/L (ref 22–32)
Calcium: 9.9 mg/dL (ref 8.9–10.3)
Creatinine, Ser: 0.75 mg/dL (ref 0.44–1.00)
Glucose, Bld: 89 mg/dL (ref 65–99)
POTASSIUM: 3.8 mmol/L (ref 3.5–5.1)
SODIUM: 131 mmol/L — AB (ref 135–145)

## 2017-01-02 LAB — CBC
HEMATOCRIT: 40.6 % (ref 36.0–46.0)
HEMOGLOBIN: 13.8 g/dL (ref 12.0–15.0)
MCH: 31.1 pg (ref 26.0–34.0)
MCHC: 34 g/dL (ref 30.0–36.0)
MCV: 91.4 fL (ref 78.0–100.0)
Platelets: 320 10*3/uL (ref 150–400)
RBC: 4.44 MIL/uL (ref 3.87–5.11)
RDW: 13.5 % (ref 11.5–15.5)
WBC: 9.9 10*3/uL (ref 4.0–10.5)

## 2017-01-02 NOTE — Patient Instructions (Addendum)
Your procedure is scheduled on:  Wednesday, Oct. 31, 2018  [facility] at:  10:45 AM  Pick up the phone at the desk and dial [phone].  Call this number if you have problems the morning of surgery: [phone].  Remember: Do NOT eat food:  After Midnight Tuesday  Do NOT drink clear liquids after:  6:00 AM Wednesday  Take these medicines the morning of surgery with a SIP OF WATER:  Sertraline  Use Asthma Inhaler per normal routine  Bring rescue asthma inhaler day of surgery  Stop ALL herbal medications at this time  Do NOT smoke the day of surgery.  Do NOT wear jewelry (body piercing), metal hair clips/bobby pins, make-up, artifical eyelashes or nail polish. Do NOT wear lotions, powders, or perfumes.  You may wear deodorant. Do NOT shave for 48 hours prior to surgery. Do NOT bring valuables to the hospital. Contacts, dentures, or bridgework may not be worn into surgery.  Leave suitcase in car.  After surgery it may be brought to your room.  For patients admitted to the hospital, checkout time is 11:00 AM the day of discharge.  Bring a copy of your healthcare power of attorney and living will documents.    Incentive Spirometer  An incentive spirometer is a tool that can help keep your lungs clear and active. This tool measures how well you are filling your lungs with each breath. Taking long deep breaths may help reverse or decrease the chance of developing breathing (pulmonary) problems (especially infection) following:  A long period of time when you are unable to move or be active. BEFORE THE PROCEDURE   If the spirometer includes an indicator to show your best effort, your nurse or respiratory therapist will set it to a desired goal.  If possible, sit up straight or lean slightly forward. Try not to slouch.  Hold the incentive spirometer in an upright position. INSTRUCTIONS FOR USE  1. Sit on the edge of your bed if possible, or  sit up as far as you can in bed or on a chair. 2. Hold the incentive spirometer in an upright position. 3. Breathe out normally. 4. Place the mouthpiece in your mouth and seal your lips tightly around it. 5. Breathe in slowly and as deeply as possible, raising the piston or the ball toward the top of the column. 6. Hold your breath for 3-5 seconds or for as long as possible. Allow the piston or ball to fall to the bottom of the column. 7. Remove the mouthpiece from your mouth and breathe out normally. 8. Rest for a few seconds and repeat Steps 1 through 7 at least 10 times every 1-2 hours when you are awake. Take your time and take a few normal breaths between deep breaths. 9. The spirometer may include an indicator to show your best effort. Use the indicator as a goal to work toward during each repetition. 10. After each set of 10 deep breaths, practice coughing to be sure your lungs are clear. If you have an incision (the cut made at the time of surgery), support your incision when coughing by placing a pillow or rolled up towels firmly against it. Once you are able to get out of bed, walk around indoors and cough well. You may stop using the incentive spirometer when instructed by your caregiver.  RISKS AND COMPLICATIONS  Take your time so you do not get dizzy or light-headed.  If you are in  pain, you may need to take or ask for pain medication before doing incentive spirometry. It is harder to take a deep breath if you are having pain. AFTER USE  Rest and breathe slowly and easily.  It can be helpful to keep track of a log of your progress. Your caregiver can provide you with a simple table to help with this. If you are using the spirometer at home, follow these instructions: SEEK MEDICAL CARE IF:   You are having difficultly using the spirometer.  You have trouble using the spirometer as often as instructed.  Your pain medication is not giving enough relief while using the  spirometer.  You develop fever of 100.5 F (38.1 C) or higher. SEEK IMMEDIATE MEDICAL CARE IF:   You cough up bloody sputum that had not been present before.  You develop fever of 102 F (38.9 C) or greater.  You develop worsening pain at or near the incision site. MAKE SURE YOU:   Understand these instructions.  Will watch your condition.  Will get help right away if you are not doing well or get worse. Document Released: 07/10/2006 Document Revised: 05/22/2011 Document Reviewed: 09/10/2006 [facility] Patient Information 2014 [place], [place].   ________________________________________________________________________

## 2017-01-02 NOTE — Pre-Procedure Instructions (Signed)
BMP results 01/02/17 sent to Dr. Waynard EdwardsErvin's in basket in epic.

## 2017-01-10 ENCOUNTER — Ambulatory Visit (HOSPITAL_COMMUNITY): Payer: BLUE CROSS/BLUE SHIELD | Admitting: Anesthesiology

## 2017-01-10 ENCOUNTER — Encounter (HOSPITAL_COMMUNITY): Payer: Self-pay | Admitting: Emergency Medicine

## 2017-01-10 ENCOUNTER — Encounter (HOSPITAL_COMMUNITY)
Admission: AD | Disposition: A | Discharge status: Home / Self Care | Payer: Self-pay | Source: Ambulatory Visit | Attending: Obstetrics and Gynecology

## 2017-01-10 ENCOUNTER — Inpatient Hospital Stay (HOSPITAL_COMMUNITY)
Admission: AD | Admit: 2017-01-10 | Discharge: 2017-01-13 | DRG: 742 | Disposition: A | Discharge status: Home / Self Care | Payer: BLUE CROSS/BLUE SHIELD | Source: Ambulatory Visit | Attending: Obstetrics and Gynecology | Admitting: Obstetrics and Gynecology

## 2017-01-10 DIAGNOSIS — K449 Diaphragmatic hernia without obstruction or gangrene: Secondary | ICD-10-CM | POA: Diagnosis not present

## 2017-01-10 DIAGNOSIS — J9811 Atelectasis: Secondary | ICD-10-CM | POA: Diagnosis not present

## 2017-01-10 DIAGNOSIS — B1920 Unspecified viral hepatitis C without hepatic coma: Secondary | ICD-10-CM | POA: Diagnosis not present

## 2017-01-10 DIAGNOSIS — F3130 Bipolar disorder, current episode depressed, mild or moderate severity, unspecified: Secondary | ICD-10-CM | POA: Diagnosis present

## 2017-01-10 DIAGNOSIS — R918 Other nonspecific abnormal finding of lung field: Secondary | ICD-10-CM | POA: Diagnosis not present

## 2017-01-10 DIAGNOSIS — J690 Pneumonitis due to inhalation of food and vomit: Secondary | ICD-10-CM | POA: Diagnosis not present

## 2017-01-10 DIAGNOSIS — S3720XA Unspecified injury of bladder, initial encounter: Secondary | ICD-10-CM

## 2017-01-10 DIAGNOSIS — N8501 Benign endometrial hyperplasia: Principal | ICD-10-CM | POA: Diagnosis present

## 2017-01-10 DIAGNOSIS — Z9889 Other specified postprocedural states: Secondary | ICD-10-CM

## 2017-01-10 DIAGNOSIS — N9971 Accidental puncture and laceration of a genitourinary system organ or structure during a genitourinary system procedure: Secondary | ICD-10-CM | POA: Diagnosis not present

## 2017-01-10 DIAGNOSIS — N800 Endometriosis of uterus: Secondary | ICD-10-CM | POA: Diagnosis not present

## 2017-01-10 DIAGNOSIS — F17210 Nicotine dependence, cigarettes, uncomplicated: Secondary | ICD-10-CM | POA: Diagnosis not present

## 2017-01-10 DIAGNOSIS — R000 Tachycardia, unspecified: Secondary | ICD-10-CM | POA: Diagnosis not present

## 2017-01-10 DIAGNOSIS — K219 Gastro-esophageal reflux disease without esophagitis: Secondary | ICD-10-CM | POA: Diagnosis present

## 2017-01-10 DIAGNOSIS — F209 Schizophrenia, unspecified: Secondary | ICD-10-CM | POA: Diagnosis present

## 2017-01-10 DIAGNOSIS — N8500 Endometrial hyperplasia, unspecified: Secondary | ICD-10-CM | POA: Diagnosis not present

## 2017-01-10 DIAGNOSIS — J449 Chronic obstructive pulmonary disease, unspecified: Secondary | ICD-10-CM | POA: Diagnosis not present

## 2017-01-10 DIAGNOSIS — S301XXA Contusion of abdominal wall, initial encounter: Secondary | ICD-10-CM | POA: Diagnosis not present

## 2017-01-10 HISTORY — PX: CYSTOSCOPY: SHX5120

## 2017-01-10 HISTORY — PX: VAGINAL HYSTERECTOMY: SHX2639

## 2017-01-10 SURGERY — HYSTERECTOMY, VAGINAL
Anesthesia: General | Site: Bladder

## 2017-01-10 MED ORDER — HYDROMORPHONE HCL 1 MG/ML IJ SOLN
INTRAMUSCULAR | Status: DC | PRN
  Administered 2017-01-10 (×2): 0.5 mg via INTRAVENOUS

## 2017-01-10 MED ORDER — DEXAMETHASONE SODIUM PHOSPHATE 10 MG/ML IJ SOLN
INTRAMUSCULAR | Status: AC
  Filled 2017-01-10: qty 1

## 2017-01-10 MED ORDER — LACTATED RINGERS IV SOLN
INTRAVENOUS | Status: DC
  Administered 2017-01-10 (×3): via INTRAVENOUS

## 2017-01-10 MED ORDER — HYDROMORPHONE HCL 1 MG/ML IJ SOLN
0.2500 mg | INTRAMUSCULAR | Status: DC | PRN
  Administered 2017-01-10 (×4): 0.5 mg via INTRAVENOUS

## 2017-01-10 MED ORDER — QUETIAPINE FUMARATE 300 MG PO TABS
300.0000 mg | ORAL_TABLET | Freq: Every day | ORAL | Status: DC
  Administered 2017-01-11: 300 mg via ORAL
  Filled 2017-01-10 (×5): qty 1

## 2017-01-10 MED ORDER — KETOROLAC TROMETHAMINE 30 MG/ML IJ SOLN
INTRAMUSCULAR | Status: DC | PRN
  Administered 2017-01-10: 30 mg via INTRAVENOUS

## 2017-01-10 MED ORDER — DOCUSATE SODIUM 100 MG PO CAPS
100.0000 mg | ORAL_CAPSULE | Freq: Two times a day (BID) | ORAL | Status: DC
  Administered 2017-01-10 – 2017-01-13 (×5): 100 mg via ORAL
  Filled 2017-01-10 (×5): qty 1

## 2017-01-10 MED ORDER — MIDAZOLAM HCL 2 MG/2ML IJ SOLN
INTRAMUSCULAR | Status: DC | PRN
  Administered 2017-01-10: 2 mg via INTRAVENOUS

## 2017-01-10 MED ORDER — KETOROLAC TROMETHAMINE 30 MG/ML IJ SOLN
30.0000 mg | Freq: Four times a day (QID) | INTRAMUSCULAR | Status: AC
  Administered 2017-01-11 (×3): 30 mg via INTRAVENOUS
  Filled 2017-01-10 (×3): qty 1

## 2017-01-10 MED ORDER — ZOLPIDEM TARTRATE 5 MG PO TABS: 5.0000 mg | ORAL_TABLET | Freq: Every evening | ORAL | Status: DC | PRN

## 2017-01-10 MED ORDER — SIMETHICONE 80 MG PO CHEW
80.0000 mg | CHEWABLE_TABLET | Freq: Four times a day (QID) | ORAL | Status: DC | PRN
  Administered 2017-01-10 – 2017-01-11 (×2): 80 mg via ORAL
  Filled 2017-01-10 (×2): qty 1

## 2017-01-10 MED ORDER — METOCLOPRAMIDE HCL 5 MG/ML IJ SOLN: 10.0000 mg | Freq: Once | INTRAMUSCULAR | Status: DC | PRN

## 2017-01-10 MED ORDER — LACTATED RINGERS IV SOLN
INTRAVENOUS | Status: DC
  Administered 2017-01-11 (×2): 100 mL/h via INTRAVENOUS

## 2017-01-10 MED ORDER — LIDOCAINE HCL (CARDIAC) 20 MG/ML IV SOLN
INTRAVENOUS | Status: AC
  Filled 2017-01-10: qty 5

## 2017-01-10 MED ORDER — ONDANSETRON HCL 4 MG/2ML IJ SOLN
4.0000 mg | Freq: Four times a day (QID) | INTRAMUSCULAR | Status: DC | PRN
  Administered 2017-01-10 – 2017-01-11 (×3): 4 mg via INTRAVENOUS
  Filled 2017-01-10 (×3): qty 2

## 2017-01-10 MED ORDER — PROPOFOL 10 MG/ML IV BOLUS
INTRAVENOUS | Status: AC
  Filled 2017-01-10: qty 20

## 2017-01-10 MED ORDER — SUGAMMADEX SODIUM 200 MG/2ML IV SOLN
INTRAVENOUS | Status: DC | PRN
  Administered 2017-01-10: 200 mg via INTRAVENOUS

## 2017-01-10 MED ORDER — LIDOCAINE HCL (CARDIAC) 20 MG/ML IV SOLN
INTRAVENOUS | Status: DC | PRN
  Administered 2017-01-10: 80 mg via INTRAVENOUS

## 2017-01-10 MED ORDER — PHENYLEPHRINE 40 MCG/ML (10ML) SYRINGE FOR IV PUSH (FOR BLOOD PRESSURE SUPPORT)
PREFILLED_SYRINGE | INTRAVENOUS | Status: AC
  Filled 2017-01-10: qty 10

## 2017-01-10 MED ORDER — KETOROLAC TROMETHAMINE 30 MG/ML IJ SOLN: 30.0000 mg | Freq: Four times a day (QID) | INTRAMUSCULAR | Status: AC

## 2017-01-10 MED ORDER — GLYCOPYRROLATE 0.2 MG/ML IJ SOLN
INTRAMUSCULAR | Status: DC | PRN
  Administered 2017-01-10: 0.1 mg via INTRAVENOUS

## 2017-01-10 MED ORDER — SOD CITRATE-CITRIC ACID 500-334 MG/5ML PO SOLN
ORAL | Status: AC
  Administered 2017-01-10: 30 mL via ORAL
  Filled 2017-01-10: qty 15

## 2017-01-10 MED ORDER — ROCURONIUM BROMIDE 100 MG/10ML IV SOLN
INTRAVENOUS | Status: DC | PRN
  Administered 2017-01-10: 10 mg via INTRAVENOUS
  Administered 2017-01-10: 40 mg via INTRAVENOUS

## 2017-01-10 MED ORDER — FENTANYL CITRATE (PF) 250 MCG/5ML IJ SOLN
INTRAMUSCULAR | Status: AC
  Filled 2017-01-10: qty 5

## 2017-01-10 MED ORDER — HYDROMORPHONE HCL 1 MG/ML IJ SOLN
INTRAMUSCULAR | Status: AC
  Filled 2017-01-10: qty 1

## 2017-01-10 MED ORDER — FENTANYL CITRATE (PF) 100 MCG/2ML IJ SOLN
INTRAMUSCULAR | Status: AC
  Filled 2017-01-10: qty 2

## 2017-01-10 MED ORDER — MIDAZOLAM HCL 5 MG/ML IJ SOLN: 1.0000 mg | INTRAMUSCULAR | Status: DC | PRN

## 2017-01-10 MED ORDER — IBUPROFEN 800 MG PO TABS: 800.0000 mg | ORAL_TABLET | Freq: Three times a day (TID) | ORAL | Status: DC

## 2017-01-10 MED ORDER — ONDANSETRON HCL 4 MG/2ML IJ SOLN
INTRAMUSCULAR | Status: AC
  Filled 2017-01-10: qty 2

## 2017-01-10 MED ORDER — SERTRALINE HCL 50 MG PO TABS
50.0000 mg | ORAL_TABLET | Freq: Every day | ORAL | Status: DC
  Administered 2017-01-11 – 2017-01-13 (×2): 50 mg via ORAL
  Filled 2017-01-10 (×5): qty 1

## 2017-01-10 MED ORDER — STERILE WATER FOR IRRIGATION IR SOLN
Status: DC | PRN
  Administered 2017-01-10: 50 mL via INTRAVESICAL
  Administered 2017-01-10: 25 mL via INTRAVESICAL

## 2017-01-10 MED ORDER — MIDAZOLAM HCL 2 MG/2ML IJ SOLN
INTRAMUSCULAR | Status: AC
  Administered 2017-01-10: 2 mg
  Filled 2017-01-10: qty 2

## 2017-01-10 MED ORDER — SOD CITRATE-CITRIC ACID 500-334 MG/5ML PO SOLN
30.0000 mL | ORAL | Status: AC
  Administered 2017-01-10: 30 mL via ORAL

## 2017-01-10 MED ORDER — ONDANSETRON HCL 4 MG/2ML IJ SOLN
INTRAMUSCULAR | Status: DC | PRN
  Administered 2017-01-10: 4 mg via INTRAVENOUS

## 2017-01-10 MED ORDER — MIDAZOLAM HCL 2 MG/2ML IJ SOLN
INTRAMUSCULAR | Status: AC
  Filled 2017-01-10: qty 2

## 2017-01-10 MED ORDER — 0.9 % SODIUM CHLORIDE (POUR BTL) OPTIME
TOPICAL | Status: DC | PRN
  Administered 2017-01-10: 1000 mL

## 2017-01-10 MED ORDER — PHENAZOPYRIDINE HCL 100 MG PO TABS
100.0000 mg | ORAL_TABLET | Freq: Three times a day (TID) | ORAL | Status: DC
  Administered 2017-01-11 – 2017-01-13 (×7): 100 mg via ORAL
  Filled 2017-01-10 (×11): qty 1

## 2017-01-10 MED ORDER — CEFAZOLIN SODIUM-DEXTROSE 2-4 GM/100ML-% IV SOLN
2.0000 g | INTRAVENOUS | Status: AC
  Administered 2017-01-10: 2 g via INTRAVENOUS

## 2017-01-10 MED ORDER — DEXAMETHASONE SODIUM PHOSPHATE 10 MG/ML IJ SOLN
INTRAMUSCULAR | Status: DC | PRN
  Administered 2017-01-10: 10 mg via INTRAVENOUS

## 2017-01-10 MED ORDER — HYDROMORPHONE HCL 1 MG/ML IJ SOLN
0.5000 mg | INTRAMUSCULAR | Status: AC | PRN
  Administered 2017-01-10 (×2): 0.5 mg via INTRAVENOUS

## 2017-01-10 MED ORDER — STERILE WATER FOR IRRIGATION IR SOLN
Status: DC | PRN
  Administered 2017-01-10: 1000 mL via INTRAVESICAL

## 2017-01-10 MED ORDER — HYDROMORPHONE HCL 1 MG/ML IJ SOLN
INTRAMUSCULAR | Status: AC
Start: 2017-01-10 — End: 2017-01-11
  Filled 2017-01-10: qty 1

## 2017-01-10 MED ORDER — ROCURONIUM BROMIDE 100 MG/10ML IV SOLN
INTRAVENOUS | Status: AC
  Filled 2017-01-10: qty 1

## 2017-01-10 MED ORDER — CEFAZOLIN SODIUM-DEXTROSE 2-3 GM-%(50ML) IV SOLR
INTRAVENOUS | Status: AC
  Filled 2017-01-10: qty 50

## 2017-01-10 MED ORDER — PROPOFOL 10 MG/ML IV BOLUS
INTRAVENOUS | Status: DC | PRN
  Administered 2017-01-10: 150 mg via INTRAVENOUS
  Administered 2017-01-10: 80 mg via INTRAVENOUS

## 2017-01-10 MED ORDER — MIDAZOLAM BOLUS VIA INFUSION: 1.0000 mg | INTRAVENOUS | Status: DC | PRN

## 2017-01-10 MED ORDER — KETOROLAC TROMETHAMINE 30 MG/ML IJ SOLN
INTRAMUSCULAR | Status: AC
  Filled 2017-01-10: qty 1

## 2017-01-10 MED ORDER — PHENYLEPHRINE HCL 10 MG/ML IJ SOLN
INTRAMUSCULAR | Status: DC | PRN
  Administered 2017-01-10: 80 ug via INTRAVENOUS

## 2017-01-10 MED ORDER — ONDANSETRON HCL 4 MG PO TABS
4.0000 mg | ORAL_TABLET | Freq: Four times a day (QID) | ORAL | Status: DC | PRN
  Administered 2017-01-13: 4 mg via ORAL
  Filled 2017-01-10: qty 1

## 2017-01-10 MED ORDER — MEPERIDINE HCL 25 MG/ML IJ SOLN: 6.2500 mg | INTRAMUSCULAR | Status: DC | PRN

## 2017-01-10 MED ORDER — GLYCOPYRROLATE 0.2 MG/ML IJ SOLN
INTRAMUSCULAR | Status: AC
  Filled 2017-01-10: qty 1

## 2017-01-10 MED ORDER — SCOPOLAMINE 1 MG/3DAYS TD PT72
MEDICATED_PATCH | TRANSDERMAL | Status: AC
  Administered 2017-01-10: 1.5 mg
  Filled 2017-01-10: qty 1

## 2017-01-10 MED ORDER — SUGAMMADEX SODIUM 200 MG/2ML IV SOLN
INTRAVENOUS | Status: AC
  Filled 2017-01-10: qty 2

## 2017-01-10 MED ORDER — FENTANYL CITRATE (PF) 100 MCG/2ML IJ SOLN
INTRAMUSCULAR | Status: DC | PRN
  Administered 2017-01-10: 50 ug via INTRAVENOUS
  Administered 2017-01-10: 100 ug via INTRAVENOUS
  Administered 2017-01-10 (×4): 50 ug via INTRAVENOUS

## 2017-01-10 MED ORDER — LIDOCAINE HCL (CARDIAC) 20 MG/ML IV SOLN
INTRAVENOUS | Status: AC
  Filled 2017-01-10: qty 10

## 2017-01-10 SURGICAL SUPPLY — 34 items
BLADE SURG 10 STRL SS (BLADE) ×3 IMPLANT
CANISTER SUCT 3000ML PPV (MISCELLANEOUS) ×3 IMPLANT
CLOTH BEACON ORANGE TIMEOUT ST (SAFETY) ×3 IMPLANT
CONT PATH 16OZ SNAP LID 3702 (MISCELLANEOUS) IMPLANT
DECANTER SPIKE VIAL GLASS SM (MISCELLANEOUS) IMPLANT
FORMULA NB 0-3 ENFAMIL (FORMULA) ×3 IMPLANT
GLOVE BIO SURGEON STRL SZ7.5 (GLOVE) ×3 IMPLANT
GLOVE BIO SURGEON STRL SZ8 (GLOVE) ×3 IMPLANT
GLOVE BIOGEL PI IND STRL 6.5 (GLOVE) ×2 IMPLANT
GLOVE BIOGEL PI IND STRL 7.0 (GLOVE) ×4 IMPLANT
GLOVE BIOGEL PI INDICATOR 6.5 (GLOVE) ×1
GLOVE BIOGEL PI INDICATOR 7.0 (GLOVE) ×2
GLOVE SURG SS PI 7.0 STRL IVOR (GLOVE) ×3 IMPLANT
GOWN STRL REUS W/TWL LRG LVL3 (GOWN DISPOSABLE) ×9 IMPLANT
GOWN STRL REUS W/TWL XL LVL3 (GOWN DISPOSABLE) ×3 IMPLANT
NDL SAFETY ECLIPSE 18X1.5 (NEEDLE) ×2 IMPLANT
NEEDLE HYPO 18GX1.5 SHARP (NEEDLE) ×1
NS IRRIG 1000ML POUR BTL (IV SOLUTION) ×3 IMPLANT
PACK TRENDGUARD 600 HYBRD PROC (MISCELLANEOUS) IMPLANT
PACK VAGINAL WOMENS (CUSTOM PROCEDURE TRAY) ×3 IMPLANT
PAD OB MATERNITY 4.3X12.25 (PERSONAL CARE ITEMS) ×3 IMPLANT
SET CYSTO W/LG BORE CLAMP LF (SET/KITS/TRAYS/PACK) ×3 IMPLANT
SUT CHROMIC 3 0 SH 27 (SUTURE) ×6 IMPLANT
SUT VIC AB 2-0 CT1 18 (SUTURE) IMPLANT
SUT VIC AB 2-0 CT1 27 (SUTURE) ×1
SUT VIC AB 2-0 CT1 TAPERPNT 27 (SUTURE) ×2 IMPLANT
SUT VIC AB PLUS 45CM 1-MO-4 (SUTURE) ×12 IMPLANT
SUT VICRYL 0 ENDOLOOP (SUTURE) IMPLANT
SUT VICRYL 1 TIES 12X18 (SUTURE) ×3 IMPLANT
SYR 30ML LL (SYRINGE) ×3 IMPLANT
SYR BULB IRRIGATION 50ML (SYRINGE) ×3 IMPLANT
TOWEL OR 17X24 6PK STRL BLUE (TOWEL DISPOSABLE) ×9 IMPLANT
TRAY FOLEY CATH SILVER 14FR (SET/KITS/TRAYS/PACK) ×3 IMPLANT
TRENDGUARD 600 HYBRID PROC PK (MISCELLANEOUS)

## 2017-01-10 NOTE — Transfer of Care (Signed)
Immediate Anesthesia Transfer of Care Note  Patient: Mary FurlongDianne [name]  Procedure(s) Performed: HYSTERECTOMY VAGINAL W/ BILATERAL SALPINGECTOMY WITH REPAIR OF INCIDENTAL CYSTOTOMY (N/A ) CYSTOSCOPY (N/A Bladder)  Patient Location: PACU  Anesthesia Type:General  Level of Consciousness: sedated  Airway & Oxygen Therapy: Patient Spontanous Breathing and Patient connected to nasal cannula oxygen  Post-op Assessment: Report given to RN and Post -op Vital signs reviewed and stable  Post vital signs: Reviewed and stable  Last Vitals:  Vitals:   01/10/17 1051  BP: 125/84  Pulse: 69  Resp: 16  Temp: 36.7 C  SpO2: 99%    Last Pain: There were no vitals filed for this visit.    Patients Stated Pain Goal: 4 (01/10/17 1051)  Complications: No apparent anesthesia complications

## 2017-01-10 NOTE — Anesthesia Procedure Notes (Signed)
Procedure Name: Intubation Date/Time: 01/10/2017 12:32 PM Performed by: Casimer Lanius A Pre-anesthesia Checklist: Patient identified, Emergency Drugs available, Suction available and Patient being monitored Patient Re-evaluated:Patient Re-evaluated prior to induction Oxygen Delivery Method: Circle system utilized and Simple face mask Preoxygenation: Pre-oxygenation with 100% oxygen Induction Type: IV induction Ventilation: Mask ventilation without difficulty Laryngoscope Size: Mac and 3 Grade View: Grade III Tube type: Oral Tube size: 7.0 mm Number of attempts: 1 Airway Equipment and Method: Stylet Placement Confirmation: ETT inserted through vocal cords under direct vision,  positive ETCO2 and breath sounds checked- equal and bilateral Secured at: 20 (right lip) cm Tube secured with: Tape Dental Injury: Teeth and Oropharynx as per pre-operative assessment

## 2017-01-10 NOTE — Plan of Care (Signed)
Problem: Safety: Goal: Ability to remain free from injury will improve Outcome: Progressing Patient observed on hourly rounds. Call bell within reach.   Problem: Pain Managment: Goal: General experience of comfort will improve Outcome: Progressing Patient given PRN medications as needed. Results of medication assessed within one hour of administration.

## 2017-01-10 NOTE — Op Note (Signed)
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 OPERATION:  Total Vaginal hysterectomy, repair of cystotomy and cystoscope ANESTHESIA:  General endotracheal.  INDICATIONS: The patient is a 51 y.o. G5P0 with history of simple hyperplasia without atypia of the endometrium.   The patient made a decision to undergo definite surgical treatment. On the preoperative visit, the risks, benefits, indications, and alternatives of the procedure were reviewed with the patient.  On the day of surgery, the risks of surgery were again discussed with the patient including but not limited to: bleeding which may require transfusion or reoperation; infection which may require antibiotics; injury to bowel, bladder, ureters or other surrounding organs; need for additional procedures; thromboembolic phenomenon, incisional problems and other postoperative/anesthesia complications. Written informed consent was obtained.    OPERATIVE FINDINGS: A 8 week size uterus with normal tubes and ovaries bilaterally.  ESTIMATED BLOOD LOSS: 350 ml FLUIDS:  As recorded  URINE OUTPUT:  250 ml of milky blood tinged urine SPECIMENS:  Uterus and cervix sent to pathology COMPLICATIONS:  None immediate.  DESCRIPTION OF PROCEDURE:  The patient received intravenous antibiotics and had sequential compression devices applied to her lower extremities while in the preoperative area.  She was then taken to the operating room where general anesthesia was administered and was found to be adequate.  Surgerical time was completed She was placed in the dorsal lithotomy position, and was prepped and draped in a sterile manner.  The patients bladder was drained and foley was left indwelling.  A weighted speculum was then placed in the vagina, and the  posterior  lip of the cervix were grasped with a tenaculum  The posterior cul-de-sac was entered sharply without difficulty.  A long weighted speculum was inserted into the posterior cul-de-sac. Attention was then directed to the anterior vaginal mucosa. The anterior lip of the cervix was grasped with a tenaculum. The vaginal mucosa was sharply circumscribed and dissected off the underlying pubocervical fascia. The anterior peritoneum was identified and entry was gained anteriorly. This was verified by the presence of bowel. Zeplin clamps were then used to clamp the uterosacral ligaments on either side.  They were then cut and sutured ligated with 0 Vicryl.  The uterine vessels, cardinal and broad ligaments were then serially clamped with the Zeplin clamps, cut, and suture ligated on both sides. The final pedicles involving the IP were clamped and secured with 0 Vicryl in a free tie fashion and then in a HerrickBonney fashion. All pedicles were reviewed. Additional sutures were placed to secure hemostasis.   Excellent hemostasis was noted of all pedicles. I was unable to visualize the Fallopian tubes and thus elected not to proceed with salpingectomy. With removal of the anterior deaver, expulsion of urine was noted. Inspection of the bladder noted a 1 cm cystotomy. The cystotomy was closed in layered fashion with 3/0 chromic. The first layer involved the mucosa only. The second layer involved the muscularis layer. Both of these layers were placed in vertical direction. A third layer involving the muscularis was placed in a horizontal direction.  The bladder was instilled with 75 cc of sterile milk and no leaking from the cystotomy site was noted.  Dr. Macon LargeAnyanwu them performed cystoscope with 70 degree scope. 300 cc of sterile water was used to visualized the bladder mucosa. The cystotomy site was noted and was observed  to be intact. Both ureteral ofices were noted and were noted not to be closed the repair site. No other defects  were observed.   Attention was then directed back to the vagina. The cystotomy site was observed again and no evidence of leaking was noted. Hemostasis remianed. All tags were cut. The vaginal cuff was then closed with 2/0 Vicryl in an interrupted fashion.  All instruments were then removed from the pelvis.  The patient tolerated the procedure well.  All instruments, needles, and sponge counts were correct x 2. The patient was taken to the recovery room in stable condition.

## 2017-01-10 NOTE — Anesthesia Preprocedure Evaluation (Signed)
Anesthesia Evaluation  Patient identified by MRN, date of birth, ID band Patient awake    Reviewed: Allergy & Precautions, NPO status , Patient's Chart, lab work & pertinent test results  Airway Mallampati: II  TM Distance: >3 FB Neck ROM: Full    Dental  (+) Poor Dentition, Missing, Caps   Pulmonary COPD,  COPD inhaler, Current Smoker,    Pulmonary exam normal breath sounds clear to auscultation       Cardiovascular negative cardio ROS Normal cardiovascular exam Rhythm:Regular Rate:Normal     Neuro/Psych PSYCHIATRIC DISORDERS Anxiety Bipolar Disorder Schizophrenia  Neuromuscular disease    GI/Hepatic hiatal hernia, GERD  Medicated and Controlled,(+)     substance abuse  , Hepatitis -, CHx/o polysubstance abuse - last use 12/2012   Endo/Other  negative endocrine ROS  Renal/GU negative Renal ROS  negative genitourinary   Musculoskeletal negative musculoskeletal ROS (+)   Abdominal   Peds  Hematology   Anesthesia Other Findings   Reproductive/Obstetrics Endometrial hyperplasia                             Anesthesia Physical Anesthesia Plan  ASA: II  Anesthesia Plan: General   Post-op Pain Management:    Induction: Intravenous  PONV Risk Score and Plan: 3 and Ondansetron, Dexamethasone, Midazolam and Propofol infusion  Airway Management Planned: Oral ETT  Additional Equipment:   Intra-op Plan:   Post-operative Plan: Extubation in OR  Informed Consent: I have reviewed the patients History and Physical, chart, labs and discussed the procedure including the risks, benefits and alternatives for the proposed anesthesia with the patient or authorized representative who has indicated his/her understanding and acceptance.   Dental advisory given  Plan Discussed with: CRNA, Anesthesiologist and Surgeon  Anesthesia Plan Comments:         Anesthesia Quick Evaluation

## 2017-01-10 NOTE — Anesthesia Postprocedure Evaluation (Signed)
Anesthesia Post Note  Patient: Mary [name]  Procedure(s) Performed: HYSTERECTOMY VAGINAL W/ BILATERAL SALPINGECTOMY WITH REPAIR OF INCIDENTAL CYSTOTOMY (N/A ) CYSTOSCOPY (N/A Bladder)     Patient location during evaluation: PACU Anesthesia Type: General Level of consciousness: sedated, oriented and patient cooperative Pain management: pain level controlled Vital Signs Assessment: post-procedure vital signs reviewed and stable Respiratory status: spontaneous breathing, nonlabored ventilation, respiratory function stable and patient connected to nasal cannula oxygen Cardiovascular status: blood pressure returned to baseline and stable Postop Assessment: no apparent nausea or vomiting Anesthetic complications: no    Last Vitals:  Vitals:   01/10/17 1630 01/10/17 1645  BP: 95/75 101/70  Pulse: (!) 104 (!) 118  Resp: 13 13  Temp:    SpO2: 95% 96%    Last Pain:  Vitals:   01/10/17 1715  PainSc: Asleep   Pain Goal: Patients Stated Pain Goal: 4 (01/10/17 1715)               Erling CruzJACKSON,E. Edras Wilford

## 2017-01-10 NOTE — H&P (Signed)
Mary [name] is an 51 y.o. G61P5  female whose last normal cycle was in March/April. She had no bleeding until this past August. She at that time had several days of spotting. Was seen in [facility] for this plus abd pain. Abd pain has resolved. She had an U/S which revealed thicken endometrium. She underwent an [facility] which demonstrated simple hyperplasia without atypia.  Management options were reviewed with pt, including medical and surgery. She desires surgerical management.  TVH/BS reviewed with pt.   Pertinent Gynecological History: Contraception: tubal ligation Sexually transmitted diseases: denies Previous GYN Procedures: denies  Last mammogram: normal Date: 2018 Last pap: normal Date: 2018 OB History: G5, P5 All TSVD  Menstrual History: Menarche age: 58 No LMP recorded. Patient is not currently having periods (Reason: Irregular Periods).    Past Medical History:  Diagnosis Date  . Bipolar depression (HCC)   . Borderline personality disorder (HCC)   . COPD (chronic obstructive pulmonary disease) (HCC)   . GERD (gastroesophageal reflux disease)   . Hiatal hernia   . History of alcohol abuse    per pt none since 10/ 2014  . History of attempted suicide   . History of drug abuse in remission    per pt in remission since 12-26-2012  polysubstance dependence (alcohol, crack, cocaine, 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  . History of hepatitis C per pt first dx 2009 (approx)   consulted w/ dr comer (infectious disease) note 04-26-2016 , pt states was called and told per lab work no longer has hepatitis c   . History of kidney stones   . Hypercalcemia    mild and long-standing--- pt asymptomatic  . Primary hyperparathyroidism [facility])    endocrinology --  dr Lucianne Muss  . Pruritic erythematous rash    lower legs  . PTSD (post-traumatic stress disorder)   . Right ureteral stone   . Schizophrenia (HCC)   . Vitamin D deficiency     Past Surgical History:  Procedure Laterality  Date  . CYSTOSCOPY W/ URETERAL STENT PLACEMENT Right 07/14/2016   Procedure: CYSTOSCOPY WITH STENT REPLACEMENT;  Surgeon: Malen Gauze, MD;  Location: [facility];  Service: Urology;  Laterality: Right;  . CYSTOSCOPY WITH RETROGRADE PYELOGRAM, URETEROSCOPY AND STENT PLACEMENT Right 06/28/2016   Procedure: CYSTOSCOPY WITH RETROGRADE PYELOGRAM, URETEROSCOPY AND STENT PLACEMENT;  Surgeon: Malen Gauze, MD;  Location: [facility];  Service: Urology;  Laterality: Right;  . CYSTOSCOPY/RETROGRADE/URETEROSCOPY/STONE EXTRACTION WITH BASKET Right 07/14/2016   Procedure: CYSTOSCOPY/RETROGRADE/URETEROSCOPY/STONE EXTRACTION WITH BASKET;  Surgeon: Malen Gauze, MD;  Location: [facility];  Service: Urology;  Laterality: Right;  . DIAGNOSTIC LAPAROSCOPIC LIVER BIOPSY N/A 02/10/2013   Procedure: DIAGNOSTIC LAPAROSCOPIC ;  Surgeon: Ardeth Sportsman, MD;  Location: [facility];  Service: General;  Laterality: N/A;  DIAGNOSTIC LAPAROSCOPY,laparoscopic ventral hernia repair with mesh lysis of adhesions  . HERNIA REPAIR    . HOLMIUM LASER APPLICATION Right 07/14/2016   Procedure: HOLMIUM LASER APPLICATION;  Surgeon: Malen Gauze, MD;  Location: [facility];  Service: Urology;  Laterality: Right;  . SPLENECTOMY  2004  . TUBAL LIGATION      Family History  Problem Relation Age of Onset  . Alcohol abuse Mother   . Alcohol abuse Father   . Cancer Maternal Grandmother        colon  . Diabetes Maternal Grandmother   . Hypertension Maternal Grandmother   . Alcohol abuse Sister   . Thyroid disease Neg Hx  Social History:  reports that she has been smoking Cigarettes.  She has a 32.00 pack-year smoking history. She has never used smokeless tobacco. She reports that she does not drink alcohol or use drugs.  Allergies: No Known Allergies  No prescriptions prior to admission.    Review of Systems  Constitutional: Negative.   Respiratory: Negative.    Cardiovascular: Negative.   Gastrointestinal: Negative.   Genitourinary: Negative.     There were no vitals taken for this visit. Physical Exam  Constitutional: She appears well-developed and well-nourished.  Cardiovascular: Normal rate, regular rhythm and normal heart sounds.   Respiratory: Effort normal and breath sounds normal.  GI: Soft. Bowel sounds are normal.  Genitourinary:  Genitourinary Comments: Nl EGBUS, uterus small < 10 week size mobile non tender no adnexal masses or tenderness    No results found for this or any previous visit (from the past 24 hour(s)).  No results found.  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 TVH/BS R/B/Post op care have been reviewed with pt. She has verbalized understanding and desires to proceed. Pt being admitted today for said procedure.   Hermina StaggersMichael L Lajuanna Pompa 01/10/2017, 10:35 AM

## 2017-01-11 ENCOUNTER — Encounter (HOSPITAL_COMMUNITY): Payer: Self-pay | Admitting: Obstetrics and Gynecology

## 2017-01-11 LAB — BASIC METABOLIC PANEL
Anion gap: 3 — ABNORMAL LOW (ref 5–15)
Anion gap: 5 (ref 5–15)
BUN: 17 mg/dL (ref 6–20)
BUN: 17 mg/dL (ref 6–20)
CHLORIDE: 105 mmol/L (ref 101–111)
CO2: 28 mmol/L (ref 22–32)
CO2: 28 mmol/L (ref 22–32)
CREATININE: 1.07 mg/dL — AB (ref 0.44–1.00)
Calcium: 9.9 mg/dL (ref 8.9–10.3)
Calcium: 9.8 mg/dL (ref 8.9–10.3)
Chloride: 102 mmol/L (ref 101–111)
Creatinine, Ser: 1.08 mg/dL — ABNORMAL HIGH (ref 0.44–1.00)
GFR calc Af Amer: >60 mL/min (ref >60)
GFR calc Af Amer: >60 mL/min (ref >60)
GFR calc non Af Amer: 59 mL/min — ABNORMAL LOW (ref >60)
GFR, EST NON AFRICAN AMERICAN: 58 mL/min — AB (ref >60)
GLUCOSE: 130 mg/dL — AB (ref 65–99)
GLUCOSE: 112 mg/dL — AB (ref 65–99)
Potassium: 5.2 mmol/L — ABNORMAL HIGH (ref 3.5–5.1)
Potassium: 4.5 mmol/L (ref 3.5–5.1)
Sodium: 136 mmol/L (ref 135–145)
Sodium: 135 mmol/L (ref 135–145)

## 2017-01-11 LAB — CBC
HCT: 25.7 % — ABNORMAL LOW (ref 36.0–46.0)
HCT: 23.7 % — ABNORMAL LOW (ref 36.0–46.0)
HEMOGLOBIN: 9.1 g/dL — AB (ref 12.0–15.0)
Hemoglobin: 8.2 g/dL — ABNORMAL LOW (ref 12.0–15.0)
MCH: 32.4 pg (ref 26.0–34.0)
MCH: 31.8 pg (ref 26.0–34.0)
MCHC: 35.4 g/dL (ref 30.0–36.0)
MCHC: 34.6 g/dL (ref 30.0–36.0)
MCV: 91.5 fL (ref 78.0–100.0)
MCV: 91.9 fL (ref 78.0–100.0)
PLATELETS: 245 10*3/uL (ref 150–400)
PLATELETS: 234 10*3/uL (ref 150–400)
RBC: 2.81 MIL/uL — AB (ref 3.87–5.11)
RBC: 2.58 MIL/uL — AB (ref 3.87–5.11)
RDW: 13.3 % (ref 11.5–15.5)
RDW: 13.3 % (ref 11.5–15.5)
WBC: 21.9 10*3/uL — ABNORMAL HIGH (ref 4.0–10.5)
WBC: 20.8 10*3/uL — ABNORMAL HIGH (ref 4.0–10.5)

## 2017-01-11 MED ORDER — HYDROMORPHONE HCL 1 MG/ML IJ SOLN
1.0000 mg | INTRAMUSCULAR | Status: DC | PRN
  Administered 2017-01-11 – 2017-01-13 (×6): 1 mg via INTRAVENOUS
  Filled 2017-01-11 (×6): qty 1

## 2017-01-11 MED ORDER — MEPERIDINE HCL 25 MG/ML IJ SOLN
6.2500 mg | Freq: Once | INTRAMUSCULAR | Status: AC
  Administered 2017-01-11: 6.25 mg via INTRAVENOUS
  Filled 2017-01-11: qty 1

## 2017-01-11 MED ORDER — LACTATED RINGERS IV BOLUS (SEPSIS): 500.0000 mL | Freq: Once | INTRAVENOUS | Status: DC

## 2017-01-11 MED ORDER — LACTATED RINGERS IV SOLN
INTRAVENOUS | Status: DC
  Administered 2017-01-12 (×2): via INTRAVENOUS

## 2017-01-11 MED ORDER — PROMETHAZINE HCL 25 MG/ML IJ SOLN
12.5000 mg | Freq: Once | INTRAMUSCULAR | Status: AC
  Administered 2017-01-11: 12.5 mg via INTRAVENOUS
  Filled 2017-01-11: qty 1

## 2017-01-11 MED ORDER — OXYCODONE-ACETAMINOPHEN 5-325 MG PO TABS
1.0000 | ORAL_TABLET | Freq: Four times a day (QID) | ORAL | Status: DC | PRN
  Administered 2017-01-12 – 2017-01-13 (×4): 1 via ORAL
  Filled 2017-01-11 (×6): qty 1

## 2017-01-11 MED ORDER — BISACODYL 10 MG RE SUPP
10.0000 mg | Freq: Once | RECTAL | Status: AC
  Administered 2017-01-11: 10 mg via RECTAL
  Filled 2017-01-11: qty 1

## 2017-01-11 MED ORDER — BUTALBITAL-APAP-CAFFEINE 50-325-40 MG PO TABS
2.0000 | ORAL_TABLET | Freq: Once | ORAL | Status: AC
  Administered 2017-01-11: 2 via ORAL
  Filled 2017-01-11: qty 2

## 2017-01-11 MED ORDER — PANTOPRAZOLE SODIUM 40 MG PO TBEC
40.0000 mg | DELAYED_RELEASE_TABLET | Freq: Every day | ORAL | Status: DC
  Administered 2017-01-11 – 2017-01-13 (×3): 40 mg via ORAL
  Filled 2017-01-11 (×3): qty 1

## 2017-01-11 NOTE — Progress Notes (Signed)
GYN Note  CTSP re: change in vital signs and mentation.   Patient became tachycardic and had O2 sats to the 80s after checking VS when patient had a change in mentation.  Denies any chest pain but endorses SOB; also denies HA  Current Vital Signs 24h Vital Sign Ranges  T 99.1 F (37.3 C) Temp  Avg: 98.6 F (37 C)  Min: 98.4 F (36.9 C)  Max: 99.1 F (37.3 C)  BP 116/69 BP  Min: 81/59  Max: 119/62  HR (!) 138 Pulse  Avg: 96.4  Min: 68  Max: 138  RR 18 Resp  Avg: 19.2  Min: 18  Max: 20  SaO2 98 % Nasal Cannula SpO2  Avg: 90.8 %  Min: 80 %  Max: 98 %       24 Hour I/O Current Shift I/O  Time Ins Outs 10/31 0701 - 11/01 0700 In: 3120 [P.O.:520; I.V.:2600] Out: 895 [Urine:545] No intake/output data recorded.    Patient Vitals for the past 24 hrs:  BP Temp Temp src Pulse Resp SpO2  01/11/17 2338 - - - - - 98 %  01/11/17 2337 116/69 99.1 F (37.3 C) - (!) 138 18 (!) 80 %  01/11/17 2011 (!) 81/59 98.6 F (37 C) Oral 84 18 90 %  01/11/17 1545 112/66 98.4 F (36.9 C) Oral - 20 92 %  01/11/17 1130 111/74 98.6 F (37 C) Oral 100 19 92 %  01/11/17 0805 119/62 98.4 F (36.9 C) Oral 92 20 93 %  01/11/17 0600 - - - - - (!) 89 %  01/11/17 0410 110/67 98.7 F (37.1 C) Oral 68 20 92 %   Gen: NAD Normal s1 and s2, no MRGs CTAB, no resp distress. Pt with [place] on Abdomen: soft Neuro: pt able to identify who she is, where she is and the president but doesn't give correct day or year. Patient sometimes rambling incoherently GU: no gross VB. Foley with approx 300mL UOP which was last emptied at approx 1900.   A/P: pt stable Continue with serial VS Continue with O2 and wean to room air Will get bmp, cbc, mg, troponins x 3, UDS Will get pCXR, ECG and consider CT PE  Cornelia Copaharlie Clelia Trabucco, Jr MD Attending Center for [facility] (Faculty Practice) 01/12/2017 Time: 1203am

## 2017-01-11 NOTE — Progress Notes (Signed)
Dr. Shawnie PonsPratt notified of urine output 150ml since 0000. Will evaluate AM labs.

## 2017-01-11 NOTE — Progress Notes (Signed)
Contact with Dr. Alysia PennaErvin in regards to pain management. Pt states that she is having a headache and "overall" discomfort, rating pain 7/10 which is unrelieved with Toradol. Dr. Alysia PennaErvin will be in this morning to evaluate. No new orders given. Carmelina DaneERRI L Caitlan Chauca, RN

## 2017-01-11 NOTE — Progress Notes (Signed)
Patient c/o thirst. Ice chips provided. Pt has drunk 2 cups of ginger ale and vomited both up. C/o feeling "like I got to pee".Catherter putting out small amounts. 1830 til Midnight =50 ml. Bladder scan performed and machine recorder "39ml". Dr. Shawnie PonsPratt notified and orders received for IV bolus of 500 ml and Demerol 6.25 mg IV for the riggers.

## 2017-01-11 NOTE — Addendum Note (Signed)
Addendum  created 01/11/17 0807 by Earmon PhoenixWilkerson, Diezel Mazur P, CRNA   Sign clinical note

## 2017-01-11 NOTE — Progress Notes (Signed)
Pt nauseated and vomited 75cc clear, yellow emesis. Dr. Alysia PennaErvin contacted. Order received for 12.5 phenergan. Carmelina DaneERRI L Jatinder Mcdonagh, RN

## 2017-01-11 NOTE — Anesthesia Postprocedure Evaluation (Signed)
Anesthesia Post Note  Patient: Mary FurlongDianne [name]  Procedure(s) Performed: HYSTERECTOMY VAGINAL W/ BILATERAL SALPINGECTOMY WITH REPAIR OF INCIDENTAL CYSTOTOMY (N/A ) CYSTOSCOPY (N/A Bladder)     Patient location during evaluation: [facility] Anesthesia Type: General Level of consciousness: awake and awake and alert Pain management: pain level controlled Vital Signs Assessment: post-procedure vital signs reviewed and stable Respiratory status: spontaneous breathing (2L [place] with SaO2 94%) Cardiovascular status: stable Postop Assessment: no apparent nausea or vomiting Anesthetic complications: no    Last Vitals:  Vitals:   01/11/17 0410 01/11/17 0600  BP: 110/67   Pulse: 68   Resp: 20   Temp: 37.1 C   SpO2: 92% (!) 89%    Last Pain:  Vitals:   01/11/17 0545  TempSrc:   PainSc: 4    Pain Goal: Patients Stated Pain Goal: 4 (01/10/17 2215)               Edison PaceWILKERSON,Naiyana Barbian

## 2017-01-11 NOTE — Progress Notes (Signed)
1 Day Post-Op Procedure(s) (LRB): HYSTERECTOMY VAGINAL W/ BILATERAL SALPINGECTOMY WITH REPAIR OF INCIDENTAL CYSTOTOMY (N/A) CYSTOSCOPY (N/A)  Subjective: Patient reports headache and general abd discomfort. Had some N/V last evening but this has resolved.Tolerating sips. Pain medication helps with pain some.    Objective: AF VSS UOP > 300 cc in last 3 hrs Lungs clear Heart RRR Abd soft + BS faint diffuse tenderness no rebound Back no CVA tenderness GU foley Pyridium stained urine noted, no gross hematuria noted Ext Non tender  Labs noted   Assessment/Plan s/p Procedure(s): HYSTERECTOMY VAGINAL W/ BILATERAL SALPINGECTOMY WITH REPAIR OF INCIDENTAL CYSTOTOMY (N/A) CYSTOSCOPY (N/A):  Will repeat labs at noon to monitor Crt, WBC and K Continue IV fluids Adjust pain medication Advance diet as tolerates Encourage ambulation  Incidental cystotomy and repair reviewed with pt. Indications and need for foley reviewed with pt. Questions regarding post op care discussed with pt.          LOS: 0 days    Mary StaggersMichael L Geraline [name] 01/11/2017, 9:46 AM

## 2017-01-11 NOTE — Progress Notes (Signed)
Pt ambulating in room. Peri care performed. Carmelina DaneERRI L Syan Cullimore, RN

## 2017-01-11 NOTE — Progress Notes (Signed)
Patient ID: [name], female   DOB: 10/17/1965, 51 y.o.   MRN: [id number]  Pt feeling some better.  Pain and HA better controlled Has ambulated in hallway No flatus yet No emesis since noon. Some nausea No appetite.  PE AF VSS  UOP > 100 cc per hr No Change  A/P Stable  Labs have improved and Crt essentially unchanged Will stimulate bowels. Repeat labs in AM Spoke with radiology regarding evaluation of bladder, cystogram ordered Continue with supportive care

## 2017-01-12 ENCOUNTER — Encounter (HOSPITAL_COMMUNITY): Payer: Self-pay | Admitting: Radiology

## 2017-01-12 ENCOUNTER — Observation Stay (HOSPITAL_COMMUNITY): Payer: BLUE CROSS/BLUE SHIELD

## 2017-01-12 DIAGNOSIS — R918 Other nonspecific abnormal finding of lung field: Secondary | ICD-10-CM | POA: Diagnosis not present

## 2017-01-12 DIAGNOSIS — N8501 Benign endometrial hyperplasia: Secondary | ICD-10-CM | POA: Diagnosis present

## 2017-01-12 DIAGNOSIS — S301XXA Contusion of abdominal wall, initial encounter: Secondary | ICD-10-CM | POA: Diagnosis not present

## 2017-01-12 DIAGNOSIS — J690 Pneumonitis due to inhalation of food and vomit: Secondary | ICD-10-CM | POA: Diagnosis not present

## 2017-01-12 DIAGNOSIS — F209 Schizophrenia, unspecified: Secondary | ICD-10-CM | POA: Diagnosis present

## 2017-01-12 DIAGNOSIS — F17210 Nicotine dependence, cigarettes, uncomplicated: Secondary | ICD-10-CM | POA: Diagnosis present

## 2017-01-12 DIAGNOSIS — R000 Tachycardia, unspecified: Secondary | ICD-10-CM | POA: Diagnosis not present

## 2017-01-12 DIAGNOSIS — N9971 Accidental puncture and laceration of a genitourinary system organ or structure during a genitourinary system procedure: Secondary | ICD-10-CM | POA: Diagnosis not present

## 2017-01-12 DIAGNOSIS — K219 Gastro-esophageal reflux disease without esophagitis: Secondary | ICD-10-CM | POA: Diagnosis present

## 2017-01-12 DIAGNOSIS — F3130 Bipolar disorder, current episode depressed, mild or moderate severity, unspecified: Secondary | ICD-10-CM | POA: Diagnosis present

## 2017-01-12 DIAGNOSIS — J9811 Atelectasis: Secondary | ICD-10-CM | POA: Diagnosis not present

## 2017-01-12 LAB — RAPID URINE DRUG SCREEN, HOSP PERFORMED
AMPHETAMINES: NOT DETECTED
Barbiturates: POSITIVE — AB
Benzodiazepines: POSITIVE — AB
Cocaine: NOT DETECTED
Opiates: POSITIVE — AB
Tetrahydrocannabinol: POSITIVE — AB

## 2017-01-12 LAB — BASIC METABOLIC PANEL
Anion gap: 7 (ref 5–15)
BUN: 19 mg/dL (ref 6–20)
CHLORIDE: 103 mmol/L (ref 101–111)
CO2: 29 mmol/L (ref 22–32)
Calcium: 10.1 mg/dL (ref 8.9–10.3)
Creatinine, Ser: 0.87 mg/dL (ref 0.44–1.00)
GFR calc non Af Amer: >60 mL/min (ref >60)
Glucose, Bld: 133 mg/dL — ABNORMAL HIGH (ref 65–99)
POTASSIUM: 4.2 mmol/L (ref 3.5–5.1)
SODIUM: 139 mmol/L (ref 135–145)

## 2017-01-12 LAB — CBC
HCT: 22.6 % — ABNORMAL LOW (ref 36.0–46.0)
HEMOGLOBIN: 7.9 g/dL — AB (ref 12.0–15.0)
MCH: 32.2 pg (ref 26.0–34.0)
MCHC: 35 g/dL (ref 30.0–36.0)
MCV: 92.2 fL (ref 78.0–100.0)
Platelets: 219 10*3/uL (ref 150–400)
RBC: 2.45 MIL/uL — AB (ref 3.87–5.11)
RDW: 13.2 % (ref 11.5–15.5)
WBC: 18.9 10*3/uL — AB (ref 4.0–10.5)

## 2017-01-12 LAB — TROPONIN I
TROPONIN I: 0.04 ng/mL — AB (ref <0.03)
Troponin I: 0.03 ng/mL (ref <0.03)
Troponin I: 0.04 ng/mL (ref <0.03)

## 2017-01-12 LAB — D-DIMER, QUANTITATIVE (NOT AT ARMC): D DIMER QUANT: 1.89 ug{FEU}/mL — AB (ref 0.00–0.50)

## 2017-01-12 LAB — MAGNESIUM: MAGNESIUM: 1.7 mg/dL (ref 1.7–2.4)

## 2017-01-12 MED ORDER — IOPAMIDOL (ISOVUE-370) INJECTION 76%
100.0000 mL | Freq: Once | INTRAVENOUS | Status: AC | PRN
  Administered 2017-01-12: 100 mL via INTRAVENOUS

## 2017-01-12 MED ORDER — IOPAMIDOL (ISOVUE-370) INJECTION 76%: 100.0000 mL | Freq: Once | INTRAVENOUS | Status: DC | PRN

## 2017-01-12 MED ORDER — IOPAMIDOL (ISOVUE-300) INJECTION 61%
30.0000 mL | Freq: Once | INTRAVENOUS | Status: AC | PRN
  Administered 2017-01-12: 30 mL via URETHRAL

## 2017-01-12 MED ORDER — SODIUM CHLORIDE 0.9 % IV SOLN
3.0000 g | Freq: Four times a day (QID) | INTRAVENOUS | Status: DC
  Administered 2017-01-12 – 2017-01-13 (×6): 3 g via INTRAVENOUS
  Filled 2017-01-12 (×8): qty 3

## 2017-01-12 NOTE — Progress Notes (Signed)
CRITICAL VALUE ALERT  Critical Value:  0.03  Date & Time Notied:  01/12/2017 1310  Provider Notified: Dr Alysia PennaErvin  Orders Received/Actions taken: no action

## 2017-01-12 NOTE — Progress Notes (Signed)
CRITICAL VALUE ALERT  Critical Value:  Troponin 0.04  Date & Time Notied: 01/12/17 @ [phone]  Provider Notified: Dr. Vergie LivingPickens   Orders Received/Actions taken: Will notify Dr. Alysia PennaErvin

## 2017-01-12 NOTE — Progress Notes (Signed)
CRITICAL VALUE ALERT  Critical Value: triponin 0.04   Date & Time Notied: 01/12/17 @0049    Provider Notified: Dr. Vergie LivingPickens   Orders Received/Actions taken:Will review and monitor

## 2017-01-12 NOTE — Progress Notes (Signed)
GYN Update Note  Patient Vitals for the past 24 hrs:  BP Temp Temp src Pulse Resp SpO2  01/12/17 0437 119/64 98.1 F (36.7 C) Oral 80 16 97 %  01/12/17 0435 - - - (!) 130 - (!) 79 %  01/12/17 0102 109/63 98.5 F (36.9 C) Oral (!) 114 18 98 %  01/11/17 2338 - - - - - 98 %  01/11/17 2337 116/69 99.1 F (37.3 C) - (!) 138 18 (!) 80 %  01/11/17 2011 (!) 81/59 98.6 F (37 C) Oral 84 18 90 %  01/11/17 1545 112/66 98.4 F (36.9 C) Oral - 20 92 %  01/11/17 1130 111/74 98.6 F (37 C) Oral 100 19 92 %  01/11/17 0805 119/62 98.4 F (36.9 C) Oral 92 20 93 %   Normal O2 sats on 2-3L [place]  Labs Cardiac Panel (last 3 results)  Recent Labs  01/11/17 2359 01/12/17 0536  TROPONINI 0.04* 0.04*    BMP Latest Ref Rng & Units 01/11/2017 01/11/2017 01/11/2017  Glucose 65 - 99 mg/dL 034(V) 425(Z) 563(O)  BUN 6 - 20 mg/dL 19 17 17   Creatinine 0.44 - 1.00 mg/dL 7.56 4.33(I) 9.51(O)  Sodium 135 - 145 mmol/L 139 135 136  Potassium 3.5 - 5.1 mmol/L 4.2 4.5 5.2(H)  Chloride 101 - 111 mmol/L 103 102 105  CO2 22 - 32 mmol/L 29 28 28   Calcium 8.9 - 10.3 mg/dL 84.1 9.8 9.9   CBC Latest Ref Rng & Units 01/11/2017 01/11/2017 01/11/2017  WBC 4.0 - 10.5 K/uL 18.9(H) 20.8(H) 21.9(H)  Hemoglobin 12.0 - 15.0 g/dL 7.9(L) 8.2(L) 9.1(L)  Hematocrit 36.0 - 46.0 % 22.6(L) 23.7(L) 25.7(L)  Platelets 150 - 400 K/uL 219 234 245   Radiology: pCXR-mild bibasilar atelectasis CT PE:  CLINICAL DATA:  Tachycardia and low oxygen saturation. Post hysterectomy 2 days prior.  EXAM: CT ANGIOGRAPHY CHEST WITH CONTRAST  TECHNIQUE: Multidetector CT imaging of the chest was performed using the standard protocol during bolus administration of intravenous contrast. Multiplanar CT image reconstructions and MIPs were obtained to evaluate the vascular anatomy.  CONTRAST:  100 cc Isovue 370 IV  COMPARISON:  Chest radiograph earlier this day  FINDINGS: Cardiovascular: There are no filling defects within the  pulmonary arteries to suggest pulmonary embolus. Normal caliber thoracic aorta without dissection. Common origin of the brachiocephalic and left common carotid artery, normal variant. The heart is normal in size. No pericardial fluid.  Mediastinum/Nodes: No mediastinal or hilar adenopathy. The esophagus is patulous with mild distal esophageal wall thickening. Visualized thyroid gland is normal.  Lungs/Pleura: Dependent right lung base consolidation, greater than expected for atelectasis. Streaky left lower lobe atelectasis. Mild central bronchial thickening. No significant debris in the trachea or proximal bronchi. Mild apical emphysema. No pulmonary edema. Trace right pleural effusion.  Upper Abdomen: Small amount of complex perihepatic fluid. Post splenectomy with complex fluid in the left upper quadrant. No free air in the upper abdomen.  Musculoskeletal: There are no acute or suspicious osseous abnormalities.  Review of the MIP images confirms the above findings.  IMPRESSION: 1. No pulmonary embolus. 2. Dependent right lung base consolidation, greater than that expected for atelectasis. Pneumonia or aspiration could have a similar appearance, aspiration favored given recent surgery. Small right pleural effusion. Streaky opacities in left lower lobe favor atelectasis. 3. Mild emphysema and bronchial thickening. 4. Mild distal esophageal wall thickening caudal reflux or esophagitis. 5. Complex fluid in the upper abdomen is likely postsurgical, but nonspecific.   Electronically Signed  By: Rubye OaksMelanie  Ehinger M.D.   On: 01/12/2017 05:02   A/p: pt stable to improved RN states still de sats on RA but to the low 90s which is an improvement. Will keep on [place] for now Will start unasyn for now for ? Aspiration PNA Will repeat ECG given still slightly elevated troponin D/w Dr. Antoine PocheHochrein and stable troponin likely a consequence of and not etiology of s/s, but he'll take  a look at her ECG and let us know anything concerning  Cornelia Copaharlie Antoino Westhoff, Jr MD Attending Center for [facility] (Faculty Practice) 01/12/2017 Time: [phone]

## 2017-01-12 NOTE — Progress Notes (Signed)
Pharmacy Antibiotic Note  Mary [name] is a 51 y.o. female admitted on 01/10/2017 for surgical  management of uterine hyperplasia.. Pt now with tachycardia and low O2 sats. Chest xray shows possible right lower lung consolidation.  Pharmacy has been consulted for Unasyn dosing for aspiration pneumonia.  Plan: Unasyn 3 Gm IV every 6 hours  Height: 5\' 4"  (162.6 cm) Weight: 177 lb (80.3 kg) IBW/kg (Calculated) : 54.7  Temp (24hrs), Avg:98.5 F (36.9 C), Min:98.1 F (36.7 C), Max:99.1 F (37.3 C)   Recent Labs Lab 01/11/17 0611 01/11/17 0951 01/11/17 2359  WBC 21.9* 20.8* 18.9*  CREATININE 1.07* 1.08* 0.87    Estimated Creatinine Clearance: 77.5 mL/min (by C-G formula based on SCr of 0.87 mg/dL).    No Known Allergies  Antimicrobials this admission: Cefazolin 2 Gm x 1 Pre-op dose 10/31  Dose adjustments this admission: N/A  Microbiology results:  Thank you for allowing pharmacy to be a part of this patient's care.  Arelia SneddonMason, Carlester Kasparek Anne 01/12/2017 7:33 AM

## 2017-01-12 NOTE — Progress Notes (Signed)
2 Days Post-Op Procedure(s) (LRB): HYSTERECTOMY VAGINAL W/ BILATERAL SALPINGECTOMY WITH REPAIR OF INCIDENTAL CYSTOTOMY (N/A) CYSTOSCOPY (N/A) Events of last night noted  Subjective: Patient without complaints presently except wants something to eat. Denies SOB or CP. Abd pain controlled with medication.   Objective: AF VSS  Good UOP Lungs clear with decrease BS bibasilar Heart RRR Abd soft + BS GU no bleeding  Ext Non tender    Assessment:Plan s/p Procedure(s): HYSTERECTOMY VAGINAL W/ BILATERAL SALPINGECTOMY WITH REPAIR OF INCIDENTAL CYSTOTOMY (N/A) CYSTOSCOPY (N/A):  Aspiration Pneumonia  Crt normalized now. CT scan no evidence of bladder leakage or ureteral injury. Suspect events from last night related to aspiration. Cardiology has reviewed and no evidence of acute cardiac events. Will continue with Unasyn. Repeat CXR in AM. Advance diet. Encourage IS use and ambulation.  Events of last and study results reviewed with pt.    Mary StaggersMichael L Lonzie [name] 01/12/2017, 12:31 PM

## 2017-01-12 NOTE — Progress Notes (Addendum)
GYN Update Note  S: pt stable  Patient Vitals for the past 24 hrs:  BP Temp Temp src Pulse Resp SpO2  01/12/17 0102 109/63 98.5 F (36.9 C) Oral (!) 114 18 98 %  01/11/17 2338 - - - - - 98 %  01/11/17 2337 116/69 99.1 F (37.3 C) - (!) 138 18 (!) 80 %  01/11/17 2011 (!) 81/59 98.6 F (37 C) Oral 84 18 90 %  01/11/17 1545 112/66 98.4 F (36.9 C) Oral - 20 92 %  01/11/17 1130 111/74 98.6 F (37 C) Oral 100 19 92 %  01/11/17 0805 119/62 98.4 F (36.9 C) Oral 92 20 93 %  01/11/17 0600 - - - - - (!) 89 %  01/11/17 0410 110/67 98.7 F (37.1 C) Oral 68 20 92 %   Normal O2 sats on 2L [place]  NAD, appears less confused than before  @2359 : troponin I 0.04, Mg 1.7  BMP Latest Ref Rng & Units 01/11/2017 01/11/2017 01/11/2017  Glucose 65 - 99 mg/dL 161(W133(H) 960(A112(H) 540(J130(H)  BUN 6 - 20 mg/dL 19 17 17   Creatinine 0.44 - 1.00 mg/dL 8.110.87 9.14(N1.08(H) 8.29(F1.07(H)  Sodium 135 - 145 mmol/L 139 135 136  Potassium 3.5 - 5.1 mmol/L 4.2 4.5 5.2(H)  Chloride 101 - 111 mmol/L 103 102 105  CO2 22 - 32 mmol/L 29 28 28   Calcium 8.9 - 10.3 mg/dL 62.110.1 9.8 9.9   CBC Latest Ref Rng & Units 01/11/2017 01/11/2017 01/11/2017  WBC 4.0 - 10.5 K/uL 18.9(H) 20.8(H) 21.9(H)  Hemoglobin 12.0 - 15.0 g/dL 7.9(L) 8.2(L) 9.1(L)  Hematocrit 36.0 - 46.0 % 22.6(L) 23.7(L) 25.7(L)  Platelets 150 - 400 K/uL 219 234 245   Pending: pCXR and ECG read\   Awaiting response from cardiology page. Will order d-dimer  Cornelia Copaharlie Cimberly Stoffel, Jr MD Attending Center for [facility](Faculty Practice)   ADDENDUM D/w Dr. Mayford Knifeurner and will go ahead and get a CT PE and repeat labs  Cornelia Copaharlie Maisy Newport, Jr MD Attending Center for [facility] ([facility]) 01/12/2017 Time: [phone]

## 2017-01-12 NOTE — Progress Notes (Signed)
At 2337 with vitals, pt. Noted to be tachycardic and Sp02 into the low 80s/high 70s. O2 6L [place] placed and MD called. Dr. Vergie LivingPickens to the bedside. New orders obtained and completed. Chest X-Ray, 12 lead EKG, labs, and eventual CTA of the chest.  Pt. Was able to answer all orientation questions correctly, however she seemed to be of a confused nature. She remained directable and able to follow all commands with Dr. Vergie LivingPickens assessment. No neurological deficit seen.  Pt. At times told bizarre stories of bus rides, people she had spoken with.  CTA obtained.  Will continue to monitor.

## 2017-01-13 ENCOUNTER — Inpatient Hospital Stay (HOSPITAL_COMMUNITY): Payer: BLUE CROSS/BLUE SHIELD

## 2017-01-13 MED ORDER — CYCLOBENZAPRINE HCL 10 MG PO TABS: 10.0000 mg | ORAL_TABLET | Freq: Three times a day (TID) | ORAL | 0 refills | Status: DC | PRN

## 2017-01-13 MED ORDER — DOCUSATE SODIUM 100 MG PO CAPS: 100.0000 mg | ORAL_CAPSULE | Freq: Two times a day (BID) | ORAL | 0 refills | Status: DC

## 2017-01-13 MED ORDER — PHENAZOPYRIDINE HCL 100 MG PO TABS: 100.0000 mg | ORAL_TABLET | Freq: Three times a day (TID) | ORAL | 0 refills | Status: DC

## 2017-01-13 MED ORDER — IBUPROFEN 800 MG PO TABS: 800.0000 mg | ORAL_TABLET | Freq: Three times a day (TID) | ORAL | 0 refills | Status: DC

## 2017-01-13 MED ORDER — BUTALBITAL-APAP-CAFFEINE 50-325-40 MG PO TABS: 1.0000 | ORAL_TABLET | Freq: Four times a day (QID) | ORAL | Status: DC | PRN

## 2017-01-13 MED ORDER — HYDROCODONE-ACETAMINOPHEN 5-325 MG PO TABS: 1.0000 | ORAL_TABLET | Freq: Four times a day (QID) | ORAL | 0 refills | Status: DC | PRN

## 2017-01-13 MED ORDER — AMOXICILLIN-POT CLAVULANATE 875-125 MG PO TABS: 1.0000 | ORAL_TABLET | Freq: Two times a day (BID) | ORAL | 0 refills | Status: DC

## 2017-01-13 MED ORDER — ONDANSETRON HCL 4 MG PO TABS: 4.0000 mg | ORAL_TABLET | Freq: Four times a day (QID) | ORAL | 0 refills | Status: DC | PRN

## 2017-01-13 MED ORDER — CYCLOBENZAPRINE HCL 10 MG PO TABS
10.0000 mg | ORAL_TABLET | Freq: Three times a day (TID) | ORAL | Status: DC | PRN
  Filled 2017-01-13: qty 1

## 2017-01-13 NOTE — Discharge Summary (Signed)
Physician Discharge Summary  Patient ID: Mary [name] MRN: [id number] DOB/AGE: November 18, 1965 51 y.o.  Admit date: 01/10/2017 Discharge date: 01/13/2017  Admission Diagnoses: Simple Endometrial Hyperplasia without atypia                                      Discharge Diagnoses:  SAA                                          Incidental cystotomy with repair                                          Aspiration Pneumonia   Discharged Condition: good  Hospital Course: Mary [name] was admitted with above DX. Underwent TVH with incidental cystotomy and repair. See OP note for additional information. Mary [name]'s post op course was complicated by episode of confusion and hypoxia POD # 2. Work was demonstrated aspiration pneumonia. She was started on Unasyn. W/U for PE was negative. Cardiology reviewed Mary [name]'s EKG and labs d/t elevated troponin. Determined no acute cardiac event. Radiology imaging revealed bladder without evidence of leaking and patent ureters.   She progressed to ambulating in hallway, + flatus and  tolerating diet.  She did c/o headaches during her hospitalization but reports this has been a chronic problem for her over the last few months. Has been taking Goody powders.  Mary [name] reported unable to obtained medication on day of discharge. Case Manager consult obtained for assistance for discharge meds. Per case manager Mary [name] has [employer] and unable to provide any additional assistance Mary [name] informed of this and she verbalized understanding  Mary [name] was discharge home with follow up and medications as noted.  Significant Diagnostic Studies: labs and radiology  Treatments: IV hydration, antibiotics: Unasyn and surgery: TVH and cystotomy repair  Discharge Exam: Blood pressure 116/60, pulse 80, temperature 98.2 F (36.8 C), temperature source Oral, resp. rate 18, height 5\' 4"  (1.626 m), weight 80.3 kg (177 lb), last menstrual period 11/09/2016, SpO2 96 %.  Lungs clear Heart RRR Abd soft + BS GU foley, no  bleeding Ext non tender  Disposition: 01-Home or Self Care  Discharge Instructions    Call MD for:  difficulty breathing, headache or visual disturbances    Complete by:  As directed    Call MD for:  extreme fatigue    Complete by:  As directed    Call MD for:  persistant dizziness or light-headedness    Complete by:  As directed    Call MD for:  persistant nausea and vomiting    Complete by:  As directed    Call MD for:  severe uncontrolled pain    Complete by:  As directed    Call MD for:  temperature >100.4    Complete by:  As directed    Diet - low sodium heart healthy    Complete by:  As directed    Driving Restrictions    Complete by:  As directed    No driving x 2 weeks   Increase activity slowly    Complete by:  As directed    Sexual Activity Restrictions    Complete by:  As directed    Pelvic rest x 6  weeks     Allergies as of 01/13/2017   No Known Allergies     Medication List    STOP taking these medications   GOODY HEADACHE PO   naproxen 375 MG tablet Commonly known as:  NAPROSYN   naproxen sodium 220 MG tablet Commonly known as:  ALEVE     TAKE these medications   albuterol 108 (90 Base) MCG/ACT inhaler Commonly known as:  PROVENTIL HFA;VENTOLIN HFA Inhale 2 puffs into the lungs every 6 (six) hours as needed for wheezing or shortness of breath.   amoxicillin-clavulanate 875-125 MG tablet Commonly known as:  AUGMENTIN Take 1 tablet by mouth 2 (two) times daily.   cyclobenzaprine 10 MG tablet Commonly known as:  FLEXERIL Take 1 tablet (10 mg total) by mouth 3 (three) times daily as needed for muscle spasms.   docusate sodium 100 MG capsule Commonly known as:  COLACE Take 1 capsule (100 mg total) by mouth 2 (two) times daily.   fluticasone-salmeterol 230-21 MCG/ACT inhaler Commonly known as:  ADVAIR HFA Inhale 2 puffs into the lungs 2 (two) times daily. Rinse mouth after each use   HYDROcodone-acetaminophen 5-325 MG tablet Commonly known  as:  NORCO/VICODIN Take 1-2 tablets by mouth every 6 (six) hours as needed for moderate pain.   ibuprofen 800 MG tablet Commonly known as:  ADVIL,MOTRIN Take 1 tablet (800 mg total) by mouth 3 (three) times daily.   multivitamin with minerals Tabs tablet Take 1 tablet by mouth daily.   ondansetron 4 MG tablet Commonly known as:  ZOFRAN Take 1 tablet (4 mg total) by mouth every 6 (six) hours as needed for nausea.   phenazopyridine 100 MG tablet Commonly known as:  PYRIDIUM Take 1 tablet (100 mg total) by mouth 3 (three) times daily with meals.   QUEtiapine 300 MG tablet Commonly known as:  SEROQUEL Take 300 mg by mouth at bedtime.   ranitidine 300 MG tablet Commonly known as:  ZANTAC Take 1 tablet (300 mg total) by mouth at bedtime.   sertraline 50 MG tablet Commonly known as:  ZOLOFT Take 50 mg by mouth daily.   sucralfate 1 g tablet Commonly known as:  CARAFATE Take 1 tablet (1 g total) by mouth 4 (four) times daily -  with meals and at bedtime.       Mary [name] has been instructed on continue foley use until follow up appt on Tuesday for remove. Routine post op discharge instructions were reviewed with Mary [name] as well. She was instructed to discontinue her Goody Powders and restart her home meds plus additional meds as ordered.   [facility] Information    [facility].   Why:  F/U with Dr. Nettie ElmMichael Lelia Jons on Tuesday November 6 at 9:00 AM for removal of foley Contact information: [address] [place] [phone]          Signed: Hermina StaggersMichael L Eloy Fehl 01/13/2017, 2:44 PM

## 2017-01-16 ENCOUNTER — Encounter: Payer: Self-pay | Admitting: Obstetrics and Gynecology

## 2017-01-16 ENCOUNTER — Ambulatory Visit (INDEPENDENT_AMBULATORY_CARE_PROVIDER_SITE_OTHER): Payer: BLUE CROSS/BLUE SHIELD | Admitting: Obstetrics and Gynecology

## 2017-01-16 VITALS — BP 98/60 | HR 96 | Temp 97.5°F | Ht 64.0 in | Wt 177.0 lb

## 2017-01-16 DIAGNOSIS — Z9889 Other specified postprocedural states: Secondary | ICD-10-CM

## 2017-01-16 MED ORDER — OXYCODONE-ACETAMINOPHEN 5-325 MG PO TABS: 1.0000 | ORAL_TABLET | ORAL | 0 refills | Status: DC | PRN

## 2017-01-16 NOTE — Progress Notes (Addendum)
Post Op   Ms Mary [name] presents today for removal of her foley. Pt underwent TVH 6 days ago. Complicated by incidental cystotomy with repair. She also developed aspiration pneumonia  Today she reports some pain. Appetite improving. Denies any CP, SOB or fevers.   Procedure  Foley was disconnected from bag, 120 cc NS was instilled to bladder and cathter was removed. Pt was able to void 100 cc spontaneously .  A/P Post op  Pt instructed to complete antibiotics for pneumonia. Rx for Percocet for pain as needed. # 20 no refills. Pt instructed to void every 4 hours while a wake. To call office or present to MAU if unable to void. Pt traveling to [place] to recover with family tomorrow. Pt advised on pelvic rest, po intake and to present to local ER for any problems or concerns. She otherwise will follow up with in 3-4 weeks.

## 2017-02-07 ENCOUNTER — Encounter: Payer: Self-pay | Admitting: *Deleted

## 2017-02-14 ENCOUNTER — Encounter: Payer: Self-pay | Admitting: Obstetrics and Gynecology

## 2017-02-14 ENCOUNTER — Ambulatory Visit (INDEPENDENT_AMBULATORY_CARE_PROVIDER_SITE_OTHER): Payer: BLUE CROSS/BLUE SHIELD | Admitting: Obstetrics and Gynecology

## 2017-02-14 VITALS — BP 130/85 | HR 74 | Wt 170.6 lb

## 2017-02-14 DIAGNOSIS — Z9889 Other specified postprocedural states: Secondary | ICD-10-CM

## 2017-02-14 MED ORDER — ONDANSETRON HCL 4 MG PO TABS: 4.0000 mg | ORAL_TABLET | Freq: Four times a day (QID) | ORAL | 0 refills | Status: DC | PRN

## 2017-02-14 MED ORDER — IBUPROFEN 800 MG PO TABS: 800.0000 mg | ORAL_TABLET | Freq: Three times a day (TID) | ORAL | 0 refills | Status: DC

## 2017-02-14 NOTE — Patient Instructions (Signed)

## 2017-02-14 NOTE — Progress Notes (Signed)
Ms Mary [name] is here for post op appt.  She is s/p TVH with repair of incidental cystomy She is doing well. Occ pelvic pain, some vaginal discharge Denies any bowel or bladder dysfunction. Still with some nausea but this is chronic.  Pathology reviewed with pt.  PE AF VSS Lungs clear Heart RRR Abd soft + BS GU cuff healing well, scant discharge no odor sutures in place no tenderness or masses  A/P Post OP   Pt to continue to increase ADL's as tolerates. Return to work note provided without restrictions Instructed to reframe from IC x 4 weeks. Motrin and Zofran refilled. Pt instructed if continues to have problems with nausea she will need to see her PCP. She o/w with follow up in 1 yr for yearly GYN exam or PRN

## 2017-04-04 DIAGNOSIS — F3132 Bipolar disorder, current episode depressed, moderate: Secondary | ICD-10-CM | POA: Diagnosis not present

## 2017-04-23 IMAGING — CT CT RENAL STONE PROTOCOL
2 of 4 series · 15 of 46 positions shown, 17 images · non-contrast
Comparison: CT of the abdomen and pelvis from 02/10/2013

CLINICAL DATA: Acute onset of right lower quadrant abdominal pain.
Initial encounter.

EXAM:
CT ABDOMEN AND PELVIS WITHOUT CONTRAST
TECHNIQUE: Multidetector CT imaging of the abdomen and pelvis was performed
following the standard protocol without IV contrast.

[Series 3: stone study 5.0 i30f 2 · axial · 0.68mm/px · z∈[-976,-551]mm · 12 of 93 slices shown, 14 images]
[im 4/93  soft-tissue]
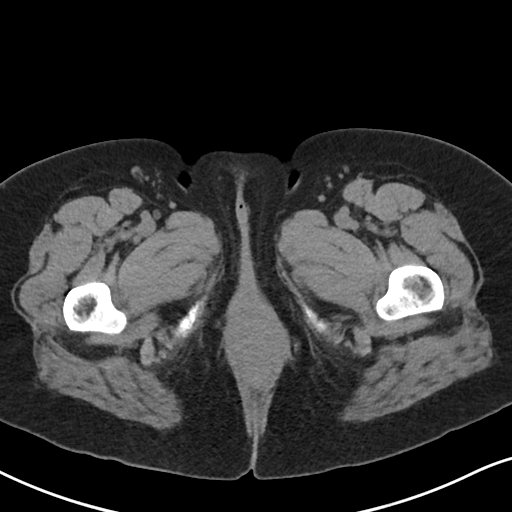
[im 4/93  bone]
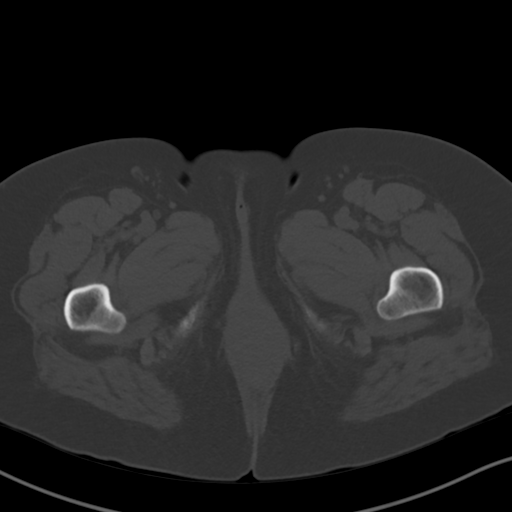
[im 12/93  soft-tissue]
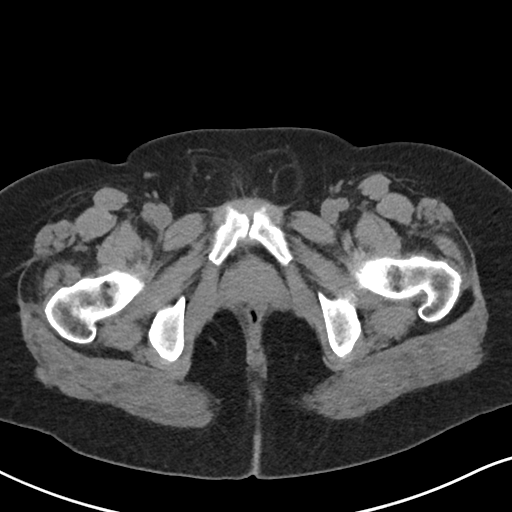
[im 20/93  soft-tissue]
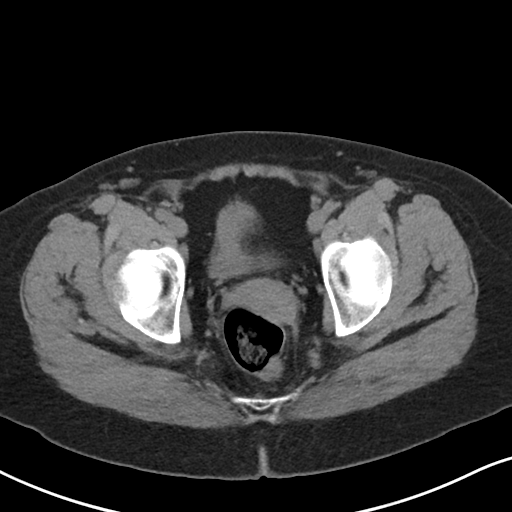
[im 27/93  soft-tissue]
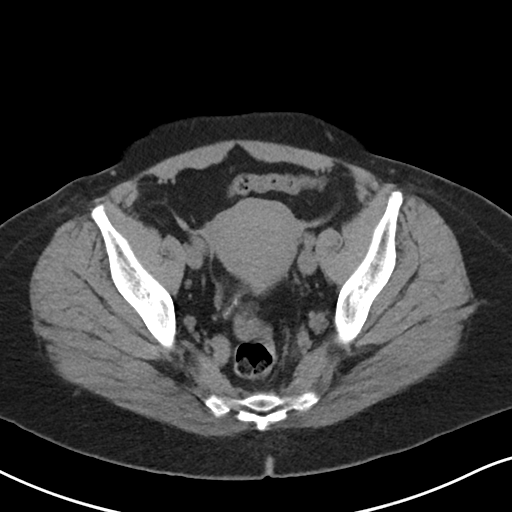
[im 35/93  soft-tissue]
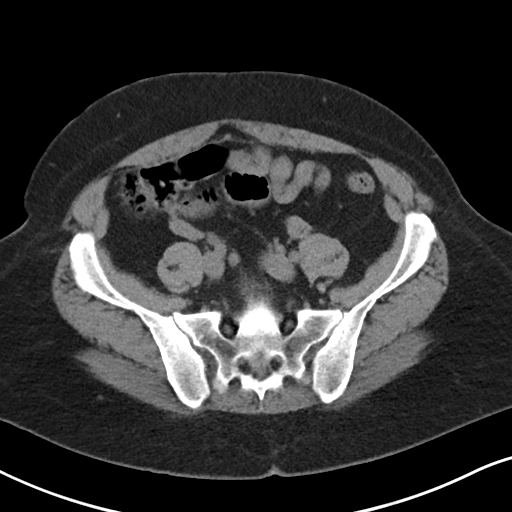
[im 43/93  soft-tissue]
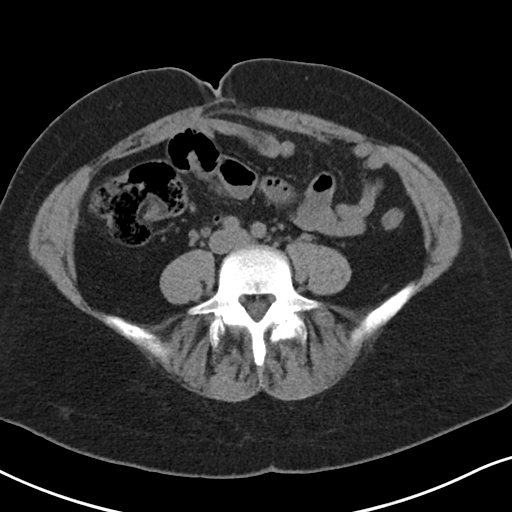
[im 50/93  soft-tissue]
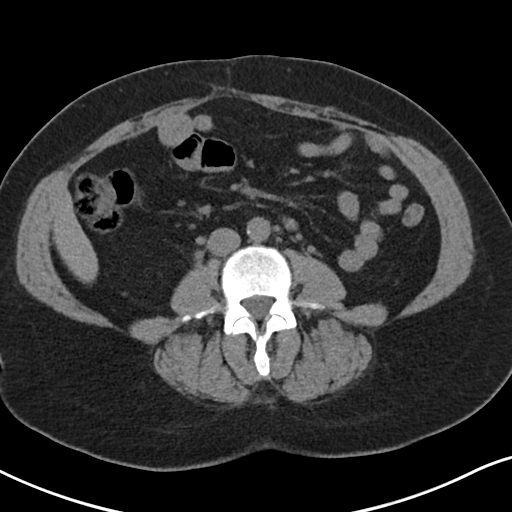
[im 58/93  soft-tissue]
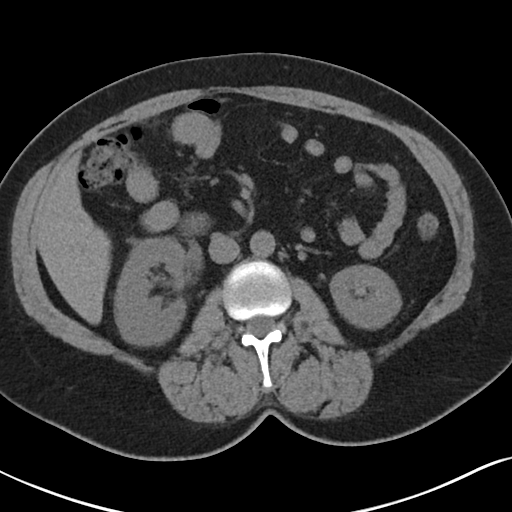
[im 66/93  soft-tissue]
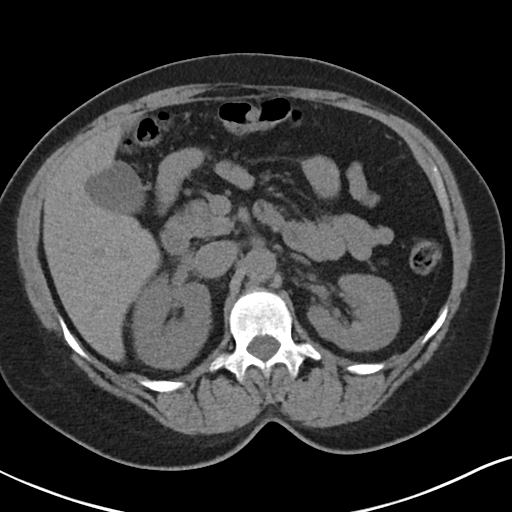
[im 66/93  bone]
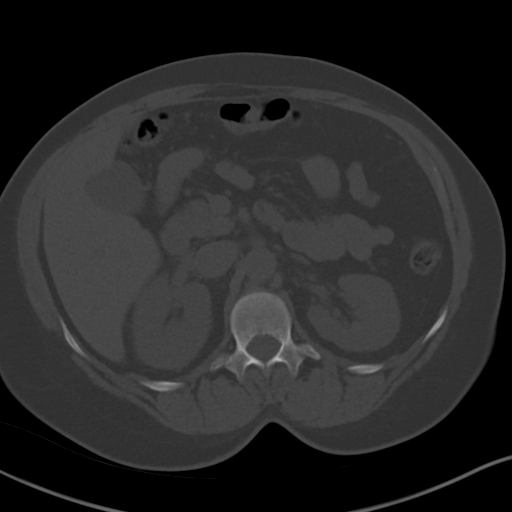
[im 73/93  soft-tissue]
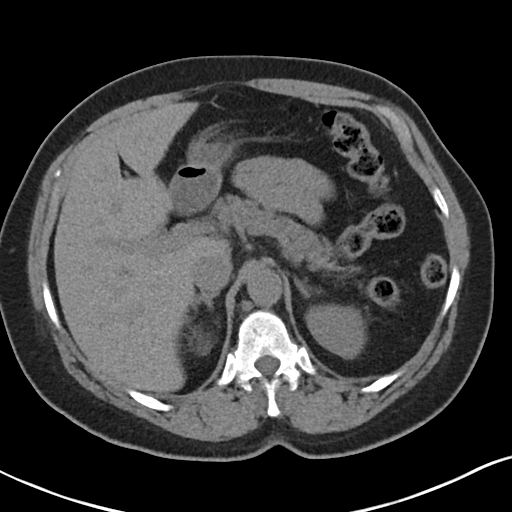
[im 81/93  soft-tissue]
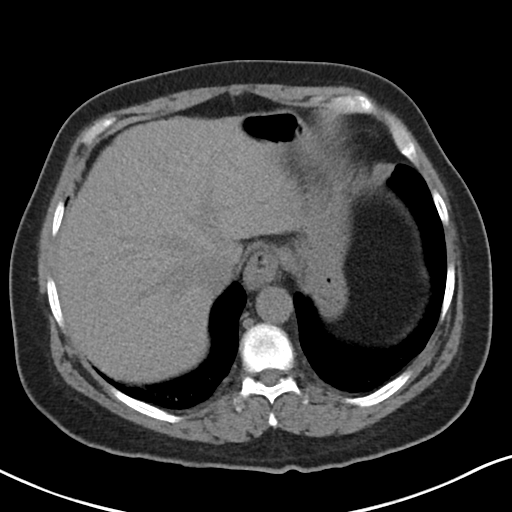
[im 89/93  soft-tissue]
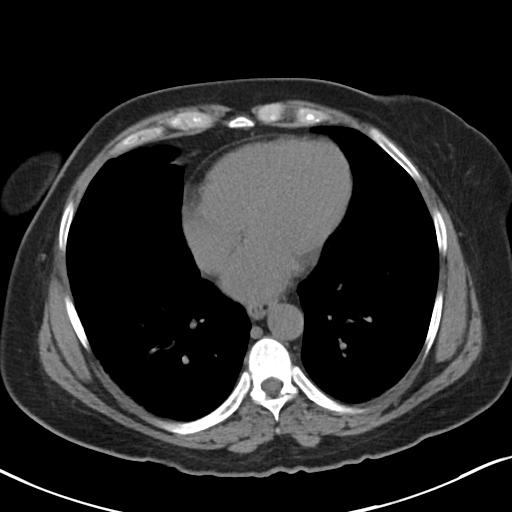

[Series 6: coronal soft tissue · coronal · 0.71mm/px · 3 of 84 slices shown]
[im 28/84  soft-tissue]
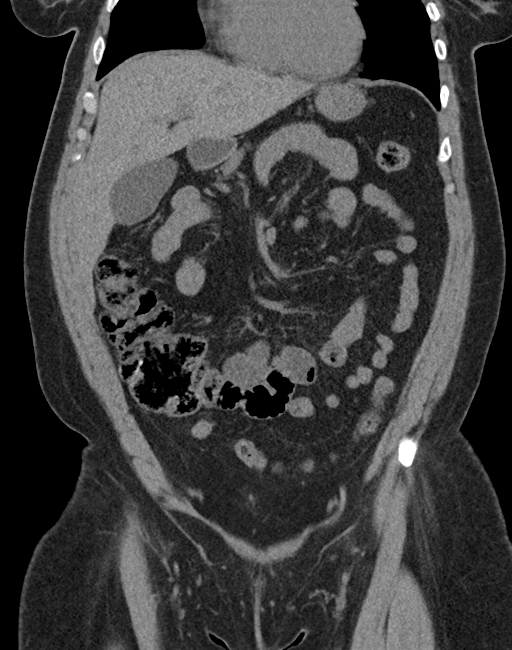
[im 37/84  soft-tissue]
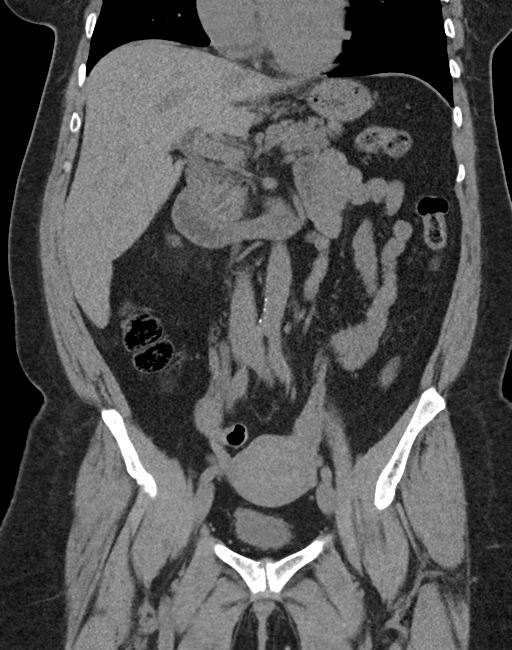
[im 47/84  soft-tissue]
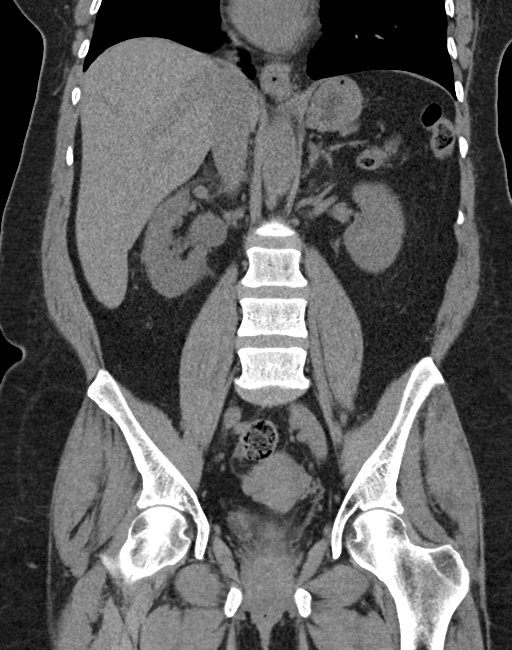

[15 of 46 positions shown; findings below may reference images not displayed]

FINDINGS: Lower chest: Minimal bibasilar atelectasis is noted. The visualized
portions of the mediastinum are unremarkable.

Hepatobiliary: The liver is unremarkable in appearance. The
gallbladder is unremarkable in appearance. The common bile duct
remains normal in caliber.

Pancreas: The pancreas is within normal limits.

Spleen: The patient is status post splenectomy.

Adrenals/Urinary Tract: The adrenal glands are unremarkable in
appearance.

There is mild right-sided hydronephrosis, with prominence of the
right ureter along its entire course. An obstructing 7 x 4 mm stone
is noted at the distal right ureter, 3-4 cm above the right
vesicoureteral junction. Scattered right-sided perinephric stranding
is noted.

No nonobstructing renal stones are identified. The left kidney is
unremarkable in appearance.

Stomach/Bowel: The stomach is unremarkable in appearance. The small
bowel is within normal limits. The appendix is normal in caliber,
without evidence of appendicitis. The colon is unremarkable in
appearance.

Vascular/Lymphatic: Minimal calcification is seen at the distal
abdominal aorta. No retroperitoneal or pelvic sidewall
lymphadenopathy is seen.

Reproductive: The bladder is relatively decompressed and grossly
unremarkable in appearance. The uterus is unremarkable in
appearance. The ovaries are relatively symmetric. No suspicious
adnexal masses are seen.

Other: No additional soft tissue abnormalities are seen.

Musculoskeletal: No acute osseous abnormalities are identified.
There is grade 2 anterolisthesis of L5 on S1, reflecting underlying
facet disease. Associated vacuum phenomenon is noted. The visualized
musculature is unremarkable in appearance.
IMPRESSION: 1. Mild right-sided hydronephrosis, with an obstructing 7 x 4 mm
stone noted at the distal right ureter, 3-4 cm above the right
vesicoureteral junction.
2. Grade 2 anterolisthesis of L5 on S1, reflecting underlying facet
disease.

## 2017-10-12 ENCOUNTER — Inpatient Hospital Stay (HOSPITAL_COMMUNITY)
Admission: AD | Admit: 2017-10-12 | Discharge: 2017-10-12 | Disposition: A | Discharge status: Home / Self Care | Payer: BLUE CROSS/BLUE SHIELD | Source: Ambulatory Visit | Attending: Family Medicine | Admitting: Family Medicine

## 2017-10-12 DIAGNOSIS — N390 Urinary tract infection, site not specified: Secondary | ICD-10-CM | POA: Insufficient documentation

## 2017-10-12 DIAGNOSIS — F17210 Nicotine dependence, cigarettes, uncomplicated: Secondary | ICD-10-CM | POA: Diagnosis not present

## 2017-10-12 DIAGNOSIS — Z915 Personal history of self-harm: Secondary | ICD-10-CM | POA: Insufficient documentation

## 2017-10-12 DIAGNOSIS — E559 Vitamin D deficiency, unspecified: Secondary | ICD-10-CM | POA: Diagnosis not present

## 2017-10-12 DIAGNOSIS — Z9081 Acquired absence of spleen: Secondary | ICD-10-CM | POA: Insufficient documentation

## 2017-10-12 DIAGNOSIS — J449 Chronic obstructive pulmonary disease, unspecified: Secondary | ICD-10-CM | POA: Insufficient documentation

## 2017-10-12 DIAGNOSIS — F4310 Post-traumatic stress disorder, unspecified: Secondary | ICD-10-CM | POA: Diagnosis not present

## 2017-10-12 DIAGNOSIS — E210 Primary hyperparathyroidism: Secondary | ICD-10-CM | POA: Insufficient documentation

## 2017-10-12 DIAGNOSIS — Z8619 Personal history of other infectious and parasitic diseases: Secondary | ICD-10-CM | POA: Insufficient documentation

## 2017-10-12 DIAGNOSIS — K219 Gastro-esophageal reflux disease without esophagitis: Secondary | ICD-10-CM | POA: Diagnosis not present

## 2017-10-12 DIAGNOSIS — Z79899 Other long term (current) drug therapy: Secondary | ICD-10-CM | POA: Insufficient documentation

## 2017-10-12 DIAGNOSIS — F209 Schizophrenia, unspecified: Secondary | ICD-10-CM | POA: Diagnosis not present

## 2017-10-12 DIAGNOSIS — R300 Dysuria: Secondary | ICD-10-CM | POA: Diagnosis present

## 2017-10-12 DIAGNOSIS — Z8249 Family history of ischemic heart disease and other diseases of the circulatory system: Secondary | ICD-10-CM | POA: Diagnosis not present

## 2017-10-12 DIAGNOSIS — Z90710 Acquired absence of both cervix and uterus: Secondary | ICD-10-CM | POA: Insufficient documentation

## 2017-10-12 DIAGNOSIS — F603 Borderline personality disorder: Secondary | ICD-10-CM | POA: Insufficient documentation

## 2017-10-12 DIAGNOSIS — Z87442 Personal history of urinary calculi: Secondary | ICD-10-CM | POA: Diagnosis not present

## 2017-10-12 DIAGNOSIS — F319 Bipolar disorder, unspecified: Secondary | ICD-10-CM | POA: Insufficient documentation

## 2017-10-12 LAB — URINALYSIS, ROUTINE W REFLEX MICROSCOPIC
BILIRUBIN URINE: NEGATIVE
Glucose, UA: 50 mg/dL — AB
KETONES UR: NEGATIVE mg/dL
Nitrite: POSITIVE — AB
PROTEIN: NEGATIVE mg/dL
Specific Gravity, Urine: 1.019 (ref 1.005–1.030)
pH: 6 (ref 5.0–8.0)

## 2017-10-12 MED ORDER — PHENAZOPYRIDINE HCL 200 MG PO TABS: 200.0000 mg | ORAL_TABLET | Freq: Three times a day (TID) | ORAL | 0 refills | Status: DC

## 2017-10-12 MED ORDER — SULFAMETHOXAZOLE-TRIMETHOPRIM 800-160 MG PO TABS: 1.0000 | ORAL_TABLET | Freq: Two times a day (BID) | ORAL | 0 refills | Status: AC

## 2017-10-12 NOTE — MAU Provider Note (Signed)
History     CSN: [id number]  Arrival date and time: 10/12/17 1356   None     Chief Complaint  Patient presents with  . Dysuria   Mary [name] is a 52 y.o. female G5P0 here in MAU with dysuria.   Dysuria   This is a new problem. The current episode started yesterday. The problem occurs every urination. The problem has been unchanged. The quality of the pain is described as burning. The pain is at a severity of 7/10. The pain is moderate. There has been no fever. She is sexually active. There is no history of pyelonephritis. Pertinent negatives include no chills, discharge or flank pain. She has tried nothing for the symptoms.    OB History    Gravida  5   Para      Term      Preterm      AB      Living  5     SAB      TAB      Ectopic      Multiple      Live Births  5           Past Medical History:  Diagnosis Date  . Bipolar depression (HCC)   . Borderline personality disorder (HCC)   . COPD (chronic obstructive pulmonary disease) (HCC)   . GERD (gastroesophageal reflux disease)   . Hiatal hernia   . History of alcohol abuse    per pt none since 10/ 2014  . History of attempted suicide   . History of drug abuse in remission    per pt in remission since 12-26-2012  polysubstance dependence (alcohol, crack, cocaine, 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  . History of hepatitis C per pt first dx 2009 (approx)   consulted w/ dr comer (infectious disease) note 04-26-2016 , pt states was called and told per lab work no longer has hepatitis c   . History of kidney stones   . Hypercalcemia    mild and long-standing--- pt asymptomatic  . Primary hyperparathyroidism [facility])    endocrinology --  dr Lucianne Muss  . Pruritic erythematous rash    lower legs  . PTSD (post-traumatic stress disorder)   . Right ureteral stone   . Schizophrenia (HCC)   . Vitamin D deficiency     Past Surgical History:  Procedure Laterality Date  . CYSTOSCOPY N/A 01/10/2017    Procedure: CYSTOSCOPY;  Surgeon: Hermina Staggers, MD;  Location: [facility];  Service: Gynecology;  Laterality: N/A;  . CYSTOSCOPY W/ URETERAL STENT PLACEMENT Right 07/14/2016   Procedure: CYSTOSCOPY WITH STENT REPLACEMENT;  Surgeon: Malen Gauze, MD;  Location[facility];  Service: Urology;  Laterality: Right;  . CYSTOSCOPY WITH RETROGRADE PYELOGRAM, URETEROSCOPY AND STENT PLACEMENT Right 06/28/2016   Procedure: CYSTOSCOPY WITH RETROGRADE PYELOGRAM, URETEROSCOPY AND STENT PLACEMENT;  Surgeon: Malen Gauze, MD;  Location: [facility];  Service: Urology;  Laterality: Right;  . CYSTOSCOPY/RETROGRADE/URETEROSCOPY/STONE EXTRACTION WITH BASKET Right 07/14/2016   Procedure: CYSTOSCOPY/RETROGRADE/URETEROSCOPY/STONE EXTRACTION WITH BASKET;  Surgeon: Malen Gauze, MD;  Location[facility];  Service: Urology;  Laterality: Right;  . DIAGNOSTIC LAPAROSCOPIC LIVER BIOPSY N/A 02/10/2013   Procedure: DIAGNOSTIC LAPAROSCOPIC ;  Surgeon: Ardeth Sportsman, MD;  Location: [facility];  Service: General;  Laterality: N/A;  DIAGNOSTIC LAPAROSCOPY,laparoscopic ventral hernia repair with mesh lysis of adhesions  . HERNIA REPAIR    . HOLMIUM LASER APPLICATION Right 07/14/2016   Procedure: HOLMIUM  LASER APPLICATION;  Surgeon: Malen Gauze, MD;  Location: [facility];  Service: Urology;  Laterality: Right;  . SPLENECTOMY  2004  . TUBAL LIGATION    . VAGINAL HYSTERECTOMY N/A 01/10/2017   Procedure: HYSTERECTOMY VAGINAL W/ BILATERAL SALPINGECTOMY WITH REPAIR OF INCIDENTAL CYSTOTOMY;  Surgeon: Hermina Staggers, MD;  Location: [facility];  Service: Gynecology;  Laterality: N/A;    Family History  Problem Relation Age of Onset  . Alcohol abuse Mother   . Alcohol abuse Father   . Cancer Maternal Grandmother        colon  . Diabetes Maternal Grandmother   . Hypertension Maternal Grandmother   . Alcohol abuse Sister   . Thyroid disease Neg Hx     Social History    Tobacco Use  . Smoking status: Current Every Day Smoker    Packs/day: 1.00    Years: 32.00    Pack years: 32.00    Types: Cigarettes  . Smokeless tobacco: Never Used  Substance Use Topics  . Alcohol use: No    Comment: quit 12/2012  . Drug use: No    Types: "Crack" cocaine, Marijuana, Cocaine    Comment: per pt quit 12-26-2012    Allergies: No Known Allergies  Medications Prior to Admission  Medication Sig Dispense Refill Last Dose  . albuterol (PROVENTIL HFA;VENTOLIN HFA) 108 (90 Base) MCG/ACT inhaler Inhale 2 puffs into the lungs every 6 (six) hours as needed for wheezing or shortness of breath. (Patient not taking: Reported on 02/14/2017) 1 Inhaler 1 Not Taking  . amoxicillin-clavulanate (AUGMENTIN) 875-125 MG tablet Take 1 tablet by mouth 2 (two) times daily. (Patient not taking: Reported on 02/14/2017) 10 tablet 0 Not Taking  . cyclobenzaprine (FLEXERIL) 10 MG tablet Take 1 tablet (10 mg total) by mouth 3 (three) times daily as needed for muscle spasms. (Patient not taking: Reported on 02/14/2017) 30 tablet 0 Not Taking  . docusate sodium (COLACE) 100 MG capsule Take 1 capsule (100 mg total) by mouth 2 (two) times daily. (Patient not taking: Reported on 02/14/2017) 10 capsule 0 Not Taking  . fluticasone-salmeterol (ADVAIR HFA) 230-21 MCG/ACT inhaler Inhale 2 puffs into the lungs 2 (two) times daily. Rinse mouth after each use (Patient not taking: Reported on 02/14/2017) 1 Inhaler 2 Not Taking  . HYDROcodone-acetaminophen (NORCO/VICODIN) 5-325 MG tablet Take 1-2 tablets by mouth every 6 (six) hours as needed for moderate pain. (Patient not taking: Reported on 01/16/2017) 24 tablet 0 Not Taking  . ibuprofen (ADVIL,MOTRIN) 800 MG tablet Take 1 tablet (800 mg total) by mouth 3 (three) times daily. 30 tablet 0   . Multiple Vitamin (MULTIVITAMIN WITH MINERALS) TABS tablet Take 1 tablet by mouth daily.   Not Taking  . ondansetron (ZOFRAN) 4 MG tablet Take 1 tablet (4 mg total) by mouth every 6  (six) hours as needed for nausea. 20 tablet 0   . oxyCODONE-acetaminophen (PERCOCET/ROXICET) 5-325 MG tablet Take 1 tablet every 4 (four) hours as needed by mouth for severe pain. (Patient not taking: Reported on 02/14/2017) 20 tablet 0 Not Taking  . phenazopyridine (PYRIDIUM) 100 MG tablet Take 1 tablet (100 mg total) by mouth 3 (three) times daily with meals. (Patient not taking: Reported on 02/14/2017) 10 tablet 0 Not Taking  . QUEtiapine (SEROQUEL) 300 MG tablet Take 300 mg by mouth at bedtime.   Taking  . ranitidine (ZANTAC) 300 MG tablet Take 1 tablet (300 mg total) by mouth at bedtime. (Patient not taking: Reported on 02/14/2017)  30 tablet 1 Not Taking  . sertraline (ZOLOFT) 50 MG tablet Take 50 mg by mouth daily.    Taking  . sucralfate (CARAFATE) 1 g tablet Take 1 tablet (1 g total) by mouth 4 (four) times daily -  with meals and at bedtime. (Patient not taking: Reported on 02/14/2017) 40 tablet 0 Not Taking   Results for orders placed or performed during the hospital encounter of 10/12/17 (from the past 48 hour(s))  Urinalysis, Routine w reflex microscopic     Status: Abnormal   Collection Time: 10/12/17  3:04 PM  Result Value Ref Range   Color, Urine AMBER (A) YELLOW    Comment: BIOCHEMICALS MAY BE AFFECTED BY COLOR   APPearance CLOUDY (A) CLEAR   Specific Gravity, Urine 1.019 1.005 - 1.030   pH 6.0 5.0 - 8.0   Glucose, UA 50 (A) NEGATIVE mg/dL   Hgb urine dipstick SMALL (A) NEGATIVE   Bilirubin Urine NEGATIVE NEGATIVE   Ketones, ur NEGATIVE NEGATIVE mg/dL   Protein, ur NEGATIVE NEGATIVE mg/dL   Nitrite POSITIVE (A) NEGATIVE   Leukocytes, UA TRACE (A) NEGATIVE   RBC / HPF 0-5 0 - 5 RBC/hpf   WBC, UA 21-50 0 - 5 WBC/hpf   Bacteria, UA MANY (A) NONE SEEN   Squamous Epithelial / LPF 21-50 0 - 5   Mucus PRESENT     Comment: Performed at [facility], [address] [place], [place] [id number]   Review of Systems  Constitutional: Negative for chills.  Genitourinary: Positive  for dysuria. Negative for flank pain.   Physical Exam   Blood pressure 119/78, pulse 88, temperature 98.3 F (36.8 C), temperature source Oral, resp. rate 18, height 5' 4.5" (1.638 m), weight 174 lb (78.9 kg), SpO2 100 %.  Physical Exam  Constitutional: She is oriented to person, place, and time. She appears well-developed and well-nourished. No distress.  HENT:  Head: Normocephalic.  Neurological: She is alert and oriented to person, place, and time.  Skin: Skin is warm. She is not diaphoretic.  Psychiatric: Her behavior is normal.   MAU Course  Procedures  None  MDM  UA  Assessment and Plan   A:  1. Acute UTI     P:  Discharge home in stable condition Rx: Bactrim, pyridium Return to MAU for emergencies only  Currie Dennin, Harolyn RutherfordJennifer I, NP 10/12/2017 7:13 PM

## 2017-10-12 NOTE — Discharge Instructions (Signed)
Bacteremia °Bacteremia is the presence of bacteria in the blood. When bacteria enter the bloodstream, they can cause a life-threatening reaction called sepsis, which is a medical emergency. Bacteremia can spread to other parts of the body, including the heart, joints, and brain. °What are the causes? °This condition is caused by bacteria that get into the blood. Bacteria can enter the blood: °· From a skin infection or a cut on your skin. °· During an episode of pneumonia. °· From an infection in your stomach or intestine (gastrointestinal infection). °· From an infection in your bladder or urinary system (urinary tract infection). °· During a dental or medical procedure. °· After you brush your teeth so hard that your gums bleed. °· When a bacterial infection in another part of the body spreads to the blood. °· Through a dirty needle. ° °What increases the risk? °This condition is more likely to develop in: °· Children. °· Elderly adults. °· People who have a long-lasting (chronic) disease or medical condition. °· People who have an artificial joint or heart valve. °· People who have heart valve disease. °· People who have a tube, such as a catheter or IV tube, that has been inserted for a medical treatment. °· People who have a weak body defense system (immune system). °· People who use IV drugs. ° °What are the signs or symptoms? °Symptoms of this condition include: °· Fever. °· Chills. °· A racing heart. °· Shortness of breath. °· Dizziness. °· Weakness. °· Confusion. °· Nausea or vomiting. °· Diarrhea. ° °In some cases, there are no symptoms. Bacteremia that has spread to the other parts of the body may cause symptoms in those areas. °How is this diagnosed? °This condition may be diagnosed with a physical exam and tests, such as: °· A complete blood count (CBC). This test looks for signs of infection. °· Blood cultures. These look for bacteria in your blood. °· Tests of any tubes that you may have inserted into  your body, such as an IV tube or urinary catheter. These tests look for a source of infection. °· Urine tests including urine cultures. These look for bacteria in the urine that could be a source of infection. °· Imaging tests, such as an X-ray, CT scan, MRI, or heart ultrasound. These look for a source of infection in other parts of the body, such as the lungs, heart valves, or joints. ° °How is this treated? °This condition may be treated with: °· Antibiotic medicines given through an IV infusion. Depending on the source of infection, antibiotics may be needed for several weeks. At first, an antibiotic may be given to kill most types of blood bacteria. If your test results show that a certain kind of bacteria is causing problems, the antibiotic may be changed to kill only the bacteria that are causing problems. °· Antibiotics taken by mouth. °· IV fluids to support the body as you fight the infection. °· Removing any catheter or device that could be a source of infection. °· Blood pressure and breathing support, if you have sepsis. °· Surgery to control the source or spread of infection, such as: °? Removing an infected implanted device. °? Removing infected tissue or an abscess. ° °This condition is usually treated at a hospital. If you are treated at home, you may need to come back for medicines, blood tests, and evaluation. This is important. °Follow these instructions at home: °· Take over-the-counter and prescription medicines only as told by your health care provider. °·   If you were prescribed an antibiotic, take it as told by your health care provider. Do not stop taking the antibiotic even if you start to feel better. °· Rest until your condition is under control. °· Drink enough fluid to keep your urine clear or pale yellow. °· Do not smoke. If you need help quitting, ask your health care provider. °· Keep all follow-up visits as told by your health care provider. This is important. °How is this  prevented? °· Get the vaccinations that your health care provider recommends. °· Clean and cover any scrapes or cuts. °· Take good care of your skin. This includes regular bathing and moisturizing. °· Wash your hands often. °· Practice good oral hygiene. Brush your teeth two times a day and floss regularly. °Get help right away if: °· You have pain. °· You have a fever. °· You have trouble breathing. °· Your skin becomes blotchy, pale, or clammy. °· You develop confusion, dizziness, or weakness. °· You develop diarrhea. °· You develop any new symptoms after treatment. °Summary °· Bacteremia is the presence of bacteria in the blood. When bacteria enter the bloodstream, they can cause a life- threatening reaction called sepsis. °· Children and elderly adults are at increased risk of bacteremia. Other risk factors include having a long-lasting (chronic) disease or a weak immune system, having an artificial joint or heart valve, having heart valve disease, having tubes that were inserted in the body for medical treatment, or using IV drugs. °· Some symptoms of bacteremia include fever, chills, shortness of breath, confusion, nausea or vomiting, and diarrhea. °· Tests may be done to diagnose a source of infection that led to bacteremia. These tests may include blood tests, urine tests, and imaging tests. °· Bacteremia is usually treated with antibiotics, usually in a hospital. °This information is not intended to replace advice given to you by your health care provider. Make sure you discuss any questions you have with your health care provider. °Document Released: 12/11/2005 Document Revised: 01/25/2016 Document Reviewed: 01/25/2016 °Elsevier Interactive Patient Education © 2018 [employer] ° °

## 2017-10-12 NOTE — MAU Note (Signed)
Pt reports urgency, frequency, voiding small amounts, and pain with urination for 2 days.

## 2017-11-16 DIAGNOSIS — F609 Personality disorder, unspecified: Secondary | ICD-10-CM | POA: Insufficient documentation

## 2017-11-16 DIAGNOSIS — N898 Other specified noninflammatory disorders of vagina: Secondary | ICD-10-CM | POA: Diagnosis not present

## 2017-11-16 DIAGNOSIS — N390 Urinary tract infection, site not specified: Secondary | ICD-10-CM | POA: Diagnosis not present

## 2018-07-24 DIAGNOSIS — F17200 Nicotine dependence, unspecified, uncomplicated: Secondary | ICD-10-CM | POA: Insufficient documentation

## 2018-07-24 DIAGNOSIS — F1020 Alcohol dependence, uncomplicated: Secondary | ICD-10-CM | POA: Insufficient documentation

## 2018-07-24 DIAGNOSIS — F315 Bipolar disorder, current episode depressed, severe, with psychotic features: Secondary | ICD-10-CM | POA: Insufficient documentation

## 2018-07-24 DIAGNOSIS — F1421 Cocaine dependence, in remission: Secondary | ICD-10-CM | POA: Insufficient documentation

## 2018-07-24 DIAGNOSIS — F1220 Cannabis dependence, uncomplicated: Secondary | ICD-10-CM | POA: Insufficient documentation

## 2018-07-24 DIAGNOSIS — F4310 Post-traumatic stress disorder, unspecified: Secondary | ICD-10-CM | POA: Insufficient documentation

## 2019-03-17 ENCOUNTER — Ambulatory Visit (HOSPITAL_COMMUNITY)
Admission: EM | Admit: 2019-03-17 | Discharge: 2019-03-17 | Disposition: A | Discharge status: Home / Self Care | Payer: BLUE CROSS/BLUE SHIELD | Attending: Family Medicine | Admitting: Family Medicine

## 2019-03-17 ENCOUNTER — Encounter (HOSPITAL_COMMUNITY): Payer: Self-pay

## 2019-03-17 ENCOUNTER — Other Ambulatory Visit: Payer: Self-pay

## 2019-03-17 DIAGNOSIS — N898 Other specified noninflammatory disorders of vagina: Secondary | ICD-10-CM

## 2019-03-17 DIAGNOSIS — Z113 Encounter for screening for infections with a predominantly sexual mode of transmission: Secondary | ICD-10-CM | POA: Insufficient documentation

## 2019-03-17 LAB — POCT URINALYSIS DIP (DEVICE)
Glucose, UA: NEGATIVE mg/dL
Hgb urine dipstick: NEGATIVE
Leukocytes,Ua: NEGATIVE
Nitrite: NEGATIVE
Protein, ur: NEGATIVE mg/dL
Specific Gravity, Urine: >=1.03 (ref 1.005–1.030)
Urobilinogen, UA: 1 mg/dL (ref 0.0–1.0)
pH: 6 (ref 5.0–8.0)

## 2019-03-17 NOTE — ED Provider Notes (Signed)
[facility]    CSN: [id number] Arrival date & time: 03/17/19  1255      History   Chief Complaint Chief Complaint  Patient presents with  . Exposure to STD  . Vaginal Discharge  . Urinary Frequency    HPI Mary [name] is a 54 y.o. female.   Mary [name] presents with complaints of vaginal discharge with odor. Noted it approximately 5 days ago. Denies any urinary symptoms. No pelvic or back pain. She is sexually active with 1 partner, doesn't use condoms. No vaginal bleeding. History  Of hysterectomy. 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 States she had been abstinent for a period of time and recently started having intercourse again. History  Of copd, bipolar, druge abuse in remission, hep c.     ROS per HPI, negative if not otherwise mentioned.      Past Medical History:  Diagnosis Date  . Bipolar depression (HCC)   . Borderline personality disorder (HCC)   . COPD (chronic obstructive pulmonary disease) (HCC)   . GERD (gastroesophageal reflux disease)   . Hiatal hernia   . History of alcohol abuse    per pt none since 10/ 2014  . History of attempted suicide   . History of drug abuse in remission (HCC)    per pt in remission since 12-26-2012  polysubstance dependence (alcohol, crack, cocaine, 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  . History of hepatitis C per pt first dx 2009 (approx)   consulted w/ dr comer (infectious disease) note 04-26-2016 , pt states was called and told per lab work no longer has hepatitis c   . History of kidney stones   . Hypercalcemia    mild and long-standing--- pt asymptomatic  . Primary hyperparathyroidism [facility])    endocrinology --  dr Lucianne Muss  . Pruritic erythematous rash    lower legs  . PTSD (post-traumatic stress disorder)   . Right ureteral stone   . Schizophrenia (HCC)   . Vitamin D deficiency     Patient Active Problem List   Diagnosis Date Noted  .  Post-operative state 01/10/2017  . Ureteral calculus 06/28/2016  . Pruritic erythematous rash 04/12/2016  . Reactive airway disease that is not asthma 04/12/2016  . Chronic hepatitis C without hepatic coma (HCC)   . Drug abuse and dependence (HCC)   . Incarcerated incisional hernia s/p lap repair 02/10/2013 02/10/2013  . Polysubstance dependence (HCC) 10/16/2011  . Substance induced mood disorder (HCC) 10/16/2011  . GERD without esophagitis 07/04/2011    Past Surgical History:  Procedure Laterality Date  . CYSTOSCOPY N/A 01/10/2017   Procedure: CYSTOSCOPY;  Surgeon: Hermina Staggers, MD;  Location: [facility];  Service: Gynecology;  Laterality: N/A;  . CYSTOSCOPY W/ URETERAL STENT PLACEMENT Right 07/14/2016   Procedure: CYSTOSCOPY WITH STENT REPLACEMENT;  Surgeon: Malen Gauze, MD;  Location: [facility];  Service: Urology;  Laterality: Right;  . CYSTOSCOPY WITH RETROGRADE PYELOGRAM, URETEROSCOPY AND STENT PLACEMENT Right 06/28/2016   Procedure: CYSTOSCOPY WITH RETROGRADE PYELOGRAM, URETEROSCOPY AND STENT PLACEMENT;  Surgeon: Malen Gauze, MD;  Location: [facility];  Service: Urology;  Laterality: Right;  . CYSTOSCOPY/RETROGRADE/URETEROSCOPY/STONE EXTRACTION WITH BASKET Right 07/14/2016   Procedure: CYSTOSCOPY/RETROGRADE/URETEROSCOPY/STONE EXTRACTION WITH BASKET;  Surgeon: Malen Gauze, MD;  Location: [facility];  Service: Urology;  Laterality: Right;  . DIAGNOSTIC LAPAROSCOPIC LIVER BIOPSY N/A 02/10/2013   Procedure: DIAGNOSTIC LAPAROSCOPIC ;  Surgeon:  Adin Hector, MD;  Location: [facility];  Service: General;  Laterality: N/A;  DIAGNOSTIC LAPAROSCOPY,laparoscopic ventral hernia repair with mesh lysis of adhesions  . HERNIA REPAIR    . HOLMIUM LASER APPLICATION Right 03/15/2438   Procedure: HOLMIUM LASER APPLICATION;  Surgeon: Cleon Gustin, MD;  Location: [facility];  Service: Urology;  Laterality: Right;  . SPLENECTOMY  2004  .  TUBAL LIGATION    . VAGINAL HYSTERECTOMY N/A 01/10/2017   Procedure: HYSTERECTOMY VAGINAL W/ BILATERAL SALPINGECTOMY WITH REPAIR OF INCIDENTAL CYSTOTOMY;  Surgeon: Chancy Milroy, MD;  Location: [facility];  Service: Gynecology;  Laterality: N/A;    OB History    Gravida  5   Para      Term      Preterm      AB      Living  5     SAB      TAB      Ectopic      Multiple      Live Births  5            Home Medications    Prior to Admission medications   Medication Sig Start Date End Date Taking? Authorizing Provider  albuterol (PROVENTIL HFA;VENTOLIN HFA) 108 (90 Base) MCG/ACT inhaler Inhale 2 puffs into the lungs every 6 (six) hours as needed for wheezing or shortness of breath. Patient not taking: Reported on 02/14/2017 07/04/16   Nche, Charlene Brooke, NP  cyclobenzaprine (FLEXERIL) 10 MG tablet Take 1 tablet (10 mg total) by mouth 3 (three) times daily as needed for muscle spasms. Patient not taking: Reported on 02/14/2017 01/13/17   Chancy Milroy, MD  docusate sodium (COLACE) 100 MG capsule Take 1 capsule (100 mg total) by mouth 2 (two) times daily. Patient not taking: Reported on 02/14/2017 01/13/17   Chancy Milroy, MD  fluticasone-salmeterol (ADVAIR HFA) 230-21 MCG/ACT inhaler Inhale 2 puffs into the lungs 2 (two) times daily. Rinse mouth after each use Patient not taking: Reported on 02/14/2017 07/04/16   Nche, Charlene Brooke, NP  ibuprofen (ADVIL,MOTRIN) 800 MG tablet Take 1 tablet (800 mg total) by mouth 3 (three) times daily. 02/14/17   Chancy Milroy, MD  Multiple Vitamin (MULTIVITAMIN WITH MINERALS) TABS tablet Take 1 tablet by mouth daily.    [provider]  phenazopyridine (PYRIDIUM) 200 MG tablet Take 1 tablet (200 mg total) by mouth 3 (three) times daily. 10/12/17   Rasch, Anderson Malta I, NP  QUEtiapine (SEROQUEL) 300 MG tablet Take 300 mg by mouth at bedtime.    [provider]  sertraline (ZOLOFT) 50 MG tablet Take 50 mg by mouth daily.      [provider]    Family History Family History  Problem Relation Age of Onset  . Alcohol abuse Mother   . Alcohol abuse Father   . Cancer Maternal Grandmother        colon  . Diabetes Maternal Grandmother   . Hypertension Maternal Grandmother   . Alcohol abuse Sister   . Thyroid disease Neg Hx     Social History Social History   Tobacco Use  . Smoking status: Current Every Day Smoker    Packs/day: 1.00    Years: 32.00    Pack years: 32.00    Types: Cigarettes  . Smokeless tobacco: Never Used  Substance Use Topics  . Alcohol use: No    Comment: quit 12/2012  . Drug use: No    Types: "Crack" cocaine,  Marijuana, Cocaine    Comment: per pt quit 12-26-2012     Allergies   Patient has no known allergies.   Review of Systems Review of Systems   Physical Exam Triage Vital Signs ED Triage Vitals  Enc Vitals Group     BP 03/17/19 1329 120/67     Pulse Rate 03/17/19 1329 74     Resp 03/17/19 1329 15     Temp 03/17/19 1329 98.4 F (36.9 C)     Temp Source 03/17/19 1329 Oral     SpO2 03/17/19 1329 97 %     Weight --      Height --      Head Circumference --      Peak Flow --      Pain Score 03/17/19 1327 0     Pain Loc --      Pain Edu? --      Excl. in GC? --    No data found.  Updated Vital Signs BP 120/67 (BP Location: Left Arm)   Pulse 74   Temp 98.4 F (36.9 C) (Oral)   Resp 15   LMP  (LMP Unknown)   SpO2 97%    Physical Exam Constitutional:      General: She is not in acute distress.    Appearance: She is well-developed.  Cardiovascular:     Rate and Rhythm: Normal rate.  Pulmonary:     Effort: Pulmonary effort is normal.  Abdominal:     Palpations: Abdomen is soft. Abdomen is not rigid.     Tenderness: There is no abdominal tenderness. There is no guarding or rebound.  Genitourinary:    Comments: Denies sores, lesions, vaginal bleeding; no pelvic pain; gu exam deferred at this time, vaginal self swab collected.   Skin:     General: Skin is warm and dry.  Neurological:     Mental Status: She is alert and oriented to person, place, and time.      UC Treatments / Results  Labs (all labs ordered are listed, but only abnormal results are displayed) Labs Reviewed  CERVICOVAGINAL ANCILLARY ONLY    EKG   Radiology No results found.  Procedures Procedures (including critical care time)  Medications Ordered in UC Medications - No data to display  Initial Impression / Assessment and Plan / UC Course  I have reviewed the triage vital signs and the nursing notes.  Pertinent labs & imaging results that were available during my care of the patient were reviewed by me and considered in my medical decision making (see chart for details).     Non toxic, afebrile. No abdominal pain, no urinary symptoms. Vaginal discharge. Std vs BV discussed and considered. Will await final cytology results to initiate treatment, patient agreeable. Return precautions provided. Patient verbalized understanding and agreeable to plan.   Final Clinical Impressions(s) / UC Diagnoses   Final diagnoses:  Vaginal discharge  Screen for STD (sexually transmitted disease)     Discharge Instructions     Will notify you of any positive findings and if any changes to treatment are needed.  Will send any needed medications to your pharmacy if indicated.  You may monitor your results on your MyChart online as well.      ED Prescriptions    None     PDMP not reviewed this encounter.   Georgetta Haber, NP 03/17/19 1554

## 2019-03-17 NOTE — Discharge Instructions (Signed)
Will notify you of any positive findings and if any changes to treatment are needed.  Will send any needed medications to your pharmacy if indicated.  You may monitor your results on your MyChart online as well.

## 2019-03-17 NOTE — ED Triage Notes (Addendum)
Pt presents to the UC for STD test. Pt states having white milky vaginal discharge and vaginal itching x 5 days; urinary frequency x 2 days.

## 2019-03-19 ENCOUNTER — Telehealth (HOSPITAL_COMMUNITY): Payer: Self-pay | Admitting: Emergency Medicine

## 2019-03-19 LAB — CERVICOVAGINAL ANCILLARY ONLY
Bacterial vaginitis: POSITIVE — AB
Candida vaginitis: NEGATIVE
Chlamydia: NEGATIVE
Neisseria Gonorrhea: NEGATIVE
Trichomonas: NEGATIVE

## 2019-03-19 MED ORDER — METRONIDAZOLE 500 MG PO TABS: 500.0000 mg | ORAL_TABLET | Freq: Two times a day (BID) | ORAL | 0 refills | Status: AC

## 2019-03-19 NOTE — Addendum Note (Signed)
Addended by: Ashley Royalty on: 03/19/2019 05:13 PM   Modules accepted: Orders

## 2019-03-19 NOTE — Telephone Encounter (Signed)
Bacterial vaginosis is positive. Pt needs treatment. Flagyl 500 mg BID x 7 days #14 no refills sent to patients pharmacy of choice.    Attempted to reach patient. No answer at this time. Voicemail left.     

## 2019-03-21 ENCOUNTER — Telehealth (HOSPITAL_COMMUNITY): Payer: Self-pay | Admitting: Emergency Medicine

## 2019-03-21 NOTE — Telephone Encounter (Signed)
Attempted to reach patient x2. No answer at this time. Voicemail left.    

## 2019-07-25 ENCOUNTER — Encounter: Payer: Self-pay | Admitting: Emergency Medicine

## 2019-07-25 ENCOUNTER — Ambulatory Visit
Admission: EM | Admit: 2019-07-25 | Discharge: 2019-07-25 | Disposition: A | Discharge status: Home / Self Care | Payer: No Typology Code available for payment source | Attending: Physician Assistant | Admitting: Physician Assistant

## 2019-07-25 ENCOUNTER — Other Ambulatory Visit: Payer: Self-pay

## 2019-07-25 DIAGNOSIS — N898 Other specified noninflammatory disorders of vagina: Secondary | ICD-10-CM

## 2019-07-25 LAB — POCT URINALYSIS DIP (MANUAL ENTRY)
Bilirubin, UA: NEGATIVE
Blood, UA: NEGATIVE
Glucose, UA: NEGATIVE mg/dL
Ketones, POC UA: NEGATIVE mg/dL
Leukocytes, UA: NEGATIVE
Nitrite, UA: NEGATIVE
Protein Ur, POC: NEGATIVE mg/dL
Spec Grav, UA: 1.025 (ref 1.010–1.025)
Urobilinogen, UA: 1 E.U./dL
pH, UA: 7 (ref 5.0–8.0)

## 2019-07-25 MED ORDER — METRONIDAZOLE 500 MG PO TABS: 500.0000 mg | ORAL_TABLET | Freq: Two times a day (BID) | ORAL | 0 refills | Status: DC

## 2019-07-25 MED ORDER — FLUCONAZOLE 150 MG PO TABS: 150.0000 mg | ORAL_TABLET | Freq: Every day | ORAL | 0 refills | Status: DC

## 2019-07-25 MED ORDER — ALBUTEROL SULFATE HFA 108 (90 BASE) MCG/ACT IN AERS: 1.0000 | INHALATION_SPRAY | Freq: Four times a day (QID) | RESPIRATORY_TRACT | 0 refills | Status: DC | PRN

## 2019-07-25 MED ORDER — ONDANSETRON 4 MG PO TBDP: 4.0000 mg | ORAL_TABLET | Freq: Three times a day (TID) | ORAL | 0 refills | Status: DC | PRN

## 2019-07-25 NOTE — Discharge Instructions (Signed)
You were treated empirically for BV. Start flagyl as directed. Diflucan to cover for yeast. Zofran as needed for nausea. Cytology sent, you will be contacted with any positive results that requires further treatment. Refrain from sexual activity and alcohol use for the next 7 days.  Monitor for any worsening of symptoms, fever, abdominal pain to follow up for reevaluation.  I have refilled your albuterol. Please follow up with PCP for further evaluation and management of COPD.

## 2019-07-25 NOTE — ED Triage Notes (Signed)
Pt here for vaginal discharge with odor and some dysuria x 2 weeks; pt sts hx of BV in past

## 2019-07-25 NOTE — ED Provider Notes (Signed)
[facility]    CSN: [id number] Arrival date & time: 07/25/19  1538      History   Chief Complaint Chief Complaint  Patient presents with  . Dysuria  . Vaginal Discharge    HPI Mary [name] is a 54 y.o. female.   54 year old female comes in for 2 week history of vaginal discharge. Has vaginal itching, irritation, odor. Nausea without vomiting. Denies abdominal pain, diarrhea. Urinary frequency. Denies dysuria, hematuria. Denies fever, chills, body aches. Not currently sexually active. Last sexual activity 6 months ago.   Patient with history of COPD, without current PCP, not using inhalers. Would like refill.      Past Medical History:  Diagnosis Date  . Bipolar depression (HCC)   . Borderline personality disorder (HCC)   . COPD (chronic obstructive pulmonary disease) (HCC)   . GERD (gastroesophageal reflux disease)   . Hiatal hernia   . History of alcohol abuse    per pt none since 10/ 2014  . History of attempted suicide   . History of drug abuse in remission (HCC)    per pt in remission since 12-26-2012  polysubstance dependence (alcohol, crack, cocaine, 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  . History of hepatitis C per pt first dx 2009 (approx)   consulted w/ dr comer (infectious disease) note 04-26-2016 , pt states was called and told per lab work no longer has hepatitis c   . History of kidney stones   . Hypercalcemia    mild and long-standing--- pt asymptomatic  . Primary hyperparathyroidism [facility])    endocrinology --  dr Lucianne Muss  . Pruritic erythematous rash    lower legs  . PTSD (post-traumatic stress disorder)   . Right ureteral stone   . Schizophrenia (HCC)   . Vitamin D deficiency     Patient Active Problem List   Diagnosis Date Noted  . Post-operative state 01/10/2017  . Ureteral calculus 06/28/2016  . Pruritic erythematous rash 04/12/2016  . Reactive airway disease that is not asthma 04/12/2016  . Chronic hepatitis C without  hepatic coma (HCC)   . Drug abuse and dependence (HCC)   . Incarcerated incisional hernia s/p lap repair 02/10/2013 02/10/2013  . Polysubstance dependence (HCC) 10/16/2011  . Substance induced mood disorder (HCC) 10/16/2011  . GERD without esophagitis 07/04/2011    Past Surgical History:  Procedure Laterality Date  . CYSTOSCOPY N/A 01/10/2017   Procedure: CYSTOSCOPY;  Surgeon: Hermina Staggers, MD;  Location: [facility];  Service: Gynecology;  Laterality: N/A;  . CYSTOSCOPY W/ URETERAL STENT PLACEMENT Right 07/14/2016   Procedure: CYSTOSCOPY WITH STENT REPLACEMENT;  Surgeon: Malen Gauze, MD;  Location: [facility];  Service: Urology;  Laterality: Right;  . CYSTOSCOPY WITH RETROGRADE PYELOGRAM, URETEROSCOPY AND STENT PLACEMENT Right 06/28/2016   Procedure: CYSTOSCOPY WITH RETROGRADE PYELOGRAM, URETEROSCOPY AND STENT PLACEMENT;  Surgeon: Malen Gauze, MD;  Location: [facility];  Service: Urology;  Laterality: Right;  . CYSTOSCOPY/RETROGRADE/URETEROSCOPY/STONE EXTRACTION WITH BASKET Right 07/14/2016   Procedure: CYSTOSCOPY/RETROGRADE/URETEROSCOPY/STONE EXTRACTION WITH BASKET;  Surgeon: Malen Gauze, MD;  Location: [facility];  Service: Urology;  Laterality: Right;  . DIAGNOSTIC LAPAROSCOPIC LIVER BIOPSY N/A 02/10/2013   Procedure: DIAGNOSTIC LAPAROSCOPIC ;  Surgeon: Ardeth Sportsman, MD;  Location: [facility];  Service: General;  Laterality: N/A;  DIAGNOSTIC LAPAROSCOPY,laparoscopic ventral hernia repair with mesh lysis of adhesions  . HERNIA REPAIR    . HOLMIUM LASER APPLICATION Right 07/14/2016   Procedure: HOLMIUM  LASER APPLICATION;  Surgeon: Malen Gauze, MD;  [facility]: [facility];  Service: Urology;  Laterality: Right;  . SPLENECTOMY  2004  . TUBAL LIGATION    . VAGINAL HYSTERECTOMY N/A 01/10/2017   Procedure: HYSTERECTOMY VAGINAL W/ BILATERAL SALPINGECTOMY WITH REPAIR OF INCIDENTAL CYSTOTOMY;  Surgeon: Hermina Staggers, MD;  Location:  [facility];  Service: Gynecology;  Laterality: N/A;    OB History    Gravida  5   Para      Term      Preterm      AB      Living  5     SAB      TAB      Ectopic      Multiple      Live Births  5            Home Medications    Prior to Admission medications   Medication Sig Start Date End Date Taking? Authorizing Provider  albuterol (VENTOLIN HFA) 108 (90 Base) MCG/ACT inhaler Inhale 1-2 puffs into the lungs every 6 (six) hours as needed for wheezing or shortness of breath. 07/25/19   Cathie Hoops, Calandra Madura V, PA-C  fluconazole (DIFLUCAN) 150 MG tablet Take 1 tablet (150 mg total) by mouth daily. Take second dose 72 hours later if symptoms still persists. 07/25/19   Cathie Hoops, Sidharth Leverette V, PA-C  ibuprofen (ADVIL,MOTRIN) 800 MG tablet Take 1 tablet (800 mg total) by mouth 3 (three) times daily. 02/14/17   Hermina Staggers, MD  metroNIDAZOLE (FLAGYL) 500 MG tablet Take 1 tablet (500 mg total) by mouth 2 (two) times daily. 07/25/19   Cathie Hoops, Darean Rote V, PA-C  Multiple Vitamin (MULTIVITAMIN WITH MINERALS) TABS tablet Take 1 tablet by mouth daily.    [provider]  ondansetron (ZOFRAN ODT) 4 MG disintegrating tablet Take 1 tablet (4 mg total) by mouth every 8 (eight) hours as needed for nausea or vomiting. 07/25/19   Cathie Hoops, Vear Staton V, PA-C  phenazopyridine (PYRIDIUM) 200 MG tablet Take 1 tablet (200 mg total) by mouth 3 (three) times daily. 10/12/17   Rasch, Victorino Dike I, NP  QUEtiapine (SEROQUEL) 300 MG tablet Take 300 mg by mouth at bedtime.    [provider]  sertraline (ZOLOFT) 50 MG tablet Take 50 mg by mouth daily.     [provider]  fluticasone-salmeterol (ADVAIR HFA) 230-21 MCG/ACT inhaler Inhale 2 puffs into the lungs 2 (two) times daily. Rinse mouth after each use Patient not taking: Reported on 02/14/2017 07/04/16 07/25/19  Nche, Bonna Gains, NP    Family History Family History  Problem Relation Age of Onset  . Alcohol abuse Mother   . Alcohol abuse Father   . Cancer Maternal  Grandmother        colon  . Diabetes Maternal Grandmother   . Hypertension Maternal Grandmother   . Alcohol abuse Sister   . Thyroid disease Neg Hx     Social History Social History   Tobacco Use  . Smoking status: Current Every Day Smoker    Packs/day: 1.00    Years: 32.00    Pack years: 32.00    Types: Cigarettes  . Smokeless tobacco: Never Used  Substance Use Topics  . Alcohol use: No    Comment: quit 12/2012  . Drug use: No    Types: "Crack" cocaine, Marijuana, Cocaine    Comment: per pt quit 12-26-2012     Allergies   Patient has no known allergies.   Review of Systems  Review of Systems  Reason unable to perform ROS: See HPI as above.     Physical Exam Triage Vital Signs ED Triage Vitals  Enc Vitals Group     BP 07/25/19 1550 101/68     Pulse Rate 07/25/19 1550 74     Resp 07/25/19 1550 18     Temp 07/25/19 1550 97.9 F (36.6 C)     Temp Source 07/25/19 1550 Oral     SpO2 07/25/19 1550 96 %     Weight --      Height --      Head Circumference --      Peak Flow --      Pain Score 07/25/19 1551 3     Pain Loc --      Pain Edu? --      Excl. in [place]? --    No data found.  Updated Vital Signs BP 101/68 (BP Location: Left Arm)   Pulse 74   Temp 97.9 F (36.6 C) (Oral)   Resp 18   LMP  (LMP Unknown)   SpO2 96%   Visual Acuity Right Eye Distance:   Left Eye Distance:   Bilateral Distance:    Right Eye Near:   Left Eye Near:    Bilateral Near:     Physical Exam Constitutional:      General: She is not in acute distress.    Appearance: She is well-developed. She is not ill-appearing, toxic-appearing or diaphoretic.  HENT:     Head: Normocephalic and atraumatic.  Eyes:     Conjunctiva/sclera: Conjunctivae normal.     Pupils: Pupils are equal, round, and reactive to light.  Cardiovascular:     Rate and Rhythm: Normal rate and regular rhythm.  Pulmonary:     Effort: Pulmonary effort is normal. No respiratory distress.     Comments:  LCTAB Abdominal:     General: Bowel sounds are normal.     Palpations: Abdomen is soft.     Tenderness: There is no abdominal tenderness. There is no right CVA tenderness, left CVA tenderness, guarding or rebound.  Musculoskeletal:     Cervical back: Normal range of motion and neck supple.  Skin:    General: Skin is warm and dry.  Neurological:     Mental Status: She is alert and oriented to person, place, and time.  Psychiatric:        Behavior: Behavior normal.        Judgment: Judgment normal.      UC Treatments / Results  Labs (all labs ordered are listed, but only abnormal results are displayed) Labs Reviewed  POCT URINALYSIS DIP (MANUAL ENTRY)  CERVICOVAGINAL ANCILLARY ONLY    EKG   Radiology No results found.  Procedures Procedures (including critical care time)  Medications Ordered in UC Medications - No data to display  Initial Impression / Assessment and Plan / UC Course  I have reviewed the triage vital signs and the nursing notes.  Pertinent labs & imaging results that were available during my care of the patient were reviewed by me and considered in my medical decision making (see chart for details).    Patient was treated empirically for BV. Flagyl as directed. Diflucan for possible yeast.  Cytology sent, patient will be contacted with any positive results that require additional treatment. Patient to refrain from sexual activity for the next 7 days. Return precautions given.   Will refill albuterol. Follow up with PCP for further management of COPD. Resources  provided.  Final Clinical Impressions(s) / UC Diagnoses   Final diagnoses:  Vaginal discharge   ED Prescriptions    Medication Sig Dispense Auth. Provider   albuterol (VENTOLIN HFA) 108 (90 Base) MCG/ACT inhaler Inhale 1-2 puffs into the lungs every 6 (six) hours as needed for wheezing or shortness of breath. 18 g Arlis Everly V, PA-C   metroNIDAZOLE (FLAGYL) 500 MG tablet Take 1 tablet (500 mg  total) by mouth 2 (two) times daily. 14 tablet Atiana Levier V, PA-C   fluconazole (DIFLUCAN) 150 MG tablet Take 1 tablet (150 mg total) by mouth daily. Take second dose 72 hours later if symptoms still persists. 2 tablet Lerae Langham V, PA-C   ondansetron (ZOFRAN ODT) 4 MG disintegrating tablet Take 1 tablet (4 mg total) by mouth every 8 (eight) hours as needed for nausea or vomiting. 15 tablet Belinda Fisher, PA-C     PDMP not reviewed this encounter.   Belinda Fisher, PA-C 07/25/19 1622

## 2019-07-29 LAB — CERVICOVAGINAL ANCILLARY ONLY
Chlamydia: NEGATIVE
Comment: NEGATIVE
Comment: NEGATIVE
Comment: NORMAL
Neisseria Gonorrhea: NEGATIVE
Trichomonas: NEGATIVE

## 2019-10-06 ENCOUNTER — Other Ambulatory Visit: Payer: Self-pay

## 2019-10-06 ENCOUNTER — Ambulatory Visit (INDEPENDENT_AMBULATORY_CARE_PROVIDER_SITE_OTHER): Payer: No Payment, Other | Admitting: Licensed Clinical Social Worker

## 2019-10-06 DIAGNOSIS — F411 Generalized anxiety disorder: Secondary | ICD-10-CM | POA: Diagnosis not present
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
## 2019-10-08 ENCOUNTER — Telehealth (HOSPITAL_COMMUNITY): Payer: Self-pay | Admitting: *Deleted

## 2019-10-08 ENCOUNTER — Other Ambulatory Visit (HOSPITAL_COMMUNITY): Payer: Self-pay | Admitting: *Deleted

## 2019-10-08 DIAGNOSIS — F411 Generalized anxiety disorder: Secondary | ICD-10-CM

## 2019-10-08 MED ORDER — QUETIAPINE FUMARATE 50 MG PO TABS: 50.0000 mg | ORAL_TABLET | Freq: Every day | ORAL | 0 refills | Status: DC

## 2019-10-08 NOTE — Progress Notes (Signed)
Verbal order from Dr. Lucianne Muss to bridge patient until she can be seen for first evaluation.

## 2019-10-08 NOTE — Telephone Encounter (Signed)
Call from patient needing her RX now, will run out before her first time seeing the provider here on 10/22/19. She has seen her therapist once here, but not the provider. She is transferring from [place] but they are no longer agreeing to bridge patients meds until their first appt. Consulted with Dr Lucianne Muss and she has agreed to call in RX to maintain her on her meds for her mental stability and safety.  Rxs called in to her pharmacy.

## 2019-10-22 ENCOUNTER — Telehealth (HOSPITAL_COMMUNITY): Payer: Self-pay

## 2019-11-09 ENCOUNTER — Telehealth (HOSPITAL_COMMUNITY): Payer: Self-pay | Admitting: Adult Health

## 2019-11-10 ENCOUNTER — Telehealth (HOSPITAL_COMMUNITY): Payer: Self-pay | Admitting: *Deleted

## 2019-11-10 ENCOUNTER — Other Ambulatory Visit: Payer: Self-pay

## 2019-11-10 ENCOUNTER — Ambulatory Visit (INDEPENDENT_AMBULATORY_CARE_PROVIDER_SITE_OTHER): Payer: No Payment, Other | Admitting: Licensed Clinical Social Worker

## 2019-11-10 DIAGNOSIS — F411 Generalized anxiety disorder: Secondary | ICD-10-CM

## 2019-11-10 NOTE — Telephone Encounter (Signed)
Call from patient stating no one sent her a link or contacted her yesterday for her appt. She is completely out of her Seroquel which she should be as we only wrote her a RX for just enough Seroquel to link her to her next appt. She has seen a counselor here but has not had her first appt with a Dr. She uses [facility] at [facility] @[phone]. Our front desk is unable to schedule her before Oct. Will reach out to a provider to see about bridging her again with her Seroquel till Oct. Patient states she has enough of her Depakote and Wellbutrin till she comes in.

## 2019-11-10 NOTE — Telephone Encounter (Signed)
This pt will need to be seen by a provider before any prescriptions can be called in. Please offer appt for tomorrow with Dr. Doyne Keel at 5 pm and then tell front desk to add her in there. Thanks.

## 2019-11-10 NOTE — Telephone Encounter (Signed)
Instructions from Dr are to have her seen virtually by Toy Cookey NP for first time appt and medications. She is completely out of her Seroquel and wanted assurance she would be able to get a rx tomorrow. She is able to keep that appt, She needs a link sent to her she is not on my chart.

## 2019-11-10 NOTE — Progress Notes (Signed)
THERAPIST PROGRESS NOTE  Session Time: 40 min Virtual Visit via Video Note  I connected with Mary [name] on 11/10/19 at 11:00 AM EDT by a video enabled telemedicine application and verified that I am speaking with the correct person using two identifiers.  Location: Patient: In car and then home Provider: [facility]   I discussed the limitations of evaluation and management by telemedicine and the availability of in person appointments. The patient expressed understanding and agreed to proceed. I discussed the assessment and treatment plan with the patient. The patient was provided an opportunity to ask questions and all were answered. The patient agreed with the plan and demonstrated an understanding of the instructions.   I provided 40 minutes of non-face-to-face time during this encounter. [place] Sink, LCSW    Participation Level: Active  Behavioral Response: CasualAlertEuthymic  Type of Therapy: Individual Therapy  Treatment Goals addressed: Anxiety and Coping  Interventions: CBT, Motivational Interviewing and Supportive  Summary: Mary [name] is a 54 y.o. female who presents with hx of GAD. This date LCSW sends link for video session via text. When pt does not sign on LCSW phones her. Pt states she is driving and will be home within minutes. LCSW advised for pt to drive safely and sign on as soon as she gets home, which pt does. When asked how pt is doing she states "[place]". She said the only exception is she is out of her seroquel. Discussed med management and advised pt of her appt for med management 9/1. Pt verbalizes understanding. Pt presents in a postive mood. She remains completely sober. LCSW assessed for start of new job. 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 She states she loves the new job and is making in excess of $2.00 more per hour. Provides details of what she is doing including reporting job is in [place]. Pt  states she was fearful of driving but "likes the drive" as she uses the time driving over to get her thoughts together and on the way back is able to debrief about the day to be more relaxed once home. Reports she is getting along with everyone to date. She is getting 40 hrs per wk. Pt did say there was one day she almost called out as she felt sluggish when she got up. Instead pt showered, felt better and went in. LCSW provided much positive reinforcement for her choices. Pt expresses some concern about being paid weekly rather than every other wk and hopes she can keep herself straight. Reviewed budgeting and benefits of saving when there is an increase in pay. She is hopeful she can get gov ins and plans to look into same. Pt talks about letting her fear of changing jobs keep her in a poor situation for so long at [employer]. LCSW commended pt for conquering her fear of change and seeing the rewards. Pt is able to say she is proud of herself. Commended pt on the positive view of her drive to [place] from [place] daily. LCSW proivided detailed education on the power of self talk with pt's verbal understanding and agreement to be more mindful of same. Pt denies other worries/concerns to address this date. Pt states she feels so much better after talking today and states "You make me feel good about myself". Clarified LCSW only acknowledging the good choices she made on her own with goal of increasing her ability to do more of same for herself  with positive self talk, reality measures. LCSW reviewed poc with pt's verbal agreement prior to close of session.       Suicidal/Homicidal: Nowithout intent/plan  Therapist Response: Pt receptive and open to care.  Plan: Return again in 2 weeks.  Diagnosis: Axis I: Generalized Anxiety Disorder    Axis II: Deferred  [place] Sink, LCSW 11/10/2019

## 2019-11-11 ENCOUNTER — Other Ambulatory Visit: Payer: Self-pay

## 2019-11-11 ENCOUNTER — Encounter (HOSPITAL_COMMUNITY): Payer: Self-pay | Admitting: Psychiatry

## 2019-11-11 ENCOUNTER — Telehealth (INDEPENDENT_AMBULATORY_CARE_PROVIDER_SITE_OTHER): Payer: No Payment, Other | Admitting: Psychiatry

## 2019-11-11 DIAGNOSIS — F3130 Bipolar disorder, current episode depressed, mild or moderate severity, unspecified: Secondary | ICD-10-CM | POA: Insufficient documentation

## 2019-11-11 DIAGNOSIS — F411 Generalized anxiety disorder: Secondary | ICD-10-CM

## 2019-11-11 DIAGNOSIS — F319 Bipolar disorder, unspecified: Secondary | ICD-10-CM | POA: Insufficient documentation

## 2019-11-11 MED ORDER — QUETIAPINE FUMARATE 50 MG PO TABS: 50.0000 mg | ORAL_TABLET | Freq: Every day | ORAL | 2 refills | Status: DC

## 2019-11-11 MED ORDER — BUPROPION HCL ER (XL) 150 MG PO TB24: 150.0000 mg | ORAL_TABLET | ORAL | 2 refills | Status: DC

## 2019-11-11 MED ORDER — QUETIAPINE FUMARATE 50 MG PO TABS: 50.0000 mg | ORAL_TABLET | Freq: Every day | ORAL | 0 refills | Status: DC

## 2019-11-11 MED ORDER — DIVALPROEX SODIUM 500 MG PO DR TAB: 500.0000 mg | DELAYED_RELEASE_TABLET | Freq: Two times a day (BID) | ORAL | 2 refills | Status: DC
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
## 2019-11-13 ENCOUNTER — Other Ambulatory Visit (HOSPITAL_COMMUNITY): Payer: Self-pay | Admitting: Psychiatry

## 2019-11-13 ENCOUNTER — Telehealth (HOSPITAL_COMMUNITY): Payer: Self-pay | Admitting: *Deleted

## 2019-11-13 DIAGNOSIS — F3130 Bipolar disorder, current episode depressed, mild or moderate severity, unspecified: Secondary | ICD-10-CM

## 2019-11-13 MED ORDER — QUETIAPINE FUMARATE 50 MG PO TABS: 50.0000 mg | ORAL_TABLET | Freq: Every day | ORAL | 2 refills | Status: DC

## 2019-11-13 NOTE — Telephone Encounter (Signed)
Has called twice this am and left VM and its 815 am. Agitated about not getting her Seroquel. Order is in Epic, not sure why she doesn't think she got it, pharmacy not open at this time. Will follow up with her after can check with pharmacy.

## 2019-11-13 NOTE — Telephone Encounter (Signed)
Medication reordered and sent to preferred pharmacy.

## 2019-11-24 ENCOUNTER — Telehealth (HOSPITAL_COMMUNITY): Payer: Self-pay | Admitting: Licensed Clinical Social Worker

## 2019-11-24 ENCOUNTER — Ambulatory Visit (HOSPITAL_COMMUNITY): Payer: No Payment, Other | Admitting: Licensed Clinical Social Worker

## 2019-11-24 ENCOUNTER — Other Ambulatory Visit: Payer: Self-pay

## 2019-11-24 NOTE — Telephone Encounter (Signed)
LCSW sent link via text for video session per pt's preference. When pt failed to sign on for session LCSW phoned pt. Phone rang multiple times with a message coming on to say the voice mail has not been set up so clinician unable to leave a message.

## 2019-12-08 ENCOUNTER — Ambulatory Visit (HOSPITAL_COMMUNITY): Payer: No Payment, Other | Admitting: Licensed Clinical Social Worker

## 2019-12-08 ENCOUNTER — Telehealth (HOSPITAL_COMMUNITY): Payer: Self-pay | Admitting: Licensed Clinical Social Worker

## 2019-12-08 ENCOUNTER — Other Ambulatory Visit: Payer: Self-pay

## 2019-12-08 NOTE — Telephone Encounter (Signed)
LCSW sent video link for virtual session. When pt did not sign on for session LCSW phoned pt. Pt answers and states she is at her sil funeral. She states sil died of COVID and she will not be able to attend session. LCSW informed pt of next appt and strongly encouraged her to cancel ahead of time d/t no show policy if she can not make next session. Pt verbalizes understanding. Pt thanks LCSW for call.

## 2019-12-22 ENCOUNTER — Ambulatory Visit (HOSPITAL_COMMUNITY): Payer: No Payment, Other | Admitting: Licensed Clinical Social Worker

## 2019-12-22 ENCOUNTER — Telehealth (HOSPITAL_COMMUNITY): Payer: Self-pay | Admitting: Licensed Clinical Social Worker

## 2019-12-22 ENCOUNTER — Other Ambulatory Visit: Payer: Self-pay

## 2019-12-22 NOTE — Telephone Encounter (Signed)
LCSW sent link for scheduled video appt. When pt failed to sign on LCSW called pt. She reports she got a new job with [employer]. She is bused to [place], [place] daily. She states she is on the bus on the way back. LCSW explained no show policy and discussed her missing last two appointments. LCSW provided phone contact info for her to be back in touch re her options for care once she figures out her schedule and when she can successfully keep appointments. Pt apologetic and states understanding.

## 2020-02-10 ENCOUNTER — Other Ambulatory Visit: Payer: Self-pay

## 2020-02-10 ENCOUNTER — Telehealth (INDEPENDENT_AMBULATORY_CARE_PROVIDER_SITE_OTHER): Payer: No Payment, Other | Admitting: Psychiatry

## 2020-02-10 ENCOUNTER — Encounter (HOSPITAL_COMMUNITY): Payer: Self-pay | Admitting: Psychiatry

## 2020-02-10 DIAGNOSIS — F411 Generalized anxiety disorder: Secondary | ICD-10-CM | POA: Diagnosis not present

## 2020-02-10 DIAGNOSIS — F3130 Bipolar disorder, current episode depressed, mild or moderate severity, unspecified: Secondary | ICD-10-CM | POA: Diagnosis not present

## 2020-02-10 MED ORDER — DIVALPROEX SODIUM 500 MG PO DR TAB: 500.0000 mg | DELAYED_RELEASE_TABLET | Freq: Two times a day (BID) | ORAL | 2 refills | Status: DC

## 2020-02-10 MED ORDER — BUPROPION HCL ER (XL) 150 MG PO TB24: 150.0000 mg | ORAL_TABLET | ORAL | 2 refills | Status: DC

## 2020-02-10 MED ORDER — QUETIAPINE FUMARATE 50 MG PO TABS: 50.0000 mg | ORAL_TABLET | Freq: Every day | ORAL | 2 refills | Status: DC

## 2020-02-10 NOTE — Progress Notes (Addendum)
BH MD/PA/NP OP Progress Note Virtual Visit via Telephone Note  I connected with Mary [name] on 02/11/20 at  4:30 PM EST by telephone and verified that I am speaking with the correct person using two identifiers.  Location: Patient: home Provider: Clinic   I discussed the limitations, risks, security and privacy concerns of performing an evaluation and management service by telephone and the availability of in person appointments. I also discussed with the patient that there may be a patient responsible charge related to this service. The patient expressed understanding and agreed to proceed.   I provided of non-face-to-face time during this encounter.   02/10/2020 4:42 PM Mary [name]  MRN:  [id number]  Chief Complaint: "Since I got this job things have been good"  HPI: 54 year old female seen today for follow up psychiatric evaluation. She has a psychiatric history of cocaine dependence (in remission), marijuana dependence (in remission), borderline personality disorder, schizophrenia, substance induced mood disorder, PTSD, and poly substance use. She is currently being managed on Wellbutrin XL 150 mg daily, Seroquel 50 mg daily, and Depakote 500 mg twice daily. She noted that her current medication regimen is effective in managing her psychiatric conditions.   Today she is unable to login virtually so the assessment was conducted over the telephone.  On exam she is pleasant, cooperative, and engaged in conversation.  She informed provider that she recently started a new job and was leaving work.  She informed provider that her anxiety and depression have been improving since starting her new job at [employer]. She informed provider that she has been working there for the past two moths. She endorsed adequate sleep and appetite and denies SI/HI/VAH or paranoia.   Patient informed provider that she continues to maintain her sobriety.  She noted that she has  been sober from cocaine and marijuana for 8 years.  She informed provider that she avoids going to [facility] because she does not want to trigger past habits.  She also noted that her children often tell her they will visit her however reported that they do not which saddens her.  She also reported that she would like them to better understand why she cannot come to [facility] however noted that it is difficult for them to understand.  She informed provider that she would like to start having individual therapy.  She noted she continues to go to [employer]   No medication changes made today. She is agreeable to continue all medications.  She will follow up with outpatient therapist for counseling.  No other concerns noted at this time.  Visit Diagnosis:    ICD-10-CM   1. GAD (generalized anxiety disorder)  F41.1 buPROPion (WELLBUTRIN XL) 150 MG 24 hr tablet    Ambulatory referral to Social Work  2. Bipolar I disorder, most recent episode depressed (HCC)  F31.30 divalproex (DEPAKOTE) 500 MG DR tablet    QUEtiapine (SEROQUEL) 50 MG tablet    Ambulatory referral to Social Work    Past Psychiatric History: cocaine dependence (in remission), marijuana dependence (in remission), borderline, schizophrenia substance induced mood disorder, PTSD, and poly substance use  Past Medical History:  Past Medical History:  Diagnosis Date  . Bipolar depression (HCC)   . Borderline personality disorder (HCC)   . COPD (chronic obstructive pulmonary disease) (HCC)   . GERD (gastroesophageal reflux disease)   . Hiatal hernia   . History of alcohol abuse    per pt none since 10/ 2014  .  History of attempted suicide   . History of drug abuse in remission (HCC)    per pt in remission since 12-26-2012  polysubstance dependence (alcohol, crack, cocaine, 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  . History of hepatitis C per pt first dx 2009 (approx)   consulted w/ dr comer (infectious disease) note  04-26-2016 , pt states was called and told per lab work no longer has hepatitis c   . History of kidney stones   . Hypercalcemia    mild and long-standing--- pt asymptomatic  . Primary hyperparathyroidism [facility])    endocrinology --  dr Lucianne Muss  . Pruritic erythematous rash    lower legs  . PTSD (post-traumatic stress disorder)   . Right ureteral stone   . Schizophrenia (HCC)   . Vitamin D deficiency     Past Surgical History:  Procedure Laterality Date  . CYSTOSCOPY N/A 01/10/2017   Procedure: CYSTOSCOPY;  Surgeon: Hermina Staggers, MD;  Location: [facility];  Service: Gynecology;  Laterality: N/A;  . CYSTOSCOPY W/ URETERAL STENT PLACEMENT Right 07/14/2016   Procedure: CYSTOSCOPY WITH STENT REPLACEMENT;  Surgeon: Malen Gauze, MD;  [facility];  Service: Urology;  Laterality: Right;  . CYSTOSCOPY WITH RETROGRADE PYELOGRAM, URETEROSCOPY AND STENT PLACEMENT Right 06/28/2016   Procedure: CYSTOSCOPY WITH RETROGRADE PYELOGRAM, URETEROSCOPY AND STENT PLACEMENT;  Surgeon: Malen Gauze, MD;  Location: [facility];  Service: Urology;  Laterality: Right;  . CYSTOSCOPY/RETROGRADE/URETEROSCOPY/STONE EXTRACTION WITH BASKET Right 07/14/2016   Procedure: CYSTOSCOPY/RETROGRADE/URETEROSCOPY/STONE EXTRACTION WITH BASKET;  Surgeon: Malen Gauze, MD;  Location: [facility];  Service: Urology;  Laterality: Right;  . DIAGNOSTIC LAPAROSCOPIC LIVER BIOPSY N/A 02/10/2013   Procedure: DIAGNOSTIC LAPAROSCOPIC ;  Surgeon: Ardeth Sportsman, MD;  Location[facility];  Service: General;  Laterality: N/A;  DIAGNOSTIC LAPAROSCOPY,laparoscopic ventral hernia repair with mesh lysis of adhesions  . HERNIA REPAIR    . HOLMIUM LASER APPLICATION Right 07/14/2016   Procedure: HOLMIUM LASER APPLICATION;  Surgeon: Malen Gauze, MD;  Location: [facility];  Service: Urology;  Laterality: Right;  . SPLENECTOMY  2004  . TUBAL LIGATION    . VAGINAL HYSTERECTOMY N/A  01/10/2017   Procedure: HYSTERECTOMY VAGINAL W/ BILATERAL SALPINGECTOMY WITH REPAIR OF INCIDENTAL CYSTOTOMY;  Surgeon: Hermina Staggers, MD;  Location: [facility];  Service: Gynecology;  Laterality: N/A;    Family Psychiatric History: Mother heroin use (over dosed and died), sister alcoholic, sister cocaine abuse, and maternal grandmother alcohol use. Five children were born addicted to cocaine.  Family History:  Family History  Problem Relation Age of Onset  . Alcohol abuse Mother   . Alcohol abuse Father   . Cancer Maternal Grandmother        colon  . Diabetes Maternal Grandmother   . Hypertension Maternal Grandmother   . Alcohol abuse Sister   . Thyroid disease Neg Hx     Social History:  Social History   Socioeconomic History  . Marital status: Single    Spouse name: Not on file  . Number of children: Not on file  . Years of education: Not on file  . Highest education level: Not on file  Occupational History  . Not on file  Tobacco Use  . Smoking status: Current Every Day Smoker    Packs/day: 1.00    Years: 32.00    Pack years: 32.00    Types: Cigarettes  . Smokeless tobacco: Never Used  Vaping Use  . Vaping  Use: Never used  Substance and Sexual Activity  . Alcohol use: No    Comment: quit 12/2012  . Drug use: No    Types: "Crack" cocaine, Marijuana, Cocaine    Comment: per pt quit 12-26-2012  . Sexual activity: Yes    Birth control/protection: None  Other Topics Concern  . Not on file  Social History Narrative  . Not on file   Social Determinants of Health   Financial Resource Strain:   . Difficulty of Paying Living Expenses: Not on file  Food Insecurity:   . Worried About Programme researcher, broadcasting/film/videounning Out of Food in the Last Year: Not on file  . Ran Out of Food in the Last Year: Not on file  Transportation Needs:   . Lack of Transportation (Medical): Not on file  . Lack of Transportation (Non-Medical): Not on file  Physical Activity:   . Days of Exercise per Week: Not on  file  . Minutes of Exercise per Session: Not on file  Stress:   . Feeling of Stress : Not on file  Social Connections:   . Frequency of Communication with Friends and Family: Not on file  . Frequency of Social Gatherings with Friends and Family: Not on file  . Attends Religious Services: Not on file  . Active Member of Clubs or Organizations: Not on file  . Attends BankerClub or Organization Meetings: Not on file  . Marital Status: Not on file    Allergies: No Known Allergies  Metabolic Disorder Labs: No results found for: HGBA1C, MPG No results found for: PROLACTIN No results found for: CHOL, TRIG, HDL, CHOLHDL, VLDL, LDLCALC No results found for: TSH  Therapeutic Level Labs: No results found for: LITHIUM No results found for: VALPROATE No components found for:  CBMZ  Current Medications: Current Outpatient Medications  Medication Sig Dispense Refill  . albuterol (VENTOLIN HFA) 108 (90 Base) MCG/ACT inhaler Inhale 1-2 puffs into the lungs every 6 (six) hours as needed for wheezing or shortness of breath. 18 g 0  . buPROPion (WELLBUTRIN XL) 150 MG 24 hr tablet Take 1 tablet (150 mg total) by mouth every morning. 30 tablet 2  . divalproex (DEPAKOTE) 500 MG DR tablet Take 1 tablet (500 mg total) by mouth 2 (two) times daily. 60 tablet 2  . fluconazole (DIFLUCAN) 150 MG tablet Take 1 tablet (150 mg total) by mouth daily. Take second dose 72 hours later if symptoms still persists. 2 tablet 0  . ibuprofen (ADVIL,MOTRIN) 800 MG tablet Take 1 tablet (800 mg total) by mouth 3 (three) times daily. 30 tablet 0  . metroNIDAZOLE (FLAGYL) 500 MG tablet Take 1 tablet (500 mg total) by mouth 2 (two) times daily. 14 tablet 0  . Multiple Vitamin (MULTIVITAMIN WITH MINERALS) TABS tablet Take 1 tablet by mouth daily.    . ondansetron (ZOFRAN ODT) 4 MG disintegrating tablet Take 1 tablet (4 mg total) by mouth every 8 (eight) hours as needed for nausea or vomiting. 15 tablet 0  . phenazopyridine (PYRIDIUM)  200 MG tablet Take 1 tablet (200 mg total) by mouth 3 (three) times daily. 6 tablet 0  . QUEtiapine (SEROQUEL) 50 MG tablet Take 1 tablet (50 mg total) by mouth at bedtime. 30 tablet 2   No current facility-administered medications for this visit.     Musculoskeletal: Strength & Muscle Tone: Unable to assess due to telephone visit Gait & Station: Unable to assess due to telephone visit Patient leans: N/A  Psychiatric Specialty Exam: Review of Systems  There were no vitals taken for this visit.There is no height or weight on file to calculate BMI.  General Appearance: Unable to assess due to telephone visit  Eye Contact:  Unable to assess due to telephone visit  Speech:  Clear and Coherent and Normal Rate  Volume:  Normal  Mood:  Euthymic  Affect:  Appropriate and Congruent  Thought Process:  Coherent, Goal Directed and Linear  Orientation:  Full (Time, Place, and Person)  Thought Content: WDL and Logical   Suicidal Thoughts:  No  Homicidal Thoughts:  No  Memory:  Immediate;   Good Recent;   Good Remote;   Good  Judgement:  Good  Insight:  Good  Psychomotor Activity:  Normal  Concentration:  Concentration: Good and Attention Span: Good  Recall:  Good  Fund of Knowledge: Good  Language: Good  Akathisia:  No  Handed:  Right  AIMS (if indicated): Not done  Assets:  Communication Skills Desire for Improvement Financial Resources/Insurance Housing Social Support  ADL's:  Intact  Cognition: WNL  Sleep:  Good   Screenings: PHQ2-9     Office Visit from 07/04/2016 in [facility] Office Visit from 04/26/2016 in [facility] for [facility]ious [facility]  PHQ-2 Total Score 6 0  PHQ-9 Total Score 15 --       Assessment and Plan: Patient informed provider that she is doing well on her current medication regimen.  No medication changes made today.  Patient agreeable to continue medications as prescribed.  1. GAD (generalized anxiety  disorder)  Continue- buPROPion (WELLBUTRIN XL) 150 MG 24 hr tablet; Take 1 tablet (150 mg total) by mouth every morning.  Dispense: 30 tablet; Refill: 2 - Ambulatory referral to Social Work  2. Bipolar I disorder, most recent episode depressed (HCC)  Continue- divalproex (DEPAKOTE) 500 MG DR tablet; Take 1 tablet (500 mg total) by mouth 2 (two) times daily.  Dispense: 60 tablet; Refill: 2 Continue- QUEtiapine (SEROQUEL) 50 MG tablet; Take 1 tablet (50 mg total) by mouth at bedtime.  Dispense: 30 tablet; Refill: 2 - Ambulatory referral to Social Work  Follow up in 3 months Follow up with therapy   Shanna Cisco, NP 02/10/2020, 4:42 PM

## 2020-05-07 DIAGNOSIS — B182 Chronic viral hepatitis C: Secondary | ICD-10-CM | POA: Diagnosis present

## 2020-05-07 DIAGNOSIS — F1920 Other psychoactive substance dependence, uncomplicated: Secondary | ICD-10-CM | POA: Insufficient documentation

## 2020-05-10 ENCOUNTER — Encounter (HOSPITAL_COMMUNITY): Payer: Self-pay | Admitting: Psychiatry

## 2020-05-10 ENCOUNTER — Telehealth (INDEPENDENT_AMBULATORY_CARE_PROVIDER_SITE_OTHER): Payer: No Payment, Other | Admitting: Psychiatry

## 2020-05-10 ENCOUNTER — Other Ambulatory Visit: Payer: Self-pay

## 2020-05-10 DIAGNOSIS — F411 Generalized anxiety disorder: Secondary | ICD-10-CM

## 2020-05-10 DIAGNOSIS — F3130 Bipolar disorder, current episode depressed, mild or moderate severity, unspecified: Secondary | ICD-10-CM

## 2020-05-10 MED ORDER — BUPROPION HCL ER (XL) 150 MG PO TB24: 150.0000 mg | ORAL_TABLET | ORAL | 2 refills | Status: DC

## 2020-05-10 MED ORDER — QUETIAPINE FUMARATE 50 MG PO TABS
50.0000 mg | ORAL_TABLET | Freq: Every day | ORAL | 2 refills | Status: DC
Start: 2020-05-10 — End: 2020-06-03

## 2020-05-10 MED ORDER — DIVALPROEX SODIUM 500 MG PO DR TAB: 500.0000 mg | DELAYED_RELEASE_TABLET | Freq: Two times a day (BID) | ORAL | 2 refills | Status: DC

## 2020-05-10 NOTE — Progress Notes (Signed)
BH MD/PA/NP OP Progress Note Virtual Visit via Telephone Note  I connected with Mary [name] on 05/10/20 at  2:00 PM EST by telephone and verified that I am speaking with the correct person using two identifiers.  Location: Patient: home Provider: Clinic   I discussed the limitations, risks, security and privacy concerns of performing an evaluation and management service by telephone and the availability of in person appointments. I also discussed with the patient that there may be a patient responsible charge related to this service. The patient expressed understanding and agreed to proceed.   I provided 10 minutes of non-face-to-face time during this encounter.   05/10/2020 2:04 PM Mary [name]  MRN:  [id number]  Chief Complaint: "My daughter just passed away, I need to talk but I can't right now"  HPI: 55 year old female seen today for follow up psychiatric evaluation. She has a psychiatric history of cocaine dependence (in remission), marijuana dependence (in remission), borderline personality disorder, schizophrenia, substance induced mood disorder, PTSD, and poly substance use. She is currently being managed on Wellbutrin XL 150 mg daily, Seroquel 50 mg daily, and Depakote 500 mg twice daily. She noted that her current medication regimen is effective in managing her psychiatric conditions.   Today she is unable to login virtually so the assessment was conducted over the telephone.  On exam she is pleasant, cooperative, and engaged in conversation.  She informed provider that she is out and notes that she cannot keep her appointment today.  She does tell provider that recently her daughter just passed away and she feels overwhelmed.  She notes her medications are effective in managing her psychiatric conditions however.  Today she denies SI/HI/VAH or paranoia.   Patient notes that she continues to work at [employer] site.  She also continues to maintain her sobriety.  No  medication changes made today. She is agreeable to continue all medications.  She will follow up with outpatient therapist for counseling.  Patient will follow up with provider in 1 month for further evaluation.  No other concerns noted at this time.  Visit Diagnosis:    ICD-10-CM   1. Bipolar I disorder, most recent episode depressed (HCC)  F31.30 QUEtiapine (SEROQUEL) 50 MG tablet    divalproex (DEPAKOTE) 500 MG DR tablet  2. GAD (generalized anxiety disorder)  F41.1 buPROPion (WELLBUTRIN XL) 150 MG 24 hr tablet    Past Psychiatric History: cocaine dependence (in remission), marijuana dependence (in remission), borderline, schizophrenia substance induced mood disorder, PTSD, and poly substance use  Past Medical History:  Past Medical History:  Diagnosis Date  . Bipolar depression (HCC)   . Borderline personality disorder (HCC)   . COPD (chronic obstructive pulmonary disease) (HCC)   . GERD (gastroesophageal reflux disease)   . Hiatal hernia   . History of alcohol abuse    per pt none since 10/ 2014  . History of attempted suicide   . History of drug abuse in remission (HCC)    per pt in remission since 12-26-2012  polysubstance dependence (alcohol, crack, cocaine, 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  . History of hepatitis C per pt first dx 2009 (approx)   consulted w/ dr comer (infectious disease) note 04-26-2016 , pt states was called and told per lab work no longer has hepatitis c   . History of kidney stones   . Hypercalcemia    mild and long-standing--- pt asymptomatic  . Primary hyperparathyroidism [facility])    endocrinology --  dr Lucianne Muss  . Pruritic erythematous rash    lower legs  . PTSD (post-traumatic stress disorder)   . Right ureteral stone   . Schizophrenia (HCC)   . Vitamin D deficiency     Past Surgical History:  Procedure Laterality Date  . CYSTOSCOPY N/A 01/10/2017   Procedure: CYSTOSCOPY;  Surgeon: Hermina Staggers, MD;  Location: [facility];  Service:  Gynecology;  Laterality: N/A;  . CYSTOSCOPY W/ URETERAL STENT PLACEMENT Right 07/14/2016   Procedure: CYSTOSCOPY WITH STENT REPLACEMENT;  Surgeon: Malen Gauze, MD;  Location: [facility];  Service: Urology;  Laterality: Right;  . CYSTOSCOPY WITH RETROGRADE PYELOGRAM, URETEROSCOPY AND STENT PLACEMENT Right 06/28/2016   Procedure: CYSTOSCOPY WITH RETROGRADE PYELOGRAM, URETEROSCOPY AND STENT PLACEMENT;  Surgeon: Malen Gauze, MD;  [facility];  Service: Urology;  Laterality: Right;  . CYSTOSCOPY/RETROGRADE/URETEROSCOPY/STONE EXTRACTION WITH BASKET Right 07/14/2016   Procedure: CYSTOSCOPY/RETROGRADE/URETEROSCOPY/STONE EXTRACTION WITH BASKET;  Surgeon: Malen Gauze, MD;  Location: [facility];  Service: Urology;  Laterality: Right;  . DIAGNOSTIC LAPAROSCOPIC LIVER BIOPSY N/A 02/10/2013   Procedure: DIAGNOSTIC LAPAROSCOPIC ;  Surgeon: Ardeth Sportsman, MD;  Location: [facility];  Service: General;  Laterality: N/A;  DIAGNOSTIC LAPAROSCOPY,laparoscopic ventral hernia repair with mesh lysis of adhesions  . HERNIA REPAIR    . HOLMIUM LASER APPLICATION Right 07/14/2016   Procedure: HOLMIUM LASER APPLICATION;  Surgeon: Malen Gauze, MD;  Location: [facility];  Service: Urology;  Laterality: Right;  . SPLENECTOMY  2004  . TUBAL LIGATION    . VAGINAL HYSTERECTOMY N/A 01/10/2017   Procedure: HYSTERECTOMY VAGINAL W/ BILATERAL SALPINGECTOMY WITH REPAIR OF INCIDENTAL CYSTOTOMY;  Surgeon: Hermina Staggers, MD;  Location: [facility];  Service: Gynecology;  Laterality: N/A;    Family Psychiatric History: Mother heroin use (over dosed and died), sister alcoholic, sister cocaine abuse, and maternal grandmother alcohol use. Five children were born addicted to cocaine.  Family History:  Family History  Problem Relation Age of Onset  . Alcohol abuse Mother   . Alcohol abuse Father   . Cancer Maternal Grandmother        colon  . Diabetes Maternal  Grandmother   . Hypertension Maternal Grandmother   . Alcohol abuse Sister   . Thyroid disease Neg Hx     Social History:  Social History   Socioeconomic History  . Marital status: Single    Spouse name: Not on file  . Number of children: Not on file  . Years of education: Not on file  . Highest education level: Not on file  Occupational History  . Not on file  Tobacco Use  . Smoking status: Current Every Day Smoker    Packs/day: 1.00    Years: 32.00    Pack years: 32.00    Types: Cigarettes  . Smokeless tobacco: Never Used  Vaping Use  . Vaping Use: Never used  Substance and Sexual Activity  . Alcohol use: No    Comment: quit 12/2012  . Drug use: No    Types: "Crack" cocaine, Marijuana, Cocaine    Comment: per pt quit 12-26-2012  . Sexual activity: Yes    Birth control/protection: None  Other Topics Concern  . Not on file  Social History Narrative  . Not on file   Social Determinants of Health   Financial Resource Strain: Not on file  Food Insecurity: Not on file  Transportation Needs: Not on file  Physical Activity: Not on file  Stress: Not  on file  Social Connections: Not on file    Allergies: No Known Allergies  Metabolic Disorder Labs: No results found for: HGBA1C, MPG No results found for: PROLACTIN No results found for: CHOL, TRIG, HDL, CHOLHDL, VLDL, LDLCALC No results found for: TSH  Therapeutic Level Labs: No results found for: LITHIUM No results found for: VALPROATE No components found for:  CBMZ  Current Medications: Current Outpatient Medications  Medication Sig Dispense Refill  . albuterol (VENTOLIN HFA) 108 (90 Base) MCG/ACT inhaler Inhale 1-2 puffs into the lungs every 6 (six) hours as needed for wheezing or shortness of breath. 18 g 0  . buPROPion (WELLBUTRIN XL) 150 MG 24 hr tablet Take 1 tablet (150 mg total) by mouth every morning. 30 tablet 2  . divalproex (DEPAKOTE) 500 MG DR tablet Take 1 tablet (500 mg total) by mouth 2 (two)  times daily. 60 tablet 2  . fluconazole (DIFLUCAN) 150 MG tablet Take 1 tablet (150 mg total) by mouth daily. Take second dose 72 hours later if symptoms still persists. 2 tablet 0  . ibuprofen (ADVIL,MOTRIN) 800 MG tablet Take 1 tablet (800 mg total) by mouth 3 (three) times daily. 30 tablet 0  . metroNIDAZOLE (FLAGYL) 500 MG tablet Take 1 tablet (500 mg total) by mouth 2 (two) times daily. 14 tablet 0  . Multiple Vitamin (MULTIVITAMIN WITH MINERALS) TABS tablet Take 1 tablet by mouth daily.    . ondansetron (ZOFRAN ODT) 4 MG disintegrating tablet Take 1 tablet (4 mg total) by mouth every 8 (eight) hours as needed for nausea or vomiting. 15 tablet 0  . phenazopyridine (PYRIDIUM) 200 MG tablet Take 1 tablet (200 mg total) by mouth 3 (three) times daily. 6 tablet 0  . QUEtiapine (SEROQUEL) 50 MG tablet Take 1 tablet (50 mg total) by mouth at bedtime. 30 tablet 2   No current facility-administered medications for this visit.     Musculoskeletal: Strength & Muscle Tone: Unable to assess due to telephone visit Gait & Station: Unable to assess due to telephone visit Patient leans: N/A  Psychiatric Specialty Exam: Review of Systems  There were no vitals taken for this visit.There is no height or weight on file to calculate BMI.  General Appearance: Unable to assess due to telephone visit  Eye Contact:  Unable to assess due to telephone visit  Speech:  Clear and Coherent and Normal Rate  Volume:  Normal  Mood:  Anxious and Depressed  Affect:  Appropriate and Congruent  Thought Process:  Coherent, Goal Directed and Linear  Orientation:  Full (Time, Place, and Person)  Thought Content: WDL and Logical   Suicidal Thoughts:  No  Homicidal Thoughts:  No  Memory:  Immediate;   Good Recent;   Good Remote;   Good  Judgement:  Good  Insight:  Good  Psychomotor Activity:  Normal  Concentration:  Concentration: Good and Attention Span: Good  Recall:  Good  Fund of Knowledge: Good  Language:  Good  Akathisia:  No  Handed:  Right  AIMS (if indicated): Not done  Assets:  Communication Skills Desire for Improvement Financial Resources/Insurance Housing Social Support  ADL's:  Intact  Cognition: WNL  Sleep:  Good   Screenings: PHQ2-9   Flowsheet Row [facility]  PHQ-2 Total Score 6 0  PHQ-9 Total Score 15 --       Assessment and Plan: Patient  informed provider that she has been feeling anxious and depressed since she lost her daughter.  Patient notes that she has limited time to speak with provider however requested that her medications be filled.  No medication changes made today.  Patient agreeable to follow-up with provider in 1 month.  1. GAD (generalized anxiety disorder)  Continue- buPROPion (WELLBUTRIN XL) 150 MG 24 hr tablet; Take 1 tablet (150 mg total) by mouth every morning.  Dispense: 30 tablet; Refill: 2   2. Bipolar I disorder, most recent episode depressed (HCC)  Continue- divalproex (DEPAKOTE) 500 MG DR tablet; Take 1 tablet (500 mg total) by mouth 2 (two) times daily.  Dispense: 60 tablet; Refill: 2 Continue- QUEtiapine (SEROQUEL) 50 MG tablet; Take 1 tablet (50 mg total) by mouth at bedtime.  Dispense: 30 tablet; Refill: 2   Follow up in 1 months Follow up with therapy   Shanna Cisco, NP 05/10/2020, 2:04 PM

## 2020-06-03 ENCOUNTER — Other Ambulatory Visit: Payer: Self-pay | Admitting: Psychiatry

## 2020-06-03 ENCOUNTER — Other Ambulatory Visit: Payer: Self-pay

## 2020-06-03 ENCOUNTER — Encounter (HOSPITAL_COMMUNITY): Payer: Self-pay | Admitting: Psychiatry

## 2020-06-03 ENCOUNTER — Telehealth (INDEPENDENT_AMBULATORY_CARE_PROVIDER_SITE_OTHER): Payer: No Payment, Other | Admitting: Psychiatry

## 2020-06-03 DIAGNOSIS — F411 Generalized anxiety disorder: Secondary | ICD-10-CM

## 2020-06-03 DIAGNOSIS — F3130 Bipolar disorder, current episode depressed, mild or moderate severity, unspecified: Secondary | ICD-10-CM

## 2020-06-03 MED ORDER — BUPROPION HCL ER (XL) 150 MG PO TB24: 150.0000 mg | ORAL_TABLET | ORAL | 2 refills | Status: DC

## 2020-06-03 MED ORDER — DIVALPROEX SODIUM 500 MG PO DR TAB: 500.0000 mg | DELAYED_RELEASE_TABLET | Freq: Two times a day (BID) | ORAL | 2 refills | Status: DC

## 2020-06-03 MED ORDER — QUETIAPINE FUMARATE 50 MG PO TABS: 50.0000 mg | ORAL_TABLET | Freq: Every day | ORAL | 2 refills | Status: DC

## 2020-06-03 MED FILL — DIVALPROEX SOD DR 500 MG TA: 500 | 30 days supply | Qty: 60 | Fill #0

## 2020-06-03 MED FILL — QUETIAPINE FUMARATE 50 MG T: 50 | 30 days supply | Qty: 30 | Fill #0

## 2020-06-03 MED FILL — BUPROPION HCL XL 150 MG TAB: 150 | 30 days supply | Qty: 30 | Fill #0

## 2020-06-03 NOTE — Progress Notes (Signed)
BH MD/PA/NP OP Progress Note Virtual Visit via Telephone Note  I connected with Mary Furlongianne Glatfelter on 06/03/20 at  4:00 PM EDT by telephone and verified that I am speaking with the correct person using two identifiers.  Location: Patient: home Provider: Clinic   I discussed the limitations, risks, security and privacy concerns of performing an evaluation and management service by telephone and the availability of in person appointments. I also discussed with the patient that there may be a patient responsible charge related to this service. The patient expressed understanding and agreed to proceed.   I provided 10 minutes of non-face-to-face time during this encounter.   06/03/2020 4:22 PM Mary FurlongDianne Hafley  MRN:  [id number]  Chief Complaint: "Im unable to afford my Depakote and Wellbutrin"  HPI: 55 year old female seen today for follow up psychiatric evaluation. She has a psychiatric history of cocaine dependence (in remission), marijuana dependence (in remission), borderline personality disorder, schizophrenia, substance induced mood disorder, PTSD, and poly substance use. She is currently being managed on Wellbutrin XL 150 mg daily, Seroquel 50 mg daily, and Depakote 500 mg twice daily. She noted that her current medication regimen is effective in managing her psychiatric conditions.   Today she is unable to login virtually so the assessment was conducted over the telephone.  On exam she is pleasant, cooperative, and engaged in conversation.  She informed provider that she has had more anxiety and depression since the death of her daughter. She notes that she is trying to cope with her loss. Today provider conducted a GAD 7 and patient scored a 17. She notes that she worries about her bills, getting along with her new roommate, and not offending people when she has mood swings. She informed Clinical research associatewriter that recently she let a friend move in (as she was living ina hotel with her young child) and notes  she charges her friend 300 $ a month. She notes that she fears her bills will go up. She notes that since her daughter died she has been more irritable and moody. She denies other symptoms of mania. Provider also conducted a PHQ 9 and patient scored a 15.  Today she denies SI/HI/VAH or paranoia. She endorses adequate sleep and appetite.  Patient notes that she continues to work at [employer] site and reports she enjoys her job. She however notes she does not leave her home others than going to work and notes at times it bothers her. She reports that she has been sober from cocaine for 10 years but notes her family still chooses not to be close to her.  Provider encouraged patient to do something that she enjoys at least once a weeks such as taking her self to the movies or getting her nails done.   Patient notes that although she is more anxious and depressed it is situational and notes that her medications are effective in managing her psychiatric conditions. She however notes that she can not afford her Wellbutrin and Depakote. Provider informed patient that her medications maybe cheaper at [facility]. She endorsed understanding and requested that her medications be sent there. No medication changes made today. She is agreeable to continue all medications.  She will follow up with outpatient therapist for counseling.   No other concerns noted at this time.  Visit Diagnosis:    ICD-10-CM   1. GAD (generalized anxiety disorder)  F41.1 buPROPion (WELLBUTRIN XL) 150 MG 24 hr tablet  2. Bipolar I disorder, most recent episode  depressed (HCC)  F31.30 divalproex (DEPAKOTE) 500 MG DR tablet    QUEtiapine (SEROQUEL) 50 MG tablet    Past Psychiatric History: cocaine dependence (in remission), marijuana dependence (in remission), borderline, schizophrenia substance induced mood disorder, PTSD, and poly substance use  Past Medical History:  Past Medical History:  Diagnosis Date  .  Bipolar depression (HCC)   . Borderline personality disorder (HCC)   . COPD (chronic obstructive pulmonary disease) (HCC)   . GERD (gastroesophageal reflux disease)   . Hiatal hernia   . History of alcohol abuse    per pt none since 10/ 2014  . History of attempted suicide   . History of drug abuse in remission (HCC)    per pt in remission since 12-26-2012  polysubstance dependence (alcohol, crack, cocaine, 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  . History of hepatitis C per pt first dx 2009 (approx)   consulted w/ dr comer (infectious disease) note 04-26-2016 , pt states was called and told per lab work no longer has hepatitis c   . History of kidney stones   . Hypercalcemia    mild and long-standing--- pt asymptomatic  . Primary hyperparathyroidism [facility])    endocrinology --  dr Lucianne Muss  . Pruritic erythematous rash    lower legs  . PTSD (post-traumatic stress disorder)   . Right ureteral stone   . Schizophrenia (HCC)   . Vitamin D deficiency     Past Surgical History:  Procedure Laterality Date  . CYSTOSCOPY N/A 01/10/2017   Procedure: CYSTOSCOPY;  Surgeon: Hermina Staggers, MD;  Location: [facility];  Service: Gynecology;  Laterality: N/A;  . CYSTOSCOPY W/ URETERAL STENT PLACEMENT Right 07/14/2016   Procedure: CYSTOSCOPY WITH STENT REPLACEMENT;  Surgeon: Malen Gauze, MD;  Location: [facility];  Service: Urology;  Laterality: Right;  . CYSTOSCOPY WITH RETROGRADE PYELOGRAM, URETEROSCOPY AND STENT PLACEMENT Right 06/28/2016   Procedure: CYSTOSCOPY WITH RETROGRADE PYELOGRAM, URETEROSCOPY AND STENT PLACEMENT;  Surgeon: Malen Gauze, MD;  Location: [facility];  Service: Urology;  Laterality: Right;  . CYSTOSCOPY/RETROGRADE/URETEROSCOPY/STONE EXTRACTION WITH BASKET Right 07/14/2016   Procedure: CYSTOSCOPY/RETROGRADE/URETEROSCOPY/STONE EXTRACTION WITH BASKET;  Surgeon: Malen Gauze, MD;  Location: [facility];  Service: Urology;  Laterality: Right;   . DIAGNOSTIC LAPAROSCOPIC LIVER BIOPSY N/A 02/10/2013   Procedure: DIAGNOSTIC LAPAROSCOPIC ;  Surgeon: Ardeth Sportsman, MD;  Location: [facility];  Service: General;  Laterality: N/A;  DIAGNOSTIC LAPAROSCOPY,laparoscopic ventral hernia repair with mesh lysis of adhesions  . HERNIA REPAIR    . HOLMIUM LASER APPLICATION Right 07/14/2016   Procedure: HOLMIUM LASER APPLICATION;  Surgeon: Malen Gauze, MD;  Location: [facility];  Service: Urology;  Laterality: Right;  . SPLENECTOMY  2004  . TUBAL LIGATION    . VAGINAL HYSTERECTOMY N/A 01/10/2017   Procedure: HYSTERECTOMY VAGINAL W/ BILATERAL SALPINGECTOMY WITH REPAIR OF INCIDENTAL CYSTOTOMY;  Surgeon: Hermina Staggers, MD;  Location: [facility];  Service: Gynecology;  Laterality: N/A;    Family Psychiatric History: Mother heroin use (over dosed and died), sister alcoholic, sister cocaine abuse, and maternal grandmother alcohol use. Five children were born addicted to cocaine.  Family History:  Family History  Problem Relation Age of Onset  . Alcohol abuse Mother   . Alcohol abuse Father   . Cancer Maternal Grandmother        colon  . Diabetes Maternal Grandmother   . Hypertension Maternal Grandmother   . Alcohol abuse Sister   . Thyroid  disease Neg Hx     Social History:  Social History   Socioeconomic History  . Marital status: Single    Spouse name: Not on file  . Number of children: Not on file  . Years of education: Not on file  . Highest education level: Not on file  Occupational History  . Not on file  Tobacco Use  . Smoking status: Current Every Day Smoker    Packs/day: 1.00    Years: 32.00    Pack years: 32.00    Types: Cigarettes  . Smokeless tobacco: Never Used  Vaping Use  . Vaping Use: Never used  Substance and Sexual Activity  . Alcohol use: No    Comment: quit 12/2012  . Drug use: No    Types: "Crack" cocaine, Marijuana, Cocaine    Comment: per pt quit 12-26-2012  . Sexual activity: Yes     Birth control/protection: None  Other Topics Concern  . Not on file  Social History Narrative  . Not on file   Social Determinants of Health   Financial Resource Strain: Not on file  Food Insecurity: Not on file  Transportation Needs: Not on file  Physical Activity: Not on file  Stress: Not on file  Social Connections: Not on file    Allergies: No Known Allergies  Metabolic Disorder Labs: No results found for: HGBA1C, MPG No results found for: PROLACTIN No results found for: CHOL, TRIG, HDL, CHOLHDL, VLDL, LDLCALC No results found for: TSH  Therapeutic Level Labs: No results found for: LITHIUM No results found for: VALPROATE No components found for:  CBMZ  Current Medications: Current Outpatient Medications  Medication Sig Dispense Refill  . albuterol (VENTOLIN HFA) 108 (90 Base) MCG/ACT inhaler Inhale 1-2 puffs into the lungs every 6 (six) hours as needed for wheezing or shortness of breath. 18 g 0  . buPROPion (WELLBUTRIN XL) 150 MG 24 hr tablet Take 1 tablet (150 mg total) by mouth every morning. 30 tablet 2  . divalproex (DEPAKOTE) 500 MG DR tablet Take 1 tablet (500 mg total) by mouth 2 (two) times daily. 60 tablet 2  . fluconazole (DIFLUCAN) 150 MG tablet Take 1 tablet (150 mg total) by mouth daily. Take second dose 72 hours later if symptoms still persists. 2 tablet 0  . ibuprofen (ADVIL,MOTRIN) 800 MG tablet Take 1 tablet (800 mg total) by mouth 3 (three) times daily. 30 tablet 0  . metroNIDAZOLE (FLAGYL) 500 MG tablet Take 1 tablet (500 mg total) by mouth 2 (two) times daily. 14 tablet 0  . Multiple Vitamin (MULTIVITAMIN WITH MINERALS) TABS tablet Take 1 tablet by mouth daily.    . ondansetron (ZOFRAN ODT) 4 MG disintegrating tablet Take 1 tablet (4 mg total) by mouth every 8 (eight) hours as needed for nausea or vomiting. 15 tablet 0  . phenazopyridine (PYRIDIUM) 200 MG tablet Take 1 tablet (200 mg total) by mouth 3 (three) times daily. 6 tablet 0  . QUEtiapine  (SEROQUEL) 50 MG tablet Take 1 tablet (50 mg total) by mouth at bedtime. 30 tablet 2   No current facility-administered medications for this visit.     Musculoskeletal: Strength & Muscle Tone: Unable to assess due to telephone visit Gait & Station: Unable to assess due to telephone visit Patient leans: N/A  Psychiatric Specialty Exam: Review of Systems  There were no vitals taken for this visit.There is no height or weight on file to calculate BMI.  General Appearance: Unable to assess due to telephone visit  Eye Contact:  Unable to assess due to telephone visit  Speech:  Clear and Coherent and Normal Rate  Volume:  Normal  Mood:  Anxious and Depressed  Affect:  Appropriate and Congruent  Thought Process:  Coherent, Goal Directed and Linear  Orientation:  Full (Time, Place, and Person)  Thought Content: WDL and Logical   Suicidal Thoughts:  No  Homicidal Thoughts:  No  Memory:  Immediate;   Good Recent;   Good Remote;   Good  Judgement:  Good  Insight:  Good  Psychomotor Activity:  Normal  Concentration:  Concentration: Good and Attention Span: Good  Recall:  Good  Fund of Knowledge: Good  Language: Good  Akathisia:  No  Handed:  Right  AIMS (if indicated): Not done  Assets:  Communication Skills Desire for Improvement Financial Resources/Insurance Housing Social Support  ADL's:  Intact  Cognition: WNL  Sleep:  Good   Screenings: GAD-7   Flowsheet Row Video Visit from 06/03/2020 in [facility]  Total GAD-7 Score 17    PHQ2-9   Flowsheet Row Video Visit from 06/03/2020 in [facility] Office Visit from 07/04/2016 in [facility] Office Visit from 04/26/2016 in [facility]  PHQ-2 Total Score 6 6 0  PHQ-9 Total Score 15 15 --    Flowsheet Row Video Visit from 06/03/2020 in [facility]  C-SSRS RISK CATEGORY No Risk        Assessment and Plan: Patient notes that her anxiety, depression, and irritability has worsened since her daughter died. Patient notes that although she is more anxious and depressed it is situational and notes that her medications are effective in managing her psychiatric conditions. She however notes that she can not afford her Wellbutrin and Depakote. Provider informed patient that her medications maybe cheaper at [facility]. She endorsed understanding and requested that her medications be sent there. No medication changes made today. Patient will continue all medications as prescribed.   1. GAD (generalized anxiety disorder)  Continue- buPROPion (WELLBUTRIN XL) 150 MG 24 hr tablet; Take 1 tablet (150 mg total) by mouth every morning.  Dispense: 30 tablet; Refill: 2   2. Bipolar I disorder, most recent episode depressed (HCC)  Continue- divalproex (DEPAKOTE) 500 MG DR tablet; Take 1 tablet (500 mg total) by mouth 2 (two) times daily.  Dispense: 60 tablet; Refill: 2 Continue- QUEtiapine (SEROQUEL) 50 MG tablet; Take 1 tablet (50 mg total) by mouth at bedtime.  Dispense: 30 tablet; Refill: 2   Follow up in 1 months Follow up with therapy   Shanna Cisco, NP 06/03/2020, 4:22 PM

## 2020-06-14 ENCOUNTER — Other Ambulatory Visit: Payer: Self-pay

## 2020-06-14 MED FILL — Quetiapine Fumarate Tab 50 MG: ORAL | 30 days supply | Qty: 30 | Fill #0 | Status: AC

## 2020-06-14 MED FILL — Divalproex Sodium Tab Delayed Release 500 MG: ORAL | 30 days supply | Qty: 60 | Fill #0 | Status: CN

## 2020-06-14 MED FILL — Bupropion HCl Tab ER 24HR 150 MG: ORAL | 30 days supply | Qty: 30 | Fill #0 | Status: AC

## 2020-07-16 ENCOUNTER — Other Ambulatory Visit: Payer: Self-pay

## 2020-07-16 MED FILL — Quetiapine Fumarate Tab 50 MG: ORAL | 30 days supply | Qty: 30 | Fill #1 | Status: AC

## 2020-07-16 MED FILL — Bupropion HCl Tab ER 24HR 150 MG: ORAL | 30 days supply | Qty: 30 | Fill #1 | Status: CN

## 2020-07-16 MED FILL — Divalproex Sodium Tab Delayed Release 500 MG: ORAL | 30 days supply | Qty: 60 | Fill #0 | Status: CN

## 2020-07-17 ENCOUNTER — Other Ambulatory Visit: Payer: Self-pay

## 2020-07-17 ENCOUNTER — Encounter (HOSPITAL_COMMUNITY): Payer: Self-pay | Admitting: Emergency Medicine

## 2020-07-17 ENCOUNTER — Emergency Department (HOSPITAL_COMMUNITY)
Admission: EM | Admit: 2020-07-17 | Discharge: 2020-07-17 | Disposition: A | Discharge status: Home / Self Care | Payer: No Typology Code available for payment source | Attending: Emergency Medicine | Admitting: Emergency Medicine

## 2020-07-17 DIAGNOSIS — R112 Nausea with vomiting, unspecified: Secondary | ICD-10-CM

## 2020-07-17 DIAGNOSIS — F17210 Nicotine dependence, cigarettes, uncomplicated: Secondary | ICD-10-CM | POA: Insufficient documentation

## 2020-07-17 DIAGNOSIS — R197 Diarrhea, unspecified: Secondary | ICD-10-CM | POA: Insufficient documentation

## 2020-07-17 DIAGNOSIS — J449 Chronic obstructive pulmonary disease, unspecified: Secondary | ICD-10-CM | POA: Insufficient documentation

## 2020-07-17 DIAGNOSIS — R1084 Generalized abdominal pain: Secondary | ICD-10-CM | POA: Insufficient documentation

## 2020-07-17 LAB — URINALYSIS, ROUTINE W REFLEX MICROSCOPIC
Bilirubin Urine: NEGATIVE
Glucose, UA: NEGATIVE mg/dL
Hgb urine dipstick: NEGATIVE
Ketones, ur: 20 mg/dL — AB
Leukocytes,Ua: NEGATIVE
Nitrite: NEGATIVE
Protein, ur: 30 mg/dL — AB
Specific Gravity, Urine: 1.026 (ref 1.005–1.030)
pH: 5 (ref 5.0–8.0)

## 2020-07-17 LAB — CBC
HCT: 42.4 % (ref 36.0–46.0)
Hemoglobin: 14.2 g/dL (ref 12.0–15.0)
MCH: 30.9 pg (ref 26.0–34.0)
MCHC: 33.5 g/dL (ref 30.0–36.0)
MCV: 92.2 fL (ref 80.0–100.0)
Platelets: 302 10*3/uL (ref 150–400)
RBC: 4.6 MIL/uL (ref 3.87–5.11)
RDW: 12.4 % (ref 11.5–15.5)
WBC: 8.5 10*3/uL (ref 4.0–10.5)
nRBC: 0 % (ref 0.0–0.2)

## 2020-07-17 LAB — COMPREHENSIVE METABOLIC PANEL
ALT: 16 U/L (ref 0–44)
AST: 21 U/L (ref 15–41)
Albumin: 4.2 g/dL (ref 3.5–5.0)
Alkaline Phosphatase: 89 U/L (ref 38–126)
Anion gap: 9 (ref 5–15)
BUN: 10 mg/dL (ref 6–20)
CO2: 23 mmol/L (ref 22–32)
Calcium: 10.8 mg/dL — ABNORMAL HIGH (ref 8.9–10.3)
Chloride: 105 mmol/L (ref 98–111)
Creatinine, Ser: 0.81 mg/dL (ref 0.44–1.00)
GFR, Estimated: >60 mL/min (ref >60)
Glucose, Bld: 108 mg/dL — ABNORMAL HIGH (ref 70–99)
Potassium: 3.9 mmol/L (ref 3.5–5.1)
Sodium: 137 mmol/L (ref 135–145)
Total Bilirubin: 0.7 mg/dL (ref 0.3–1.2)
Total Protein: 7.2 g/dL (ref 6.5–8.1)

## 2020-07-17 LAB — LIPASE, BLOOD: Lipase: 22 U/L (ref 11–51)

## 2020-07-17 LAB — I-STAT BETA HCG BLOOD, ED (MC, WL, AP ONLY): I-stat hCG, quantitative: <5 m[IU]/mL (ref <5)

## 2020-07-17 MED ORDER — METOCLOPRAMIDE HCL 5 MG/ML IJ SOLN
10.0000 mg | Freq: Once | INTRAMUSCULAR | Status: AC
  Administered 2020-07-17: 10 mg via INTRAVENOUS
  Filled 2020-07-17: qty 2

## 2020-07-17 MED ORDER — ONDANSETRON 4 MG PO TBDP: 4.0000 mg | ORAL_TABLET | Freq: Three times a day (TID) | ORAL | 0 refills | Status: DC | PRN

## 2020-07-17 MED ORDER — DICYCLOMINE HCL 10 MG/ML IM SOLN
20.0000 mg | Freq: Once | INTRAMUSCULAR | Status: AC
  Administered 2020-07-17: 20 mg via INTRAMUSCULAR
  Filled 2020-07-17: qty 2

## 2020-07-17 MED ORDER — FENTANYL CITRATE (PF) 100 MCG/2ML IJ SOLN
25.0000 ug | Freq: Once | INTRAMUSCULAR | Status: DC
  Filled 2020-07-17: qty 2

## 2020-07-17 MED ORDER — FAMOTIDINE IN NACL 20-0.9 MG/50ML-% IV SOLN
20.0000 mg | Freq: Once | INTRAVENOUS | Status: AC
  Administered 2020-07-17: 20 mg via INTRAVENOUS
  Filled 2020-07-17: qty 50

## 2020-07-17 MED ORDER — SODIUM CHLORIDE 0.9 % IV BOLUS
1000.0000 mL | Freq: Once | INTRAVENOUS | Status: AC
  Administered 2020-07-17: 1000 mL via INTRAVENOUS

## 2020-07-17 MED ORDER — ACETAMINOPHEN 325 MG PO TABS
650.0000 mg | ORAL_TABLET | Freq: Once | ORAL | Status: AC
  Administered 2020-07-17: 650 mg via ORAL
  Filled 2020-07-17: qty 2

## 2020-07-17 MED ORDER — DICYCLOMINE HCL 20 MG PO TABS: 20.0000 mg | ORAL_TABLET | Freq: Two times a day (BID) | ORAL | 0 refills | Status: DC

## 2020-07-17 NOTE — ED Triage Notes (Signed)
Patient arrived with EMS from home reports mid abdominal pain with nausea and emesis this week , no diarrhea or fever .

## 2020-07-17 NOTE — Discharge Instructions (Addendum)
Take medications to help with your symptoms. Make sure you are drinking plenty of fluids and slowly advancing your diet as you start to feel better over the next few days.  Avoid heavy, acidic, spicy foods or dairy. Follow-up with your primary care provider or the 1 listed below. Return to the ER if you start to experience worsening pain, continued vomiting despite medications, bloody stools, chest pain or shortness of breath

## 2020-07-17 NOTE — ED Provider Notes (Signed)
[facility] EMERGENCY DEPARTMENT Provider Note   CSN: [id number] Arrival date & time: 07/17/20  0211     History Chief Complaint  Patient presents with  . Abdominal Pain    Emesis    Mary [name] is a 55 y.o. female with a past medical history of prior kidney stones, COPD presenting to the ED with a chief complaint of abdominal pain.  States that for the past 3 to 4 days she has been having several episodes of nonbloody, nonbilious emesis and diarrhea.  She noticed that the symptoms went away slightly but then recurred yesterday.  Reports generalized abdominal pain and cramping that is worse when she vomits.  States that she tried to eat a bite of tilapia last night but was unable to keep it down.  She has not tried medications to help with her symptoms.  No sick contacts with similar symptoms.  No urinary symptoms, pelvic complaints, vaginal complaints.  Prior abdominal surgeries include hysterectomy and "some surgery on my spleen."  Denies any chest pain, shortness of breath, sick contacts with similar symptoms or suspicious food intake.  No recent antibiotic use  HPI     Past Medical History:  Diagnosis Date  . Bipolar depression (HCC)   . Borderline personality disorder (HCC)   . COPD (chronic obstructive pulmonary disease) (HCC)   . GERD (gastroesophageal reflux disease)   . Hiatal hernia   . History of alcohol abuse    per pt none since 10/ 2014  . History of attempted suicide   . History of drug abuse in remission (HCC)    per pt in remission since 12-26-2012  polysubstance dependence (alcohol, crack, cocaine, 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  . History of hepatitis C per pt first dx 2009 (approx)   consulted w/ dr comer (infectious disease) note 04-26-2016 , pt states was called and told per lab work no longer has hepatitis c   . History of kidney stones   . Hypercalcemia    mild and long-standing--- pt asymptomatic  . Primary  hyperparathyroidism [facility])    endocrinology --  dr Lucianne Muss  . Pruritic erythematous rash    lower legs  . PTSD (post-traumatic stress disorder)   . Right ureteral stone   . Schizophrenia (HCC)   . Vitamin D deficiency     Patient Active Problem List   Diagnosis Date Noted  . GAD (generalized anxiety disorder) 11/11/2019  . Bipolar I disorder, most recent episode depressed (HCC) 11/11/2019  . Post-operative state 01/10/2017  . Ureteral calculus 06/28/2016  . Pruritic erythematous rash 04/12/2016  . Reactive airway disease that is not asthma 04/12/2016  . Chronic hepatitis C without hepatic coma (HCC)   . Drug abuse and dependence (HCC)   . Incarcerated incisional hernia s/p lap repair 02/10/2013 02/10/2013  . Polysubstance dependence (HCC) 10/16/2011  . Substance induced mood disorder (HCC) 10/16/2011  . GERD without esophagitis 07/04/2011    Past Surgical History:  Procedure Laterality Date  . CYSTOSCOPY N/A 01/10/2017   Procedure: CYSTOSCOPY;  Surgeon: Hermina Staggers, MD;  Location: [facility];  Service: Gynecology;  Laterality: N/A;  . CYSTOSCOPY W/ URETERAL STENT PLACEMENT Right 07/14/2016   Procedure: CYSTOSCOPY WITH STENT REPLACEMENT;  Surgeon: Malen Gauze, MD;  Location: [place];  Service: Urology;  Laterality: Right;  . CYSTOSCOPY WITH RETROGRADE PYELOGRAM, URETEROSCOPY AND STENT PLACEMENT Right 06/28/2016   Procedure: CYSTOSCOPY WITH RETROGRADE PYELOGRAM, URETEROSCOPY AND STENT PLACEMENT;  Surgeon: Luisa Hart  Sandria Manly, MD;  Location: [facility];  Service: Urology;  Laterality: Right;  . CYSTOSCOPY/RETROGRADE/URETEROSCOPY/STONE EXTRACTION WITH BASKET Right 07/14/2016   Procedure: CYSTOSCOPY/RETROGRADE/URETEROSCOPY/STONE EXTRACTION WITH BASKET;  Surgeon: Malen Gauze, MD;  Location: [place];  Service: Urology;  Laterality: Right;  . DIAGNOSTIC LAPAROSCOPIC LIVER BIOPSY N/A 02/10/2013   Procedure: DIAGNOSTIC LAPAROSCOPIC ;  Surgeon: Ardeth Sportsman, MD;  Location: [facility];  Service: General;  Laterality: N/A;  DIAGNOSTIC LAPAROSCOPY,laparoscopic ventral hernia repair with mesh lysis of adhesions  . HERNIA REPAIR    . HOLMIUM LASER APPLICATION Right 07/14/2016   Procedure: HOLMIUM LASER APPLICATION;  Surgeon: Malen Gauze, MD;  Location: [facility];  Service: Urology;  Laterality: Right;  . SPLENECTOMY  2004  . TUBAL LIGATION    . VAGINAL HYSTERECTOMY N/A 01/10/2017   Procedure: HYSTERECTOMY VAGINAL W/ BILATERAL SALPINGECTOMY WITH REPAIR OF INCIDENTAL CYSTOTOMY;  Surgeon: Hermina Staggers, MD;  Location: [facility];  Service: Gynecology;  Laterality: N/A;     OB History    Gravida  5   Para      Term      Preterm      AB      Living  5     SAB      IAB      Ectopic      Multiple      Live Births  5           Family History  Problem Relation Age of Onset  . Alcohol abuse Mother   . Alcohol abuse Father   . Cancer Maternal Grandmother        colon  . Diabetes Maternal Grandmother   . Hypertension Maternal Grandmother   . Alcohol abuse Sister   . Thyroid disease Neg Hx     Social History   Tobacco Use  . Smoking status: Current Every Day Smoker    Packs/day: 1.00    Years: 32.00    Pack years: 32.00    Types: Cigarettes  . Smokeless tobacco: Never Used  Vaping Use  . Vaping Use: Never used  Substance Use Topics  . Alcohol use: No    Comment: quit 12/2012  . Drug use: No    Types: "Crack" cocaine, Marijuana, Cocaine    Comment: per pt quit 12-26-2012    Home Medications Prior to Admission medications   Medication Sig Start Date End Date Taking? Authorizing Provider  dicyclomine (BENTYL) 20 MG tablet Take 1 tablet (20 mg total) by mouth 2 (two) times daily. 07/17/20  Yes Delta Pichon, PA-C  ondansetron (ZOFRAN ODT) 4 MG disintegrating tablet Take 1 tablet (4 mg total) by mouth every 8 (eight) hours as needed for nausea or vomiting. 07/17/20  Yes Elanna Bert, PA-C   albuterol (VENTOLIN HFA) 108 (90 Base) MCG/ACT inhaler Inhale 1-2 puffs into the lungs every 6 (six) hours as needed for wheezing or shortness of breath. 07/25/19   Belinda Fisher, PA-C  buPROPion (WELLBUTRIN XL) 150 MG 24 hr tablet Take 1 tablet (150 mg total) by mouth every morning. 06/03/20   Toy Cookey E, NP  buPROPion (WELLBUTRIN XL) 150 MG 24 hr tablet TAKE 1 TABLET (150 MG TOTAL) BY MOUTH EVERY MORNING. 06/03/20 06/03/21  Toy Cookey E, NP  divalproex (DEPAKOTE) 500 MG DR tablet Take 1 tablet (500 mg total) by mouth 2 (two) times daily. 06/03/20   Shanna Cisco, NP  divalproex (DEPAKOTE) 500 MG DR tablet TAKE 1 TABLET (  500 MG TOTAL) BY MOUTH 2 (TWO) TIMES DAILY. 06/03/20 06/03/21  Shanna Cisco, NP  fluconazole (DIFLUCAN) 150 MG tablet Take 1 tablet (150 mg total) by mouth daily. Take second dose 72 hours later if symptoms still persists. 07/25/19   Cathie Hoops, Amy V, PA-C  ibuprofen (ADVIL,MOTRIN) 800 MG tablet Take 1 tablet (800 mg total) by mouth 3 (three) times daily. 02/14/17   Hermina Staggers, MD  metroNIDAZOLE (FLAGYL) 500 MG tablet Take 1 tablet (500 mg total) by mouth 2 (two) times daily. 07/25/19   Cathie Hoops, Amy V, PA-C  Multiple Vitamin (MULTIVITAMIN WITH MINERALS) TABS tablet Take 1 tablet by mouth daily.    [provider]  phenazopyridine (PYRIDIUM) 200 MG tablet Take 1 tablet (200 mg total) by mouth 3 (three) times daily. 10/12/17   Rasch, Victorino Dike I, NP  QUEtiapine (SEROQUEL) 50 MG tablet Take 1 tablet (50 mg total) by mouth at bedtime. 06/03/20   Shanna Cisco, NP  QUEtiapine (SEROQUEL) 50 MG tablet TAKE 1 TABLET (50 MG TOTAL) BY MOUTH AT BEDTIME. 06/03/20 06/03/21  Toy Cookey E, NP  fluticasone-salmeterol (ADVAIR HFA) 230-21 MCG/ACT inhaler Inhale 2 puffs into the lungs 2 (two) times daily. Rinse mouth after each use Patient not taking: Reported on 02/14/2017 07/04/16 07/25/19  Nche, Bonna Gains, NP    Allergies    Patient has no known allergies.  Review of  Systems   Review of Systems  Constitutional: Negative for appetite change, chills and fever.  HENT: Negative for ear pain, rhinorrhea, sneezing and sore throat.   Eyes: Negative for photophobia and visual disturbance.  Respiratory: Negative for cough, chest tightness, shortness of breath and wheezing.   Cardiovascular: Negative for chest pain and palpitations.  Gastrointestinal: Positive for abdominal pain, diarrhea, nausea and vomiting. Negative for blood in stool and constipation.  Genitourinary: Negative for dysuria, hematuria and urgency.  Musculoskeletal: Negative for myalgias.  Skin: Negative for rash.  Neurological: Negative for dizziness, weakness and light-headedness.    Physical Exam Updated Vital Signs BP 119/63 (BP Location: Right Arm)   Pulse (!) 56   Temp 97.9 F (36.6 C) (Oral)   Resp 18   LMP  (LMP Unknown)   SpO2 94%   Physical Exam Vitals and nursing note reviewed.  Constitutional:      General: She is not in acute distress.    Appearance: She is well-developed.  HENT:     Head: Normocephalic and atraumatic.     Nose: Nose normal.  Eyes:     General: No scleral icterus.       Left eye: No discharge.     Conjunctiva/sclera: Conjunctivae normal.  Cardiovascular:     Rate and Rhythm: Normal rate and regular rhythm.     Heart sounds: Normal heart sounds. No murmur heard. No friction rub. No gallop.   Pulmonary:     Effort: Pulmonary effort is normal. No respiratory distress.     Breath sounds: Normal breath sounds.  Abdominal:     General: Bowel sounds are normal. There is no distension.     Palpations: Abdomen is soft.     Tenderness: There is generalized abdominal tenderness. There is no guarding.  Musculoskeletal:        General: Normal range of motion.     Cervical back: Normal range of motion and neck supple.  Skin:    General: Skin is warm and dry.     Findings: No rash.  Neurological:     Mental Status: She  is alert.     Motor: No abnormal  muscle tone.     Coordination: Coordination normal.     ED Results / Procedures / Treatments   Labs (all labs ordered are listed, but only abnormal results are displayed) Labs Reviewed  COMPREHENSIVE METABOLIC PANEL - Abnormal; Notable for the following components:      Result Value   Glucose, Bld 108 (*)    Calcium 10.8 (*)    All other components within normal limits  URINALYSIS, ROUTINE W REFLEX MICROSCOPIC - Abnormal; Notable for the following components:   APPearance HAZY (*)    Ketones, ur 20 (*)    Protein, ur 30 (*)    Bacteria, UA FEW (*)    All other components within normal limits  LIPASE, BLOOD  CBC  I-STAT BETA HCG BLOOD, ED ([facility], [facility], [facility] ONLY)    EKG None  Radiology No results found.  Procedures Procedures   Medications Ordered in ED Medications  sodium chloride 0.9 % bolus 1,000 mL (1,000 mLs Intravenous New Bag/Given 07/17/20 0711)  metoCLOPramide (REGLAN) injection 10 mg (10 mg Intravenous Given 07/17/20 0711)  dicyclomine (BENTYL) injection 20 mg (20 mg Intramuscular Given 07/17/20 0711)  famotidine (PEPCID) IVPB 20 mg premix (0 mg Intravenous Stopped 07/17/20 0859)    ED Course  I have reviewed the triage vital signs and the nursing notes.  Pertinent labs & imaging results that were available during my care of the patient were reviewed by me and considered in my medical decision making (see chart for details).    MDM Rules/Calculators/A&P                          55 year old female presenting to the ED for generalized abdominal pain, vomiting and diarrhea for the past several days.  Symptoms slightly improved but then worsened.  She has not tried medications to help with her symptoms.  No urinary or pelvic complaints.  On exam patient with generalized abdominal tenderness without rebound or guarding.  No focal lower tenderness.  No respiratory symptoms or chest pain.  Vital signs within normal limits.  Lab work including CMP, CBC, lipase unremarkable.  [name]  attempt to treat symptoms and reassess.  Patient with significant improvement with medications given.  She is able to tolerate p.o. intake without difficulty.  I suspect that her symptoms are viral in nature.  Urinalysis without evidence of infection. I doubt her symptoms are due to appendicitis, cholecystitis or other surgical emergent cause based on her reassuring work-up, physical exam findings and improvement with medications.  [name] treat symptomatically at home with antiemetics and antispasmodics.  Patient is agreeable to the plan and advised to slowly advance her diet and stay hydrated.  Return precautions given peer   Patient is hemodynamically stable, in NAD, and able to ambulate in the ED. Evaluation does not show pathology that would require ongoing emergent intervention or inpatient treatment. I explained the diagnosis to the patient. Pain has been managed and has no complaints prior to discharge. Patient is comfortable with above plan and is stable for discharge at this time. All questions were answered prior to disposition. Strict return precautions for returning to the ED were discussed. Encouraged follow up with PCP.   An After Visit Summary was printed and given to the patient.   Portions of this note were generated with Scientist, clinical (histocompatibility and immunogenetics)Dragon dictation software. Dictation errors may occur despite best attempts at proofreading.  Final Clinical Impression(s) / ED  Diagnoses Final diagnoses:  Nausea vomiting and diarrhea    Rx / DC Orders ED Discharge Orders         Ordered    ondansetron (ZOFRAN ODT) 4 MG disintegrating tablet  Every 8 hours PRN        07/17/20 0953    dicyclomine (BENTYL) 20 MG tablet  2 times daily        07/17/20 0953           Dietrich Pates, PA-C 07/17/20 0459    Gwyneth Sprout, MD 07/18/20 [phone]

## 2020-07-19 ENCOUNTER — Other Ambulatory Visit: Payer: Self-pay

## 2020-08-19 ENCOUNTER — Other Ambulatory Visit: Payer: Self-pay

## 2020-08-19 MED FILL — Bupropion HCl Tab ER 24HR 150 MG: ORAL | 30 days supply | Qty: 30 | Fill #1 | Status: AC

## 2020-08-19 MED FILL — Quetiapine Fumarate Tab 50 MG: ORAL | 30 days supply | Qty: 30 | Fill #2 | Status: AC

## 2020-08-19 MED FILL — Divalproex Sodium Tab Delayed Release 500 MG: ORAL | 30 days supply | Qty: 60 | Fill #0 | Status: AC

## 2020-08-30 ENCOUNTER — Other Ambulatory Visit: Payer: Self-pay

## 2020-08-30 ENCOUNTER — Telehealth (HOSPITAL_COMMUNITY): Payer: BC Managed Care – PPO | Admitting: Psychiatry

## 2020-09-06 ENCOUNTER — Other Ambulatory Visit: Payer: Self-pay

## 2020-09-06 ENCOUNTER — Encounter (HOSPITAL_COMMUNITY): Payer: Self-pay | Admitting: Psychiatry

## 2020-09-06 ENCOUNTER — Telehealth (INDEPENDENT_AMBULATORY_CARE_PROVIDER_SITE_OTHER): Payer: No Payment, Other | Admitting: Psychiatry

## 2020-09-06 DIAGNOSIS — F3130 Bipolar disorder, current episode depressed, mild or moderate severity, unspecified: Secondary | ICD-10-CM | POA: Diagnosis not present

## 2020-09-06 MED ORDER — DIVALPROEX SODIUM 500 MG PO DR TAB
500.0000 mg | DELAYED_RELEASE_TABLET | Freq: Two times a day (BID) | ORAL | 2 refills | Status: DC
  Filled 2020-09-06 – 2020-09-17 (×2): qty 60, 30d supply, fill #0
  Filled 2020-11-16 – 2020-11-22 (×2): qty 60, 30d supply, fill #1

## 2020-09-06 MED ORDER — QUETIAPINE FUMARATE 50 MG PO TABS
50.0000 mg | ORAL_TABLET | Freq: Every day | ORAL | 2 refills | Status: DC
  Filled 2020-09-06 – 2020-09-17 (×2): qty 30, 30d supply, fill #0
  Filled 2020-10-15: qty 30, 30d supply, fill #1
  Filled 2020-11-16 – 2020-11-22 (×2): qty 30, 30d supply, fill #2

## 2020-09-06 MED ORDER — BUPROPION HCL ER (XL) 150 MG PO TB24
150.0000 mg | ORAL_TABLET | Freq: Every morning | ORAL | 2 refills | Status: DC
  Filled 2020-09-06 – 2020-09-17 (×2): qty 30, 30d supply, fill #0
  Filled 2020-11-16 – 2020-11-22 (×2): qty 30, 30d supply, fill #1

## 2020-09-06 NOTE — Progress Notes (Signed)
BH MD/PA/NP OP Progress Note Virtual Visit via Telephone Note  I connected with Mary [name] on 09/06/20 at  3:30 PM EDT by telephone and verified that I am speaking with the correct person using two identifiers.  Location: Patient: home Provider: Clinic   I discussed the limitations, risks, security and privacy concerns of performing an evaluation and management service by telephone and the availability of in person appointments. I also discussed with the patient that there may be a patient responsible charge related to this service. The patient expressed understanding and agreed to proceed.   I provided 10 minutes of non-face-to-face time during this encounter.   09/06/2020 4:41 PM Mary [name]  MRN:  [id number]  Chief Complaint: "Im stressed out about my apartment"  HPI: 55 year old female seen today for follow up psychiatric evaluation. She has a psychiatric history of cocaine dependence (in remission), marijuana dependence (in remission), borderline personality disorder, schizophrenia, substance induced mood disorder, PTSD, and poly substance use. She is currently being managed on Wellbutrin XL 150 mg daily, Seroquel 50 mg daily, and Depakote 500 mg twice daily. She noted that her  medications are effective in managing her psychiatric conditions.    Today she is unable to login virtually so the assessment was conducted over the telephone.  During  exam she is pleasant, cooperative, and engaged in conversation.  She informed provider that she is concerned about her living situation.  She notes that she is trying to move into income based housing however she was told that she may make too much money.  She also notes that she does not have a birth certificate and fears that this will be a problem when applying for future housing.  She informed Clinical research associate that she is unsure where she came from and does not have access to her birth certificate. Patient informed Clinical research associate that recently she started  working a second job at [employer] and still works at the [employer] site.  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  Patient informed writer that her anxiety and depression are well managed.  Today provider conducted a GAD-7 and patient scored a 6, at her last visit she scored a 17.  Provider also conducted a PHQ-9 and patient scored a 3, at her last visit she scored a 15.  She endorsed adequate sleep and appetite.  Today she denies SI/HI/VAH, mania, or paranoia.    Patient informed Clinical research associate that she had a friend and her young child staying with her.  She notes that these individuals no longer stay with her.  She informed Clinical research associate that her roommate would not clean up after her daughter so she asked her to leave.  She notes that her mood has been more stable since they have left.    Patient informed Clinical research associate that recently her insurance was approved.  She notes that she plans to follow-up with a PCP help manage other medical comorbidities.  She informed Clinical research associate that she believes she has issues with her GI system.  She notes that at times she is constipated and other times she has diarrhea.  No medication changes made today. She is agreeable to continue all medications.  She will follow up with outpatient therapist for counseling.   No other concerns noted at this time.  Visit Diagnosis:    ICD-10-CM   1. Bipolar I disorder, most recent episode depressed (HCC)  F31.30 buPROPion (WELLBUTRIN XL) 150 MG 24  hr tablet    divalproex (DEPAKOTE) 500 MG DR tablet    QUEtiapine (SEROQUEL) 50 MG tablet      Past Psychiatric History: cocaine dependence (in remission), marijuana dependence (in remission), borderline, schizophrenia substance induced mood disorder, PTSD, and poly substance use  Past Medical History:  Past Medical History:  Diagnosis Date   Bipolar depression (HCC)    Borderline personality disorder (HCC)    COPD (chronic  obstructive pulmonary disease) (HCC)    GERD (gastroesophageal reflux disease)    Hiatal hernia    History of alcohol abuse    per pt none since 10/ 2014   History of attempted suicide    History of drug abuse in remission (HCC)    per pt in remission since 12-26-2012  polysubstance dependence (alcohol, crack, cocaine, 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   History of hepatitis C per pt first dx 2009 (approx)   consulted w/ dr comer (infectious disease) note 04-26-2016 , pt states was called and told per lab work no longer has hepatitis c    History of kidney stones    Hypercalcemia    mild and long-standing--- pt asymptomatic   Primary hyperparathyroidism [facility])    endocrinology --  dr Lucianne Muss   Pruritic erythematous rash    lower legs   PTSD (post-traumatic stress disorder)    Right ureteral stone    Schizophrenia (HCC)    Vitamin D deficiency     Past Surgical History:  Procedure Laterality Date   CYSTOSCOPY N/A 01/10/2017   Procedure: CYSTOSCOPY;  Surgeon: Hermina Staggers, MD;  Location: [facility];  Service: Gynecology;  Laterality: N/A;   CYSTOSCOPY W/ URETERAL STENT PLACEMENT Right 07/14/2016   Procedure: CYSTOSCOPY WITH STENT REPLACEMENT;  Surgeon: Malen Gauze, MD;  Location: [facility];  Service: Urology;  Laterality: Right;   CYSTOSCOPY WITH RETROGRADE PYELOGRAM, URETEROSCOPY AND STENT PLACEMENT Right 06/28/2016   Procedure: CYSTOSCOPY WITH RETROGRADE PYELOGRAM, URETEROSCOPY AND STENT PLACEMENT;  Surgeon: Malen Gauze, MD;  Location: [facility];  Service: Urology;  Laterality: Right;   CYSTOSCOPY/RETROGRADE/URETEROSCOPY/STONE EXTRACTION WITH BASKET Right 07/14/2016   Procedure: CYSTOSCOPY/RETROGRADE/URETEROSCOPY/STONE EXTRACTION WITH BASKET;  Surgeon: Malen Gauze, MD;  Location: [facility];  Service: Urology;  Laterality: Right;   DIAGNOSTIC LAPAROSCOPIC LIVER BIOPSY N/A 02/10/2013   Procedure: DIAGNOSTIC LAPAROSCOPIC ;  Surgeon:  Ardeth Sportsman, MD;  Location: [facility];  Service: General;  Laterality: N/A;  DIAGNOSTIC LAPAROSCOPY,laparoscopic ventral hernia repair with mesh lysis of adhesions   HERNIA REPAIR     HOLMIUM LASER APPLICATION Right 07/14/2016   Procedure: HOLMIUM LASER APPLICATION;  Surgeon: Malen Gauze, MD;  Location: [facility];  Service: Urology;  Laterality: Right;   SPLENECTOMY  2004   TUBAL LIGATION     VAGINAL HYSTERECTOMY N/A 01/10/2017   Procedure: HYSTERECTOMY VAGINAL W/ BILATERAL SALPINGECTOMY WITH REPAIR OF INCIDENTAL CYSTOTOMY;  Surgeon: Hermina Staggers, MD;  Location: [facility];  Service: Gynecology;  Laterality: N/A;    Family Psychiatric History: Mother heroin use (over dosed and died), sister alcoholic, sister cocaine abuse, and maternal grandmother alcohol use. Five children were born addicted to cocaine.  Family History:  Family History  Problem Relation Age of Onset   Alcohol abuse Mother    Alcohol abuse Father    Cancer Maternal Grandmother        colon   Diabetes Maternal Grandmother    Hypertension Maternal Grandmother    Alcohol abuse Sister  Thyroid disease Neg Hx     Social History:  Social History   Socioeconomic History   Marital status: Single    Spouse name: Not on file   Number of children: Not on file   Years of education: Not on file   Highest education level: Not on file  Occupational History   Not on file  Tobacco Use   Smoking status: Every Day    Packs/day: 1.00    Years: 32.00    Pack years: 32.00    Types: Cigarettes   Smokeless tobacco: Never  Vaping Use   Vaping Use: Never used  Substance and Sexual Activity   Alcohol use: No    Comment: quit 12/2012   Drug use: No    Types: "Crack" cocaine, Marijuana, Cocaine    Comment: per pt quit 12-26-2012   Sexual activity: Yes    Birth control/protection: None  Other Topics Concern   Not on file  Social History Narrative   Not on file   Social Determinants of Health    Financial Resource Strain: Not on file  Food Insecurity: Not on file  Transportation Needs: Not on file  Physical Activity: Not on file  Stress: Not on file  Social Connections: Not on file    Allergies: No Known Allergies  Metabolic Disorder Labs: No results found for: HGBA1C, MPG No results found for: PROLACTIN No results found for: CHOL, TRIG, HDL, CHOLHDL, VLDL, LDLCALC No results found for: TSH  Therapeutic Level Labs: No results found for: LITHIUM No results found for: VALPROATE No components found for:  CBMZ  Current Medications: Current Outpatient Medications  Medication Sig Dispense Refill   albuterol (VENTOLIN HFA) 108 (90 Base) MCG/ACT inhaler Inhale 1-2 puffs into the lungs every 6 (six) hours as needed for wheezing or shortness of breath. 18 g 0   buPROPion (WELLBUTRIN XL) 150 MG 24 hr tablet TAKE 1 TABLET (150 MG TOTAL) BY MOUTH EVERY MORNING. 30 tablet 2   dicyclomine (BENTYL) 20 MG tablet Take 1 tablet (20 mg total) by mouth 2 (two) times daily. 9 tablet 0   divalproex (DEPAKOTE) 500 MG DR tablet Take 1 tablet (500 mg total) by mouth 2 (two) times daily. 60 tablet 2   fluconazole (DIFLUCAN) 150 MG tablet Take 1 tablet (150 mg total) by mouth daily. Take second dose 72 hours later if symptoms still persists. 2 tablet 0   ibuprofen (ADVIL,MOTRIN) 800 MG tablet Take 1 tablet (800 mg total) by mouth 3 (three) times daily. 30 tablet 0   metroNIDAZOLE (FLAGYL) 500 MG tablet Take 1 tablet (500 mg total) by mouth 2 (two) times daily. 14 tablet 0   Multiple Vitamin (MULTIVITAMIN WITH MINERALS) TABS tablet Take 1 tablet by mouth daily.     ondansetron (ZOFRAN ODT) 4 MG disintegrating tablet Take 1 tablet (4 mg total) by mouth every 8 (eight) hours as needed for nausea or vomiting. 4 tablet 0   phenazopyridine (PYRIDIUM) 200 MG tablet Take 1 tablet (200 mg total) by mouth 3 (three) times daily. 6 tablet 0   QUEtiapine (SEROQUEL) 50 MG tablet Take 1 tablet (50 mg total) by  mouth at bedtime. 30 tablet 2   No current facility-administered medications for this visit.     Musculoskeletal: Strength & Muscle Tone:  Unable to assess due to telephone visit Gait & Station:  Unable to assess due to telephone visit Patient leans: N/A  Psychiatric Specialty Exam: Review of Systems  There were no vitals taken for  this visit.There is no height or weight on file to calculate BMI.  General Appearance:  Unable to assess due to telephone visit  Eye Contact:   Unable to assess due to telephone visit  Speech:  Clear and Coherent and Normal Rate  Volume:  Normal  Mood:  Euthymic  Affect:  Appropriate and Congruent  Thought Process:  Coherent, Goal Directed and Linear  Orientation:  Full (Time, Place, and Person)  Thought Content: WDL and Logical   Suicidal Thoughts:  No  Homicidal Thoughts:  No  Memory:  Immediate;   Good Recent;   Good Remote;   Good  Judgement:  Good  Insight:  Good  Psychomotor Activity:  Normal  Concentration:  Concentration: Good and Attention Span: Good  Recall:  Good  Fund of Knowledge: Good  Language: Good  Akathisia:  No  Handed:  Right  AIMS (if indicated): Not done  Assets:  Communication Skills Desire for Improvement Financial Resources/Insurance Housing Social Support  ADL's:  Intact  Cognition: WNL  Sleep:  Good   Screenings: GAD-7    Flowsheet Row [facility]  Total GAD-7 Score 6 17      PHQ2-9    Flowsheet Row [facility]  PHQ-2 Total Score 0 6 6 0  PHQ-9 Total Score 3 15 15  --      Flow[facility]  C-SSRS RISK CATEGORY No Risk No Risk        Assessment and Plan: Patient notes that doing well on her current medication regimen.  No medication changes made today. Patient will continue all medications as prescribed.   1. GAD (generalized anxiety disorder)  Continue- buPROPion (WELLBUTRIN XL) 150 MG 24 hr tablet; Take 1 tablet (150 mg total) by mouth every morning.  Dispense: 30 tablet; Refill: 2   2. Bipolar I disorder, most recent episode depressed (HCC)  Continue- divalproex (DEPAKOTE) 500 MG DR tablet; Take 1 tablet (500 mg total) by mouth 2 (two) times daily.  Dispense: 60 tablet; Refill: 2 Continue- QUEtiapine (SEROQUEL) 50 MG tablet; Take 1 tablet (50 mg total) by mouth at bedtime.  Dispense: 30 tablet; Refill: 2   Follow up in 3 months Follow up with therapy   Shanna CiscoBrittney E Destan Franchini, NP 09/06/2020, 4:41 PM

## 2020-09-17 ENCOUNTER — Other Ambulatory Visit: Payer: Self-pay

## 2020-09-20 DIAGNOSIS — R519 Headache, unspecified: Secondary | ICD-10-CM | POA: Diagnosis not present

## 2020-09-20 DIAGNOSIS — Z130 Encounter for screening for diseases of the blood and blood-forming organs and certain disorders involving the immune mechanism: Secondary | ICD-10-CM | POA: Diagnosis not present

## 2020-09-20 DIAGNOSIS — Z131 Encounter for screening for diabetes mellitus: Secondary | ICD-10-CM | POA: Diagnosis not present

## 2020-09-20 DIAGNOSIS — Z13220 Encounter for screening for lipoid disorders: Secondary | ICD-10-CM | POA: Diagnosis not present

## 2020-09-20 DIAGNOSIS — F17200 Nicotine dependence, unspecified, uncomplicated: Secondary | ICD-10-CM | POA: Diagnosis not present

## 2020-09-20 DIAGNOSIS — Z0189 Encounter for other specified special examinations: Secondary | ICD-10-CM | POA: Diagnosis not present

## 2020-09-20 DIAGNOSIS — Z114 Encounter for screening for human immunodeficiency virus [HIV]: Secondary | ICD-10-CM | POA: Diagnosis not present

## 2020-09-20 DIAGNOSIS — Z113 Encounter for screening for infections with a predominantly sexual mode of transmission: Secondary | ICD-10-CM | POA: Diagnosis not present

## 2020-09-20 DIAGNOSIS — Z1329 Encounter for screening for other suspected endocrine disorder: Secondary | ICD-10-CM | POA: Diagnosis not present

## 2020-09-21 ENCOUNTER — Other Ambulatory Visit: Payer: Self-pay

## 2020-10-15 ENCOUNTER — Other Ambulatory Visit: Payer: Self-pay

## 2020-10-25 ENCOUNTER — Other Ambulatory Visit: Payer: Self-pay | Admitting: Family Medicine

## 2020-10-25 ENCOUNTER — Ambulatory Visit
Admission: RE | Admit: 2020-10-25 | Discharge: 2020-10-25 | Disposition: A | Discharge status: Home / Self Care | Payer: BC Managed Care – PPO | Source: Ambulatory Visit | Attending: Family Medicine | Admitting: Family Medicine

## 2020-10-25 ENCOUNTER — Other Ambulatory Visit: Payer: Self-pay

## 2020-10-25 DIAGNOSIS — F17200 Nicotine dependence, unspecified, uncomplicated: Secondary | ICD-10-CM

## 2020-10-25 DIAGNOSIS — R0600 Dyspnea, unspecified: Secondary | ICD-10-CM

## 2020-11-16 ENCOUNTER — Other Ambulatory Visit: Payer: Self-pay

## 2020-11-22 ENCOUNTER — Other Ambulatory Visit: Payer: Self-pay

## 2020-11-29 ENCOUNTER — Ambulatory Visit (INDEPENDENT_AMBULATORY_CARE_PROVIDER_SITE_OTHER): Payer: No Typology Code available for payment source | Admitting: Primary Care

## 2020-12-06 ENCOUNTER — Encounter (HOSPITAL_COMMUNITY): Payer: Self-pay | Admitting: Psychiatry

## 2020-12-06 ENCOUNTER — Other Ambulatory Visit: Payer: Self-pay

## 2020-12-06 ENCOUNTER — Telehealth (INDEPENDENT_AMBULATORY_CARE_PROVIDER_SITE_OTHER): Payer: BC Managed Care – PPO | Admitting: Psychiatry

## 2020-12-06 DIAGNOSIS — F3130 Bipolar disorder, current episode depressed, mild or moderate severity, unspecified: Secondary | ICD-10-CM | POA: Diagnosis not present

## 2020-12-06 DIAGNOSIS — F17200 Nicotine dependence, unspecified, uncomplicated: Secondary | ICD-10-CM | POA: Insufficient documentation

## 2020-12-06 MED ORDER — NICOTINE 21 MG/24HR TD PT24
21.0000 mg | MEDICATED_PATCH | Freq: Every day | TRANSDERMAL | 4 refills | Status: DC
  Filled 2020-12-06: qty 28, 28d supply, fill #0

## 2020-12-06 MED ORDER — BUPROPION HCL ER (XL) 150 MG PO TB24
150.0000 mg | ORAL_TABLET | Freq: Every morning | ORAL | 4 refills | Status: DC
  Filled 2020-12-06 – 2021-02-11 (×2): qty 30, 30d supply, fill #0

## 2020-12-06 MED ORDER — QUETIAPINE FUMARATE 50 MG PO TABS
50.0000 mg | ORAL_TABLET | Freq: Every day | ORAL | 4 refills | Status: DC
Start: 2020-12-06 — End: 2021-03-21
  Filled 2020-12-06 – 2020-12-22 (×2): qty 30, 30d supply, fill #0
  Filled 2021-02-11: qty 30, 30d supply, fill #1

## 2020-12-06 MED ORDER — DIVALPROEX SODIUM 500 MG PO DR TAB
500.0000 mg | DELAYED_RELEASE_TABLET | Freq: Two times a day (BID) | ORAL | 4 refills | Status: DC
Start: 2020-12-06 — End: 2021-07-11
  Filled 2020-12-06 – 2021-06-20 (×3): qty 60, 30d supply, fill #0

## 2020-12-06 NOTE — Progress Notes (Signed)
BH MD/PA/NP OP Progress Note Virtual Visit via Video Note  I connected with Mary [name] on 12/06/20 at  2:00 PM EDT by a video enabled telemedicine application and verified that I am speaking with the correct person using two identifiers.  Location: Patient: Home Provider: Clinic   I discussed the limitations of evaluation and management by telemedicine and the availability of in person appointments. The patient expressed understanding and agreed to proceed.  I provided 30 minutes of non-face-to-face time during this encounter.     12/06/2020 2:27 PM Mary [name]  MRN:  [id number]  Chief Complaint: "I have been working a lot"  HPI: 55 year old female seen today for follow up psychiatric evaluation. She has a psychiatric history of cocaine dependence (in remission), marijuana dependence (in remission), borderline personality disorder, schizophrenia, substance induced mood disorder, PTSD, and poly substance use. She is currently being managed on Wellbutrin XL 150 mg daily, Seroquel 50 mg daily, and Depakote 500 mg twice daily. She noted that her  medications are effective in managing her psychiatric conditions.    Today she well groomed, pleasant, cooperative, engaged in conversation, and maintained eye contact.  She informed provider that she has been working a lot. She notes that her rent doubled and is trying to make sure she remains financially stable. Patient notes that she is concerned about her irritability as she has a negative attitude with her peers. She notes that her mood fluctuates, impulsively spends, and endorses VH (noting that she sees shadows). She denies other symptoms of mania. Patient notes that she finds her medication effective however noted that she has been going though a lot. Besides finances she notes that recently she lost her grandson. She reports that he died on the anniversary of another grandson who passed a year ago.  Patient notes that the above worsens  her anxiety and depression. Provider conducted a GAD 7 and patient scored an 18, at her last visit she scored a 6. Provider also conducted a PHQ 9 and patient scored a 16, at her last visit she scored a 3.  She endorsed adequate sleep and appetite. She endorses passive SI but denies wanting to harm herself today. Today she denies SI/HI/AH. Patient notes at times she feels paranoid.  To cope she notes that she smokes 10 cigarettes a day.  Provider recommended increasing Seroquel to help manage anxiety and mood but patient notes that she would not like adjustments. She was agreeable to going to therapy and restarting Nicoderm CQ 21 mg patches to help with tobacco dependency. Potential side effects of medication and risks vs benefits of treatment vs non-treatment were explained and discussed. All questions were answered. She will continue all other medications as prescribed. No other concerns noted at this time.  Visit Diagnosis:    ICD-10-CM   1. Tobacco dependence  F17.200     2. Bipolar I disorder, most recent episode depressed (HCC)  F31.30 buPROPion (WELLBUTRIN XL) 150 MG 24 hr tablet    divalproex (DEPAKOTE) 500 MG DR tablet    QUEtiapine (SEROQUEL) 50 MG tablet    Ambulatory referral to Social Work      Past Psychiatric History: cocaine dependence (in remission), marijuana dependence (in remission), borderline, schizophrenia substance induced mood disorder, PTSD, and poly substance use  Past Medical History:  Past Medical History:  Diagnosis Date   Bipolar depression (HCC)    Borderline personality disorder (HCC)    COPD (chronic obstructive pulmonary disease) (HCC)    GERD (gastroesophageal  reflux disease)    Hiatal hernia    History of alcohol abuse    per pt none since 10/ 2014   History of attempted suicide    History of drug abuse in remission (HCC)    per pt in remission since 12-26-2012  polysubstance dependence (alcohol, crack, cocaine, cannubus)  per pt born w/ heroin  addiction   History of hepatitis C per pt first dx 2009 (approx)   consulted w/ dr comer (infectious disease) note 04-26-2016 , pt states was called and told per lab work no longer has hepatitis c    History of kidney stones    Hypercalcemia    mild and long-standing--- pt asymptomatic   Primary hyperparathyroidism [facility])    endocrinology --  dr Lucianne Muss   Pruritic erythematous rash    lower legs   PTSD (post-traumatic stress disorder)    Right ureteral stone    Schizophrenia (HCC)    Vitamin D deficiency     Past Surgical History:  Procedure Laterality Date   CYSTOSCOPY N/A 01/10/2017   Procedure: CYSTOSCOPY;  Surgeon: Hermina Staggers, MD;  Location: [facility];  Service: Gynecology;  Laterality: N/A;   CYSTOSCOPY W/ URETERAL STENT PLACEMENT Right 07/14/2016   Procedure: CYSTOSCOPY WITH STENT REPLACEMENT;  Surgeon: Malen Gauze, MD;  Location: [facility];  Service: Urology;  Laterality: Right;   CYSTOSCOPY WITH RETROGRADE PYELOGRAM, URETEROSCOPY AND STENT PLACEMENT Right 06/28/2016   Procedure: CYSTOSCOPY WITH RETROGRADE PYELOGRAM, URETEROSCOPY AND STENT PLACEMENT;  Surgeon: Malen Gauze, MD;  Location: [facility];  Service: Urology;  Laterality: Right;   CYSTOSCOPY/RETROGRADE/URETEROSCOPY/STONE EXTRACTION WITH BASKET Right 07/14/2016   Procedure: CYSTOSCOPY/RETROGRADE/URETEROSCOPY/STONE EXTRACTION WITH BASKET;  Surgeon: Malen Gauze, MD;  Location: [facility];  Service: Urology;  Laterality: Right;   DIAGNOSTIC LAPAROSCOPIC LIVER BIOPSY N/A 02/10/2013   Procedure: DIAGNOSTIC LAPAROSCOPIC ;  Surgeon: Ardeth Sportsman, MD;  Location: [facility];  Service: General;  Laterality: N/A;  DIAGNOSTIC LAPAROSCOPY,laparoscopic ventral hernia repair with mesh lysis of adhesions   HERNIA REPAIR     HOLMIUM LASER APPLICATION Right 07/14/2016   Procedure: HOLMIUM LASER APPLICATION;  Surgeon: Malen Gauze, MD;  Location: [facility];  Service: Urology;   Laterality: Right;   SPLENECTOMY  2004   TUBAL LIGATION     VAGINAL HYSTERECTOMY N/A 01/10/2017   Procedure: HYSTERECTOMY VAGINAL W/ BILATERAL SALPINGECTOMY WITH REPAIR OF INCIDENTAL CYSTOTOMY;  Surgeon: Hermina Staggers, MD;  Location: [facility];  Service: Gynecology;  Laterality: N/A;    Family Psychiatric History: Mother heroin use (over dosed and died), sister alcoholic, sister cocaine abuse, and maternal grandmother alcohol use. Five children were born addicted to cocaine.  Family History:  Family History  Problem Relation Age of Onset   Alcohol abuse Mother    Alcohol abuse Father    Cancer Maternal Grandmother        colon   Diabetes Maternal Grandmother    Hypertension Maternal Grandmother    Alcohol abuse Sister    Thyroid disease Neg Hx     Social History:  Social History   Socioeconomic History   Marital status: Single    Spouse name: Not on file   Number of children: Not on file   Years of education: Not on file   Highest education level: Not on file  Occupational History   Not on file  Tobacco Use   Smoking status: Every Day    Packs/day: 1.00    Years: 32.00  Pack years: 32.00    Types: Cigarettes   Smokeless tobacco: Never  Vaping Use   Vaping Use: Never used  Substance and Sexual Activity   Alcohol use: No    Comment: quit 12/2012   Drug use: No    Types: "Crack" cocaine, Marijuana, Cocaine    Comment: per pt quit 12-26-2012   Sexual activity: Yes    Birth control/protection: None  Other Topics Concern   Not on file  Social History Narrative   Not on file   Social Determinants of Health   Financial Resource Strain: Not on file  Food Insecurity: Not on file  Transportation Needs: Not on file  Physical Activity: Not on file  Stress: Not on file  Social Connections: Not on file    Allergies: No Known Allergies  Metabolic Disorder Labs: No results found for: HGBA1C, MPG No results found for: PROLACTIN No results found for: CHOL, TRIG,  HDL, CHOLHDL, VLDL, LDLCALC No results found for: TSH  Therapeutic Level Labs: No results found for: LITHIUM No results found for: VALPROATE No components found for:  CBMZ  Current Medications: Current Outpatient Medications  Medication Sig Dispense Refill   nicotine (NICODERM CQ) 21 mg/24hr patch Place 1 patch (21 mg total) onto the skin daily. 28 patch 4   albuterol (VENTOLIN HFA) 108 (90 Base) MCG/ACT inhaler Inhale 1-2 puffs into the lungs every 6 (six) hours as needed for wheezing or shortness of breath. 18 g 0   buPROPion (WELLBUTRIN XL) 150 MG 24 hr tablet TAKE 1 TABLET (150 MG TOTAL) BY MOUTH EVERY MORNING. 30 tablet 4   dicyclomine (BENTYL) 20 MG tablet Take 1 tablet (20 mg total) by mouth 2 (two) times daily. 9 tablet 0   divalproex (DEPAKOTE) 500 MG DR tablet Take 1 tablet (500 mg total) by mouth 2 (two) times daily. 60 tablet 4   fluconazole (DIFLUCAN) 150 MG tablet Take 1 tablet (150 mg total) by mouth daily. Take second dose 72 hours later if symptoms still persists. 2 tablet 0   ibuprofen (ADVIL,MOTRIN) 800 MG tablet Take 1 tablet (800 mg total) by mouth 3 (three) times daily. 30 tablet 0   metroNIDAZOLE (FLAGYL) 500 MG tablet Take 1 tablet (500 mg total) by mouth 2 (two) times daily. 14 tablet 0   Multiple Vitamin (MULTIVITAMIN WITH MINERALS) TABS tablet Take 1 tablet by mouth daily.     ondansetron (ZOFRAN ODT) 4 MG disintegrating tablet Take 1 tablet (4 mg total) by mouth every 8 (eight) hours as needed for nausea or vomiting. 4 tablet 0   phenazopyridine (PYRIDIUM) 200 MG tablet Take 1 tablet (200 mg total) by mouth 3 (three) times daily. 6 tablet 0   QUEtiapine (SEROQUEL) 50 MG tablet Take 1 tablet (50 mg total) by mouth at bedtime. 30 tablet 4   No current facility-administered medications for this visit.     Musculoskeletal: Strength & Muscle Tone:  Unable to assess due to telephone visit Gait & Station:  Unable to assess due to telephone visit Patient leans:  N/A  Psychiatric Specialty Exam: Review of Systems  There were no vitals taken for this visit.There is no height or weight on file to calculate BMI.  General Appearance:  Unable to assess due to telephone visit  Eye Contact:   Unable to assess due to telephone visit  Speech:  Clear and Coherent and Normal Rate  Volume:  Normal  Mood:  Euthymic  Affect:  Appropriate and Congruent  Thought Process:  Coherent,  Goal Directed and Linear  Orientation:  Full (Time, Place, and Person)  Thought Content: WDL and Logical   Suicidal Thoughts:  No  Homicidal Thoughts:  No  Memory:  Immediate;   Good Recent;   Good Remote;   Good  Judgement:  Good  Insight:  Good  Psychomotor Activity:  Normal  Concentration:  Concentration: Good and Attention Span: Good  Recall:  Good  Fund of Knowledge: Good  Language: Good  Akathisia:  No  Handed:  Right  AIMS (if indicated): Not done  Assets:  Communication Skills Desire for Improvement Financial Resources/Insurance Housing Social Support  ADL's:  Intact  Cognition: WNL  Sleep:  Good   Screenings: GAD-7    Flowsheet Row Video Visit from 12/06/2020 in [facility] Video Visit from 09/06/2020 in [facility] Video Visit from 06/03/2020 in [facility]  Total GAD-7 Score 18 6 17       PHQ2-9    Flowsheet Row Video Visit from 12/06/2020 in [facility] Video Visit from 09/06/2020 in [facility] Video Visit from 06/03/2020 in [facility] Office Visit from 07/04/2016 in [facility] Office Visit from 04/26/2016 in [facility]  PHQ-2 Total Score 4 0 6 6 0  PHQ-9 Total Score 16 3 15 15  --      Flowsheet Row Video Visit from 12/06/2020 in [facility] from 07/17/2020 in [facility]  EMERGENCY DEPARTMENT Video Visit from 06/03/2020 in [facility]  C-SSRS RISK CATEGORY Error: Q7 should not be populated when Q6 is No No Risk No Risk        Assessment and Plan: Patient endorses symptoms of anxiety, tobacco dependence, depression, and hypomania. Provider recommended increasing Seroquel to help manage anxiety and mood but patient notes that she would not like adjustments. She was agreeable to going to therapy and restarting Nicoderm CQ 21 mg patches to help with tobacco dependency.  1. Bipolar I disorder, most recent episode depressed (HCC)  Continue- buPROPion (WELLBUTRIN XL) 150 MG 24 hr tablet; TAKE 1 TABLET (150 MG TOTAL) BY MOUTH EVERY MORNING.  Dispense: 30 tablet; Refill: 4 Continue- divalproex (DEPAKOTE) 500 MG DR tablet; Take 1 tablet (500 mg total) by mouth 2 (two) times daily.  Dispense: 60 tablet; Refill: 4 Continue- QUEtiapine (SEROQUEL) 50 MG tablet; Take 1 tablet (50 mg total) by mouth at bedtime.  Dispense: 30 tablet; Refill: 4 - Ambulatory referral to Social Work   2. Tobacco dependence  Continue- buPROPion (WELLBUTRIN XL) 150 MG 24 hr tablet; TAKE 1 TABLET (150 MG TOTAL) BY MOUTH EVERY MORNING.  Dispense: 30 tablet; Refill: 4 Start- nicotine (NICODERM CQ) 21 mg/24hr patch; Place 1 patch (21 mg total) onto the skin daily.  Dispense: 28 patch; Refill: 4     Follow up in 3 months (patient notes that she can only be seen on Mondays after 1. Next app scheduled for Jan) Follow up with therapy   06-09-1974, NP 12/06/2020, 2:27 PM

## 2020-12-20 ENCOUNTER — Institutional Professional Consult (permissible substitution): Payer: No Typology Code available for payment source | Admitting: Pulmonary Disease

## 2020-12-21 DIAGNOSIS — R110 Nausea: Secondary | ICD-10-CM | POA: Diagnosis not present

## 2020-12-21 DIAGNOSIS — R059 Cough, unspecified: Secondary | ICD-10-CM | POA: Diagnosis not present

## 2020-12-21 DIAGNOSIS — R0600 Dyspnea, unspecified: Secondary | ICD-10-CM | POA: Diagnosis not present

## 2020-12-21 DIAGNOSIS — E785 Hyperlipidemia, unspecified: Secondary | ICD-10-CM | POA: Diagnosis not present

## 2020-12-21 DIAGNOSIS — Z13220 Encounter for screening for lipoid disorders: Secondary | ICD-10-CM | POA: Diagnosis not present

## 2020-12-21 DIAGNOSIS — R519 Headache, unspecified: Secondary | ICD-10-CM | POA: Diagnosis not present

## 2020-12-22 ENCOUNTER — Other Ambulatory Visit: Payer: Self-pay

## 2020-12-23 ENCOUNTER — Other Ambulatory Visit: Payer: Self-pay

## 2020-12-30 ENCOUNTER — Institutional Professional Consult (permissible substitution): Payer: No Typology Code available for payment source | Admitting: Internal Medicine

## 2021-01-27 ENCOUNTER — Ambulatory Visit (HOSPITAL_COMMUNITY): Payer: BC Managed Care – PPO | Admitting: Clinical

## 2021-01-31 ENCOUNTER — Ambulatory Visit (HOSPITAL_COMMUNITY): Payer: BC Managed Care – PPO | Admitting: Clinical

## 2021-02-08 DIAGNOSIS — Z113 Encounter for screening for infections with a predominantly sexual mode of transmission: Secondary | ICD-10-CM | POA: Diagnosis not present

## 2021-02-08 DIAGNOSIS — N760 Acute vaginitis: Secondary | ICD-10-CM | POA: Diagnosis not present

## 2021-02-08 DIAGNOSIS — Z114 Encounter for screening for human immunodeficiency virus [HIV]: Secondary | ICD-10-CM | POA: Diagnosis not present

## 2021-02-11 ENCOUNTER — Other Ambulatory Visit: Payer: Self-pay

## 2021-02-18 ENCOUNTER — Other Ambulatory Visit: Payer: Self-pay

## 2021-03-21 ENCOUNTER — Encounter (HOSPITAL_COMMUNITY): Payer: Self-pay | Admitting: Psychiatry

## 2021-03-21 ENCOUNTER — Telehealth (INDEPENDENT_AMBULATORY_CARE_PROVIDER_SITE_OTHER): Payer: BC Managed Care – PPO | Admitting: Psychiatry

## 2021-03-21 ENCOUNTER — Other Ambulatory Visit: Payer: Self-pay

## 2021-03-21 DIAGNOSIS — F411 Generalized anxiety disorder: Secondary | ICD-10-CM | POA: Diagnosis not present

## 2021-03-21 DIAGNOSIS — F17200 Nicotine dependence, unspecified, uncomplicated: Secondary | ICD-10-CM

## 2021-03-21 DIAGNOSIS — F3130 Bipolar disorder, current episode depressed, mild or moderate severity, unspecified: Secondary | ICD-10-CM | POA: Diagnosis not present

## 2021-03-21 MED ORDER — QUETIAPINE FUMARATE 100 MG PO TABS
100.0000 mg | ORAL_TABLET | Freq: Every day | ORAL | 4 refills | Status: DC
  Filled 2021-03-21 – 2021-04-13 (×2): qty 30, 30d supply, fill #0
  Filled 2021-06-20 – 2021-07-08 (×2): qty 30, 30d supply, fill #1

## 2021-03-21 MED ORDER — HYDROXYZINE HCL 10 MG PO TABS
10.0000 mg | ORAL_TABLET | Freq: Three times a day (TID) | ORAL | 4 refills | Status: DC | PRN
  Filled 2021-03-21: qty 90, 30d supply, fill #0

## 2021-03-21 MED ORDER — BUPROPION HCL ER (XL) 150 MG PO TB24
150.0000 mg | ORAL_TABLET | Freq: Every morning | ORAL | 4 refills | Status: DC
  Filled 2021-03-21 – 2021-04-13 (×2): qty 30, 30d supply, fill #0
  Filled 2021-06-20: qty 30, 30d supply, fill #1

## 2021-03-21 MED ORDER — NICOTINE 21 MG/24HR TD PT24
21.0000 mg | MEDICATED_PATCH | Freq: Every day | TRANSDERMAL | 4 refills | Status: DC
  Filled 2021-03-21: qty 28, 28d supply, fill #0
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
## 2021-03-25 ENCOUNTER — Other Ambulatory Visit: Payer: Self-pay

## 2021-04-13 ENCOUNTER — Other Ambulatory Visit: Payer: Self-pay

## 2021-04-14 ENCOUNTER — Other Ambulatory Visit: Payer: Self-pay

## 2021-05-24 ENCOUNTER — Telehealth (HOSPITAL_COMMUNITY): Payer: BC Managed Care – PPO | Admitting: Psychiatry

## 2021-05-30 ENCOUNTER — Ambulatory Visit (HOSPITAL_COMMUNITY): Payer: BC Managed Care – PPO | Admitting: Licensed Clinical Social Worker

## 2021-06-20 ENCOUNTER — Other Ambulatory Visit: Payer: Self-pay

## 2021-06-27 ENCOUNTER — Other Ambulatory Visit: Payer: Self-pay

## 2021-07-08 ENCOUNTER — Other Ambulatory Visit: Payer: Self-pay

## 2021-07-09 ENCOUNTER — Emergency Department (HOSPITAL_COMMUNITY)
Admission: EM | Admit: 2021-07-09 | Discharge: 2021-07-09 | Disposition: A | Discharge status: Home / Self Care | Payer: BC Managed Care – PPO | Attending: Emergency Medicine | Admitting: Emergency Medicine

## 2021-07-09 ENCOUNTER — Other Ambulatory Visit: Payer: Self-pay

## 2021-07-09 ENCOUNTER — Encounter (HOSPITAL_COMMUNITY): Payer: Self-pay

## 2021-07-09 DIAGNOSIS — J449 Chronic obstructive pulmonary disease, unspecified: Secondary | ICD-10-CM | POA: Diagnosis not present

## 2021-07-09 DIAGNOSIS — R112 Nausea with vomiting, unspecified: Secondary | ICD-10-CM | POA: Insufficient documentation

## 2021-07-09 DIAGNOSIS — Z7951 Long term (current) use of inhaled steroids: Secondary | ICD-10-CM | POA: Insufficient documentation

## 2021-07-09 DIAGNOSIS — T40715A Adverse effect of cannabis, initial encounter: Secondary | ICD-10-CM | POA: Insufficient documentation

## 2021-07-09 DIAGNOSIS — R110 Nausea: Secondary | ICD-10-CM | POA: Diagnosis not present

## 2021-07-09 DIAGNOSIS — F17210 Nicotine dependence, cigarettes, uncomplicated: Secondary | ICD-10-CM | POA: Insufficient documentation

## 2021-07-09 DIAGNOSIS — G4489 Other headache syndrome: Secondary | ICD-10-CM | POA: Diagnosis not present

## 2021-07-09 DIAGNOSIS — R1084 Generalized abdominal pain: Secondary | ICD-10-CM | POA: Diagnosis not present

## 2021-07-09 LAB — COMPREHENSIVE METABOLIC PANEL
ALT: 14 U/L (ref 0–44)
AST: 18 U/L (ref 15–41)
Albumin: 3.8 g/dL (ref 3.5–5.0)
Alkaline Phosphatase: 84 U/L (ref 38–126)
Anion gap: 4 — ABNORMAL LOW (ref 5–15)
BUN: 13 mg/dL (ref 6–20)
CO2: 24 mmol/L (ref 22–32)
Calcium: 10.2 mg/dL (ref 8.9–10.3)
Chloride: 111 mmol/L (ref 98–111)
Creatinine, Ser: 0.79 mg/dL (ref 0.44–1.00)
GFR, Estimated: >60 mL/min (ref >60)
Glucose, Bld: 116 mg/dL — ABNORMAL HIGH (ref 70–99)
Potassium: 3.3 mmol/L — ABNORMAL LOW (ref 3.5–5.1)
Sodium: 139 mmol/L (ref 135–145)
Total Bilirubin: 0.7 mg/dL (ref 0.3–1.2)
Total Protein: 7.1 g/dL (ref 6.5–8.1)

## 2021-07-09 LAB — CBC WITH DIFFERENTIAL/PLATELET
Abs Immature Granulocytes: 0.06 10*3/uL (ref 0.00–0.07)
Basophils Absolute: 0.1 10*3/uL (ref 0.0–0.1)
Basophils Relative: 0 %
Eosinophils Absolute: 0.2 10*3/uL (ref 0.0–0.5)
Eosinophils Relative: 2 %
HCT: 36.5 % (ref 36.0–46.0)
Hemoglobin: 12.6 g/dL (ref 12.0–15.0)
Immature Granulocytes: 0 %
Lymphocytes Relative: 26 %
Lymphs Abs: 3.5 10*3/uL (ref 0.7–4.0)
MCH: 32.5 pg (ref 26.0–34.0)
MCHC: 34.5 g/dL (ref 30.0–36.0)
MCV: 94.1 fL (ref 80.0–100.0)
Monocytes Absolute: 0.9 10*3/uL (ref 0.1–1.0)
Monocytes Relative: 7 %
Neutro Abs: 8.7 10*3/uL — ABNORMAL HIGH (ref 1.7–7.7)
Neutrophils Relative %: 65 %
Platelets: 300 10*3/uL (ref 150–400)
RBC: 3.88 MIL/uL (ref 3.87–5.11)
RDW: 12.5 % (ref 11.5–15.5)
WBC: 13.4 10*3/uL — ABNORMAL HIGH (ref 4.0–10.5)
nRBC: 0 % (ref 0.0–0.2)

## 2021-07-09 LAB — ETHANOL: Alcohol, Ethyl (B): <10 mg/dL (ref <10)

## 2021-07-09 MED ORDER — METOCLOPRAMIDE HCL 5 MG/ML IJ SOLN
10.0000 mg | Freq: Once | INTRAMUSCULAR | Status: AC
  Administered 2021-07-09: 10 mg via INTRAVENOUS
  Filled 2021-07-09: qty 2

## 2021-07-09 MED ORDER — SODIUM CHLORIDE 0.9 % IV BOLUS
1000.0000 mL | Freq: Once | INTRAVENOUS | Status: AC
  Administered 2021-07-09: 1000 mL via INTRAVENOUS

## 2021-07-09 MED ORDER — PANTOPRAZOLE SODIUM 40 MG IV SOLR
40.0000 mg | Freq: Once | INTRAVENOUS | Status: AC
  Administered 2021-07-09: 40 mg via INTRAVENOUS
  Filled 2021-07-09: qty 10

## 2021-07-09 MED ORDER — ONDANSETRON 8 MG PO TBDP: 8.0000 mg | ORAL_TABLET | Freq: Three times a day (TID) | ORAL | 0 refills | Status: DC | PRN

## 2021-07-09 NOTE — ED Triage Notes (Signed)
Pt. BIB [facility] c/o emesis for 30 mins. Per EMS, pt smoked marijuana at 12 am. About later, she began to vomit excessively and has been unable to keep anything down. EMS states that pt received marijuana from a new source and smoked a little more that she normally does.  ? ?EMS VS: ?HR: 82 ?BP:112/74 ?O2: 97% ?RR: 16 ?

## 2021-07-09 NOTE — ED Provider Notes (Signed)
? ?[facility] ?Provider Note: Lowella DellJ. Lane Corlis Angelica, MD, FACEP ? ?CSN: [id number] ?MRN: [id number] ?ARRIVAL: 07/09/21 at 0034 ?ROOM: [facility] ? ? ?CHIEF COMPLAINT  ?Vomiting ? ? ?HISTORY OF PRESENT ILLNESS  ?07/09/21 1:03 AM ?Mary [name] is a 56 y.o. female with a history of polysubstance abuse.  She is here with nausea and vomiting that began about 30 minutes prior to arrival.  This occurred about 30 minutes after smoking marijuana from a new source.  She admits to smoking more than she normally does.  She is having associated abdominal pain which she rates as a 6 out of 10.  The pain radiates up into her chest and is described as a burning sensation.  She has had no diarrhea with this.  She describes the vomiting as forceful. ? ? ?Past Medical History:  ?Diagnosis Date  ? Bipolar depression (HCC)   ? Borderline personality disorder (HCC)   ? COPD (chronic obstructive pulmonary disease) (HCC)   ? GERD (gastroesophageal reflux disease)   ? Hiatal hernia   ? History of alcohol abuse   ? per pt none since 10/ 2014  ? History of attempted suicide   ? History of drug abuse in remission [facility](HCC)   ? per pt in remission since 12-26-2012  polysubstance dependence (alcohol, crack, cocaine, 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  ? History of hepatitis C per pt first dx 2009 (approx)  ? consulted w/ dr comer (infectious disease) note 04-26-2016 , pt states was called and told per lab work no longer has hepatitis c   ? History of kidney stones   ? Hypercalcemia   ? mild and long-standing--- pt asymptomatic  ? Primary hyperparathyroidism (HCC)   ? endocrinology --  dr Lucianne Musskumar  ? Pruritic erythematous rash   ? lower legs  ? PTSD (post-traumatic stress disorder)   ? Right ureteral stone   ? Schizophrenia (HCC)   ? Vitamin D deficiency   ? ? ?Past Surgical History:  ?Procedure Laterality Date  ? CYSTOSCOPY N/A 01/10/2017  ? Procedure: CYSTOSCOPY;  Surgeon: Hermina StaggersErvin, Michael L, MD;  Location: [facility];  Service: Gynecology;   Laterality: N/A;  ? CYSTOSCOPY W/ URETERAL STENT PLACEMENT Right 07/14/2016  ? Procedure: CYSTOSCOPY WITH STENT REPLACEMENT;  Surgeon: Malen GauzeMcKenzie, Patrick L, MD;  [facility] [place];  Service: Urology;  Laterality: Right;  ? CYSTOSCOPY WITH RETROGRADE PYELOGRAM, URETEROSCOPY AND STENT PLACEMENT Right 06/28/2016  ? Procedure: CYSTOSCOPY WITH RETROGRADE PYELOGRAM, URETEROSCOPY AND STENT PLACEMENT;  Surgeon: Malen GauzePatrick L McKenzie, MD;  Location: [facility];  Service: Urology;  Laterality: Right;  ? CYSTOSCOPY/RETROGRADE/URETEROSCOPY/STONE EXTRACTION WITH BASKET Right 07/14/2016  ? Procedure: CYSTOSCOPY/RETROGRADE/URETEROSCOPY/STONE EXTRACTION WITH BASKET;  Surgeon: Malen GauzeMcKenzie, Patrick L, MD;  [facility] [place];  Service: Urology;  Laterality: Right;  ? DIAGNOSTIC LAPAROSCOPIC LIVER BIOPSY N/A 02/10/2013  ? Procedure: DIAGNOSTIC LAPAROSCOPIC ;  Surgeon: Ardeth SportsmanSteven C. Gross, MD;  Location: [facility];  Service: General;  Laterality: N/A;  DIAGNOSTIC LAPAROSCOPY,laparoscopic ventral hernia repair with mesh lysis of adhesions  ? HERNIA REPAIR    ? HOLMIUM LASER APPLICATION Right 07/14/2016  ? Procedure: HOLMIUM LASER APPLICATION;  Surgeon: Malen GauzeMcKenzie, Patrick L, MD;  [facility] [place];  Service: Urology;  Laterality: Right;  ? SPLENECTOMY  2004  ? TUBAL LIGATION    ? VAGINAL HYSTERECTOMY N/A 01/10/2017  ? Procedure: HYSTERECTOMY VAGINAL W/ BILATERAL SALPINGECTOMY WITH REPAIR OF INCIDENTAL CYSTOTOMY;  Surgeon: Hermina StaggersErvin, Michael L, MD;  Location: [facility];  Service: Gynecology;  Laterality: N/A;  ? ? ?  Family History  ?Problem Relation Age of Onset  ? Alcohol abuse Mother   ? Alcohol abuse Father   ? Cancer Maternal Grandmother   ?     colon  ? Diabetes Maternal Grandmother   ? Hypertension Maternal Grandmother   ? Alcohol abuse Sister   ? Thyroid disease Neg Hx   ? ? ?Social History  ? ?Tobacco Use  ? Smoking status: Every Day  ?  Packs/day: 1.00  ?  Years: 32.00  ?  Pack years: 32.00  ?  Types:  Cigarettes  ? Smokeless tobacco: Never  ?Vaping Use  ? Vaping Use: Never used  ?Substance Use Topics  ? Alcohol use: No  ?  Comment: quit 12/2012  ? Drug use: No  ?  Types: "Crack" cocaine, Marijuana, Cocaine  ?  Comment: per pt quit 12-26-2012  ? ? ?Prior to Admission medications   ?Medication Sig Start Date End Date Taking? Authorizing Provider  ?buPROPion (WELLBUTRIN XL) 150 MG 24 hr tablet TAKE 1 TABLET (150 MG TOTAL) BY MOUTH EVERY MORNING. 03/21/21  Yes Toy Cookey E, NP  ?divalproex (DEPAKOTE) 500 MG DR tablet Take 1 tablet (500 mg total) by mouth 2 (two) times daily. 12/06/20  Yes Toy Cookey E, NP  ?ondansetron (ZOFRAN-ODT) 8 MG disintegrating tablet Take 1 tablet (8 mg total) by mouth every 8 (eight) hours as needed. 07/09/21  Yes Naheim Burgen, MD  ?QUEtiapine (SEROQUEL) 100 MG tablet Take 1 tablet (100 mg total) by mouth at bedtime. 03/21/21  Yes Toy Cookey E, NP  ?fluticasone-salmeterol (ADVAIR HFA) 230-21 MCG/ACT inhaler Inhale 2 puffs into the lungs 2 (two) times daily. Rinse mouth after each use ?Patient not taking: Reported on 02/14/2017 07/04/16 07/25/19  Nche, Bonna Gains, NP  ? ? ?Allergies ?Patient has no known allergies. ? ? ?REVIEW OF SYSTEMS  ?Negative except as noted here or in the History of Present Illness. ? ? ?PHYSICAL EXAMINATION  ?Initial Vital Signs ?Blood pressure 98/60, pulse 82, temperature 97.7 ?F (36.5 ?C), temperature source Oral, resp. rate 20, SpO2 97 %. ? ?Examination ?General: Well-developed, well-nourished female in no acute distress; appearance consistent with age of record ?HENT: normocephalic; atraumatic ?Eyes: pupils equal, round and reactive to light; extraocular muscles intact ?Neck: supple ?Heart: regular rate and rhythm ?Lungs: clear to auscultation bilaterally ?Abdomen: soft; nondistended; mild periumbilical tenderness; bowel sounds hypoactive ?Extremities: No deformity; full range of motion; pulses normal ?Neurologic: Awake, alert and oriented; motor  function intact in all extremities and symmetric; no facial droop ?Skin: Warm and dry ?Psychiatric: Flat affect ? ? ?RESULTS  ?Summary of this visit's results, reviewed and interpreted by myself: ? ? EKG Interpretation ? ?Date/Time:    ?Ventricular Rate:    ?PR Interval:    ?QRS Duration:   ?QT Interval:    ?QTC Calculation:   ?R Axis:     ?Text Interpretation:   ?  ? ?  ? ?Laboratory Studies: ?Results for orders placed or performed during the hospital encounter of 07/09/21 (from the past 24 hour(s))  ?Comprehensive metabolic panel     Status: Abnormal  ? Collection Time: 07/09/21  1:20 AM  ?Result Value Ref Range  ? Sodium 139 135 - 145 mmol/L  ? Potassium 3.3 (L) 3.5 - 5.1 mmol/L  ? Chloride 111 98 - 111 mmol/L  ? CO2 24 22 - 32 mmol/L  ? Glucose, Bld 116 (H) 70 - 99 mg/dL  ? BUN 13 6 - 20 mg/dL  ? Creatinine, Ser 0.79 0.44 -  1.00 mg/dL  ? Calcium 10.2 8.9 - 10.3 mg/dL  ? Total Protein 7.1 6.5 - 8.1 g/dL  ? Albumin 3.8 3.5 - 5.0 g/dL  ? AST 18 15 - 41 U/L  ? ALT 14 0 - 44 U/L  ? Alkaline Phosphatase 84 38 - 126 U/L  ? Total Bilirubin 0.7 0.3 - 1.2 mg/dL  ? GFR, Estimated >60 >60 mL/min  ? Anion gap 4 (L) 5 - 15  ?CBC with Differential     Status: Abnormal  ? Collection Time: 07/09/21  1:20 AM  ?Result Value Ref Range  ? WBC 13.4 (H) 4.0 - 10.5 K/uL  ? RBC 3.88 3.87 - 5.11 MIL/uL  ? Hemoglobin 12.6 12.0 - 15.0 g/dL  ? HCT 36.5 36.0 - 46.0 %  ? MCV 94.1 80.0 - 100.0 fL  ? MCH 32.5 26.0 - 34.0 pg  ? MCHC 34.5 30.0 - 36.0 g/dL  ? RDW 12.5 11.5 - 15.5 %  ? Platelets 300 150 - 400 K/uL  ? nRBC 0.0 0.0 - 0.2 %  ? Neutrophils Relative % 65 %  ? Neutro Abs 8.7 (H) 1.7 - 7.7 K/uL  ? Lymphocytes Relative 26 %  ? Lymphs Abs 3.5 0.7 - 4.0 K/uL  ? Monocytes Relative 7 %  ? Monocytes Absolute 0.9 0.1 - 1.0 K/uL  ? Eosinophils Relative 2 %  ? Eosinophils Absolute 0.2 0.0 - 0.5 K/uL  ? Basophils Relative 0 %  ? Basophils Absolute 0.1 0.0 - 0.1 K/uL  ? Immature Granulocytes 0 %  ? Abs Immature Granulocytes 0.06 0.00 - 0.07 K/uL   ?Ethanol     Status: None  ? Collection Time: 07/09/21  1:20 AM  ?Result Value Ref Range  ? Alcohol, Ethyl (B) <10 <10 mg/dL  ? ?Imaging Studies: ?No results found. ? ?ED COURSE and MDM  ?Nursing notes, initial and subse

## 2021-07-11 ENCOUNTER — Telehealth (INDEPENDENT_AMBULATORY_CARE_PROVIDER_SITE_OTHER): Payer: BC Managed Care – PPO | Admitting: Psychiatry

## 2021-07-11 DIAGNOSIS — F3130 Bipolar disorder, current episode depressed, mild or moderate severity, unspecified: Secondary | ICD-10-CM | POA: Diagnosis not present

## 2021-07-11 DIAGNOSIS — F17200 Nicotine dependence, unspecified, uncomplicated: Secondary | ICD-10-CM

## 2021-07-11 MED ORDER — QUETIAPINE FUMARATE 50 MG PO TABS
50.0000 mg | ORAL_TABLET | Freq: Every day | ORAL | 2 refills | Status: DC
  Filled 2021-07-11 – 2021-08-02 (×2): qty 30, 30d supply, fill #0

## 2021-07-11 MED ORDER — DIVALPROEX SODIUM 500 MG PO DR TAB
500.0000 mg | DELAYED_RELEASE_TABLET | Freq: Two times a day (BID) | ORAL | 2 refills | Status: DC
  Filled 2021-07-11: qty 60, 30d supply, fill #0

## 2021-07-11 MED ORDER — BUPROPION HCL ER (XL) 150 MG PO TB24
150.0000 mg | ORAL_TABLET | Freq: Every morning | ORAL | 2 refills | Status: DC
  Filled 2021-07-11: qty 30, 30d supply, fill #0

## 2021-07-11 NOTE — Progress Notes (Signed)
BH MD/PA/NP OP Progress Note ? ?07/11/2021 5:58 PM ?Mary [name]  ?MRN:  [id number] ? ? ?Virtual Visit via Telephone Note ? ?I connected with Mary [name] on 07/11/21 at  1:30 PM EDT by telephone and verified that I am speaking with the correct person using two identifiers. ? ?Location: ?Patient: home ?Provider: offsite ?  ?I discussed the limitations, risks, security and privacy concerns of performing an evaluation and management service by telephone and the availability of in person appointments. I also discussed with the patient that there may be a patient responsible charge related to this service. The patient expressed understanding and agreed to proceed. ? ?  ?I discussed the assessment and treatment plan with the patient. The patient was provided an opportunity to ask questions and all were answered. The patient agreed with the plan and demonstrated an understanding of the instructions. ?  ?The patient was advised to call back or seek an in-person evaluation if the symptoms worsen or if the condition fails to improve as anticipated. ? ?I provided 10 minutes of non-face-to-face time during this encounter. ? ? ?Mcneil Sober, NP  ? ?Chief Complaint: Medication management ? ?HPI: Mary [name] is a 56 year old female presented to [facility] behavioral health outpatient for follow-up psychiatric evaluation.  Patient has a psychiatric history of bipolar disorder, generalized anxiety disorder polysubstance dependence and tobacco dependence.  Patient symptoms are managed with bupropion 150 mg every morning, Depakote 500 mg twice daily and Seroquel 100 mg at bedtime.  Patient reports medication compliance except for Depakote.  Patient states that she is unable to afford Depakote.  Patient in agreement with sending Depakote order to an alternate pharmacy for financial assistance.  Patient also reports feeling drowsy upon awakening in the mornings and believes it is due to Seroquel dose of 100 mg.  Medication  teaching conducted and extended release option of Seroquel offered but patient declined.  Patient is open to decreasing Seroquel to 50 mg at bedtime.  Patient denies adverse medication effects.  Wellbutrin and Depakote refilled at current dosages and sent to patient's preferred pharmacy for financial assistance.  Seroquel decreased to 50 mg in sent to patient's preferred pharmacy. ? ?Patient reports dissolving an intimate relationship recently which is causing her emotional upset.  Patient request to speak with a therapist for symptom management during this time of emotional upset.  Patient denies suicidal or homicidal ideations, paranoia, delusional thought, auditory or visual hallucinations. ? ?Visit Diagnosis:  ?  ICD-10-CM   ?1. Bipolar I disorder, most recent episode depressed (HCC)  F31.30 QUEtiapine (SEROQUEL) 50 MG tablet  ?  divalproex (DEPAKOTE) 500 MG DR tablet  ?  buPROPion (WELLBUTRIN XL) 150 MG 24 hr tablet  ?  ?2. Tobacco dependence  F17.200 buPROPion (WELLBUTRIN XL) 150 MG 24 hr tablet  ?  ? ? ?Past Psychiatric History:  ? ?Past Medical History:  ?Past Medical History:  ?Diagnosis Date  ? Bipolar depression (HCC)   ? Borderline personality disorder (HCC)   ? COPD (chronic obstructive pulmonary disease) (HCC)   ? GERD (gastroesophageal reflux disease)   ? Hiatal hernia   ? History of alcohol abuse   ? per pt none since 10/ 2014  ? History of attempted suicide   ? History of drug abuse in remission [facility])   ? per pt in remission since 12-26-2012  polysubstance dependence (alcohol, crack, cocaine, 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  ? History of hepatitis C per pt first dx 2009 (approx)  ?  consulted w/ dr comer (infectious disease) note 04-26-2016 , pt states was called and told per lab work no longer has hepatitis c   ? History of kidney stones   ? Hypercalcemia   ? mild and long-standing--- pt asymptomatic  ? Primary hyperparathyroidism (HCC)   ? endocrinology --  dr Lucianne Musskumar  ? Pruritic  erythematous rash   ? lower legs  ? PTSD (post-traumatic stress disorder)   ? Right ureteral stone   ? Schizophrenia (HCC)   ? Vitamin D deficiency   ?  ?Past Surgical History:  ?Procedure Laterality Date  ? CYSTOSCOPY N/A 01/10/2017  ? Procedure: CYSTOSCOPY;  Surgeon: Hermina StaggersErvin, Michael L, MD;  Location: [facility];  Service: Gynecology;  Laterality: N/A;  ? CYSTOSCOPY W/ URETERAL STENT PLACEMENT Right 07/14/2016  ? Procedure: CYSTOSCOPY WITH STENT REPLACEMENT;  Surgeon: Malen GauzeMcKenzie, Patrick L, MD;  [facility] [place];  Service: Urology;  Laterality: Right;  ? CYSTOSCOPY WITH RETROGRADE PYELOGRAM, URETEROSCOPY AND STENT PLACEMENT Right 06/28/2016  ? Procedure: CYSTOSCOPY WITH RETROGRADE PYELOGRAM, URETEROSCOPY AND STENT PLACEMENT;  Surgeon: Malen GauzePatrick L McKenzie, MD;  Location: [facility];  Service: Urology;  Laterality: Right;  ? CYSTOSCOPY/RETROGRADE/URETEROSCOPY/STONE EXTRACTION WITH BASKET Right 07/14/2016  ? Procedure: CYSTOSCOPY/RETROGRADE/URETEROSCOPY/STONE EXTRACTION WITH BASKET;  Surgeon: Malen GauzeMcKenzie, Patrick L, MD;  [facility] [place];  Service: Urology;  Laterality: Right;  ? DIAGNOSTIC LAPAROSCOPIC LIVER BIOPSY N/A 02/10/2013  ? Procedure: DIAGNOSTIC LAPAROSCOPIC ;  Surgeon: Ardeth SportsmanSteven C. Gross, MD;  Location: [facility];  Service: General;  Laterality: N/A;  DIAGNOSTIC LAPAROSCOPY,laparoscopic ventral hernia repair with mesh lysis of adhesions  ? HERNIA REPAIR    ? HOLMIUM LASER APPLICATION Right 07/14/2016  ? Procedure: HOLMIUM LASER APPLICATION;  Surgeon: Malen GauzeMcKenzie, Patrick L, MD;  [facility] [place];  Service: Urology;  Laterality: Right;  ? SPLENECTOMY  2004  ? TUBAL LIGATION    ? VAGINAL HYSTERECTOMY N/A 01/10/2017  ? Procedure: HYSTERECTOMY VAGINAL W/ BILATERAL SALPINGECTOMY WITH REPAIR OF INCIDENTAL CYSTOTOMY;  Surgeon: Hermina StaggersErvin, Michael L, MD;  Location: [facility];  Service: Gynecology;  Laterality: N/A;  ? ? ?Family Psychiatric History:  ? ?Family History:  ?Family History  ?Problem  Relation Age of Onset  ? Alcohol abuse Mother   ? Alcohol abuse Father   ? Cancer Maternal Grandmother   ?     colon  ? Diabetes Maternal Grandmother   ? Hypertension Maternal Grandmother   ? Alcohol abuse Sister   ? Thyroid disease Neg Hx   ? ? ?Social History:  ?Social History  ? ?Socioeconomic History  ? Marital status: Single  ?  Spouse name: Not on file  ? Number of children: Not on file  ? Years of education: Not on file  ? Highest education level: Not on file  ?Occupational History  ? Not on file  ?Tobacco Use  ? Smoking status: Every Day  ?  Packs/day: 1.00  ?  Years: 32.00  ?  Pack years: 32.00  ?  Types: Cigarettes  ? Smokeless tobacco: Never  ?Vaping Use  ? Vaping Use: Never used  ?Substance and Sexual Activity  ? Alcohol use: No  ?  Comment: quit 12/2012  ? Drug use: No  ?  Types: "Crack" cocaine, Marijuana, Cocaine  ?  Comment: per pt quit 12-26-2012  ? Sexual activity: Yes  ?  Birth control/protection: None  ?Other Topics Concern  ? Not on file  ?Social History Narrative  ? Not on file  ? ?Social Determinants of Health  ? ?  Financial Resource Strain: Not on file  ?Food Insecurity: Not on file  ?Transportation Needs: Not on file  ?Physical Activity: Not on file  ?Stress: Not on file  ?Social Connections: Not on file  ? ? ?Allergies: No Known Allergies ? ?Metabolic Disorder Labs: ?No results found for: HGBA1C, MPG ?No results found for: PROLACTIN ?No results found for: CHOL, TRIG, HDL, CHOLHDL, VLDL, LDLCALC ?No results found for: TSH ? ?Therapeutic Level Labs: ?No results found for: LITHIUM ?No results found for: VALPROATE ?No components found for:  CBMZ ? ?Current Medications: ?Current Outpatient Medications  ?Medication Sig Dispense Refill  ? buPROPion (WELLBUTRIN XL) 150 MG 24 hr tablet TAKE 1 TABLET (150 MG TOTAL) BY MOUTH EVERY MORNING. 30 tablet 2  ? divalproex (DEPAKOTE) 500 MG DR tablet Take 1 tablet (500 mg total) by mouth 2 (two) times daily. 60 tablet 2  ? ondansetron (ZOFRAN-ODT) 8 MG  disintegrating tablet Take 1 tablet (8 mg total) by mouth every 8 (eight) hours as needed. 10 tablet 0  ? QUEtiapine (SEROQUEL) 50 MG tablet Take 1 tablet (50 mg total) by mouth at bedtime. 30 tablet 2  ? ?No current fac

## 2021-07-12 ENCOUNTER — Other Ambulatory Visit: Payer: Self-pay

## 2021-07-19 ENCOUNTER — Other Ambulatory Visit: Payer: Self-pay

## 2021-08-01 ENCOUNTER — Ambulatory Visit
Admission: EM | Admit: 2021-08-01 | Discharge: 2021-08-01 | Disposition: A | Discharge status: Home / Self Care | Payer: BC Managed Care – PPO | Attending: Family Medicine | Admitting: Family Medicine

## 2021-08-01 DIAGNOSIS — N760 Acute vaginitis: Secondary | ICD-10-CM | POA: Insufficient documentation

## 2021-08-01 MED ORDER — FLUCONAZOLE 150 MG PO TABS
150.0000 mg | ORAL_TABLET | ORAL | 0 refills | Status: DC
  Filled 2021-08-01: qty 2, 14d supply, fill #0

## 2021-08-01 MED ORDER — METRONIDAZOLE 500 MG PO TABS
500.0000 mg | ORAL_TABLET | Freq: Two times a day (BID) | ORAL | 0 refills | Status: DC
  Filled 2021-08-01: qty 14, 7d supply, fill #0

## 2021-08-01 NOTE — ED Triage Notes (Signed)
Patient presents to Urgent Care with complaints of vaginal smell since last week. Patient reports no other symptoms and no new partners but concerned she could have been exposed by her boyfriend.

## 2021-08-01 NOTE — ED Provider Notes (Signed)
[facility]    CSN: [id number] Arrival date & time: 08/01/21  1808      History   Chief Complaint Chief Complaint  Patient presents with   SEXUALLY TRANSMITTED DISEASE    HPI Mary [name] is a 56 y.o. female.   Presenting today with about 1 week history of vaginal odor.  Denies discharge, itching, rashes, lesions, pelvic or abdominal pain, dysuria, hematuria.  Long history of recurrent bacterial vaginosis and states this feels similar.  No known exposures to STIs but wanted to get testing.  Not trying anything over-the-counter for symptoms.   Past Medical History:  Diagnosis Date   Bipolar depression (HCC)    Borderline personality disorder (HCC)    COPD (chronic obstructive pulmonary disease) (HCC)    GERD (gastroesophageal reflux disease)    Hiatal hernia    History of alcohol abuse    per pt none since 10/ 2014   History of attempted suicide    History of drug abuse in remission (HCC)    per pt in remission since 12-26-2012  polysubstance dependence (alcohol, crack, cocaine, 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   History of hepatitis C per pt first dx 2009 (approx)   consulted w/ dr comer (infectious disease) note 04-26-2016 , pt states was called and told per lab work no longer has hepatitis c    History of kidney stones    Hypercalcemia    mild and long-standing--- pt asymptomatic   Primary hyperparathyroidism [place](HCC)    endocrinology --  dr Lucianne Musskumar   Pruritic erythematous rash    lower legs   PTSD (post-traumatic stress disorder)    Right ureteral stone    Schizophrenia (HCC)    Vitamin D deficiency     Patient Active Problem List   Diagnosis Date Noted   Tobacco dependence 12/06/2020   GAD (generalized anxiety disorder) 11/11/2019   Bipolar I disorder, most recent episode depressed (HCC) 11/11/2019   Post-operative state 01/10/2017   Ureteral calculus 06/28/2016   Pruritic erythematous rash 04/12/2016   Reactive airway disease that is  not asthma 04/12/2016   Chronic hepatitis C without hepatic coma (HCC)    Drug abuse and dependence (HCC)    Incarcerated incisional hernia s/p lap repair 02/10/2013 02/10/2013   Polysubstance dependence (HCC) 10/16/2011   Substance induced mood disorder (HCC) 10/16/2011   GERD without esophagitis 07/04/2011    Past Surgical History:  Procedure Laterality Date   CYSTOSCOPY N/A 01/10/2017   Procedure: CYSTOSCOPY;  Surgeon: Hermina StaggersErvin, Michael L, MD;  Location: [facility];  Service: Gynecology;  Laterality: N/A;   CYSTOSCOPY W/ URETERAL STENT PLACEMENT Right 07/14/2016   Procedure: CYSTOSCOPY WITH STENT REPLACEMENT;  Surgeon: Malen GauzeMcKenzie, Patrick L, MD;  [facility] [place];  Service: Urology;  Laterality: Right;   CYSTOSCOPY WITH RETROGRADE PYELOGRAM, URETEROSCOPY AND STENT PLACEMENT Right 06/28/2016   Procedure: CYSTOSCOPY WITH RETROGRADE PYELOGRAM, URETEROSCOPY AND STENT PLACEMENT;  Surgeon: Malen GauzePatrick L McKenzie, MD;  Location[facility];  Service: Urology;  Laterality: Right;   CYSTOSCOPY/RETROGRADE/URETEROSCOPY/STONE EXTRACTION WITH BASKET Right 07/14/2016   Procedure: CYSTOSCOPY/RETROGRADE/URETEROSCOPY/STONE EXTRACTION WITH BASKET;  Surgeon: Malen GauzeMcKenzie, Patrick L, MD;  [facility] [place];  Service: Urology;  Laterality: Right;   DIAGNOSTIC LAPAROSCOPIC LIVER BIOPSY N/A 02/10/2013   Procedure: DIAGNOSTIC LAPAROSCOPIC ;  Surgeon: Ardeth SportsmanSteven C. Gross, MD;  Location: [facility];  Service: General;  Laterality: N/A;  DIAGNOSTIC LAPAROSCOPY,laparoscopic ventral hernia repair with mesh lysis of adhesions   HERNIA REPAIR  HOLMIUM LASER APPLICATION Right 07/14/2016   Procedure: HOLMIUM LASER APPLICATION;  Surgeon: Malen Gauze, MD;  Location: [facility];  Service: Urology;  Laterality: Right;   SPLENECTOMY  2004   TUBAL LIGATION     VAGINAL HYSTERECTOMY N/A 01/10/2017   Procedure: HYSTERECTOMY VAGINAL W/ BILATERAL SALPINGECTOMY WITH REPAIR OF INCIDENTAL CYSTOTOMY;   Surgeon: Hermina Staggers, MD;  Location: [facility];  Service: Gynecology;  Laterality: N/A;    OB History     Gravida  5   Para      Term      Preterm      AB      Living  5      SAB      IAB      Ectopic      Multiple      Live Births  5            Home Medications    Prior to Admission medications   Medication Sig Start Date End Date Taking? Authorizing Provider  fluconazole (DIFLUCAN) 150 MG tablet Take 1 tablet (150 mg total) by mouth once a week. 08/01/21  Yes Particia Nearing, PA-C  metroNIDAZOLE (FLAGYL) 500 MG tablet Take 1 tablet (500 mg total) by mouth 2 (two) times daily. 08/01/21  Yes Particia Nearing, PA-C  buPROPion (WELLBUTRIN XL) 150 MG 24 hr tablet TAKE 1 TABLET (150 MG TOTAL) BY MOUTH EVERY MORNING. 07/11/21   Penn, Cranston Neighbor, NP  divalproex (DEPAKOTE) 500 MG DR tablet Take 1 tablet (500 mg total) by mouth 2 (two) times daily. 07/11/21   Penn, Cranston Neighbor, NP  ondansetron (ZOFRAN-ODT) 8 MG disintegrating tablet Take 1 tablet (8 mg total) by mouth every 8 (eight) hours as needed. 07/09/21   Molpus, John, MD  QUEtiapine (SEROQUEL) 50 MG tablet Take 1 tablet (50 mg total) by mouth at bedtime. 07/11/21   Penn, Cranston Neighbor, NP  fluticasone-salmeterol (ADVAIR HFA) 230-21 MCG/ACT inhaler Inhale 2 puffs into the lungs 2 (two) times daily. Rinse mouth after each use Patient not taking: Reported on 02/14/2017 07/04/16 07/25/19  Nche, Bonna Gains, NP    Family History Family History  Problem Relation Age of Onset   Alcohol abuse Mother    Alcohol abuse Father    Cancer Maternal Grandmother        colon   Diabetes Maternal Grandmother    Hypertension Maternal Grandmother    Alcohol abuse Sister    Thyroid disease Neg Hx     Social History Social History   Tobacco Use   Smoking status: Every Day    Packs/day: 1.00    Years: 32.00    Pack years: 32.00    Types: Cigarettes   Smokeless tobacco: Never  Vaping Use   Vaping Use: Never used  Substance Use  Topics   Alcohol use: No    Comment: quit 12/2012   Drug use: No    Types: "Crack" cocaine, Marijuana, Cocaine    Comment: per pt quit 12-26-2012 marijuana 2 times a week     Allergies   Patient has no known allergies.   Review of Systems Review of Systems Per HPI  Physical Exam Triage Vital Signs ED Triage Vitals  Enc Vitals Group     BP 08/01/21 1838 119/68     Pulse Rate 08/01/21 1838 62     Resp 08/01/21 1838 18     Temp 08/01/21 1838 97.9 F (36.6 C)     Temp Source 08/01/21 1838  Oral     SpO2 08/01/21 1838 95 %     Weight --      Height --      Head Circumference --      Peak Flow --      Pain Score 08/01/21 1836 0     Pain Loc --      Pain Edu? --      Excl. in GC? --    No data found.  Updated Vital Signs BP 119/68   Pulse 62   Temp 97.9 F (36.6 C) (Oral)   Resp 18   LMP  (LMP Unknown)   SpO2 95%   Visual Acuity Right Eye Distance:   Left Eye Distance:   Bilateral Distance:    Right Eye Near:   Left Eye Near:    Bilateral Near:     Physical Exam Vitals and nursing note reviewed.  Constitutional:      Appearance: Normal appearance. She is not ill-appearing.  HENT:     Head: Atraumatic.  Eyes:     Extraocular Movements: Extraocular movements intact.     Conjunctiva/sclera: Conjunctivae normal.  Cardiovascular:     Rate and Rhythm: Normal rate and regular rhythm.     Heart sounds: Normal heart sounds.  Pulmonary:     Effort: Pulmonary effort is normal.     Breath sounds: Normal breath sounds.  Abdominal:     General: Bowel sounds are normal. There is no distension.     Palpations: Abdomen is soft.     Tenderness: There is no abdominal tenderness. There is no right CVA tenderness, left CVA tenderness or guarding.  Genitourinary:    Comments: GU exam deferred, self swab performed Musculoskeletal:        General: Normal range of motion.     Cervical back: Normal range of motion and neck supple.  Skin:    General: Skin is warm and  dry.  Neurological:     Mental Status: She is alert and oriented to person, place, and time.  Psychiatric:        Mood and Affect: Mood normal.        Thought Content: Thought content normal.        Judgment: Judgment normal.   UC Treatments / Results  Labs (all labs ordered are listed, but only abnormal results are displayed) Labs Reviewed  CERVICOVAGINAL ANCILLARY ONLY    EKG   Radiology No results found.  Procedures Procedures (including critical care time)  Medications Ordered in UC Medications - No data to display  Initial Impression / Assessment and Plan / UC Course  I have reviewed the triage vital signs and the nursing notes.  Pertinent labs & imaging results that were available during my care of the patient were reviewed by me and considered in my medical decision making (see chart for details).     Vaginal swab pending, based on history will treat with metronidazole while waiting on vaginal swab results.  She states this medication usually gives her yeast infection so Diflucan sent as well.  Final Clinical Impressions(s) / UC Diagnoses   Final diagnoses:  Acute vaginitis   Discharge Instructions   None    ED Prescriptions     Medication Sig Dispense Auth. Provider   fluconazole (DIFLUCAN) 150 MG tablet Take 1 tablet (150 mg total) by mouth once a week. 2 tablet Particia Nearing, PA-C   metroNIDAZOLE (FLAGYL) 500 MG tablet Take 1 tablet (500 mg total) by mouth 2 (  two) times daily. 14 tablet Particia Nearing, [place]      PDMP not reviewed this encounter.   Particia Nearing, [place] 08/01/21 [phone]

## 2021-08-02 ENCOUNTER — Other Ambulatory Visit: Payer: Self-pay

## 2021-08-03 ENCOUNTER — Other Ambulatory Visit: Payer: Self-pay

## 2021-08-03 LAB — CERVICOVAGINAL ANCILLARY ONLY
Bacterial Vaginitis (gardnerella): NEGATIVE
Candida Glabrata: NEGATIVE
Candida Vaginitis: NEGATIVE
Chlamydia: NEGATIVE
Comment: NEGATIVE
Comment: NEGATIVE
Comment: NEGATIVE
Comment: NEGATIVE
Comment: NEGATIVE
Comment: NORMAL
Neisseria Gonorrhea: NEGATIVE
Trichomonas: NEGATIVE

## 2021-10-04 ENCOUNTER — Telehealth (HOSPITAL_COMMUNITY): Payer: No Payment, Other | Admitting: Psychiatry

## 2021-10-13 ENCOUNTER — Telehealth (INDEPENDENT_AMBULATORY_CARE_PROVIDER_SITE_OTHER): Payer: No Payment, Other | Admitting: Psychiatry

## 2021-10-13 ENCOUNTER — Other Ambulatory Visit: Payer: Self-pay

## 2021-10-13 ENCOUNTER — Encounter (HOSPITAL_COMMUNITY): Payer: Self-pay | Admitting: Psychiatry

## 2021-10-13 DIAGNOSIS — F3130 Bipolar disorder, current episode depressed, mild or moderate severity, unspecified: Secondary | ICD-10-CM

## 2021-10-13 DIAGNOSIS — F17200 Nicotine dependence, unspecified, uncomplicated: Secondary | ICD-10-CM

## 2021-10-13 MED ORDER — DIVALPROEX SODIUM 500 MG PO DR TAB
500.0000 mg | DELAYED_RELEASE_TABLET | Freq: Two times a day (BID) | ORAL | 3 refills | Status: DC
  Filled 2021-10-13 – 2021-10-21 (×2): qty 60, 30d supply, fill #0

## 2021-10-13 MED ORDER — QUETIAPINE FUMARATE 50 MG PO TABS
50.0000 mg | ORAL_TABLET | Freq: Every day | ORAL | 3 refills | Status: DC
  Filled 2021-10-13 – 2021-10-21 (×2): qty 30, 30d supply, fill #0
  Filled 2021-11-21: qty 30, 30d supply, fill #1
  Filled 2021-12-20: qty 30, 30d supply, fill #2

## 2021-10-13 MED ORDER — BUPROPION HCL ER (XL) 150 MG PO TB24
150.0000 mg | ORAL_TABLET | Freq: Every morning | ORAL | 3 refills | Status: DC
  Filled 2021-10-13 – 2021-10-21 (×2): qty 30, 30d supply, fill #0
  Filled 2021-11-21: qty 30, 30d supply, fill #1
  Filled 2021-12-20: qty 30, 30d supply, fill #2

## 2021-10-13 NOTE — Progress Notes (Signed)
BH MD/PA/NP OP Progress Note Virtual Visit via Telephone Note  I connected with Mary [name] on 10/13/21 at  2:30 PM EDT by telephone and verified that I am speaking with the correct person using two identifiers.  Location: Patient: Work Provider: Clinic   I discussed the limitations, risks, security and privacy concerns of performing an evaluation and management service by telephone and the availability of in person appointments. I also discussed with the patient that there may be a patient responsible charge related to this service. The patient expressed understanding and agreed to proceed.   I provided 30 minutes of non-face-to-face time during this encounter.      10/13/2021 2:52 PM [name]  MRN:  [id number]  Chief Complaint:   "I have not been able to afford my medications"  HPI: 56 year old female seen today for follow up psychiatric evaluation. She has a psychiatric history of cocaine dependence (in remission), marijuana dependence (in remission), borderline personality disorder, schizophrenia, substance induced mood disorder, PTSD, and poly substance use. She is currently being managed on Wellbutrin XL 150 mg daily, Seroquel 50 mg daily, and Depakote 500 mg twice daily.  She informed Clinical research associate that she has been unable to to afford her medications.    Today she was unable to logon virtually so assessment done over the phone.  During exam she was pleasant, cooperative, and engaged in conversation.  She informed Clinical research associate that she has been taking old prescriptions of her medications and she has not been able to afford them.  She did note that she recently got a promotion at her job and will be able to pick her medications up tomorrow.  She informed Clinical research associate that overall she feels mentally stable and notes that she does not want adjustment to medication.  Patient informed Clinical research associate that she was about to start her shift and was unable to do a GAD-7 or PHQ-9.  She does endorse adequate sleep  and appetite.  Today she denies SI/HI/VAH or mania.  She reports at times feeling paranoid about how she is doing in her new position.  No medication changes made today.  Patient agreeable to continue medication as prescribed.  No other concerns noted at this time.     Visit Diagnosis:    ICD-10-CM   1. Bipolar I disorder, most recent episode depressed (HCC)  F31.30 buPROPion (WELLBUTRIN XL) 150 MG 24 hr tablet    divalproex (DEPAKOTE) 500 MG DR tablet    QUEtiapine (SEROQUEL) 50 MG tablet    2. Tobacco dependence  F17.200 buPROPion (WELLBUTRIN XL) 150 MG 24 hr tablet      Past Psychiatric History: cocaine dependence (in remission), marijuana dependence (in remission), borderline, schizophrenia substance induced mood disorder, PTSD, and poly substance use  Past Medical History:  Past Medical History:  Diagnosis Date   Bipolar depression (HCC)    Borderline personality disorder (HCC)    COPD (chronic obstructive pulmonary disease) (HCC)    GERD (gastroesophageal reflux disease)    Hiatal hernia    History of alcohol abuse    per pt none since 10/ 2014   History of attempted suicide    History of drug abuse in remission (HCC)    per pt in remission since 12-26-2012  polysubstance dependence (alcohol, crack, cocaine, 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   History of hepatitis C per pt first dx 2009 (approx)   consulted w/ dr comer (infectious disease) note 04-26-2016 , pt states was called and told per  lab work no longer has hepatitis c    History of kidney stones    Hypercalcemia    mild and long-standing--- pt asymptomatic   Primary hyperparathyroidism [facility])    endocrinology --  dr Lucianne Muss   Pruritic erythematous rash    lower legs   PTSD (post-traumatic stress disorder)    Right ureteral stone    Schizophrenia (HCC)    Vitamin D deficiency     Past Surgical History:  Procedure Laterality Date   CYSTOSCOPY N/A 01/10/2017   Procedure: CYSTOSCOPY;  Surgeon: Hermina Staggers, MD;  Location: [facility];  Service: Gynecology;  Laterality: N/A;   CYSTOSCOPY W/ URETERAL STENT PLACEMENT Right 07/14/2016   Procedure: CYSTOSCOPY WITH STENT REPLACEMENT;  Surgeon: Malen Gauze, MD;  Location: [facility];  Service: Urology;  Laterality: Right;   CYSTOSCOPY WITH RETROGRADE PYELOGRAM, URETEROSCOPY AND STENT PLACEMENT Right 06/28/2016   Procedure: CYSTOSCOPY WITH RETROGRADE PYELOGRAM, URETEROSCOPY AND STENT PLACEMENT;  Surgeon: Malen Gauze, MD;  [facility];  Service: Urology;  Laterality: Right;   CYSTOSCOPY/RETROGRADE/URETEROSCOPY/STONE EXTRACTION WITH BASKET Right 07/14/2016   Procedure: CYSTOSCOPY/RETROGRADE/URETEROSCOPY/STONE EXTRACTION WITH BASKET;  Surgeon: Malen Gauze, MD;  Location: [facility];  Service: Urology;  Laterality: Right;   DIAGNOSTIC LAPAROSCOPIC LIVER BIOPSY N/A 02/10/2013   Procedure: DIAGNOSTIC LAPAROSCOPIC ;  Surgeon: Ardeth Sportsman, MD;  [facility];  Service: General;  Laterality: N/A;  DIAGNOSTIC LAPAROSCOPY,laparoscopic ventral hernia repair with mesh lysis of adhesions   HERNIA REPAIR     HOLMIUM LASER APPLICATION Right 07/14/2016   Procedure: HOLMIUM LASER APPLICATION;  Surgeon: Malen Gauze, MD;  Location: [facility];  Service: Urology;  Laterality: Right;   SPLENECTOMY  2004   TUBAL LIGATION     VAGINAL HYSTERECTOMY N/A 01/10/2017   Procedure: HYSTERECTOMY VAGINAL W/ BILATERAL SALPINGECTOMY WITH REPAIR OF INCIDENTAL CYSTOTOMY;  Surgeon: Hermina Staggers, MD;  Location[facility];  Service: Gynecology;  Laterality: N/A;    Family Psychiatric History: Mother heroin use (over dosed and died), sister alcoholic, sister cocaine abuse, and maternal grandmother alcohol use. Five children were born addicted to cocaine.  Family History:  Family History  Problem Relation Age of Onset   Alcohol abuse Mother    Alcohol abuse Father    Cancer Maternal Grandmother         colon   Diabetes Maternal Grandmother    Hypertension Maternal Grandmother    Alcohol abuse Sister    Thyroid disease Neg Hx     Social History:  Social History   Socioeconomic History   Marital status: Single    Spouse name: Not on file   Number of children: Not on file   Years of education: Not on file   Highest education level: Not on file  Occupational History   Not on file  Tobacco Use   Smoking status: Every Day    Packs/day: 1.00    Years: 32.00    Total pack years: 32.00    Types: Cigarettes   Smokeless tobacco: Never  Vaping Use   Vaping Use: Never used  Substance and Sexual Activity   Alcohol use: No    Comment: quit 12/2012   Drug use: No    Types: "Crack" cocaine, Marijuana, Cocaine    Comment: per pt quit 12-26-2012 marijuana 2 times a week   Sexual activity: Yes    Birth control/protection: None  Other Topics Concern   Not on file  Social History Narrative  Not on file   Social Determinants of Health   Financial Resource Strain: Not on file  Food Insecurity: Not on file  Transportation Needs: Not on file  Physical Activity: Not on file  Stress: Not on file  Social Connections: Not on file    Allergies: No Known Allergies  Metabolic Disorder Labs: No results found for: "HGBA1C", "MPG" No results found for: "PROLACTIN" No results found for: "CHOL", "TRIG", "HDL", "CHOLHDL", "VLDL", "LDLCALC" No results found for: "TSH"  Therapeutic Level Labs: No results found for: "LITHIUM" No results found for: "VALPROATE" No results found for: "CBMZ"  Current Medications: Current Outpatient Medications  Medication Sig Dispense Refill   buPROPion (WELLBUTRIN XL) 150 MG 24 hr tablet TAKE 1 TABLET (150 MG TOTAL) BY MOUTH EVERY MORNING. 30 tablet 3   divalproex (DEPAKOTE) 500 MG DR tablet Take 1 tablet (500 mg total) by mouth 2 (two) times daily. 60 tablet 3   fluconazole (DIFLUCAN) 150 MG tablet Take 1 tablet (150 mg total) by mouth once a week. 2  tablet 0   metroNIDAZOLE (FLAGYL) 500 MG tablet Take 1 tablet (500 mg total) by mouth 2 (two) times daily. 14 tablet 0   ondansetron (ZOFRAN-ODT) 8 MG disintegrating tablet Take 1 tablet (8 mg total) by mouth every 8 (eight) hours as needed. 10 tablet 0   QUEtiapine (SEROQUEL) 50 MG tablet Take 1 tablet (50 mg total) by mouth at bedtime. 30 tablet 3   No current facility-administered medications for this visit.     Musculoskeletal: Strength & Muscle Tone:  Unable to assess due to telephone visit Gait & Station:  Unable to assess due to telephone visit Patient leans: N/A  Psychiatric Specialty Exam: Review of Systems  There were no vitals taken for this visit.There is no height or weight on file to calculate BMI.  General Appearance:  Unable to assess due to telephone visit  Eye Contact:   Unable to assess due to telephone visit  Speech:  Clear and Coherent and Normal Rate  Volume:  Normal  Mood:  Euthymic  Affect:  Appropriate and Congruent  Thought Process:  Coherent, Goal Directed and Linear  Orientation:  Full (Time, Place, and Person)  Thought Content: WDL and Logical   Suicidal Thoughts:  No  Homicidal Thoughts:  No  Memory:  Immediate;   Good Recent;   Good Remote;   Good  Judgement:  Good  Insight:  Good  Psychomotor Activity:   Unable to assess due to telephone visit  Concentration:  Concentration: Good and Attention Span: Good  Recall:  Good  Fund of Knowledge: Good  Language: Good  Akathisia:   Unable to assess due to telephone visit  Handed:  Right  AIMS (if indicated): Not done  Assets:  Communication Skills Desire for Improvement Financial Resources/Insurance Housing Social Support  ADL's:  Intact  Cognition: WNL  Sleep:  Good   Screenings: GAD-7    Flowsheet Row Video Visit from 03/21/2021 in [facility] Video Visit from 12/06/2020 in [facility] Video Visit from 09/06/2020 in [facility] Video Visit from 06/03/2020 in [facility]  Total GAD-7 Score 21 18 6 17       PHQ2-9    Flowsheet Row Video Visit from 03/21/2021 in [facility] Video Visit from 12/06/2020 in [facility] Video Visit from 09/06/2020 in [facility] Video Visit from 06/03/2020 in [facility] Office Visit from 07/04/2016 in [facility]  PHQ-2 Total Score 6 4 0 6 6  PHQ-9 Total Score 21 16 3 15 15       Flowsheet Row ED from 08/01/2021 in [facility]  ED from 07/09/2021 in [facility] Video Visit from 03/21/2021 in [facility]  C-SSRS RISK CATEGORY No Risk No Risk Error: Q7 should not be populated when Q6 is No        Assessment and Plan: Patient reports that she feels stable on her current medication regimen.  No medication was made today.  Patient agreeable to continue medications as prescribed.  1. Bipolar I disorder, most recent episode depressed (HCC)  Continue- buPROPion (WELLBUTRIN XL) 150 MG 24 hr tablet; TAKE 1 TABLET (150 MG TOTAL) BY MOUTH EVERY MORNING.  Dispense: 30 tablet; Refill: 3 Continue- divalproex (DEPAKOTE) 500 MG DR tablet; Take 1 tablet (500 mg total) by mouth 2 (two) times daily.  Dispense: 60 tablet; Refill: 3 Continue- QUEtiapine (SEROQUEL) 50 MG tablet; Take 1 tablet (50 mg total) by mouth at bedtime.  Dispense: 30 tablet; Refill: 3  2. Tobacco dependence  Continue- buPROPion (WELLBUTRIN XL) 150 MG 24 hr tablet; TAKE 1 TABLET (150 MG TOTAL) BY MOUTH EVERY MORNING.  Dispense: 30 tablet; Refill: 3      Follow up in 3 months   Follow up with therapy   [facility], NP 10/13/2021, 2:52 PM

## 2021-10-20 ENCOUNTER — Other Ambulatory Visit: Payer: Self-pay

## 2021-10-21 ENCOUNTER — Other Ambulatory Visit: Payer: Self-pay

## 2021-11-21 ENCOUNTER — Other Ambulatory Visit: Payer: Self-pay

## 2021-12-20 ENCOUNTER — Other Ambulatory Visit: Payer: Self-pay

## 2021-12-20 ENCOUNTER — Other Ambulatory Visit (HOSPITAL_COMMUNITY): Payer: Self-pay | Admitting: Psychiatry

## 2021-12-20 DIAGNOSIS — F17200 Nicotine dependence, unspecified, uncomplicated: Secondary | ICD-10-CM

## 2021-12-20 MED ORDER — NICOTINE 21 MG/24HR TD PT24
21.0000 mg | MEDICATED_PATCH | Freq: Every day | TRANSDERMAL | 4 refills | Status: DC
  Filled 2021-12-20: qty 28, 28d supply, fill #0

## 2021-12-27 ENCOUNTER — Other Ambulatory Visit: Payer: Self-pay

## 2022-01-04 ENCOUNTER — Encounter (HOSPITAL_COMMUNITY): Payer: Self-pay | Admitting: Psychiatry

## 2022-01-04 ENCOUNTER — Telehealth (INDEPENDENT_AMBULATORY_CARE_PROVIDER_SITE_OTHER): Payer: No Payment, Other | Admitting: Psychiatry

## 2022-01-04 ENCOUNTER — Other Ambulatory Visit: Payer: Self-pay

## 2022-01-04 DIAGNOSIS — F3130 Bipolar disorder, current episode depressed, mild or moderate severity, unspecified: Secondary | ICD-10-CM | POA: Diagnosis not present

## 2022-01-04 DIAGNOSIS — F17200 Nicotine dependence, unspecified, uncomplicated: Secondary | ICD-10-CM | POA: Diagnosis not present

## 2022-01-04 MED ORDER — BUPROPION HCL ER (XL) 150 MG PO TB24
150.0000 mg | ORAL_TABLET | Freq: Every morning | ORAL | 3 refills | Status: DC
  Filled 2022-01-04 – 2022-02-21 (×2): qty 30, 30d supply, fill #0

## 2022-01-04 MED ORDER — DIVALPROEX SODIUM 500 MG PO DR TAB
500.0000 mg | DELAYED_RELEASE_TABLET | Freq: Two times a day (BID) | ORAL | 3 refills | Status: DC
  Filled 2022-01-04: qty 60, 30d supply, fill #0
  Filled 2022-02-21: qty 60, 30d supply, fill #1

## 2022-01-04 MED ORDER — QUETIAPINE FUMARATE 50 MG PO TABS
50.0000 mg | ORAL_TABLET | Freq: Every day | ORAL | 3 refills | Status: DC
  Filled 2022-01-04 – 2022-01-20 (×2): qty 30, 30d supply, fill #0
  Filled 2022-02-21: qty 30, 30d supply, fill #1
  Filled 2022-03-21: qty 30, 30d supply, fill #2

## 2022-01-04 MED ORDER — NICOTINE 21 MG/24HR TD PT24
21.0000 mg | MEDICATED_PATCH | Freq: Every day | TRANSDERMAL | 4 refills | Status: DC
  Filled 2022-01-04: qty 28, 28d supply, fill #0

## 2022-01-04 NOTE — Progress Notes (Signed)
BH MD/PA/NP OP Progress Note Virtual Visit via Telephone Note  I connected with Mary [name] on 01/04/22 at  3:00 PM EDT by telephone and verified that I am speaking with the correct person using two identifiers.  Location: Patient: Work Provider: Clinic   I discussed the limitations, risks, security and privacy concerns of performing an evaluation and management service by telephone and the availability of in person appointments. I also discussed with the patient that there may be a patient responsible charge related to this service. The patient expressed understanding and agreed to proceed.   I provided 30 minutes of non-face-to-face time during this encounter.      01/04/2022 2:07 PM Mary [name]  MRN:  [id number]  Chief Complaint:   "I would like to quit smoking again"  HPI: 56 year old female seen today for follow up psychiatric evaluation. She has a psychiatric history of cocaine dependence (in remission), marijuana dependence (in remission), borderline personality disorder, schizophrenia, substance induced mood disorder, PTSD, and poly substance use. She is currently being managed on Wellbutrin XL 150 mg daily, Seroquel 50 mg daily, and Depakote 500 mg twice daily.  She informed Clinical research associate that her medications are somewhat effective in managing her psychiatric conditions.    Today she was unable to logon virtually so assessment done over the phone.  During exam she was pleasant, cooperative, and engaged in conversation.  She informed Clinical research associate that she currently smokes a pack of cigarettes a day and notes that she would like to discontinue this habit.  Since her last visit she notes that her anxiety, depression, and mood has improved.  Provider conducted a GAD-7 and patient scored a 13.  Provider also conducted PHQ-9 and patient scored a 10.  She endorses adequate sleep and increased appetite.  Patient notes that she has gained 1 or 2 pounds since her last visit.  Today she denies  SI/HI/VAH, mania, paranoia.    Today she is agreeable to restarting NicoDerm CQ 21 mg patches to help manage tobacco dependence.  She will continue all other medications as prescribed.  No other concerns noted at this time.     Visit Diagnosis:    ICD-10-CM   1. Bipolar I disorder, most recent episode depressed (HCC)  F31.30 buPROPion (WELLBUTRIN XL) 150 MG 24 hr tablet    divalproex (DEPAKOTE) 500 MG DR tablet    QUEtiapine (SEROQUEL) 50 MG tablet    2. Tobacco dependence  F17.200 buPROPion (WELLBUTRIN XL) 150 MG 24 hr tablet    nicotine (NICODERM CQ) 21 mg/24hr patch      Past Psychiatric History: cocaine dependence (in remission), marijuana dependence (in remission), borderline, schizophrenia substance induced mood disorder, PTSD, and poly substance use  Past Medical History:  Past Medical History:  Diagnosis Date   Bipolar depression (HCC)    Borderline personality disorder (HCC)    COPD (chronic obstructive pulmonary disease) (HCC)    GERD (gastroesophageal reflux disease)    Hiatal hernia    History of alcohol abuse    per pt none since 10/ 2014   History of attempted suicide    History of drug abuse in remission (HCC)    per pt in remission since 12-26-2012  polysubstance dependence (alcohol, crack, cocaine, 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   History of hepatitis C per pt first dx 2009 (approx)   consulted w/ dr comer (infectious disease) note 04-26-2016 , pt states was called and told per lab work no longer has hepatitis c  History of kidney stones    Hypercalcemia    mild and long-standing--- pt asymptomatic   Primary hyperparathyroidism [facility])    endocrinology --  dr Lucianne Muss   Pruritic erythematous rash    lower legs   PTSD (post-traumatic stress disorder)    Right ureteral stone    Schizophrenia (HCC)    Vitamin D deficiency     Past Surgical History:  Procedure Laterality Date   CYSTOSCOPY N/A 01/10/2017   Procedure: CYSTOSCOPY;  Surgeon: Hermina Staggers, MD;  [facility];  Service: Gynecology;  Laterality: N/A;   CYSTOSCOPY W/ URETERAL STENT PLACEMENT Right 07/14/2016   Procedure: CYSTOSCOPY WITH STENT REPLACEMENT;  Surgeon: Malen Gauze, MD;  Location: [facility];  Service: Urology;  Laterality: Right;   CYSTOSCOPY WITH RETROGRADE PYELOGRAM, URETEROSCOPY AND STENT PLACEMENT Right 06/28/2016   Procedure: CYSTOSCOPY WITH RETROGRADE PYELOGRAM, URETEROSCOPY AND STENT PLACEMENT;  Surgeon: Malen Gauze, MD;  [facility];  Service: Urology;  Laterality: Right;   CYSTOSCOPY/RETROGRADE/URETEROSCOPY/STONE EXTRACTION WITH BASKET Right 07/14/2016   Procedure: CYSTOSCOPY/RETROGRADE/URETEROSCOPY/STONE EXTRACTION WITH BASKET;  Surgeon: Malen Gauze, MD;  Location: [facility];  Service: Urology;  Laterality: Right;   DIAGNOSTIC LAPAROSCOPIC LIVER BIOPSY N/A 02/10/2013   Procedure: DIAGNOSTIC LAPAROSCOPIC ;  Surgeon: Ardeth Sportsman, MD;  Location[facility];  Service: General;  Laterality: N/A;  DIAGNOSTIC LAPAROSCOPY,laparoscopic ventral hernia repair with mesh lysis of adhesions   HERNIA REPAIR     HOLMIUM LASER APPLICATION Right 07/14/2016   Procedure: HOLMIUM LASER APPLICATION;  Surgeon: Malen Gauze, MD;  Location: [facility];  Service: Urology;  Laterality: Right;   SPLENECTOMY  2004   TUBAL LIGATION     VAGINAL HYSTERECTOMY N/A 01/10/2017   Procedure: HYSTERECTOMY VAGINAL W/ BILATERAL SALPINGECTOMY WITH REPAIR OF INCIDENTAL CYSTOTOMY;  Surgeon: Hermina Staggers, MD;  Location: [facility];  Service: Gynecology;  Laterality: N/A;    Family Psychiatric History: Mother heroin use (over dosed and died), sister alcoholic, sister cocaine abuse, and maternal grandmother alcohol use. Five children were born addicted to cocaine.  Family History:  Family History  Problem Relation Age of Onset   Alcohol abuse Mother    Alcohol abuse Father    Cancer Maternal Grandmother         colon   Diabetes Maternal Grandmother    Hypertension Maternal Grandmother    Alcohol abuse Sister    Thyroid disease Neg Hx     Social History:  Social History   Socioeconomic History   Marital status: Single    Spouse name: Not on file   Number of children: Not on file   Years of education: Not on file   Highest education level: Not on file  Occupational History   Not on file  Tobacco Use   Smoking status: Every Day    Packs/day: 1.00    Years: 32.00    Total pack years: 32.00    Types: Cigarettes   Smokeless tobacco: Never  Vaping Use   Vaping Use: Never used  Substance and Sexual Activity   Alcohol use: No    Comment: quit 12/2012   Drug use: No    Types: "Crack" cocaine, Marijuana, Cocaine    Comment: per pt quit 12-26-2012 marijuana 2 times a week   Sexual activity: Yes    Birth control/protection: None  Other Topics Concern   Not on file  Social History Narrative   Not on file   Social Determinants of  Health   Financial Resource Strain: Not on file  Food Insecurity: Not on file  Transportation Needs: Not on file  Physical Activity: Not on file  Stress: Not on file  Social Connections: Not on file    Allergies: No Known Allergies  Metabolic Disorder Labs: No results found for: "HGBA1C", "MPG" No results found for: "PROLACTIN" No results found for: "CHOL", "TRIG", "HDL", "CHOLHDL", "VLDL", "LDLCALC" No results found for: "TSH"  Therapeutic Level Labs: No results found for: "LITHIUM" No results found for: "VALPROATE" No results found for: "CBMZ"  Current Medications: Current Outpatient Medications  Medication Sig Dispense Refill   buPROPion (WELLBUTRIN XL) 150 MG 24 hr tablet TAKE 1 TABLET (150 MG TOTAL) BY MOUTH EVERY MORNING. 30 tablet 3   divalproex (DEPAKOTE) 500 MG DR tablet Take 1 tablet (500 mg total) by mouth 2 (two) times daily. 60 tablet 3   fluconazole (DIFLUCAN) 150 MG tablet Take 1 tablet (150 mg total) by mouth once a week. 2  tablet 0   metroNIDAZOLE (FLAGYL) 500 MG tablet Take 1 tablet (500 mg total) by mouth 2 (two) times daily. 14 tablet 0   nicotine (NICODERM CQ) 21 mg/24hr patch Place 1 patch (21 mg total) onto the skin daily. 28 patch 4   ondansetron (ZOFRAN-ODT) 8 MG disintegrating tablet Take 1 tablet (8 mg total) by mouth every 8 (eight) hours as needed. 10 tablet 0   QUEtiapine (SEROQUEL) 50 MG tablet Take 1 tablet (50 mg total) by mouth at bedtime. 30 tablet 3   No current facility-administered medications for this visit.     Musculoskeletal: Strength & Muscle Tone:  Unable to assess due to telephone visit Gait & Station:  Unable to assess due to telephone visit Patient leans: N/A  Psychiatric Specialty Exam: Review of Systems  There were no vitals taken for this visit.There is no height or weight on file to calculate BMI.  General Appearance:  Unable to assess due to telephone visit  Eye Contact:   Unable to assess due to telephone visit  Speech:  Clear and Coherent and Normal Rate  Volume:  Normal  Mood:  Euthymic  Affect:  Appropriate and Congruent  Thought Process:  Coherent, Goal Directed and Linear  Orientation:  Full (Time, Place, and Person)  Thought Content: WDL and Logical   Suicidal Thoughts:  No  Homicidal Thoughts:  No  Memory:  Immediate;   Good Recent;   Good Remote;   Good  Judgement:  Good  Insight:  Good  Psychomotor Activity:   Unable to assess due to telephone visit  Concentration:  Concentration: Good and Attention Span: Good  Recall:  Good  Fund of Knowledge: Good  Language: Good  Akathisia:   Unable to assess due to telephone visit  Handed:  Right  AIMS (if indicated): Not done  Assets:  Communication Skills Desire for Improvement Financial Resources/Insurance Housing Social Support  ADL's:  Intact  Cognition: WNL  Sleep:  Good   Screenings: GAD-7    Flowsheet Row Video Visit from 01/04/2022 in [facility] Video Visit from  03/21/2021 in [facility] Video Visit from 12/06/2020 in [facility] Video Visit from 09/06/2020 in [facility] Video Visit from 06/03/2020 in [facility]  Total GAD-7 Score 13 21 18 6 17       PHQ2-9    Flowsheet Row Video Visit from 01/04/2022 in [facility] Video Visit  from 03/21/2021 in [facility] Video Visit from 12/06/2020 in [facility] Video Visit from 09/06/2020 in [facility] Video Visit from 06/03/2020 in [facility]  PHQ-2 Total Score 2 6 4  0 6  PHQ-9 Total Score 10 21 16 3 15       Flowsheet Row ED from 08/01/2021 in [facility]  ED from 07/09/2021 in [facility] Video Visit from 03/21/2021 in [facility] No Risk No Risk Error: Q7 should not be populated when Q6 is No        Assessment and Plan: Patient reports that she feels stable on her current medication regimen.  She however reports that she continues to suffer from tobacco dependence and would like to overcome this habit.Today she is agreeable to restarting NicoDerm CQ 21 mg patches to help manage tobacco dependence.  She will continue all other medications as prescribed.   1. Bipolar I disorder, most recent episode depressed (HCC)  Continue- buPROPion (WELLBUTRIN XL) 150 MG 24 hr tablet; TAKE 1 TABLET (150 MG TOTAL) BY MOUTH EVERY MORNING.  Dispense: 30 tablet; Refill: 3 Continue- divalproex (DEPAKOTE) 500 MG DR tablet; Take 1 tablet (500 mg total) by mouth 2 (two) times daily.  Dispense: 60 tablet; Refill: 3 Continue- QUEtiapine (SEROQUEL) 50 MG tablet; Take 1 tablet (50 mg total) by mouth at bedtime.  Dispense: 30 tablet; Refill: 3  2. Tobacco  dependence  Continue- buPROPion (WELLBUTRIN XL) 150 MG 24 hr tablet; TAKE 1 TABLET (150 MG TOTAL) BY MOUTH EVERY MORNING.  Dispense: 30 tablet; Refill: 3 Restart- nicotine (NICODERM CQ) 21 mg/24hr patch; Place 1 patch (21 mg total) onto the skin daily.  Dispense: 28 patch; Refill: 4     Follow up in 3 months   Follow up with therapy   Salley Slaughter, NP 01/04/2022, 2:07 PM

## 2022-01-09 ENCOUNTER — Other Ambulatory Visit: Payer: Self-pay

## 2022-01-20 ENCOUNTER — Other Ambulatory Visit: Payer: Self-pay

## 2022-01-21 ENCOUNTER — Encounter (HOSPITAL_COMMUNITY): Payer: Self-pay

## 2022-01-21 ENCOUNTER — Ambulatory Visit (HOSPITAL_COMMUNITY)
Admission: EM | Admit: 2022-01-21 | Discharge: 2022-01-21 | Disposition: A | Discharge status: Home / Self Care | Payer: Self-pay | Attending: Physician Assistant | Admitting: Physician Assistant

## 2022-01-21 DIAGNOSIS — R21 Rash and other nonspecific skin eruption: Secondary | ICD-10-CM | POA: Insufficient documentation

## 2022-01-21 DIAGNOSIS — N760 Acute vaginitis: Secondary | ICD-10-CM | POA: Insufficient documentation

## 2022-01-21 MED ORDER — ONDANSETRON HCL 4 MG PO TABS
4.0000 mg | ORAL_TABLET | Freq: Three times a day (TID) | ORAL | 0 refills | Status: DC | PRN
  Filled 2022-01-21: qty 10, 4d supply, fill #0

## 2022-01-21 MED ORDER — CLOBETASOL PROPIONATE 0.05 % EX CREA
1.0000 | TOPICAL_CREAM | Freq: Two times a day (BID) | CUTANEOUS | 0 refills | Status: DC
  Filled 2022-01-21: qty 15, 30d supply, fill #0

## 2022-01-21 NOTE — ED Triage Notes (Signed)
Here for rash on ankle x 3-4 days. Pt would like STD testing.

## 2022-01-21 NOTE — ED Provider Notes (Signed)
[facility]   MRN: [id number] DOB: 27-Apr-1965  Subjective:   Mary [name] is a 56 y.o. female presenting for rash on her right lower ankle that she says has been going on for several months to maybe a year.  It stays very itchy and dry.  She has tried some over-the-counter lotion without relief.  She has never tried any prescription creams or ointment.  Patient also requesting STD and vaginal testing.  She has been sexually active and not using condoms.  She complains of some yellow vaginal discharge for the last few days.  No itching.  She is post menopause status. Some intermittent nausea. No pelvic or abdominal pain.   No current facility-administered medications for this encounter.  Current Outpatient Medications:    clobetasol cream (TEMOVATE) 0.05 %, Apply 1 Application topically 2 (two) times daily. Apply to affected area twice daily for no longer than 2 weeks consistently, then as needed after then. Wash hands well after use., Disp: 45 g, Rfl: 0   ondansetron (ZOFRAN) 4 MG tablet, Take 1 tablet (4 mg total) by mouth every 8 (eight) hours as needed for nausea or vomiting., Disp: 10 tablet, Rfl: 0   buPROPion (WELLBUTRIN XL) 150 MG 24 hr tablet, TAKE 1 TABLET (150 MG TOTAL) BY MOUTH EVERY MORNING., Disp: 30 tablet, Rfl: 3   divalproex (DEPAKOTE) 500 MG DR tablet, Take 1 tablet (500 mg total) by mouth 2 (two) times daily., Disp: 60 tablet, Rfl: 3   fluconazole (DIFLUCAN) 150 MG tablet, Take 1 tablet (150 mg total) by mouth once a week., Disp: 2 tablet, Rfl: 0   metroNIDAZOLE (FLAGYL) 500 MG tablet, Take 1 tablet (500 mg total) by mouth 2 (two) times daily., Disp: 14 tablet, Rfl: 0   nicotine (NICODERM CQ) 21 mg/24hr patch, Place 1 patch (21 mg total) onto the skin daily., Disp: 28 patch, Rfl: 4   ondansetron (ZOFRAN-ODT) 8 MG disintegrating tablet, Take 1 tablet (8 mg total) by mouth every 8 (eight) hours as needed., Disp: 10 tablet, Rfl: 0   QUEtiapine (SEROQUEL) 50  MG tablet, Take 1 tablet (50 mg total) by mouth at bedtime., Disp: 30 tablet, Rfl: 3   No Known Allergies  Past Medical History:  Diagnosis Date   Bipolar depression (HCC)    Borderline personality disorder (HCC)    COPD (chronic obstructive pulmonary disease) (HCC)    GERD (gastroesophageal reflux disease)    Hiatal hernia    History of alcohol abuse    per pt none since 10/ 2014   History of attempted suicide    History of drug abuse in remission (HCC)    per pt in remission since 12-26-2012  polysubstance dependence (alcohol, crack, cocaine, 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   History of hepatitis C per pt first dx 2009 (approx)   consulted w/ dr comer (infectious disease) note 04-26-2016 , pt states was called and told per lab work no longer has hepatitis c    History of kidney stones    Hypercalcemia    mild and long-standing--- pt asymptomatic   Primary hyperparathyroidism [facility])    endocrinology --  dr Lucianne Muss   Pruritic erythematous rash    lower legs   PTSD (post-traumatic stress disorder)    Right ureteral stone    Schizophrenia (HCC)    Vitamin D deficiency      Past Surgical History:  Procedure Laterality Date   CYSTOSCOPY N/A 01/10/2017   Procedure: CYSTOSCOPY;  Surgeon: Hermina Staggers, MD;  Location: [facility];  Service: Gynecology;  Laterality: N/A;   CYSTOSCOPY W/ URETERAL STENT PLACEMENT Right 07/14/2016   Procedure: CYSTOSCOPY WITH STENT REPLACEMENT;  Surgeon: Malen Gauze, MD;  Location: [facility];  Service: Urology;  Laterality: Right;   CYSTOSCOPY WITH RETROGRADE PYELOGRAM, URETEROSCOPY AND STENT PLACEMENT Right 06/28/2016   Procedure: CYSTOSCOPY WITH RETROGRADE PYELOGRAM, URETEROSCOPY AND STENT PLACEMENT;  Surgeon: Malen Gauze, MD;  Location: [facility];  Service: Urology;  Laterality: Right;   CYSTOSCOPY/RETROGRADE/URETEROSCOPY/STONE EXTRACTION WITH BASKET Right 07/14/2016   Procedure: CYSTOSCOPY/RETROGRADE/URETEROSCOPY/STONE  EXTRACTION WITH BASKET;  Surgeon: Malen Gauze, MD;  Location: [facility];  Service: Urology;  Laterality: Right;   DIAGNOSTIC LAPAROSCOPIC LIVER BIOPSY N/A 02/10/2013   Procedure: DIAGNOSTIC LAPAROSCOPIC ;  Surgeon: Ardeth Sportsman, MD;  Location: [facility];  Service: General;  Laterality: N/A;  DIAGNOSTIC LAPAROSCOPY,laparoscopic ventral hernia repair with mesh lysis of adhesions   HERNIA REPAIR     HOLMIUM LASER APPLICATION Right 07/14/2016   Procedure: HOLMIUM LASER APPLICATION;  Surgeon: Malen Gauze, MD;  Location: [facility];  Service: Urology;  Laterality: Right;   SPLENECTOMY  2004   TUBAL LIGATION     VAGINAL HYSTERECTOMY N/A 01/10/2017   Procedure: HYSTERECTOMY VAGINAL W/ BILATERAL SALPINGECTOMY WITH REPAIR OF INCIDENTAL CYSTOTOMY;  Surgeon: Hermina Staggers, MD;  Location: [facility];  Service: Gynecology;  Laterality: N/A;    Family History  Problem Relation Age of Onset   Alcohol abuse Mother    Alcohol abuse Father    Cancer Maternal Grandmother        colon   Diabetes Maternal Grandmother    Hypertension Maternal Grandmother    Alcohol abuse Sister    Thyroid disease Neg Hx     Social History   Tobacco Use   Smoking status: Every Day    Packs/day: 1.00    Years: 32.00    Total pack years: 32.00    Types: Cigarettes   Smokeless tobacco: Never  Vaping Use   Vaping Use: Never used  Substance Use Topics   Alcohol use: No    Comment: quit 12/2012   Drug use: No    Types: "Crack" cocaine, Marijuana, Cocaine    Comment: per pt quit 12-26-2012 marijuana 2 times a week    ROS REFER TO HPI FOR PERTINENT POSITIVES AND NEGATIVES   Objective:   Vitals: BP 120/79 (BP Location: Left Arm)   Pulse 78   Temp 98.1 F (36.7 C) (Oral)   Resp 16   LMP  (LMP Unknown)   SpO2 99%   Physical Exam Constitutional:      Appearance: Normal appearance.  Cardiovascular:     Rate and Rhythm: Normal rate and regular rhythm.  Pulmonary:      Effort: Pulmonary effort is normal.     Breath sounds: Normal breath sounds.  Genitourinary:    Comments: Declines GU exam Skin:    Findings: Rash (approx 3 x 4 cm patch right lower ankle dry, flaking, excoriation present, lichenified) present.  Neurological:     Mental Status: She is alert.  Psychiatric:        Mood and Affect: Mood normal.     No results found for this or any previous visit (from the past 24 hour(s)).  Assessment and Plan :   PDMP not reviewed this encounter.  1. Rash and nonspecific skin eruption   2. Acute vaginitis     Rash looks  like atopic dermatitis that is now in an itch-rash cycle.  Gave prescription for clobetasol cream to use as directed.  She will wash hands well after use.  Advised to not use more than 2 weeks in duration and then sparingly after that.  She may need to follow-up with dermatology or PCP if not improving.  Patient declined GU exam as she was in a hurry today.  We will send off testing and treat pending results.  Advised safe sex practices always.   AllwardtCrist Infante, PA-C 01/21/22 1824

## 2022-01-23 ENCOUNTER — Other Ambulatory Visit: Payer: Self-pay

## 2022-01-23 LAB — CERVICOVAGINAL ANCILLARY ONLY
Bacterial Vaginitis (gardnerella): POSITIVE — AB
Candida Glabrata: NEGATIVE
Candida Vaginitis: NEGATIVE
Chlamydia: NEGATIVE
Comment: NEGATIVE
Comment: NEGATIVE
Comment: NEGATIVE
Comment: NEGATIVE
Comment: NEGATIVE
Comment: NORMAL
Neisseria Gonorrhea: NEGATIVE
Trichomonas: NEGATIVE

## 2022-01-24 ENCOUNTER — Other Ambulatory Visit: Payer: Self-pay

## 2022-01-24 ENCOUNTER — Telehealth (HOSPITAL_COMMUNITY): Payer: Self-pay | Admitting: Emergency Medicine

## 2022-01-24 MED ORDER — METRONIDAZOLE 500 MG PO TABS
500.0000 mg | ORAL_TABLET | Freq: Two times a day (BID) | ORAL | 0 refills | Status: DC
  Filled 2022-01-24: qty 14, 7d supply, fill #0

## 2022-02-21 ENCOUNTER — Other Ambulatory Visit: Payer: Self-pay

## 2022-02-28 ENCOUNTER — Other Ambulatory Visit: Payer: Self-pay

## 2022-03-21 ENCOUNTER — Other Ambulatory Visit: Payer: Self-pay

## 2022-04-06 ENCOUNTER — Other Ambulatory Visit: Payer: Self-pay

## 2022-04-06 ENCOUNTER — Encounter (HOSPITAL_COMMUNITY): Payer: Self-pay | Admitting: Emergency Medicine

## 2022-04-06 ENCOUNTER — Emergency Department (HOSPITAL_COMMUNITY)
Admission: EM | Admit: 2022-04-06 | Discharge: 2022-04-06 | Discharge status: Against Medical Advice | Payer: Self-pay | Attending: Emergency Medicine | Admitting: Emergency Medicine

## 2022-04-06 ENCOUNTER — Encounter (HOSPITAL_COMMUNITY): Payer: Self-pay | Admitting: Psychiatry

## 2022-04-06 ENCOUNTER — Emergency Department (HOSPITAL_COMMUNITY): Payer: Self-pay

## 2022-04-06 ENCOUNTER — Telehealth (INDEPENDENT_AMBULATORY_CARE_PROVIDER_SITE_OTHER): Payer: No Payment, Other | Admitting: Psychiatry

## 2022-04-06 DIAGNOSIS — R110 Nausea: Secondary | ICD-10-CM | POA: Insufficient documentation

## 2022-04-06 DIAGNOSIS — F3130 Bipolar disorder, current episode depressed, mild or moderate severity, unspecified: Secondary | ICD-10-CM | POA: Diagnosis not present

## 2022-04-06 DIAGNOSIS — Z5321 Procedure and treatment not carried out due to patient leaving prior to being seen by health care provider: Secondary | ICD-10-CM | POA: Insufficient documentation

## 2022-04-06 DIAGNOSIS — F17210 Nicotine dependence, cigarettes, uncomplicated: Secondary | ICD-10-CM | POA: Diagnosis not present

## 2022-04-06 DIAGNOSIS — R350 Frequency of micturition: Secondary | ICD-10-CM | POA: Insufficient documentation

## 2022-04-06 DIAGNOSIS — R002 Palpitations: Secondary | ICD-10-CM | POA: Insufficient documentation

## 2022-04-06 DIAGNOSIS — F17200 Nicotine dependence, unspecified, uncomplicated: Secondary | ICD-10-CM

## 2022-04-06 LAB — BASIC METABOLIC PANEL
Anion gap: 6 (ref 5–15)
BUN: 9 mg/dL (ref 6–20)
CO2: 25 mmol/L (ref 22–32)
Calcium: 10.4 mg/dL — ABNORMAL HIGH (ref 8.9–10.3)
Chloride: 105 mmol/L (ref 98–111)
Creatinine, Ser: 0.66 mg/dL (ref 0.44–1.00)
GFR, Estimated: >60 mL/min (ref >60)
Glucose, Bld: 93 mg/dL (ref 70–99)
Potassium: 4.3 mmol/L (ref 3.5–5.1)
Sodium: 136 mmol/L (ref 135–145)

## 2022-04-06 LAB — CBC
HCT: 38.2 % (ref 36.0–46.0)
Hemoglobin: 12.8 g/dL (ref 12.0–15.0)
MCH: 31.6 pg (ref 26.0–34.0)
MCHC: 33.5 g/dL (ref 30.0–36.0)
MCV: 94.3 fL (ref 80.0–100.0)
Platelets: 284 10*3/uL (ref 150–400)
RBC: 4.05 MIL/uL (ref 3.87–5.11)
RDW: 12.9 % (ref 11.5–15.5)
WBC: 6.4 10*3/uL (ref 4.0–10.5)
nRBC: 0 % (ref 0.0–0.2)

## 2022-04-06 LAB — URINALYSIS, ROUTINE W REFLEX MICROSCOPIC
Bilirubin Urine: NEGATIVE
Glucose, UA: NEGATIVE mg/dL
Hgb urine dipstick: NEGATIVE
Ketones, ur: NEGATIVE mg/dL
Leukocytes,Ua: NEGATIVE
Nitrite: NEGATIVE
Protein, ur: NEGATIVE mg/dL
Specific Gravity, Urine: 1.025 (ref 1.005–1.030)
pH: 6 (ref 5.0–8.0)

## 2022-04-06 LAB — TROPONIN I (HIGH SENSITIVITY)
Troponin I (High Sensitivity): 3 ng/L (ref <18)
Troponin I (High Sensitivity): <2 ng/L (ref <18)

## 2022-04-06 MED ORDER — QUETIAPINE FUMARATE 50 MG PO TABS
50.0000 mg | ORAL_TABLET | Freq: Every day | ORAL | 3 refills | Status: DC
  Filled 2022-04-06 – 2022-04-24 (×2): qty 30, 30d supply, fill #0
  Filled 2022-05-26: qty 30, 30d supply, fill #1

## 2022-04-06 MED ORDER — DIVALPROEX SODIUM 500 MG PO DR TAB
500.0000 mg | DELAYED_RELEASE_TABLET | Freq: Two times a day (BID) | ORAL | 3 refills | Status: DC
  Filled 2022-04-06 – 2022-05-26 (×2): qty 60, 30d supply, fill #0

## 2022-04-06 MED ORDER — NICOTINE 21 MG/24HR TD PT24
21.0000 mg | MEDICATED_PATCH | Freq: Every day | TRANSDERMAL | 4 refills | Status: DC
  Filled 2022-04-06: qty 28, 28d supply, fill #0

## 2022-04-06 MED ORDER — BUPROPION HCL ER (XL) 150 MG PO TB24
150.0000 mg | ORAL_TABLET | Freq: Every morning | ORAL | 3 refills | Status: DC
  Filled 2022-04-06 – 2022-04-24 (×2): qty 30, 30d supply, fill #0
  Filled 2022-05-26: qty 30, 30d supply, fill #1

## 2022-04-06 NOTE — ED Notes (Signed)
Pt stated that she wanted to leave to get some sleep. Pt was informed to return the the ED if conditions worsen. Pt stated that she will come back to the ED tomorrow to get her results. Pt seen leaving the ED.

## 2022-04-06 NOTE — Progress Notes (Signed)
BH MD/PA/NP OP Progress Note Virtual Visit via Video Note  I connected with Mary [name] on 04/06/22 at  1:30 PM EST by a video enabled telemedicine application and verified that I am speaking with the correct person using two identifiers.  Location: Patient: Home Provider: Clinic   I discussed the limitations of evaluation and management by telemedicine and the availability of in person appointments. The patient expressed understanding and agreed to proceed.  I provided 30 minutes of non-face-to-face time during this encounter.       04/06/2022 1:20 PM Mary [name]  MRN:  [id number]  Chief Complaint:   "Everything is a task"  HPI: 57 year old female seen today for follow up psychiatric evaluation. She has a psychiatric history of cocaine dependence (in remission), marijuana dependence (in remission), borderline personality disorder, schizophrenia, substance induced mood disorder, PTSD, and poly substance use. She is currently being managed on Wellbutrin XL 150 mg daily, Seroquel 50 mg daily, and Depakote 500 mg twice daily.  She informed Clinical research associate that her medications are effective in managing her psychiatric conditions.    Today she was well groomed, pleasant, cooperative, and engaged in conversation. She informed Clinical research associate that everything is a task. She notes that recently she got a part time job at [employer] as she needed extra money for her car. Patient notes that the engine in her car went out and she had to purchased a new one. Patient also reports that she has been estranged from her daughter which she reports that this too is stressful. Physically patient notes that she feels overwhelmed. She informed Clinical research associate that at times her heart rate increases and she feels pressure on her chest. She notes that she was considering going to the ED to have this assessed. Provider encouraged patient to go to the ED today or follow up with her PCP. She notes that she will go to the ED today.  Dispute  the above stressors patient reports that her anxiety and depression are well managed. Today provider conducted a GAD 7 and patient scored a 9, at her last visit she scored a 13.   Provider also conducted PHQ-9 and patient scored a 11, at her last visit she scored a 10.  She endorses adequate sleep and poor appetite (reports 3-5 pounds weight loss since last visit).  Today she denies SI/HI/VAH, mania, paranoia.    To cope patient notes that she smokes but wants to stop. She informed Clinical research associate that she has not started Nicoderm patches but would like to. Today she is agreeable to restarting NicoDerm CQ 21 mg patches to help manage tobacco dependence.  She will continue all other medications as prescribed.  Patient follow-up with ED today to address cardiac concerns.  No other concerns noted at this time.     Visit Diagnosis:    ICD-10-CM   1. Bipolar I disorder, most recent episode depressed (HCC)  F31.30 buPROPion (WELLBUTRIN XL) 150 MG 24 hr tablet    divalproex (DEPAKOTE) 500 MG DR tablet    QUEtiapine (SEROQUEL) 50 MG tablet    2. Tobacco dependence  F17.200 buPROPion (WELLBUTRIN XL) 150 MG 24 hr tablet    nicotine (NICODERM CQ) 21 mg/24hr patch      Past Psychiatric History: cocaine dependence (in remission), marijuana dependence (in remission), borderline, schizophrenia substance induced mood disorder, PTSD, and poly substance use  Past Medical History:  Past Medical History:  Diagnosis Date   Bipolar depression (HCC)    Borderline personality disorder (HCC)  COPD (chronic obstructive pulmonary disease) (HCC)    GERD (gastroesophageal reflux disease)    Hiatal hernia    History of alcohol abuse    per pt none since 10/ 2014   History of attempted suicide    History of drug abuse in remission ([place])    per pt in remission since 12-26-2012  polysubstance dependence (alcohol, crack, cocaine, 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   History of hepatitis C per pt first dx 2009 (approx)    consulted w/ dr comer (infectious disease) note 04-26-2016 , pt states was called and told per lab work no longer has hepatitis c    History of kidney stones    Hypercalcemia    mild and long-standing--- pt asymptomatic   Primary hyperparathyroidism [facility])    endocrinology --  dr Dwyane Dee   Pruritic erythematous rash    lower legs   PTSD (post-traumatic stress disorder)    Right ureteral stone    Schizophrenia ([place])    Vitamin D deficiency     Past Surgical History:  Procedure Laterality Date   CYSTOSCOPY N/A 01/10/2017   Procedure: CYSTOSCOPY;  Surgeon: Chancy Milroy, MD;  Location: [facility];  Service: Gynecology;  Laterality: N/A;   CYSTOSCOPY W/ URETERAL STENT PLACEMENT Right 07/14/2016   Procedure: CYSTOSCOPY WITH STENT REPLACEMENT;  Surgeon: Cleon Gustin, MD;  Location[facility];  Service: Urology;  Laterality: Right;   CYSTOSCOPY WITH RETROGRADE PYELOGRAM, URETEROSCOPY AND STENT PLACEMENT Right 06/28/2016   Procedure: CYSTOSCOPY WITH RETROGRADE PYELOGRAM, URETEROSCOPY AND STENT PLACEMENT;  Surgeon: Cleon Gustin, MD;  Location: [facility];  Service: Urology;  Laterality: Right;   CYSTOSCOPY/RETROGRADE/URETEROSCOPY/STONE EXTRACTION WITH BASKET Right 07/14/2016   Procedure: CYSTOSCOPY/RETROGRADE/URETEROSCOPY/STONE EXTRACTION WITH BASKET;  Surgeon: Cleon Gustin, MD;  Location: [facility];  Service: Urology;  Laterality: Right;   DIAGNOSTIC LAPAROSCOPIC LIVER BIOPSY N/A 02/10/2013   Procedure: DIAGNOSTIC LAPAROSCOPIC ;  Surgeon: Adin Hector, MD;  Location: [facility];  Service: General;  Laterality: N/A;  DIAGNOSTIC LAPAROSCOPY,laparoscopic ventral hernia repair with mesh lysis of adhesions   HERNIA REPAIR     HOLMIUM LASER APPLICATION Right 0/03/7508   Procedure: HOLMIUM LASER APPLICATION;  Surgeon: Cleon Gustin, MD;  Location: [facility];  Service: Urology;  Laterality: Right;   SPLENECTOMY  2004   TUBAL LIGATION      VAGINAL HYSTERECTOMY N/A 01/10/2017   Procedure: HYSTERECTOMY VAGINAL W/ BILATERAL SALPINGECTOMY WITH REPAIR OF INCIDENTAL CYSTOTOMY;  Surgeon: Chancy Milroy, MD;  Location: [facility];  Service: Gynecology;  Laterality: N/A;    Family Psychiatric History: Mother heroin use (over dosed and died), sister alcoholic, sister cocaine abuse, and maternal grandmother alcohol use. Five children were born addicted to cocaine.  Family History:  Family History  Problem Relation Age of Onset   Alcohol abuse Mother    Alcohol abuse Father    Cancer Maternal Grandmother        colon   Diabetes Maternal Grandmother    Hypertension Maternal Grandmother    Alcohol abuse Sister    Thyroid disease Neg Hx     Social History:  Social History   Socioeconomic History   Marital status: Single    Spouse name: Not on file   Number of children: Not on file   Years of education: Not on file   Highest education level: Not on file  Occupational History   Not on file  Tobacco Use   Smoking status: Every  Day    Packs/day: 1.00    Years: 32.00    Total pack years: 32.00    Types: Cigarettes   Smokeless tobacco: Never  Vaping Use   Vaping Use: Never used  Substance and Sexual Activity   Alcohol use: No    Comment: quit 12/2012   Drug use: No    Types: "Crack" cocaine, Marijuana, Cocaine    Comment: per pt quit 12-26-2012 marijuana 2 times a week   Sexual activity: Yes    Birth control/protection: None  Other Topics Concern   Not on file  Social History Narrative   Not on file   Social Determinants of Health   Financial Resource Strain: Not on file  Food Insecurity: Not on file  Transportation Needs: Not on file  Physical Activity: Not on file  Stress: Not on file  Social Connections: Not on file    Allergies: No Known Allergies  Metabolic Disorder Labs: No results found for: "HGBA1C", "MPG" No results found for: "PROLACTIN" No results found for: "CHOL", "TRIG", "HDL", "CHOLHDL",  "VLDL", "LDLCALC" No results found for: "TSH"  Therapeutic Level Labs: No results found for: "LITHIUM" No results found for: "VALPROATE" No results found for: "CBMZ"  Current Medications: Current Outpatient Medications  Medication Sig Dispense Refill   buPROPion (WELLBUTRIN XL) 150 MG 24 hr tablet TAKE 1 TABLET (150 MG TOTAL) BY MOUTH EVERY MORNING. 30 tablet 3   clobetasol cream (TEMOVATE) 0.05 % Apply topically to affected area twice daily for no longer than 2 weeks consistently, then as needed after then. Wash hands well after use. 45 g 0   divalproex (DEPAKOTE) 500 MG DR tablet Take 1 tablet (500 mg total) by mouth 2 (two) times daily. 60 tablet 3   fluconazole (DIFLUCAN) 150 MG tablet Take 1 tablet (150 mg total) by mouth once a week. 2 tablet 0   metroNIDAZOLE (FLAGYL) 500 MG tablet Take 1 tablet (500 mg total) by mouth 2 (two) times daily. 14 tablet 0   nicotine (NICODERM CQ) 21 mg/24hr patch Place 1 patch (21 mg total) onto the skin daily. 28 patch 4   ondansetron (ZOFRAN) 4 MG tablet Take 1 tablet (4 mg total) by mouth every 8 (eight) hours as needed for nausea or vomiting. 10 tablet 0   ondansetron (ZOFRAN-ODT) 8 MG disintegrating tablet Take 1 tablet (8 mg total) by mouth every 8 (eight) hours as needed. 10 tablet 0   QUEtiapine (SEROQUEL) 50 MG tablet Take 1 tablet (50 mg total) by mouth at bedtime. 30 tablet 3   No current facility-administered medications for this visit.     Musculoskeletal: Strength & Muscle Tone: within normal limits and Telehealth visit Gait & Station:  Unable to assess due to telephone visit Patient leans: N/A  Psychiatric Specialty Exam: Review of Systems  There were no vitals taken for this visit.There is no height or weight on file to calculate BMI.  General Appearance: Well Groomed  Eye Contact:  Good  Speech:  Clear and Coherent and Normal Rate  Volume:  Normal  Mood:  Euthymic  Affect:  Appropriate and Congruent  Thought Process:   Coherent, Goal Directed and Linear  Orientation:  Full (Time, Place, and Person)  Thought Content: WDL and Logical   Suicidal Thoughts:  No  Homicidal Thoughts:  No  Memory:  Immediate;   Good Recent;   Good Remote;   Good  Judgement:  Good  Insight:  Good  Psychomotor Activity:  Normal  Concentration:  Concentration:  Good and Attention Span: Good  Recall:  Good  Fund of Knowledge: Good  Language: Good  Akathisia:  No  Handed:  Right  AIMS (if indicated): Not done  Assets:  Communication Skills Desire for Improvement Financial Resources/Insurance Housing Social Support  ADL's:  Intact  Cognition: WNL  Sleep:  Good   Screenings: GAD-7    Flowsheet Row Video Visit from 04/06/2022 in [facility] Video Visit from 01/04/2022 in [facility] Video Visit from 03/21/2021 in [facility] Video Visit from 12/06/2020 in [facility] Video Visit from 09/06/2020 in [facility]  Total GAD-7 Score 9 13 21 18 6       PHQ2-9    Flowsheet Row Video Visit from 04/06/2022 in [facility] Video Visit from 01/04/2022 in [facility] Video Visit from 03/21/2021 in [facility] Video Visit from 12/06/2020 in [facility] Video Visit from 09/06/2020 in [place]  PHQ-2 Total Score 4 2 6 4  0  PHQ-9 Total Score 11 10 21 16 3       Flowsheet Row ED from 01/21/2022 in [facility] ED from 08/01/2021 in [facility] at [facility]) ED from 07/09/2021 in [facility] Emergency Department at [place] No Risk No Risk No Risk        Assessment and Plan: Patient reports that life has been stressful but informed writer that her anxiety, mood, and  depression are well-managed.  Patient does note that she has been having cardiac issues.  She informed Probation officer that she has been smoking but would like to start.  She is agreeable to restarting NicoDerm CQ 21 mg patches.  No other medication changes made.  Patient agreeable to continue medication as prescribed.  She will follow-up with the ED to address acute cardiac concerns today. 1. Bipolar I disorder, most recent episode depressed (HCC)  Continue- buPROPion (WELLBUTRIN XL) 150 MG 24 hr tablet; TAKE 1 TABLET (150 MG TOTAL) BY MOUTH EVERY MORNING.  Dispense: 30 tablet; Refill: 3 Continue- divalproex (DEPAKOTE) 500 MG DR tablet; Take 1 tablet (500 mg total) by mouth 2 (two) times daily.  Dispense: 60 tablet; Refill: 3 Continue- QUEtiapine (SEROQUEL) 50 MG tablet; Take 1 tablet (50 mg total) by mouth at bedtime.  Dispense: 30 tablet; Refill: 3  2. Tobacco dependence  Continue- buPROPion (WELLBUTRIN XL) 150 MG 24 hr tablet; TAKE 1 TABLET (150 MG TOTAL) BY MOUTH EVERY MORNING.  Dispense: 30 tablet; Refill: 3 Restart- nicotine (NICODERM CQ) 21 mg/24hr patch; Place 1 patch (21 mg total) onto the skin daily.  Dispense: 28 patch; Refill: 4     Follow up in 3 months   Follow up with therapy   Salley Slaughter, NP 04/06/2022, 1:20 PM

## 2022-04-06 NOTE — ED Provider Triage Note (Signed)
Emergency Medicine Provider Triage Evaluation Note  Mary [name] , a 57 y.o. female  was evaluated in triage.  Pt complains of chest fluttering in the last 1 month. Intermittent.  Feels like food is stuck in her chest.  Tried ginger ale with minimal improvement.  Also reports increased urination in the last 6 months.  No itching or burning sensation with urination.  No abdominal pain.  Review of Systems  Positive: As above Negative: As above  Physical Exam  BP 110/80 (BP Location: Right Arm)   Pulse 72   Temp 99.4 F (37.4 C) (Oral)   Resp 20   Ht 5\' 4"  (1.626 m)   Wt 79 kg   LMP  (LMP Unknown)   SpO2 96%   BMI 29.90 kg/m  Gen:   Awake, no distress   Resp:  Normal effort  MSK:   Moves extremities without difficulty  Other:    Medical Decision Making  Medically screening exam initiated at 3:33 PM.  Appropriate orders placed.  Cordelia Bessinger was informed that the remainder of the evaluation will be completed by another provider, this initial triage assessment does not replace that evaluation, and the importance of remaining in the ED until their evaluation is complete.    Rex Kras, [place] 04/06/22 1534

## 2022-04-06 NOTE — ED Triage Notes (Signed)
Pt reports for the past month, she has had intermittent heart fluttering. Also states that she has nausea and will feel like her food is getting stuck. Denies CP.

## 2022-04-13 ENCOUNTER — Other Ambulatory Visit: Payer: Self-pay

## 2022-04-20 ENCOUNTER — Other Ambulatory Visit (HOSPITAL_COMMUNITY): Payer: Self-pay

## 2022-04-24 ENCOUNTER — Other Ambulatory Visit: Payer: Self-pay

## 2022-05-26 ENCOUNTER — Other Ambulatory Visit: Payer: Self-pay

## 2022-06-08 ENCOUNTER — Other Ambulatory Visit: Payer: Self-pay

## 2022-06-08 MED ORDER — PENICILLIN V POTASSIUM 500 MG PO TABS
500.0000 mg | ORAL_TABLET | Freq: Four times a day (QID) | ORAL | 0 refills | Status: DC
  Filled 2022-06-08: qty 28, 7d supply, fill #0

## 2022-06-28 ENCOUNTER — Encounter (HOSPITAL_COMMUNITY): Payer: Self-pay

## 2022-06-28 ENCOUNTER — Telehealth (HOSPITAL_COMMUNITY): Payer: BC Managed Care – PPO | Admitting: Psychiatry

## 2022-06-29 ENCOUNTER — Telehealth (INDEPENDENT_AMBULATORY_CARE_PROVIDER_SITE_OTHER): Payer: No Payment, Other | Admitting: Psychiatry

## 2022-06-29 ENCOUNTER — Other Ambulatory Visit: Payer: Self-pay

## 2022-06-29 ENCOUNTER — Encounter (HOSPITAL_COMMUNITY): Payer: Self-pay | Admitting: Psychiatry

## 2022-06-29 DIAGNOSIS — F3130 Bipolar disorder, current episode depressed, mild or moderate severity, unspecified: Secondary | ICD-10-CM | POA: Diagnosis not present

## 2022-06-29 DIAGNOSIS — F411 Generalized anxiety disorder: Secondary | ICD-10-CM

## 2022-06-29 DIAGNOSIS — F17200 Nicotine dependence, unspecified, uncomplicated: Secondary | ICD-10-CM

## 2022-06-29 MED ORDER — NICOTINE 21 MG/24HR TD PT24
21.0000 mg | MEDICATED_PATCH | Freq: Every day | TRANSDERMAL | 4 refills | Status: DC
  Filled 2022-06-29: qty 28, 28d supply, fill #0

## 2022-06-29 MED ORDER — QUETIAPINE FUMARATE 50 MG PO TABS
50.0000 mg | ORAL_TABLET | Freq: Every day | ORAL | 3 refills | Status: DC
  Filled 2022-06-29: qty 30, 30d supply, fill #0
  Filled 2022-08-09: qty 30, 30d supply, fill #1

## 2022-06-29 MED ORDER — BUPROPION HCL ER (XL) 300 MG PO TB24
300.0000 mg | ORAL_TABLET | Freq: Every morning | ORAL | 3 refills | Status: DC
  Filled 2022-06-29: qty 30, 30d supply, fill #0

## 2022-06-29 MED ORDER — DIVALPROEX SODIUM 500 MG PO DR TAB
500.0000 mg | DELAYED_RELEASE_TABLET | Freq: Three times a day (TID) | ORAL | 3 refills | Status: DC
  Filled 2022-06-29: qty 270, 90d supply, fill #0

## 2022-06-29 NOTE — Progress Notes (Signed)
BH MD/PA/NP OP Progress Note Virtual Visit via Video Note  I connected with Mary [name] on 06/29/22 at  1:00 PM EDT by a video enabled telemedicine application and verified that I am speaking with the correct person using two identifiers.  Location: Patient: Home Provider: Clinic   I discussed the limitations of evaluation and management by telemedicine and the availability of in person appointments. The patient expressed understanding and agreed to proceed.  I provided 30 minutes of non-face-to-face time during this encounter.       06/29/2022 3:23 PM Mary [name]  MRN:  [id number]  Chief Complaint:   "I always feels that the Edyth Gunnels is coming to an end"  HPI: 57 year old female seen today for follow up psychiatric evaluation. 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 She is currently being managed on Wellbutrin XL 150 mg daily, Seroquel 50 mg daily, and Depakote 500 mg twice daily.  She informed Clinical research associate that her medications are somewhat effective in managing her psychiatric conditions.  Today she informed writer that she only takes 25 mg of Seroquel so she is able to get up for work.  Today she was well groomed, pleasant, cooperative, and engaged in conversation. She informed Clinical research associate that she feels that the World is coming to an end. She notes that when she wakes up she feels a since of doom. She denies current  life stressors. 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  She reports that she should find satisfaction in life but does not.  She informed Clinical research associate that when she laughs she feels guilty about enjoying life and then begins to cry.  She denies sudden onset of  laughter or crying.    Patient reports that she is anxious.  She informed Clinical research associate that she intentionally does not go out of the house because she feels that people will make her irritable.  She does note that at times she is irritable, distractible, and has racing thoughts.  She reports that she is getting better with budgeting.  She denies other symptoms of mania today provider conducted a GAD-7 and patient scored a 15, at her last visit she scored a 9.  Provider also conducted PHQ-9 and patient scored a 13, at her last visit she scored an 11.  She endorses adequate sleep and appetite.  Today she endorses passive SI but denies wanting to harm himself.    To cope patient notes that she smokes cigarettes.  She reports that she stopped smoking for a while but restarted after starting a new job.  Today she is agreeable to restarting NicoDerm CQ 21 mg patches to help manage tobacco dependence.  Depakote 500 mg twice daily increased to 500 mg 3 times daily to help manage mood.  Patient notes that she will continue to cut Seroquel in in half.  Wellbutrin increased from 150 mg to 300 mg to help depression.  Patient referred to outpatient counseling for therapy.  No other concerns at this time.   Visit Diagnosis:    ICD-10-CM   1. GAD (generalized anxiety disorder)  F41.1 QUEtiapine (SEROQUEL) 50 MG tablet    Ambulatory referral to Social Work    2. Bipolar I disorder, most recent episode depressed  F31.30 buPROPion (WELLBUTRIN XL) 300 MG 24 hr tablet    divalproex (DEPAKOTE) 500 MG DR tablet    QUEtiapine (SEROQUEL) 50 MG tablet    Ambulatory referral to Social Work    3. Tobacco dependence  F17.200 buPROPion (WELLBUTRIN XL) 300 MG 24 hr tablet    nicotine (NICODERM CQ) 21 mg/24hr patch    Ambulatory referral to Social Work      Past Psychiatric History: cocaine dependence (in remission), marijuana dependence (in remission), borderline, schizophrenia substance induced mood disorder, PTSD, and poly  substance use  Past Medical History:  Past Medical History:  Diagnosis Date   Bipolar depression    Borderline personality disorder    COPD (chronic obstructive pulmonary disease)    GERD (gastroesophageal reflux disease)    Hiatal hernia    History of alcohol abuse    per pt none since 10/ 2014   History of attempted suicide    History of drug abuse in remission    per pt in remission since 12-26-2012  polysubstance dependence (alcohol, crack, cocaine, 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   History of hepatitis C per pt first dx 2009 (approx)   consulted w/ dr comer (infectious disease) note 04-26-2016 , pt states was called and told per lab work no longer has hepatitis c    History of kidney stones    Hypercalcemia    mild and long-standing--- pt asymptomatic   Primary hyperparathyroidism    endocrinology --  dr Lucianne Muss   Pruritic erythematous rash    lower legs   PTSD (post-traumatic stress disorder)    Right ureteral stone    Schizophrenia    Vitamin D deficiency     Past Surgical History:  Procedure Laterality Date   CYSTOSCOPY N/A 01/10/2017   Procedure: CYSTOSCOPY;  Surgeon: Hermina Staggers, MD;  Location: [facility];  Service: Gynecology;  Laterality: N/A;   CYSTOSCOPY W/ URETERAL STENT PLACEMENT Right 07/14/2016   Procedure: CYSTOSCOPY WITH STENT REPLACEMENT;  Surgeon: Malen Gauze, MD;  [facility];  Service: Urology;  Laterality: Right;   CYSTOSCOPY WITH RETROGRADE PYELOGRAM, URETEROSCOPY AND STENT PLACEMENT Right 06/28/2016   Procedure: CYSTOSCOPY WITH RETROGRADE PYELOGRAM, URETEROSCOPY AND STENT PLACEMENT;  Surgeon: Malen Gauze, MD;  Location: [facility];  Service: Urology;  Laterality: Right;   CYSTOSCOPY/RETROGRADE/URETEROSCOPY/STONE EXTRACTION WITH BASKET Right 07/14/2016   Procedure: CYSTOSCOPY/RETROGRADE/URETEROSCOPY/STONE EXTRACTION WITH BASKET;  Surgeon: Malen Gauze, MD;  Location: [facility];  Service:  Urology;  Laterality: Right;   DIAGNOSTIC LAPAROSCOPIC LIVER BIOPSY N/A 02/10/2013   Procedure: DIAGNOSTIC LAPAROSCOPIC ;  Surgeon: Ardeth Sportsman, MD;  Location: [facility];  Service: General;  Laterality: N/A;  DIAGNOSTIC LAPAROSCOPY,laparoscopic ventral hernia repair with mesh lysis of adhesions   HERNIA REPAIR     HOLMIUM LASER APPLICATION Right 07/14/2016   Procedure: HOLMIUM LASER APPLICATION;  Surgeon: Malen Gauze, MD;  Location: [facility];  Service: Urology;  Laterality: Right;   SPLENECTOMY  2004   TUBAL LIGATION     VAGINAL HYSTERECTOMY N/A 01/10/2017   Procedure: HYSTERECTOMY VAGINAL W/ BILATERAL SALPINGECTOMY WITH REPAIR OF INCIDENTAL CYSTOTOMY;  Surgeon: Hermina Staggers, MD;  Location: [facility];  Service: Gynecology;  Laterality: N/A;    Family Psychiatric History: Mother heroin use (over dosed and died), sister alcoholic, sister cocaine abuse, and maternal grandmother alcohol use. Five children were born addicted to cocaine.  Family History:  Family History  Problem Relation Age of Onset  Alcohol abuse Mother    Alcohol abuse Father    Cancer Maternal Grandmother        colon   Diabetes Maternal Grandmother    Hypertension Maternal Grandmother    Alcohol abuse Sister    Thyroid disease Neg Hx     Social History:  Social History   Socioeconomic History   Marital status: Single    Spouse name: Not on file   Number of children: Not on file   Years of education: Not on file   Highest education level: Not on file  Occupational History   Not on file  Tobacco Use   Smoking status: Every Day    Packs/day: 1.00    Years: 32.00    Additional pack years: 0.00    Total pack years: 32.00    Types: Cigarettes   Smokeless tobacco: Never  Vaping Use   Vaping Use: Never used  Substance and Sexual Activity   Alcohol use: No    Comment: quit 12/2012   Drug use: No    Types: "Crack" cocaine, Marijuana, Cocaine    Comment: per pt quit 12-26-2012  marijuana 2 times a week   Sexual activity: Yes    Birth control/protection: None  Other Topics Concern   Not on file  Social History Narrative   Not on file   Social Determinants of Health   Financial Resource Strain: Not on file  Food Insecurity: Not on file  Transportation Needs: Not on file  Physical Activity: Not on file  Stress: Not on file  Social Connections: Not on file    Allergies: No Known Allergies  Metabolic Disorder Labs: No results found for: "HGBA1C", "MPG" No results found for: "PROLACTIN" No results found for: "CHOL", "TRIG", "HDL", "CHOLHDL", "VLDL", "LDLCALC" No results found for: "TSH"  Therapeutic Level Labs: No results found for: "LITHIUM" No results found for: "VALPROATE" No results found for: "CBMZ"  Current Medications: Current Outpatient Medications  Medication Sig Dispense Refill   buPROPion (WELLBUTRIN XL) 300 MG 24 hr tablet Take 1 tablet (300 mg total) by mouth every morning. 30 tablet 3   clobetasol cream (TEMOVATE) 0.05 % Apply topically to affected area twice daily for no longer than 2 weeks consistently, then as needed after then. Wash hands well after use. 45 g 0   divalproex (DEPAKOTE) 500 MG DR tablet Take 1 tablet (500 mg total) by mouth 3 (three) times daily. 90 tablet 3   fluconazole (DIFLUCAN) 150 MG tablet Take 1 tablet (150 mg total) by mouth once a week. 2 tablet 0   metroNIDAZOLE (FLAGYL) 500 MG tablet Take 1 tablet (500 mg total) by mouth 2 (two) times daily. 14 tablet 0   nicotine (NICODERM CQ) 21 mg/24hr patch Place 1 patch (21 mg total) onto the skin daily. 28 patch 4   ondansetron (ZOFRAN) 4 MG tablet Take 1 tablet (4 mg total) by mouth every 8 (eight) hours as needed for nausea or vomiting. 10 tablet 0   ondansetron (ZOFRAN-ODT) 8 MG disintegrating tablet Take 1 tablet (8 mg total) by mouth every 8 (eight) hours as needed. 10 tablet 0   penicillin v potassium (VEETID) 500 MG tablet Take 1 tablet (500 mg total) by mouth 4  (four) times daily until gone. 28 tablet 0   QUEtiapine (SEROQUEL) 50 MG tablet Take 1 tablet (50 mg total) by mouth at bedtime. 30 tablet 3   No current facility-administered medications for this visit.     Musculoskeletal: Strength & Muscle  Tone: within normal limits and Telehealth visit Gait & Station:  Unable to assess due to telephone visit Patient leans: N/A  Psychiatric Specialty Exam: Review of Systems  There were no vitals taken for this visit.There is no height or weight on file to calculate BMI.  General Appearance: Well Groomed  Eye Contact:  Good  Speech:  Clear and Coherent and Normal Rate  Volume:  Normal  Mood:  Anxious and Depressed  Affect:  Appropriate and Congruent  Thought Process:  Coherent, Goal Directed and Linear  Orientation:  Full (Time, Place, and Person)  Thought Content: WDL and Logical   Suicidal Thoughts:  No  Homicidal Thoughts:  No  Memory:  Immediate;   Good Recent;   Good Remote;   Good  Judgement:  Good  Insight:  Good  Psychomotor Activity:  Normal  Concentration:  Concentration: Good and Attention Span: Good  Recall:  Good  Fund of Knowledge: Good  Language: Good  Akathisia:  No  Handed:  Right  AIMS (if indicated): Not done  Assets:  Communication Skills Desire for Improvement Financial Resources/Insurance Housing Social Support  ADL's:  Intact  Cognition: WNL  Sleep:  Good   Screenings: GAD-7    Flowsheet Row Video Visit from 06/29/2022 in [facility] Video Visit from 04/06/2022 in [facility] Video Visit from 01/04/2022 in [facility] Video Visit from 03/21/2021 in [facility] Video Visit from 12/06/2020 in [facility]  Total GAD-7 Score 15 9 13 21 18       PHQ2-9    Flowsheet Row Video Visit from 06/29/2022 in [facility] Video Visit from 04/06/2022 in  [facility] Video Visit from 01/04/2022 in [facility] Video Visit from 03/21/2021 in [facility] Video Visit from 12/06/2020 in [facility]  PHQ-2 Total Score 4 4 2 6 4   PHQ-9 Total Score 13 11 10 21 16       Flowsheet Row Video Visit from 06/29/2022 in [facility] ED from 04/06/2022 in [facility] Emergency Department at [facility] ED from 01/21/2022 in [facility] RISK CATEGORY Error: Q7 should not be populated when Q6 is No No Risk No Risk        Assessment and Plan: Patient endorses symptoms of anxiety, depression, and tobacco dependence. To cope patient notes that she smokes cigarettes.  She reports that she stopped smoking for a while but restarted after starting a new job.  Today she is agreeable to restarting NicoDerm CQ 21 mg patches to help manage tobacco dependence.  Depakote 500 mg twice daily increased to 500 mg 3 times daily to help manage mood.  Patient notes that she will continue to cut Seroquel in in half.  1. Bipolar I disorder, most recent episode depressed  Increased- buPROPion (WELLBUTRIN XL) 300 MG 24 hr tablet; Take 1 tablet (300 mg total) by mouth every morning.  Dispense: 30 tablet; Refill: 3 Increased- divalproex (DEPAKOTE) 500 MG DR tablet; Take 1 tablet (500 mg total) by mouth 3 (three) times daily.  Dispense: 90 tablet; Refill: 3 Continue- QUEtiapine (SEROQUEL) 50 MG tablet; Take 1 tablet (50 mg total) by mouth at bedtime.  Dispense: 30 tablet; Refill: 3 - Ambulatory referral to Social Work  2. Tobacco dependence  Continue- buPROPion (WELLBUTRIN XL) 300 MG 24 hr  tablet; Take 1 tablet (300 mg total) by mouth every morning.  Dispense: 30 tablet; Refill: 3 Continue- nicotine (NICODERM CQ) 21 mg/24hr patch; Place 1 patch (21 mg total) onto the skin daily.  Dispense: 28 patch; Refill: 4 -  Ambulatory referral to Social Work  3. GAD (generalized anxiety disorder)  Continue- QUEtiapine (SEROQUEL) 50 MG tablet; Take 1 tablet (50 mg total) by mouth at bedtime.  Dispense: 30 tablet; Refill: 3 - Ambulatory referral to Social Work        Follow up in 2.5 months   Follow up with therapy   Shanna Cisco, NP 06/29/2022, 3:23 PM

## 2022-07-03 ENCOUNTER — Other Ambulatory Visit: Payer: Self-pay

## 2022-07-06 ENCOUNTER — Other Ambulatory Visit: Payer: Self-pay

## 2022-08-09 ENCOUNTER — Other Ambulatory Visit (HOSPITAL_COMMUNITY): Payer: Self-pay | Admitting: Psychiatry

## 2022-08-09 ENCOUNTER — Telehealth (HOSPITAL_COMMUNITY): Payer: Self-pay | Admitting: *Deleted

## 2022-08-09 ENCOUNTER — Ambulatory Visit
Admission: EM | Admit: 2022-08-09 | Discharge: 2022-08-09 | Disposition: A | Discharge status: Home / Self Care | Payer: BC Managed Care – PPO | Attending: Emergency Medicine | Admitting: Emergency Medicine

## 2022-08-09 ENCOUNTER — Other Ambulatory Visit: Payer: Self-pay

## 2022-08-09 DIAGNOSIS — N949 Unspecified condition associated with female genital organs and menstrual cycle: Secondary | ICD-10-CM | POA: Insufficient documentation

## 2022-08-09 DIAGNOSIS — F3130 Bipolar disorder, current episode depressed, mild or moderate severity, unspecified: Secondary | ICD-10-CM

## 2022-08-09 DIAGNOSIS — N898 Other specified noninflammatory disorders of vagina: Secondary | ICD-10-CM | POA: Insufficient documentation

## 2022-08-09 DIAGNOSIS — F411 Generalized anxiety disorder: Secondary | ICD-10-CM

## 2022-08-09 MED ORDER — QUETIAPINE FUMARATE 50 MG PO TABS
50.0000 mg | ORAL_TABLET | Freq: Every day | ORAL | 1 refills | Status: DC
  Filled 2022-08-09: qty 90, 90d supply, fill #0

## 2022-08-09 MED ORDER — METRONIDAZOLE 0.75 % VA GEL
1.0000 | Freq: Every day | VAGINAL | 0 refills | Status: AC
  Filled 2022-08-09: qty 70, 7d supply, fill #0

## 2022-08-09 MED ORDER — ONDANSETRON 4 MG PO TBDP
4.0000 mg | ORAL_TABLET | Freq: Three times a day (TID) | ORAL | 0 refills | Status: DC | PRN
  Filled 2022-08-09: qty 20, 7d supply, fill #0

## 2022-08-09 NOTE — Telephone Encounter (Signed)
Spoke with patient who called asking for 90 day supply of Seroquel stating that it would be much cheaper through her insurance.

## 2022-08-09 NOTE — ED Triage Notes (Addendum)
Pt reports she has a small bump on outside of her vagina x 4 days.   Pt had a gritty feeling when touching her vagina and a fishy odor x 1 day Pt does have unprotected sex.   Pt wants std test with HIV and syphilis included.

## 2022-08-09 NOTE — Discharge Instructions (Signed)
Today you are being treated prophylactically for  Bacterial vaginosis   On exam the bump of concern appears to be a hair bump and presentation is not consistent with herpes which is more likely blister for comfort you may hold warm compresses over the area, may also use Tylenol or Motrin if area becomes enlarged more swollen or painful you may return for reevaluation  Insert MetroGel every night before bed for 7 days  Bacterial vaginosis which results from an overgrowth of one on several organisms that are normally present in your vagina. Vaginosis is an inflammation of the vagina that can result in discharge, itching and pain.  Labs pending 2-3 days, you will be contacted if positive for any sti and treatment will be sent to the pharmacy, you will have to return to the clinic if positive for gonorrhea to receive treatment   Please refrain from having sex until labs results, if positive please refrain from having sex until treatment complete and symptoms resolve   If positive for HIV, Syphilis, Chlamydia  gonorrhea or trichomoniasis please notify partner or partners so they may tested as well  Moving forward, it is recommended you use some form of protection against the transmission of sti infections  such as condoms or dental dams with each sexual encounter     In addition: Avoid baths, hot tubs and whirlpool spas.  Don't use scented or harsh soaps Avoid irritants. These include scented tampons and pads. Wipe from front to back after using the toilet. Don't douche. Your vagina doesn't require cleansing other than normal bathing.  Use a condom.  Wear cotton underwear, this fabric absorbs some moisture.

## 2022-08-09 NOTE — Telephone Encounter (Signed)
Medication refilled and sent to preferred pharmacy

## 2022-08-09 NOTE — ED Provider Notes (Signed)
[facility]    CSN: [id number] Arrival date & time: 08/09/22  1120      History   Chief Complaint No chief complaint on file.   HPI Mary [name] is a 57 y.o. female.   Patient presents for evaluation of a vaginal bump present for 4 days, unable to visualize.  History of shaving but not done recently as she is letting hair grow out.  Denies drainage, pain or fevers.  Has also been experiencing possible vaginal discharge as when she wiped she felt a gritty sensation and there is presence of a vaginal odor.  Denies urinary changes, lower abdominal pain or pressure, flank pain.  Sexually active, no condom use.  Endorses during a recent sexual encounter she did use baby oil as lubrication.    Past Medical History:  Diagnosis Date   Bipolar depression (HCC)    Borderline personality disorder (HCC)    COPD (chronic obstructive pulmonary disease) (HCC)    GERD (gastroesophageal reflux disease)    Hiatal hernia    History of alcohol abuse    per pt none since 10/ 2014   History of attempted suicide    History of drug abuse in remission (HCC)    per pt in remission since 12-26-2012  polysubstance dependence (alcohol, crack, cocaine, 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   History of hepatitis C per pt first dx 2009 (approx)   consulted w/ dr comer (infectious disease) note 04-26-2016 , pt states was called and told per lab work no longer has hepatitis c    History of kidney stones    Hypercalcemia    mild and long-standing--- pt asymptomatic   Primary hyperparathyroidism [facility])    endocrinology --  dr Lucianne Muss   Pruritic erythematous rash    lower legs   PTSD (post-traumatic stress disorder)    Right ureteral stone    Schizophrenia (HCC)    Vitamin D deficiency     Patient Active Problem List   Diagnosis Date Noted   Tobacco dependence 12/06/2020   GAD (generalized anxiety disorder) 11/11/2019   Bipolar I disorder, most recent episode depressed (HCC) 11/11/2019    Post-operative state 01/10/2017   Ureteral calculus 06/28/2016   Pruritic erythematous rash 04/12/2016   Reactive airway disease that is not asthma 04/12/2016   Chronic hepatitis C without hepatic coma (HCC)    Drug abuse and dependence (HCC)    Incarcerated incisional hernia s/p lap repair 02/10/2013 02/10/2013   Polysubstance dependence (HCC) 10/16/2011   Substance induced mood disorder (HCC) 10/16/2011   GERD without esophagitis 07/04/2011    Past Surgical History:  Procedure Laterality Date   CYSTOSCOPY N/A 01/10/2017   Procedure: CYSTOSCOPY;  Surgeon: Hermina Staggers, MD;  Location: [facility];  Service: Gynecology;  Laterality: N/A;   CYSTOSCOPY W/ URETERAL STENT PLACEMENT Right 07/14/2016   Procedure: CYSTOSCOPY WITH STENT REPLACEMENT;  Surgeon: Malen Gauze, MD;  Location: [facility];  Service: Urology;  Laterality: Right;   CYSTOSCOPY WITH RETROGRADE PYELOGRAM, URETEROSCOPY AND STENT PLACEMENT Right 06/28/2016   Procedure: CYSTOSCOPY WITH RETROGRADE PYELOGRAM, URETEROSCOPY AND STENT PLACEMENT;  Surgeon: Malen Gauze, MD;  Location: [facility];  Service: Urology;  Laterality: Right;   CYSTOSCOPY/RETROGRADE/URETEROSCOPY/STONE EXTRACTION WITH BASKET Right 07/14/2016   Procedure: CYSTOSCOPY/RETROGRADE/URETEROSCOPY/STONE EXTRACTION WITH BASKET;  Surgeon: Malen Gauze, MD;  Location: [facility];  Service: Urology;  Laterality: Right;   DIAGNOSTIC LAPAROSCOPIC LIVER BIOPSY N/A 02/10/2013   Procedure: DIAGNOSTIC LAPAROSCOPIC ;  Surgeon: Ardeth Sportsman, MD;  Location: [facility];  Service: General;  Laterality: N/A;  DIAGNOSTIC LAPAROSCOPY,laparoscopic ventral hernia repair with mesh lysis of adhesions   HERNIA REPAIR     HOLMIUM LASER APPLICATION Right 07/14/2016   Procedure: HOLMIUM LASER APPLICATION;  Surgeon: Malen Gauze, MD;  Location: [facility];  Service: Urology;  Laterality: Right;   SPLENECTOMY  2004   TUBAL LIGATION      VAGINAL HYSTERECTOMY N/A 01/10/2017   Procedure: HYSTERECTOMY VAGINAL W/ BILATERAL SALPINGECTOMY WITH REPAIR OF INCIDENTAL CYSTOTOMY;  Surgeon: Hermina Staggers, MD;  Location: [facility];  Service: Gynecology;  Laterality: N/A;    OB History     Gravida  5   Para      Term      Preterm      AB      Living  5      SAB      IAB      Ectopic      Multiple      Live Births  5            Home Medications    Prior to Admission medications   Medication Sig Start Date End Date Taking? Authorizing Provider  buPROPion (WELLBUTRIN XL) 300 MG 24 hr tablet Take 1 tablet (300 mg total) by mouth every morning. 06/29/22   Shanna Cisco, NP  clobetasol cream (TEMOVATE) 0.05 % Apply topically to affected area twice daily for no longer than 2 weeks consistently, then as needed after then. Wash hands well after use. 01/21/22   Allwardt, Crist Infante, PA-C  divalproex (DEPAKOTE) 500 MG DR tablet Take 1 tablet (500 mg total) by mouth 3 (three) times daily. 06/29/22   Shanna Cisco, NP  fluconazole (DIFLUCAN) 150 MG tablet Take 1 tablet (150 mg total) by mouth once a week. 08/01/21   Particia Nearing, PA-C  metroNIDAZOLE (FLAGYL) 500 MG tablet Take 1 tablet (500 mg total) by mouth 2 (two) times daily. 01/24/22   Lamptey, Britta Mccreedy, MD  nicotine (NICODERM CQ) 21 mg/24hr patch Place 1 patch (21 mg total) onto the skin daily. 06/29/22   Shanna Cisco, NP  ondansetron (ZOFRAN) 4 MG tablet Take 1 tablet (4 mg total) by mouth every 8 (eight) hours as needed for nausea or vomiting. 01/21/22   Allwardt, Alyssa M, PA-C  ondansetron (ZOFRAN-ODT) 8 MG disintegrating tablet Take 1 tablet (8 mg total) by mouth every 8 (eight) hours as needed. 07/09/21   Molpus, John, MD  penicillin v potassium (VEETID) 500 MG tablet Take 1 tablet (500 mg total) by mouth 4 (four) times daily until gone. 06/08/22     QUEtiapine (SEROQUEL) 50 MG tablet Take 1 tablet (50 mg total) by mouth at bedtime. 06/29/22    Shanna Cisco, NP  fluticasone-salmeterol (ADVAIR HFA) 230-21 MCG/ACT inhaler Inhale 2 puffs into the lungs 2 (two) times daily. Rinse mouth after each use Patient not taking: Reported on 02/14/2017 07/04/16 07/25/19  Nche, Bonna Gains, NP    Family History Family History  Problem Relation Age of Onset   Alcohol abuse Mother    Alcohol abuse Father    Cancer Maternal Grandmother        colon   Diabetes Maternal Grandmother    Hypertension Maternal Grandmother    Alcohol abuse Sister    Thyroid disease Neg Hx     Social History Social History   Tobacco Use   Smoking status: Every Day  Packs/day: 1.00    Years: 32.00    Additional pack years: 0.00    Total pack years: 32.00    Types: Cigarettes   Smokeless tobacco: Never  Vaping Use   Vaping Use: Never used  Substance Use Topics   Alcohol use: No    Comment: quit 12/2012   Drug use: No    Types: "Crack" cocaine, Marijuana, Cocaine    Comment: per pt quit 12-26-2012 marijuana 2 times a week     Allergies   Patient has no known allergies.   Review of Systems Review of Systems   Physical Exam Triage Vital Signs ED Triage Vitals [08/09/22 1137]  Enc Vitals Group     BP 122/69     Pulse Rate 61     Resp 18     Temp 98 F (36.7 C)     Temp Source Oral     SpO2 97 %     Weight      Height      Head Circumference      Peak Flow      Pain Score 0     Pain Loc      Pain Edu?      Excl. in GC?    No data found.  Updated Vital Signs BP 122/69 (BP Location: Left Arm)   Pulse 61   Temp 98 F (36.7 C) (Oral)   Resp 18   LMP  (LMP Unknown)   SpO2 97%   Visual Acuity Right Eye Distance:   Left Eye Distance:   Bilateral Distance:    Right Eye Near:   Left Eye Near:    Bilateral Near:     Physical Exam Constitutional:      Appearance: Normal appearance.  Eyes:     Extraocular Movements: Extraocular movements intact.  Pulmonary:     Effort: Pulmonary effort is normal.  Neurological:      Mental Status: She is alert and oriented to person, place, and time. Mental status is at baseline.      UC Treatments / Results  Labs (all labs ordered are listed, but only abnormal results are displayed) Labs Reviewed  RPR  HIV ANTIBODY (ROUTINE TESTING W REFLEX)  CERVICOVAGINAL ANCILLARY ONLY    EKG   Radiology No results found.  Procedures Procedures (including critical care time)  Medications Ordered in UC Medications - No data to display  Initial Impression / Assessment and Plan / UC Course  I have reviewed the triage vital signs and the nursing notes.  Pertinent labs & imaging results that were available during my care of the patient were reviewed by me and considered in my medical decision making (see chart for details).  Genital lesion, vaginal discharge  Genital lesion appears to be consistent with a here about, discussed with patient, not blisterlike, no concern for herpes at this time, prophylactically treating for bacterial vaginosis based on symptomology, MetroGel prescribed, STI labs are pending, will treat further per protocol, Zofran prescribed for supportive care as she has been experiencing nausea without vomiting, STI labs pending will treat per protocol, advised abstinence until lab results, and/or treatment is complete, advised condom use during all sexual encounters moving, may follow-up with urgent care as needed  Final Clinical Impressions(s) / UC Diagnoses   Final diagnoses:  Genital lesion, female   Discharge Instructions   None    ED Prescriptions   None    PDMP not reviewed this encounter.   Valinda Hoar, NP  08/09/22 1258  

## 2022-08-10 LAB — CERVICOVAGINAL ANCILLARY ONLY
Bacterial Vaginitis (gardnerella): NEGATIVE
Candida Glabrata: NEGATIVE
Candida Vaginitis: NEGATIVE
Chlamydia: NEGATIVE
Comment: NEGATIVE
Comment: NEGATIVE
Comment: NEGATIVE
Comment: NEGATIVE
Comment: NEGATIVE
Comment: NORMAL
Neisseria Gonorrhea: NEGATIVE
Trichomonas: NEGATIVE

## 2022-08-10 LAB — RPR: RPR Ser Ql: REACTIVE — AB

## 2022-08-10 LAB — HIV ANTIBODY (ROUTINE TESTING W REFLEX): HIV Screen 4th Generation wRfx: NONREACTIVE

## 2022-08-10 LAB — RPR, QUANT+TP ABS (REFLEX)
Rapid Plasma Reagin, Quant: 1:2 {titer} — ABNORMAL HIGH
T Pallidum Abs: REACTIVE — AB

## 2022-08-11 ENCOUNTER — Telehealth (HOSPITAL_COMMUNITY): Payer: Self-pay | Admitting: Psychiatry

## 2022-08-11 ENCOUNTER — Other Ambulatory Visit: Payer: Self-pay

## 2022-08-15 NOTE — Telephone Encounter (Signed)
Patient informed Clinical research associate that [facility] informed her that 30 pills would be $30.  She notes that it was cheaper to get a 90-day supply which was also $30.  Provider sent a 90-day supply of Seroquel to [facility] at her next visit.  Patient reports that she is fine with paying $30 for 90-day supplies.  Provider endorsed understanding and agreed.  No other concerns noted at this time.

## 2022-08-21 ENCOUNTER — Ambulatory Visit (HOSPITAL_COMMUNITY): Payer: BC Managed Care – PPO | Admitting: Clinical

## 2022-09-07 ENCOUNTER — Other Ambulatory Visit: Payer: Self-pay

## 2022-09-07 ENCOUNTER — Encounter (HOSPITAL_COMMUNITY): Payer: Self-pay

## 2022-09-07 ENCOUNTER — Other Ambulatory Visit (HOSPITAL_COMMUNITY): Payer: Self-pay | Admitting: Psychiatry

## 2022-09-07 ENCOUNTER — Telehealth (HOSPITAL_COMMUNITY): Payer: BC Managed Care – PPO | Admitting: Psychiatry

## 2022-09-07 DIAGNOSIS — F17200 Nicotine dependence, unspecified, uncomplicated: Secondary | ICD-10-CM

## 2022-09-07 DIAGNOSIS — F411 Generalized anxiety disorder: Secondary | ICD-10-CM

## 2022-09-07 DIAGNOSIS — F3130 Bipolar disorder, current episode depressed, mild or moderate severity, unspecified: Secondary | ICD-10-CM

## 2022-09-07 MED ORDER — DIVALPROEX SODIUM 500 MG PO DR TAB
500.0000 mg | DELAYED_RELEASE_TABLET | Freq: Three times a day (TID) | ORAL | 3 refills | Status: DC
  Filled 2022-09-07: qty 90, 30d supply, fill #0

## 2022-09-07 MED ORDER — QUETIAPINE FUMARATE 50 MG PO TABS
50.0000 mg | ORAL_TABLET | Freq: Every day | ORAL | 1 refills | Status: DC
  Filled 2022-09-07: qty 90, 90d supply, fill #0

## 2022-09-07 MED ORDER — BUPROPION HCL ER (XL) 300 MG PO TB24
300.0000 mg | ORAL_TABLET | Freq: Every morning | ORAL | 3 refills | Status: DC
  Filled 2022-09-07: qty 30, 30d supply, fill #0

## 2022-09-07 NOTE — Telephone Encounter (Signed)
Patient informed Clinical research associate that she needs to reschedule her appointment today.  She notes that she was late for an engagement.  Appointment rescheduled for 09/13/2022 at 3:30.  Patient reports that she cannot afford her Wellbutrin or Depakote.  Provider recommended sending them [facility].  Patient was agreeable to this.

## 2022-09-13 ENCOUNTER — Other Ambulatory Visit: Payer: Self-pay

## 2022-09-13 ENCOUNTER — Telehealth (HOSPITAL_COMMUNITY): Payer: Self-pay | Admitting: Psychiatry

## 2022-09-27 ENCOUNTER — Telehealth (INDEPENDENT_AMBULATORY_CARE_PROVIDER_SITE_OTHER): Payer: BC Managed Care – PPO | Admitting: Psychiatry

## 2022-09-27 ENCOUNTER — Other Ambulatory Visit: Payer: Self-pay

## 2022-09-27 ENCOUNTER — Encounter (HOSPITAL_COMMUNITY): Payer: Self-pay | Admitting: Psychiatry

## 2022-09-27 DIAGNOSIS — F411 Generalized anxiety disorder: Secondary | ICD-10-CM | POA: Diagnosis not present

## 2022-09-27 DIAGNOSIS — F17200 Nicotine dependence, unspecified, uncomplicated: Secondary | ICD-10-CM

## 2022-09-27 DIAGNOSIS — F3130 Bipolar disorder, current episode depressed, mild or moderate severity, unspecified: Secondary | ICD-10-CM | POA: Diagnosis not present

## 2022-09-27 MED ORDER — BUPROPION HCL ER (XL) 300 MG PO TB24
300.0000 mg | ORAL_TABLET | Freq: Every morning | ORAL | 3 refills | Status: DC
  Filled 2022-09-27: qty 30, 30d supply, fill #0

## 2022-09-27 MED ORDER — NICOTINE 21 MG/24HR TD PT24
21.0000 mg | MEDICATED_PATCH | Freq: Every day | TRANSDERMAL | 4 refills | Status: DC
  Filled 2022-09-27: qty 28, 28d supply, fill #0

## 2022-09-27 MED ORDER — QUETIAPINE FUMARATE 50 MG PO TABS
50.0000 mg | ORAL_TABLET | Freq: Every day | ORAL | 1 refills | Status: DC
  Filled 2022-09-27: qty 90, 90d supply, fill #0
  Filled 2022-11-09: qty 30, 30d supply, fill #0
  Filled 2022-12-11: qty 30, 30d supply, fill #1

## 2022-09-27 MED ORDER — DIVALPROEX SODIUM 500 MG PO DR TAB
500.0000 mg | DELAYED_RELEASE_TABLET | Freq: Three times a day (TID) | ORAL | 3 refills | Status: DC
  Filled 2022-09-27: qty 90, 30d supply, fill #0

## 2022-09-27 NOTE — Progress Notes (Signed)
BH MD/PA/NP OP Progress Note Virtual Visit via Video Note  I connected with Mary [name] on 09/27/22 at  9:30 AM EDT by a video enabled telemedicine application and verified that I am speaking with the correct person using two identifiers.  Location: Patient: Home Provider: Clinic   I discussed the limitations of evaluation and management by telemedicine and the availability of in person appointments. The patient expressed understanding and agreed to proceed.  I provided 30 minutes of non-face-to-face time during this encounter.       09/27/2022 10:20 AM Mary [name]  MRN:  [id number]  Chief Complaint:   "I haven't been able to pay for my medication"  HPI: 57 year old female seen today for follow up psychiatric evaluation. 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 She is currently being managed on Wellbutrin XL 300 mg daily, Seroquel 25 mg daily, and Depakote 500 mg three time daily.  She informed Clinical research associate that she has not been able to get the her new prescription.  Today she was well groomed, pleasant, cooperative, and engaged in conversation. She informed Clinical research associate that the last two weeks her depression has improved. She was not able to increase her medication because se could not afford it. She notes that her mood continues to fluctuate, has racing thoughts, increased irritability, and impulsive spending. She also notes that recently she quit her job at [employer] and continues to work at [employer] but has quit several other positions. Patient notes that her sex drive has recently reduced but notes at times she has had multiple sexual partners. She reports that she avoids having relationships because of her mood.   Since her last visit she notes that she reconnected with her sister who she had not  seen over 10 years. She notes that her sister is homeless and lives in [place].  She reports that her sister needs money however informed her that she is unable to help her financially.    Today provider conducted a GAD-7 and patient scored a 12, at her last visit she scored a 15.  Provider also conducted PHQ-9 patient 6, at her last visit she scored a 13.  She endorsed adequate sleep and appetite. Patient endorses passive SI but denies wanting to harm herself.  Today she denies SI/HI/VAH, mania, paranoia.    Today Depakote 500 mg twice daily increased to 500 mg 3 times daily to help manage mood.  Patient notes that she will continue to cut Seroquel in in half to equal 25 mg.  Wellbutrin increased from 150 mg to 300 mg to help depression.  No other concerns at this time.   Visit Diagnosis:    ICD-10-CM   1. Bipolar I disorder, most recent episode depressed (HCC)  F31.30 buPROPion (WELLBUTRIN XL) 300 MG 24 hr tablet    divalproex (DEPAKOTE) 500 MG DR tablet    QUEtiapine (SEROQUEL) 50 MG tablet    2. Tobacco dependence  F17.200 buPROPion (WELLBUTRIN XL) 300 MG 24 hr tablet    nicotine (NICODERM CQ) 21 mg/24hr patch    3. GAD (generalized anxiety disorder)  F41.1 QUEtiapine (SEROQUEL) 50 MG tablet       Past Psychiatric History: cocaine dependence (in remission), marijuana dependence (in remission), borderline, schizophrenia substance induced mood disorder, PTSD, and poly substance use  Past Medical History:  Past Medical History:  Diagnosis  Date   Bipolar depression (HCC)    Borderline personality disorder (HCC)    COPD (chronic obstructive pulmonary disease) (HCC)    GERD (gastroesophageal reflux disease)    Hiatal hernia    History of alcohol abuse    per pt none since 10/ 2014   History of attempted suicide    History of drug abuse in remission (HCC)    per pt in remission since 12-26-2012  polysubstance dependence (alcohol, crack, cocaine, cannubus)  per pt born w/  heroin addiction   History of hepatitis C per pt first dx 2009 (approx)   consulted w/ dr comer (infectious disease) note 04-26-2016 , pt states was called and told per lab work no longer has hepatitis c    History of kidney stones    Hypercalcemia    mild and long-standing--- pt asymptomatic   Primary hyperparathyroidism [facility])    endocrinology --  dr Lucianne Muss   Pruritic erythematous rash    lower legs   PTSD (post-traumatic stress disorder)    Right ureteral stone    Schizophrenia (HCC)    Vitamin D deficiency     Past Surgical History:  Procedure Laterality Date   CYSTOSCOPY N/A 01/10/2017   Procedure: CYSTOSCOPY;  Surgeon: Hermina Staggers, MD;  Location: [facility];  Service: Gynecology;  Laterality: N/A;   CYSTOSCOPY W/ URETERAL STENT PLACEMENT Right 07/14/2016   Procedure: CYSTOSCOPY WITH STENT REPLACEMENT;  Surgeon: Malen Gauze, MD;  Location: [facility];  Service: Urology;  Laterality: Right;   CYSTOSCOPY WITH RETROGRADE PYELOGRAM, URETEROSCOPY AND STENT PLACEMENT Right 06/28/2016   Procedure: CYSTOSCOPY WITH RETROGRADE PYELOGRAM, URETEROSCOPY AND STENT PLACEMENT;  Surgeon: Malen Gauze, MD;  Location: [facility];  Service: Urology;  Laterality: Right;   CYSTOSCOPY/RETROGRADE/URETEROSCOPY/STONE EXTRACTION WITH BASKET Right 07/14/2016   Procedure: CYSTOSCOPY/RETROGRADE/URETEROSCOPY/STONE EXTRACTION WITH BASKET;  Surgeon: Malen Gauze, MD;  Location: [facility];  Service: Urology;  Laterality: Right;   DIAGNOSTIC LAPAROSCOPIC LIVER BIOPSY N/A 02/10/2013   Procedure: DIAGNOSTIC LAPAROSCOPIC ;  Surgeon: Ardeth Sportsman, MD;  Location: [facility];  Service: General;  Laterality: N/A;  DIAGNOSTIC LAPAROSCOPY,laparoscopic ventral hernia repair with mesh lysis of adhesions   HERNIA REPAIR     HOLMIUM LASER APPLICATION Right 07/14/2016   Procedure: HOLMIUM LASER APPLICATION;  Surgeon: Malen Gauze, MD;  Location: [facility];  Service:  Urology;  Laterality: Right;   SPLENECTOMY  2004   TUBAL LIGATION     VAGINAL HYSTERECTOMY N/A 01/10/2017   Procedure: HYSTERECTOMY VAGINAL W/ BILATERAL SALPINGECTOMY WITH REPAIR OF INCIDENTAL CYSTOTOMY;  Surgeon: Hermina Staggers, MD;  Location: [facility];  Service: Gynecology;  Laterality: N/A;    Family Psychiatric History: Mother heroin use (over dosed and died), sister alcoholic, sister cocaine abuse, and maternal grandmother alcohol use. Five children were born addicted to cocaine.  Family History:  Family History  Problem Relation Age of Onset   Alcohol abuse Mother    Alcohol abuse Father    Cancer Maternal Grandmother        colon   Diabetes Maternal Grandmother    Hypertension Maternal Grandmother    Alcohol abuse Sister    Thyroid disease Neg Hx     Social History:  Social History   Socioeconomic History   Marital status: Single    Spouse name: Not on file   Number of children: Not on file   Years of education: Not on file   Highest education level: Not on file  Occupational History   Not on file  Tobacco Use   Smoking status: Every Day    Current packs/day: 1.00    Average packs/day: 1 pack/day for 32.0 years (32.0 ttl pk-yrs)    Types: Cigarettes   Smokeless tobacco: Never  Vaping Use   Vaping status: Never Used  Substance and Sexual Activity   Alcohol use: No    Comment: quit 12/2012   Drug use: No    Types: "Crack" cocaine, Marijuana, Cocaine    Comment: per pt quit 12-26-2012 marijuana 2 times a week   Sexual activity: Yes    Birth control/protection: None  Other Topics Concern   Not on file  Social History Narrative   Not on file   Social Determinants of Health   Financial Resource Strain: Not on file  Food Insecurity: Not on file  Transportation Needs: Not on file  Physical Activity: Not on file  Stress: Not on file  Social Connections: Not on file    Allergies: No Known Allergies  Metabolic Disorder Labs: No results found for:  "HGBA1C", "MPG" No results found for: "PROLACTIN" No results found for: "CHOL", "TRIG", "HDL", "CHOLHDL", "VLDL", "LDLCALC" No results found for: "TSH"  Therapeutic Level Labs: No results found for: "LITHIUM" No results found for: "VALPROATE" No results found for: "CBMZ"  Current Medications: Current Outpatient Medications  Medication Sig Dispense Refill   buPROPion (WELLBUTRIN XL) 300 MG 24 hr tablet Take 1 tablet (300 mg total) by mouth every morning. 30 tablet 3   clobetasol cream (TEMOVATE) 0.05 % Apply topically to affected area twice daily for no longer than 2 weeks consistently, then as needed after then. Wash hands well after use. 45 g 0   divalproex (DEPAKOTE) 500 MG DR tablet Take 1 tablet (500 mg total) by mouth 3 (three) times daily. 90 tablet 3   fluconazole (DIFLUCAN) 150 MG tablet Take 1 tablet (150 mg total) by mouth once a week. 2 tablet 0   metroNIDAZOLE (FLAGYL) 500 MG tablet Take 1 tablet (500 mg total) by mouth 2 (two) times daily. 14 tablet 0   nicotine (NICODERM CQ) 21 mg/24hr patch Place 1 patch (21 mg total) onto the skin daily. 28 patch 4   ondansetron (ZOFRAN) 4 MG tablet Take 1 tablet (4 mg total) by mouth every 8 (eight) hours as needed for nausea or vomiting. 10 tablet 0   ondansetron (ZOFRAN-ODT) 4 MG disintegrating tablet Take 1 tablet (4 mg total) by mouth every 8 (eight) hours as needed for nausea or vomiting. 20 tablet 0   penicillin v potassium (VEETID) 500 MG tablet Take 1 tablet (500 mg total) by mouth 4 (four) times daily until gone. 28 tablet 0   QUEtiapine (SEROQUEL) 50 MG tablet Take 1 tablet (50 mg total) by mouth at bedtime. 90 tablet 1   No current facility-administered medications for this visit.     Musculoskeletal: Strength & Muscle Tone: within normal limits and Telehealth visit Gait & Station:  Unable to assess due to telephone visit Patient leans: N/A  Psychiatric Specialty Exam: Review of Systems  There were no vitals taken for  this visit.There is no height or weight on file to calculate BMI.  General Appearance: Well Groomed  Eye Contact:  Good  Speech:  Clear and Coherent and Normal Rate  Volume:  Normal  Mood:  Anxious  Affect:  Appropriate and Congruent  Thought Process:  Coherent, Goal Directed and Linear  Orientation:  Full (Time, Place, and Person)  Thought  Content: WDL and Logical   Suicidal Thoughts:  Yes.  without intent/plan  Homicidal Thoughts:  No  Memory:  Immediate;   Good Recent;   Good Remote;   Good  Judgement:  Good  Insight:  Good  Psychomotor Activity:  Normal  Concentration:  Concentration: Good and Attention Span: Good  Recall:  Good  Fund of Knowledge: Good  Language: Good  Akathisia:  No  Handed:  Right  AIMS (if indicated): Not done  Assets:  Communication Skills Desire for Improvement Financial Resources/Insurance Housing Social Support  ADL's:  Intact  Cognition: WNL  Sleep:  Good   Screenings: GAD-7    Flowsheet Row Video Visit from 09/27/2022 in [facility] Video Visit from 06/29/2022 in [facility] Video Visit from 04/06/2022 in [facility] Video Visit from 01/04/2022 in [facility] Video Visit from 03/21/2021 in [facility]  Total GAD-7 Score 12 15 9 13 21       PHQ2-9    Flowsheet Row Video Visit from 09/27/2022 in [facility] Video Visit from 06/29/2022 in [facility] Video Visit from 04/06/2022 in [facility] Video Visit from 01/04/2022 in [facility] Video Visit from 03/21/2021 in [facility]  PHQ-2 Total Score 3 4 4 2 6   PHQ-9 Total Score 6 13 11 10 21       Flowsheet Row Video Visit from 09/27/2022 in [facility] ED from 08/09/2022 in [facility] Video Visit from 06/29/2022 in [facility]  C-SSRS RISK CATEGORY Error: Q7 should not be populated when Q6 is No Error: Q7 should not be populated when Q6 is No Error: Q7 should not be populated when Q6 is No        Assessment and Plan: Patient endorses symptoms of anxiety and hypomania. She was unable to pick up her medications due to finances. Patient taking Wellbutrin XL 150 mg, Depakote 500 mg twice daily, and Seroquel 25 mg. Alberteen Sam, Tilman Neat - There's Nothing Better Than Love   1. Bipolar I disorder, most recent episode depressed  Increased- buPROPion (WELLBUTRIN XL) 300 MG 24 hr tablet; Take 1 tablet (300 mg total) by mouth every morning.  Dispense: 30 tablet; Refill: 3 Increased- divalproex (DEPAKOTE) 500 MG DR tablet; Take 1 tablet (500 mg total) by mouth 3 (three) times daily.  Dispense: 90 tablet; Refill: 3 Continue- QUEtiapine (SEROQUEL) 50 MG tablet; Take 1 tablet (50 mg total) by mouth at bedtime.  Dispense: 30 tablet; Refill: 3   2. Tobacco dependence  Continue- buPROPion (WELLBUTRIN XL) 300 MG 24 hr tablet; Take 1 tablet (300 mg total) by mouth every morning.  Dispense: 30 tablet; Refill: 3 Continue- nicotine (NICODERM CQ) 21 mg/24hr patch; Place 1 patch (21 mg total) onto the skin daily.  Dispense: 28 patch; Refill: 4   3. GAD (generalized anxiety disorder)  Continue- QUEtiapine (SEROQUEL) 50 MG tablet; Take 1 tablet (50 mg total) by mouth at bedtime.  Dispense: 30 tablet; Refill: 3         Follow up in 3 months   Follow up with therapy   Shanna Cisco, NP 09/27/2022, 10:20 AM

## 2022-10-03 ENCOUNTER — Other Ambulatory Visit: Payer: Self-pay

## 2022-10-04 ENCOUNTER — Other Ambulatory Visit: Payer: Self-pay

## 2022-10-04 ENCOUNTER — Encounter (HOSPITAL_COMMUNITY): Payer: Self-pay

## 2022-10-04 ENCOUNTER — Emergency Department (HOSPITAL_COMMUNITY): Payer: BC Managed Care – PPO

## 2022-10-04 ENCOUNTER — Emergency Department (HOSPITAL_COMMUNITY)
Admission: EM | Admit: 2022-10-04 | Discharge: 2022-10-04 | Disposition: A | Discharge status: Home / Self Care | Payer: BC Managed Care – PPO | Attending: Student | Admitting: Student

## 2022-10-04 DIAGNOSIS — R109 Unspecified abdominal pain: Secondary | ICD-10-CM | POA: Diagnosis not present

## 2022-10-04 DIAGNOSIS — Z1152 Encounter for screening for COVID-19: Secondary | ICD-10-CM | POA: Insufficient documentation

## 2022-10-04 DIAGNOSIS — Z23 Encounter for immunization: Secondary | ICD-10-CM | POA: Diagnosis not present

## 2022-10-04 DIAGNOSIS — R110 Nausea: Secondary | ICD-10-CM

## 2022-10-04 DIAGNOSIS — W450XXA Nail entering through skin, initial encounter: Secondary | ICD-10-CM | POA: Diagnosis not present

## 2022-10-04 DIAGNOSIS — J432 Centrilobular emphysema: Secondary | ICD-10-CM | POA: Diagnosis not present

## 2022-10-04 DIAGNOSIS — D351 Benign neoplasm of parathyroid gland: Secondary | ICD-10-CM | POA: Diagnosis not present

## 2022-10-04 DIAGNOSIS — R112 Nausea with vomiting, unspecified: Secondary | ICD-10-CM | POA: Diagnosis not present

## 2022-10-04 DIAGNOSIS — J449 Chronic obstructive pulmonary disease, unspecified: Secondary | ICD-10-CM | POA: Diagnosis not present

## 2022-10-04 DIAGNOSIS — F17210 Nicotine dependence, cigarettes, uncomplicated: Secondary | ICD-10-CM | POA: Insufficient documentation

## 2022-10-04 DIAGNOSIS — R634 Abnormal weight loss: Secondary | ICD-10-CM | POA: Diagnosis not present

## 2022-10-04 DIAGNOSIS — R0789 Other chest pain: Secondary | ICD-10-CM | POA: Diagnosis not present

## 2022-10-04 DIAGNOSIS — R001 Bradycardia, unspecified: Secondary | ICD-10-CM | POA: Diagnosis not present

## 2022-10-04 DIAGNOSIS — S99922A Unspecified injury of left foot, initial encounter: Secondary | ICD-10-CM | POA: Diagnosis not present

## 2022-10-04 LAB — COMPREHENSIVE METABOLIC PANEL
ALT: 16 U/L (ref 0–44)
AST: 20 U/L (ref 15–41)
Albumin: 3.9 g/dL (ref 3.5–5.0)
Alkaline Phosphatase: 84 U/L (ref 38–126)
Anion gap: 7 (ref 5–15)
BUN: 9 mg/dL (ref 6–20)
CO2: 26 mmol/L (ref 22–32)
Calcium: 10.3 mg/dL (ref 8.9–10.3)
Chloride: 105 mmol/L (ref 98–111)
Creatinine, Ser: 0.85 mg/dL (ref 0.44–1.00)
GFR, Estimated: >60 mL/min (ref >60)
Glucose, Bld: 97 mg/dL (ref 70–99)
Potassium: 4 mmol/L (ref 3.5–5.1)
Sodium: 138 mmol/L (ref 135–145)
Total Bilirubin: 0.9 mg/dL (ref 0.3–1.2)
Total Protein: 6.8 g/dL (ref 6.5–8.1)

## 2022-10-04 LAB — CBC
HCT: 37.5 % (ref 36.0–46.0)
Hemoglobin: 12.6 g/dL (ref 12.0–15.0)
MCH: 30.7 pg (ref 26.0–34.0)
MCHC: 33.6 g/dL (ref 30.0–36.0)
MCV: 91.2 fL (ref 80.0–100.0)
Platelets: 277 10*3/uL (ref 150–400)
RBC: 4.11 MIL/uL (ref 3.87–5.11)
RDW: 13.1 % (ref 11.5–15.5)
WBC: 11.1 10*3/uL — ABNORMAL HIGH (ref 4.0–10.5)
nRBC: 0 % (ref 0.0–0.2)

## 2022-10-04 LAB — URINALYSIS, ROUTINE W REFLEX MICROSCOPIC
Bilirubin Urine: NEGATIVE
Glucose, UA: NEGATIVE mg/dL
Hgb urine dipstick: NEGATIVE
Ketones, ur: NEGATIVE mg/dL
Leukocytes,Ua: NEGATIVE
Nitrite: NEGATIVE
Protein, ur: NEGATIVE mg/dL
Specific Gravity, Urine: 1.018 (ref 1.005–1.030)
pH: 6 (ref 5.0–8.0)

## 2022-10-04 LAB — RESP PANEL BY RT-PCR (RSV, FLU A&B, COVID)  RVPGX2
Influenza A by PCR: NEGATIVE
Influenza B by PCR: NEGATIVE
Resp Syncytial Virus by PCR: NEGATIVE
SARS Coronavirus 2 by RT PCR: NEGATIVE

## 2022-10-04 LAB — TROPONIN I (HIGH SENSITIVITY): Troponin I (High Sensitivity): 3 ng/L (ref <18)

## 2022-10-04 LAB — TSH: TSH: 1.275 u[IU]/mL (ref 0.350–4.500)

## 2022-10-04 LAB — PHOSPHORUS: Phosphorus: 2.9 mg/dL (ref 2.5–4.6)

## 2022-10-04 LAB — LIPASE, BLOOD: Lipase: 34 U/L (ref 11–51)

## 2022-10-04 MED ORDER — ALUM & MAG HYDROXIDE-SIMETH 200-200-20 MG/5ML PO SUSP
30.0000 mL | Freq: Once | ORAL | Status: AC
  Administered 2022-10-04: 30 mL via ORAL
  Filled 2022-10-04: qty 30

## 2022-10-04 MED ORDER — LIDOCAINE VISCOUS HCL 2 % MT SOLN
15.0000 mL | Freq: Once | OROMUCOSAL | Status: AC
  Administered 2022-10-04: 15 mL via ORAL
  Filled 2022-10-04: qty 15

## 2022-10-04 MED ORDER — LACTATED RINGERS IV BOLUS
1000.0000 mL | Freq: Once | INTRAVENOUS | Status: AC
  Administered 2022-10-04: 1000 mL via INTRAVENOUS

## 2022-10-04 MED ORDER — IOHEXOL 350 MG/ML SOLN
75.0000 mL | Freq: Once | INTRAVENOUS | Status: AC | PRN
  Administered 2022-10-04: 75 mL via INTRAVENOUS

## 2022-10-04 MED ORDER — CIPROFLOXACIN HCL 500 MG PO TABS: 500.0000 mg | ORAL_TABLET | Freq: Two times a day (BID) | ORAL | 0 refills | Status: AC

## 2022-10-04 MED ORDER — CIPROFLOXACIN HCL 500 MG PO TABS
500.0000 mg | ORAL_TABLET | Freq: Once | ORAL | Status: AC
  Administered 2022-10-04: 500 mg via ORAL
  Filled 2022-10-04: qty 1

## 2022-10-04 MED ORDER — KETOROLAC TROMETHAMINE 15 MG/ML IJ SOLN
15.0000 mg | Freq: Once | INTRAMUSCULAR | Status: AC
  Administered 2022-10-04: 15 mg via INTRAVENOUS
  Filled 2022-10-04: qty 1

## 2022-10-04 MED ORDER — TETANUS-DIPHTH-ACELL PERTUSSIS 5-2.5-18.5 LF-MCG/0.5 IM SUSY
0.5000 mL | PREFILLED_SYRINGE | Freq: Once | INTRAMUSCULAR | Status: AC
  Administered 2022-10-04: 0.5 mL via INTRAMUSCULAR
  Filled 2022-10-04: qty 0.5

## 2022-10-04 MED ORDER — ONDANSETRON 4 MG PO TBDP: 4.0000 mg | ORAL_TABLET | Freq: Three times a day (TID) | ORAL | 0 refills | Status: DC | PRN

## 2022-10-04 MED ORDER — ONDANSETRON HCL 4 MG/2ML IJ SOLN
4.0000 mg | Freq: Once | INTRAMUSCULAR | Status: AC
  Administered 2022-10-04: 4 mg via INTRAVENOUS
  Filled 2022-10-04 (×2): qty 2

## 2022-10-04 NOTE — ED Provider Notes (Signed)
[place] [facility] AT [facility] Provider Note  CSN: [id number] Arrival date & time: 10/04/22 0045  Chief Complaint(s) Stepped on nail / Abdominal Pain   HPI Mary [name] is a 57 y.o. female with PMH COPD, bipolar depression, previous history of substance abuse, PTSD who presents emergency room for evaluation of multiple complaints including foot pain after stepping on a nail, persistent abdominal pain, nausea vomiting and weight loss, chest pain and chest tightness.  She states that 3 weeks ago she started to develop chest tightness and mild dyspnea on exertion.  Approximately 1 week later she developed severe persistent worsening nausea that is with her throughout the entirety of the day but she seems to vomit in the mornings.  She does endorse a 30 pound weight loss over the last 6 months which she says is unintentional but denies night sweats.  Denies numbness, tingling, weakness or any neurologic complaints.  She states that she was wearing a shoe when she stepped on the nail and this treated the bottom sole.   Past Medical History Past Medical History:  Diagnosis Date   Bipolar depression (HCC)    Borderline personality disorder (HCC)    COPD (chronic obstructive pulmonary disease) (HCC)    GERD (gastroesophageal reflux disease)    Hiatal hernia    History of alcohol abuse    per pt none since 10/ 2014   History of attempted suicide    History of drug abuse in remission (HCC)    per pt in remission since 12-26-2012  polysubstance dependence (alcohol, crack, cocaine, 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   History of hepatitis C per pt first dx 2009 (approx)   consulted w/ dr comer (infectious disease) note 04-26-2016 , pt states was called and told per lab work no longer has hepatitis c    History of kidney stones    Hypercalcemia    mild and long-standing--- pt asymptomatic   Primary hyperparathyroidism [facility])    endocrinology --  dr Lucianne Muss    Pruritic erythematous rash    lower legs   PTSD (post-traumatic stress disorder)    Right ureteral stone    Schizophrenia (HCC)    Vitamin D deficiency    Patient Active Problem List   Diagnosis Date Noted   Tobacco dependence 12/06/2020   GAD (generalized anxiety disorder) 11/11/2019   Bipolar I disorder, most recent episode depressed (HCC) 11/11/2019   Post-operative state 01/10/2017   Ureteral calculus 06/28/2016   Pruritic erythematous rash 04/12/2016   Reactive airway disease that is not asthma 04/12/2016   Chronic hepatitis C without hepatic coma (HCC)    Drug abuse and dependence (HCC)    Incarcerated incisional hernia s/p lap repair 02/10/2013 02/10/2013   Polysubstance dependence (HCC) 10/16/2011   Substance induced mood disorder (HCC) 10/16/2011   GERD without esophagitis 07/04/2011   Home Medication(s) Prior to Admission medications   Medication Sig Start Date End Date Taking? Authorizing Provider  ciprofloxacin (CIPRO) 500 MG tablet Take 1 tablet (500 mg total) by mouth every 12 (twelve) hours for 5 days. 10/04/22 10/09/22 Yes Jenya Putz, MD  ondansetron (ZOFRAN-ODT) 4 MG disintegrating tablet Take 1 tablet (4 mg total) by mouth every 8 (eight) hours as needed for nausea or vomiting. 10/04/22  Yes Keri Tavella, MD  buPROPion (WELLBUTRIN XL) 300 MG 24 hr tablet Take 1 tablet (300 mg total) by mouth every morning. 09/27/22   Shanna Cisco, NP  clobetasol cream (TEMOVATE) 0.05 % Apply  topically to affected area twice daily for no longer than 2 weeks consistently, then as needed after then. Wash hands well after use. 01/21/22   Allwardt, Crist Infante, PA-C  divalproex (DEPAKOTE) 500 MG DR tablet Take 1 tablet (500 mg total) by mouth 3 (three) times daily. 09/27/22   Shanna Cisco, NP  fluconazole (DIFLUCAN) 150 MG tablet Take 1 tablet (150 mg total) by mouth once a week. 08/01/21   Particia Nearing, PA-C  nicotine (NICODERM CQ) 21 mg/24hr patch Place 1 patch (21  mg total) onto the skin daily. 09/27/22   Shanna Cisco, NP  ondansetron (ZOFRAN) 4 MG tablet Take 1 tablet (4 mg total) by mouth every 8 (eight) hours as needed for nausea or vomiting. 01/21/22   Allwardt, Alyssa M, PA-C  ondansetron (ZOFRAN-ODT) 4 MG disintegrating tablet Take 1 tablet (4 mg total) by mouth every 8 (eight) hours as needed for nausea or vomiting. 08/09/22   White, Elita Boone, NP  QUEtiapine (SEROQUEL) 50 MG tablet Take 1 tablet (50 mg total) by mouth at bedtime. 09/27/22   Shanna Cisco, NP  fluticasone-salmeterol (ADVAIR HFA) 230-21 MCG/ACT inhaler Inhale 2 puffs into the lungs 2 (two) times daily. Rinse mouth after each use Patient not taking: Reported on 02/14/2017 07/04/16 07/25/19  Anne Ng, NP                                                                                                                                    Past Surgical History Past Surgical History:  Procedure Laterality Date   CYSTOSCOPY N/A 01/10/2017   Procedure: CYSTOSCOPY;  Surgeon: Hermina Staggers, MD;  Location: [facility];  Service: Gynecology;  Laterality: N/A;   CYSTOSCOPY W/ URETERAL STENT PLACEMENT Right 07/14/2016   Procedure: CYSTOSCOPY WITH STENT REPLACEMENT;  Surgeon: Malen Gauze, MD;  Location: [facility];  Service: Urology;  Laterality: Right;   CYSTOSCOPY WITH RETROGRADE PYELOGRAM, URETEROSCOPY AND STENT PLACEMENT Right 06/28/2016   Procedure: CYSTOSCOPY WITH RETROGRADE PYELOGRAM, URETEROSCOPY AND STENT PLACEMENT;  Surgeon: Malen Gauze, MD;  Location: [facility];  Service: Urology;  Laterality: Right;   CYSTOSCOPY/RETROGRADE/URETEROSCOPY/STONE EXTRACTION WITH BASKET Right 07/14/2016   Procedure: CYSTOSCOPY/RETROGRADE/URETEROSCOPY/STONE EXTRACTION WITH BASKET;  Surgeon: Malen Gauze, MD;  Location[facility];  Service: Urology;  Laterality: Right;   DIAGNOSTIC LAPAROSCOPIC LIVER BIOPSY N/A 02/10/2013   Procedure: DIAGNOSTIC  LAPAROSCOPIC ;  Surgeon: Ardeth Sportsman, MD;  Location: [facility];  Service: General;  Laterality: N/A;  DIAGNOSTIC LAPAROSCOPY,laparoscopic ventral hernia repair with mesh lysis of adhesions   HERNIA REPAIR     HOLMIUM LASER APPLICATION Right 07/14/2016   Procedure: HOLMIUM LASER APPLICATION;  Surgeon: Malen Gauze, MD;  Location: [facility];  Service: Urology;  Laterality: Right;   SPLENECTOMY  2004   TUBAL LIGATION     VAGINAL HYSTERECTOMY N/A 01/10/2017   Procedure: HYSTERECTOMY VAGINAL W/  BILATERAL SALPINGECTOMY WITH REPAIR OF INCIDENTAL CYSTOTOMY;  Surgeon: Hermina Staggers, MD;  Location: [facility];  Service: Gynecology;  Laterality: N/A;   Family History Family History  Problem Relation Age of Onset   Alcohol abuse Mother    Alcohol abuse Father    Cancer Maternal Grandmother        colon   Diabetes Maternal Grandmother    Hypertension Maternal Grandmother    Alcohol abuse Sister    Thyroid disease Neg Hx     Social History Social History   Tobacco Use   Smoking status: Every Day    Current packs/day: 1.00    Average packs/day: 1 pack/day for 32.0 years (32.0 ttl pk-yrs)    Types: Cigarettes   Smokeless tobacco: Never  Vaping Use   Vaping status: Never Used  Substance Use Topics   Alcohol use: No    Comment: quit 12/2012   Drug use: No    Types: "Crack" cocaine, Marijuana, Cocaine    Comment: per pt quit 12-26-2012 marijuana 2 times a week   Allergies Patient has no known allergies.  Review of Systems Review of Systems  Constitutional:  Positive for unexpected weight change.  Respiratory:  Positive for shortness of breath.   Cardiovascular:  Positive for chest pain.  Gastrointestinal:  Positive for nausea and vomiting.  Musculoskeletal:  Positive for arthralgias.    Physical Exam Vital Signs  I have reviewed the triage vital signs BP 106/74   Pulse (!) 50   Temp (!) 97.5 F (36.4 C) (Oral)   Resp 18   Ht 5\' 4"  (1.626 m)   Wt 61.2 kg    LMP  (LMP Unknown)   SpO2 94%   BMI 23.17 kg/m   Physical Exam Vitals and nursing note reviewed.  Constitutional:      General: She is not in acute distress.    Appearance: She is well-developed.  HENT:     Head: Normocephalic and atraumatic.  Eyes:     Conjunctiva/sclera: Conjunctivae normal.  Cardiovascular:     Rate and Rhythm: Normal rate and regular rhythm.     Heart sounds: No murmur heard. Pulmonary:     Effort: Pulmonary effort is normal. No respiratory distress.     Breath sounds: Normal breath sounds.  Abdominal:     Palpations: Abdomen is soft.     Tenderness: There is no abdominal tenderness.  Musculoskeletal:        General: Tenderness present. No swelling.     Cervical back: Neck supple.  Skin:    General: Skin is warm and dry.     Capillary Refill: Capillary refill takes less than 2 seconds.  Neurological:     Mental Status: She is alert.  Psychiatric:        Mood and Affect: Mood normal.     ED Results and Treatments Labs (all labs ordered are listed, but only abnormal results are displayed) Labs Reviewed  CBC - Abnormal; Notable for the following components:      Result Value   WBC 11.1 (*)    All other components within normal limits  URINALYSIS, ROUTINE W REFLEX MICROSCOPIC - Abnormal; Notable for the following components:   APPearance HAZY (*)    All other components within normal limits  RESP PANEL BY RT-PCR (RSV, FLU A&B, COVID)  RVPGX2  LIPASE, BLOOD  COMPREHENSIVE METABOLIC PANEL  PHOSPHORUS  TSH  PARATHYROID HORMONE, INTACT (NO CA)  TROPONIN I (HIGH SENSITIVITY)  Radiology CT CHEST ABDOMEN PELVIS W CONTRAST  Result Date: 10/04/2022 CLINICAL DATA:  57 year old female with chest pain. Intermittent abdominal pain for 3 weeks with nausea. Unintentional weight loss. Smoker. EXAM: CT CHEST, ABDOMEN, AND PELVIS WITH  CONTRAST TECHNIQUE: Multidetector CT imaging of the chest, abdomen and pelvis was performed following the standard protocol during bolus administration of intravenous contrast. RADIATION DOSE REDUCTION: This exam was performed according to the departmental dose-optimization program which includes automated exposure control, adjustment of the mA and/or kV according to patient size and/or use of iterative reconstruction technique. CONTRAST:  75mL OMNIPAQUE IOHEXOL 350 MG/ML SOLN COMPARISON:  Chest radiographs today. CTA chest and CT pelvis 01/12/2017. CT Abdomen and Pelvis 02/10/2013. FINDINGS: CT CHEST FINDINGS Cardiovascular: No cardiomegaly or pericardial effusion. Negative thoracic aorta. Mediastinum/Nodes: Left thoracic inlet, paraesophageal oval 14 mm heterogeneously enhancing soft tissue nodule which was nonenhancing on the early contrast phase CTA in 2018. This has morphology suspicious for parathyroid adenoma (see also series 7, image 41). Otherwise negative visible thoracic inlet and mediastinum. No lymphadenopathy. Lungs/Pleura: Centrilobular emphysema. Major airways are patent. Some generalized bronchial wall thickening. But no pneumothorax, pleural effusion or consolidation. Mild bilateral middle lobe scarring. No other evidence of acute pulmonary inflammation. Musculoskeletal: No acute or suspicious osseous lesion. CT ABDOMEN PELVIS FINDINGS Hepatobiliary: Early liver contrast enhancement initially. Visible liver on the delayed images is normal. Negative gallbladder. Pancreas: Negative. 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 Adrenals/Urinary Tract: Normal adrenal glands. Nonobstructed kidneys with symmetric renal enhancement and contrast excretion. No nephrolithiasis or renal inflammation. Unremarkable bladder. Stomach/Bowel: Large bowel is decompressed from the splenic flexure to the rectum. Mild upstream retained stool. No large bowel wall thickening identified and the appendix appears normal on coronal  image 53. But there is questionable generalized large and small bowel mucosal hyperenhancement throughout the abdomen and pelvis. Fluid-filled but nondilated small bowel loops throughout. Questionable similar gastric mucosal hyperenhancement. No discrete gastroduodenal inflammation. Duodenum is decompressed. No free air, free fluid, or discrete mesenteric stranding. Vascular/Lymphatic: Normal caliber abdominal aorta. Major arterial structures are patent. Mild Aortoiliac calcified atherosclerosis. Portal venous system appears to be patent. No lymphadenopathy identified. Reproductive: Surgically absent uterus. Left ovary is within normal limits series 4, image 92. Right ovary is within normal limits on image 96. Other: No pelvis free fluid. Musculoskeletal: Grade 2 spondylolisthesis of L5 on S1, chronic but mildly progressed since 2018. Associated severe chronic disc degeneration with vacuum disc, and severe bilateral facet degeneration there. No superimposed acute or suspicious osseous lesion. IMPRESSION: 1. Left thoracic inlet 14 mm soft tissue nodule with morphology and enhancement suspicious for a Parathyroid Adenoma. Query Hyperparathyroidism. 2. Emphysema (ICD10-J43.9). Generalized bronchial wall thickening raising the possibility of bronchitis. But no other acute or inflammatory process in the chest. 3. Questionable generalized gastrointestinal mucosal hyperenhancement, but might be artifact due to early contrast timing. Consider Gastroenteritis. 4. No other acute or inflammatory process identified in the abdomen, or pelvis. Chronic splenectomy and hysterectomy. Electronically Signed   By: Odessa Fleming M.D.   On: 10/04/2022 05:36   DG Chest 2 View  Result Date: 10/04/2022 CLINICAL DATA:  57 year old female with chest pain. Intermittent abdominal pain for 3 weeks with nausea. Unintentional weight loss. Smoker. EXAM: CHEST - 2 VIEW COMPARISON:  Chest radiographs 04/06/2022 and earlier. Subsequent CT Chest,  Abdomen, and Pelvis today reported separately. FINDINGS: PA and lateral views at 0440 hours. Larger lung volumes since 2018 and CT evidence of some centrilobular emphysema today. Mediastinal contours remain within normal limits. Visualized tracheal  air column is within normal limits. Somewhat coarse pulmonary interstitial markings appear symmetric bilaterally, similar to January. No superimposed pneumothorax, pulmonary edema, pleural effusion, or confluent lung opacity. No acute osseous abnormality identified. Negative visible bowel gas. IMPRESSION: No acute cardiopulmonary abnormality. Some chronic interstitial lung changes including Emphysema (ICD10-J43.9). Electronically Signed   By: Odessa Fleming M.D.   On: 10/04/2022 05:24   DG Foot Complete Left  Result Date: 10/04/2022 CLINICAL DATA:  Stepped on nail. EXAM: LEFT FOOT - COMPLETE 3+ VIEW COMPARISON:  None Available. FINDINGS: There is no evidence of fracture or dislocation. There is no evidence of arthropathy or other focal bone abnormality. Soft tissues are unremarkable. No soft tissue gas or radiopaque foreign body. IMPRESSION: No fracture or foreign body. Electronically Signed   By: Charlett Nose M.D.   On: 10/04/2022 02:03    Pertinent labs & imaging results that were available during my care of the patient were reviewed by me and considered in my medical decision making (see MDM for details).  Medications Ordered in ED Medications  Tdap (BOOSTRIX) injection 0.5 mL (0.5 mLs Intramuscular Given 10/04/22 0455)  ketorolac (TORADOL) 15 MG/ML injection 15 mg (15 mg Intravenous Given 10/04/22 0519)  iohexol (OMNIPAQUE) 350 MG/ML injection 75 mL (75 mLs Intravenous Contrast Given 10/04/22 0517)  alum & mag hydroxide-simeth (MAALOX/MYLANTA) 200-200-20 MG/5ML suspension 30 mL (30 mLs Oral Given 10/04/22 0634)    And  lidocaine (XYLOCAINE) 2 % viscous mouth solution 15 mL (15 mLs Oral Given 10/04/22 0633)  ondansetron (ZOFRAN) injection 4 mg (4 mg Intravenous  Given 10/04/22 2130)  lactated ringers bolus 1,000 mL (0 mLs Intravenous Stopped 10/04/22 0737)  ciprofloxacin (CIPRO) tablet 500 mg (500 mg Oral Given 10/04/22 8657)                                                                                                                                     Procedures Procedures  (including critical care time)  Medical Decision Making / ED Course   This patient presents to the ED for concern of foot pain, weight loss, abdominal pain, nausea, chest pain, this involves an extensive number of treatment options, and is a complaint that carries with it a high risk of complications and morbidity.  The differential diagnosis includes fracture, retained foreign body, local puncture wound, electrolyte abnormality, GERD, malignancy, ACS, pneumonia, pneumonitis, peptic ulcer disease, hyperthyroidism,  MDM: Patient seen in the emergency room for multiple complaints described above.  Physical exam reveals tenderness at the sole of the foot on the left at the site of the puncture wound.  Tdap updated and patient started on Cipro for pseudomonal coverage.  X-ray of the foot is reassuringly negative.  Laboratory evaluation with leukocytosis to 11.1 but is otherwise unremarkable.  High-sensitivity troponin is unremarkable.  ECG is nonischemic.  Given unexplained weight loss with nausea and vomiting, CT chest abdomen pelvis was obtained that was reassuringly negative  for malignancy but does show a nodule concerning for a parathyroid adenoma and possible hyperparathyroidism, emphysema, possible gastritis.  PTH, TSH, and phosphorus are pending.  I placed him with referrals to both gastroenterology and endocrinology for unexplained persistent nausea and vomiting hyperparathyroidism respectively.  Patient was started on Zofran for symptom control and Cipro for puncture wound.  With completely benign neurologic exam today with no focal motor or sensory deficits, normal gait with no  ataxia, no cranial nerve deficits, no headaches, I had lower suspicion for intracranial pathology or mass and thus deferred CT imaging.  However, if symptoms continue to be unexplained despite GI evaluation could consider outpatient CT versus MRI imaging to rule out brain malignancy given morning time vomiting.  At this time she does not meet inpatient criteria for admission and she is safe for discharge with outpatient follow-up.   Additional history obtained: -External records from outside source obtained and reviewed including: Chart review including previous notes, labs, imaging, consultation notes   Lab Tests: -I ordered, reviewed, and interpreted labs.   The pertinent results include:   Labs Reviewed  CBC - Abnormal; Notable for the following components:      Result Value   WBC 11.1 (*)    All other components within normal limits  URINALYSIS, ROUTINE W REFLEX MICROSCOPIC - Abnormal; Notable for the following components:   APPearance HAZY (*)    All other components within normal limits  RESP PANEL BY RT-PCR (RSV, FLU A&B, COVID)  RVPGX2  LIPASE, BLOOD  COMPREHENSIVE METABOLIC PANEL  PHOSPHORUS  TSH  PARATHYROID HORMONE, INTACT (NO CA)  TROPONIN I (HIGH SENSITIVITY)      EKG   EKG Interpretation Date/Time:  Wednesday October 04 2022 04:35:32 EDT Ventricular Rate:  53 PR Interval:  189 QRS Duration:  82 QT Interval:  422 QTC Calculation: 397 R Axis:   82  Text Interpretation: Sinus rhythm Confirmed by Benton Tooker ([id number]) on 10/04/2022 9:01:32 PM         Imaging Studies ordered: I ordered imaging studies including CT chest abdomen pelvis, chest x-ray I independently visualized and interpreted imaging. I agree with the radiologist interpretation   Medicines ordered and prescription drug management: Meds ordered this encounter  Medications   Tdap (BOOSTRIX) injection 0.5 mL   ketorolac (TORADOL) 15 MG/ML injection 15 mg   iohexol (OMNIPAQUE) 350 MG/ML injection  75 mL   AND Linked Order Group    alum & mag hydroxide-simeth (MAALOX/MYLANTA) 200-200-20 MG/5ML suspension 30 mL    lidocaine (XYLOCAINE) 2 % viscous mouth solution 15 mL   ondansetron (ZOFRAN) injection 4 mg   lactated ringers bolus 1,000 mL   ciprofloxacin (CIPRO) tablet 500 mg   ondansetron (ZOFRAN-ODT) 4 MG disintegrating tablet    Sig: Take 1 tablet (4 mg total) by mouth every 8 (eight) hours as needed for nausea or vomiting.    Dispense:  20 tablet    Refill:  0   ciprofloxacin (CIPRO) 500 MG tablet    Sig: Take 1 tablet (500 mg total) by mouth every 12 (twelve) hours for 5 days.    Dispense:  10 tablet    Refill:  0    -I have reviewed the patients home medicines and have made adjustments as needed  Critical interventions none    Cardiac Monitoring: The patient was maintained on a cardiac monitor.  I personally viewed and interpreted the cardiac monitored which showed an underlying rhythm of: Sinus bradycardia  Social Determinants of Health:  Factors  impacting patients care include: Previous history of polysubstance use, clean for almost a decade   Reevaluation: After the interventions noted above, I reevaluated the patient and found that they have :improved  Co morbidities that complicate the patient evaluation  Past Medical History:  Diagnosis Date   Bipolar depression (HCC)    Borderline personality disorder (HCC)    COPD (chronic obstructive pulmonary disease) (HCC)    GERD (gastroesophageal reflux disease)    Hiatal hernia    History of alcohol abuse    per pt none since 10/ 2014   History of attempted suicide    History of drug abuse in remission (HCC)    per pt in remission since 12-26-2012  polysubstance dependence (alcohol, crack, cocaine, 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   History of hepatitis C per pt first dx 2009 (approx)   consulted w/ dr comer (infectious disease) note 04-26-2016 , pt states was called and told per lab work no longer  has hepatitis c    History of kidney stones    Hypercalcemia    mild and long-standing--- pt asymptomatic   Primary hyperparathyroidism [facility])    endocrinology --  dr Lucianne Muss   Pruritic erythematous rash    lower legs   PTSD (post-traumatic stress disorder)    Right ureteral stone    Schizophrenia (HCC)    Vitamin D deficiency       Dispostion: I considered admission for this patient, but at this time she does not meet inpatient criteria for admission and she is safe for discharge outpatient follow-up     Final Clinical Impression(s) / ED Diagnoses Final diagnoses:  Weight loss  Parathyroid adenoma  Nausea     @PCDICTATION @    Glendora Score, MD 10/04/22 2105

## 2022-10-04 NOTE — ED Triage Notes (Addendum)
Patient accidentally stepped on a nail at work at left foot this evening . She adds intermittent pain across her abdomen for 3 weeks with occasional nausea /emesis in the morning . Mild chest tightness at arrival .

## 2022-10-05 ENCOUNTER — Encounter: Payer: Self-pay | Admitting: Gastroenterology

## 2022-10-05 LAB — PARATHYROID HORMONE, INTACT (NO CA): PTH: 61 pg/mL (ref 15–65)

## 2022-10-12 ENCOUNTER — Ambulatory Visit (INDEPENDENT_AMBULATORY_CARE_PROVIDER_SITE_OTHER): Payer: BC Managed Care – PPO | Admitting: Gastroenterology

## 2022-10-12 ENCOUNTER — Encounter: Payer: Self-pay | Admitting: Gastroenterology

## 2022-10-12 VITALS — BP 120/74 | HR 62 | Ht 64.0 in | Wt 136.8 lb

## 2022-10-12 DIAGNOSIS — Z1211 Encounter for screening for malignant neoplasm of colon: Secondary | ICD-10-CM | POA: Diagnosis not present

## 2022-10-12 DIAGNOSIS — K219 Gastro-esophageal reflux disease without esophagitis: Secondary | ICD-10-CM | POA: Diagnosis not present

## 2022-10-12 DIAGNOSIS — R112 Nausea with vomiting, unspecified: Secondary | ICD-10-CM | POA: Diagnosis not present

## 2022-10-12 DIAGNOSIS — R1310 Dysphagia, unspecified: Secondary | ICD-10-CM

## 2022-10-12 DIAGNOSIS — R634 Abnormal weight loss: Secondary | ICD-10-CM

## 2022-10-12 MED ORDER — NA SULFATE-K SULFATE-MG SULF 17.5-3.13-1.6 GM/177ML PO SOLN: 1.0000 | Freq: Once | ORAL | 0 refills | Status: AC

## 2022-10-12 MED ORDER — PANTOPRAZOLE SODIUM 40 MG PO TBEC: 40.0000 mg | DELAYED_RELEASE_TABLET | Freq: Every day | ORAL | 3 refills | Status: DC

## 2022-10-12 NOTE — Patient Instructions (Addendum)
  We have sent the following medications to your pharmacy for you to pick up at your convenience: Protonix  You have been scheduled for a colonoscopy/egd. Please follow written instructions given to you at your visit today.   Please pick up your prep supplies at the pharmacy within the next 1-3 days.  If you use inhalers (even only as needed), please bring them with you on the day of your procedure.  DO NOT TAKE 7 DAYS PRIOR TO TEST- Trulicity (dulaglutide) Ozempic, Wegovy (semaglutide) Mounjaro (tirzepatide) Bydureon Bcise (exanatide extended release)  DO NOT TAKE 1 DAY PRIOR TO YOUR TEST Rybelsus (semaglutide) Adlyxin (lixisenatide) Victoza (liraglutide) Byetta (exanatide) ___________________________________________________________________________  _______________________________________________________  If your blood pressure at your visit was 140/90 or greater, please contact your primary care physician to follow up on this.  _______________________________________________________  If you are age 27 or older, your body mass index should be between 23-30. Your Body mass index is 23.48 kg/m. If this is out of the aforementioned range listed, please consider follow up with your Primary Care Provider.  If you are age 12 or younger, your body mass index should be between 19-25. Your Body mass index is 23.48 kg/m. If this is out of the aformentioned range listed, please consider follow up with your Primary Care Provider.   ________________________________________________________  The [facility] providers would like to encourage you to use [facility] to communicate with providers for non-urgent requests or questions.  Due to long hold times on the telephone, sending your provider a message by [facility] may be a faster and more efficient way to get a response.  Please allow 48 business hours for a response.  Please remember that this is for non-urgent requests.   _______________________________________________________ It was a pleasure to see you today!  Thank you for trusting me with your gastrointestinal care!

## 2022-10-12 NOTE — Progress Notes (Signed)
Chief Complaint: Nausea Primary GI MD: Gentry Fitz  HPI: 57 year old female history of COPD, GERD, bipolar depression, borderline personality disorder, history of polysubstance use in remission since 2014, history of hepatitis C (followed by infectious disease s/p treatment 2018), presents for evaluation of hospital follow-up  Seen at [facility] emergency department for nausea, vomiting, and 30 pound weight loss over the last 6 months.  No imaging was performed at that time  CMP unrevealing CBC with leukocytosis 11.1 TSH normal Lipase 34  Patient states she has a 30-year history of crack abuse and she has been in remission for the last 11 years.  States over the last few months she has been waking up with severe nausea, heartburn, and vomiting.  Denies bloody emesis.  She also reports a lack of appetite and states over the last few months she has had a 30 pound weight loss.  She is not on an antacid at this time.  She also reports dysphagia in which solid food gets stuck in her suprasternal notch with everything she eats.  States she has to use water to get it to go down.  Denies lower GI symptoms.  She states a few years ago she was dealing with sciatic nerve pain.  At this time she was taking 800 Mg ibuprofen 4-5 times per day (totaling 3200 Mg to 4000 mg ibuprofen daily for many months).  Denies family history of colon cancer.  Denies previous screening colonoscopy.   Past Medical History:  Diagnosis Date   Bipolar depression (HCC)    Borderline personality disorder (HCC)    COPD (chronic obstructive pulmonary disease) (HCC)    GERD (gastroesophageal reflux disease)    Hiatal hernia    History of alcohol abuse    per pt none since 10/ 2014   History of attempted suicide    History of drug abuse in remission (HCC)    per pt in remission since 12-26-2012  polysubstance dependence (alcohol, crack, cocaine, 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   History of hepatitis  C per pt first dx 2009 (approx)   consulted w/ dr comer (infectious disease) note 04-26-2016 , pt states was called and told per lab work no longer has hepatitis c    History of kidney stones    Hypercalcemia    mild and long-standing--- pt asymptomatic   Primary hyperparathyroidism [facility])    endocrinology --  dr Lucianne Muss   Pruritic erythematous rash    lower legs   PTSD (post-traumatic stress disorder)    Right ureteral stone    Schizophrenia (HCC)    Vitamin D deficiency     Past Surgical History:  Procedure Laterality Date   CYSTOSCOPY N/A 01/10/2017   Procedure: CYSTOSCOPY;  Surgeon: Hermina Staggers, MD;  Location: [facility];  Service: Gynecology;  Laterality: N/A;   CYSTOSCOPY W/ URETERAL STENT PLACEMENT Right 07/14/2016   Procedure: CYSTOSCOPY WITH STENT REPLACEMENT;  Surgeon: Malen Gauze, MD;  Location: [facility];  Service: Urology;  Laterality: Right;   CYSTOSCOPY WITH RETROGRADE PYELOGRAM, URETEROSCOPY AND STENT PLACEMENT Right 06/28/2016   Procedure: CYSTOSCOPY WITH RETROGRADE PYELOGRAM, URETEROSCOPY AND STENT PLACEMENT;  Surgeon: Malen Gauze, MD;  Location: [facility];  Service: Urology;  Laterality: Right;   CYSTOSCOPY/RETROGRADE/URETEROSCOPY/STONE EXTRACTION WITH BASKET Right 07/14/2016   Procedure: CYSTOSCOPY/RETROGRADE/URETEROSCOPY/STONE EXTRACTION WITH BASKET;  Surgeon: Malen Gauze, MD;  Location: [facility];  Service: Urology;  Laterality: Right;   DIAGNOSTIC LAPAROSCOPIC LIVER BIOPSY  N/A 02/10/2013   Procedure: DIAGNOSTIC LAPAROSCOPIC ;  Surgeon: Ardeth Sportsman, MD;  Location: [facility];  Service: General;  Laterality: N/A;  DIAGNOSTIC LAPAROSCOPY,laparoscopic ventral hernia repair with mesh lysis of adhesions   HERNIA REPAIR     HOLMIUM LASER APPLICATION Right 07/14/2016   Procedure: HOLMIUM LASER APPLICATION;  Surgeon: Malen Gauze, MD;  Location: [facility];  Service: Urology;  Laterality: Right;    SPLENECTOMY  2004   TUBAL LIGATION     VAGINAL HYSTERECTOMY N/A 01/10/2017   Procedure: HYSTERECTOMY VAGINAL W/ BILATERAL SALPINGECTOMY WITH REPAIR OF INCIDENTAL CYSTOTOMY;  Surgeon: Hermina Staggers, MD;  Location: [facility];  Service: Gynecology;  Laterality: N/A;    Current Outpatient Medications  Medication Sig Dispense Refill   buPROPion (WELLBUTRIN XL) 300 MG 24 hr tablet Take 1 tablet (300 mg total) by mouth every morning. 30 tablet 3   clobetasol cream (TEMOVATE) 0.05 % Apply topically to affected area twice daily for no longer than 2 weeks consistently, then as needed after then. Wash hands well after use. 45 g 0   divalproex (DEPAKOTE) 500 MG DR tablet Take 1 tablet (500 mg total) by mouth 3 (three) times daily. 90 tablet 3   fluconazole (DIFLUCAN) 150 MG tablet Take 1 tablet (150 mg total) by mouth once a week. 2 tablet 0   nicotine (NICODERM CQ) 21 mg/24hr patch Place 1 patch (21 mg total) onto the skin daily. 28 patch 4   ondansetron (ZOFRAN) 4 MG tablet Take 1 tablet (4 mg total) by mouth every 8 (eight) hours as needed for nausea or vomiting. 10 tablet 0   ondansetron (ZOFRAN-ODT) 4 MG disintegrating tablet Take 1 tablet (4 mg total) by mouth every 8 (eight) hours as needed for nausea or vomiting. 20 tablet 0   ondansetron (ZOFRAN-ODT) 4 MG disintegrating tablet Take 1 tablet (4 mg total) by mouth every 8 (eight) hours as needed for nausea or vomiting. 20 tablet 0   QUEtiapine (SEROQUEL) 50 MG tablet Take 1 tablet (50 mg total) by mouth at bedtime. 90 tablet 1   No current facility-administered medications for this visit.    Allergies as of 10/12/2022   (No Known Allergies)    Family History  Problem Relation Age of Onset   Alcohol abuse Mother    Alcohol abuse Father    Cancer Maternal Grandmother        colon   Diabetes Maternal Grandmother    Hypertension Maternal Grandmother    Alcohol abuse Sister    Thyroid disease Neg Hx     Social History   Socioeconomic  History   Marital status: Single    Spouse name: Not on file   Number of children: Not on file   Years of education: Not on file   Highest education level: Not on file  Occupational History   Not on file  Tobacco Use   Smoking status: Every Day    Current packs/day: 1.00    Average packs/day: 1 pack/day for 32.0 years (32.0 ttl pk-yrs)    Types: Cigarettes   Smokeless tobacco: Never  Vaping Use   Vaping status: Never Used  Substance and Sexual Activity   Alcohol use: No    Comment: quit 12/2012   Drug use: No    Types: "Crack" cocaine, Marijuana, Cocaine    Comment: per pt quit 12-26-2012 marijuana 2 times a week   Sexual activity: Yes    Birth control/protection: None  Other Topics Concern  Not on file  Social History Narrative   Not on file   Social Determinants of Health   Financial Resource Strain: Not on file  Food Insecurity: Not on file  Transportation Needs: Not on file  Physical Activity: Not on file  Stress: Not on file  Social Connections: Not on file  Intimate Partner Violence: Not on file    Review of Systems:    Constitutional: No weight loss, fever, chills, weakness or fatigue HEENT: Eyes: No change in vision               Ears, Nose, Throat:  No change in hearing or congestion Skin: No rash or itching Cardiovascular: No chest pain, chest pressure or palpitations   Respiratory: No SOB or cough Gastrointestinal: See HPI and otherwise negative Genitourinary: No dysuria or change in urinary frequency Neurological: No headache, dizziness or syncope Musculoskeletal: No new muscle or joint pain Hematologic: No bleeding or bruising Psychiatric: No history of depression or anxiety    Physical Exam:  Vital signs: LMP  (LMP Unknown)   Constitutional: NAD, Well developed, Well nourished, alert and cooperative Head:  Normocephalic and atraumatic. Eyes:   PEERL, EOMI. No icterus. Conjunctiva pink. Respiratory: Respirations even and unlabored. Lungs  clear to auscultation bilaterally.   No wheezes, crackles, or rhonchi.  Cardiovascular:  Regular rate and rhythm. No peripheral edema, cyanosis or pallor.  Gastrointestinal:  Soft, nondistended, nontender. No rebound or guarding. Normal bowel sounds. No appreciable masses or hepatomegaly. Rectal:  Not performed.  Msk:  Symmetrical without gross deformities. Without edema, no deformity or joint abnormality.  Neurologic:  Alert and  oriented x4;  grossly normal neurologically.  Skin:   Dry and intact without significant lesions or rashes. Psychiatric: Oriented to person, place and time. Demonstrates good judgement and reason without abnormal affect or behaviors.   RELEVANT LABS AND IMAGING: CBC    Component Value Date/Time   WBC 11.1 (H) 10/04/2022 0104   RBC 4.11 10/04/2022 0104   HGB 12.6 10/04/2022 0104   HCT 37.5 10/04/2022 0104   PLT 277 10/04/2022 0104   MCV 91.2 10/04/2022 0104   MCH 30.7 10/04/2022 0104   MCHC 33.6 10/04/2022 0104   RDW 13.1 10/04/2022 0104   LYMPHSABS 3.5 07/09/2021 0120   MONOABS 0.9 07/09/2021 0120   EOSABS 0.2 07/09/2021 0120   BASOSABS 0.1 07/09/2021 0120    CMP     Component Value Date/Time   NA 138 10/04/2022 0104   K 4.0 10/04/2022 0104   CL 105 10/04/2022 0104   CO2 26 10/04/2022 0104   GLUCOSE 97 10/04/2022 0104   BUN 9 10/04/2022 0104   CREATININE 0.85 10/04/2022 0104   CALCIUM 10.3 10/04/2022 0104   PROT 6.8 10/04/2022 0104   ALBUMIN 3.9 10/04/2022 0104   AST 20 10/04/2022 0104   ALT 16 10/04/2022 0104   ALT 13 04/26/2016 1045   ALKPHOS 84 10/04/2022 0104   BILITOT 0.9 10/04/2022 0104   GFRNONAA >60 10/04/2022 0104   GFRAA >60 01/11/2017 2359     Assessment/Plan:   Dysphagia, unspecified type Dysphagia to solids.  DDx includes stricture, mass, other structural abnormality as well as globus sensation --- EGD with possible dilation for further evaluation --- Pantoprazole 40 Mg once daily --- I thoroughly discussed the  procedure with the patient (at bedside) to include nature of the procedure, alternatives, benefits, and risks (including but not limited to bleeding, infection, perforation, anesthesia/cardiac pulmonary complications).  Patient verbalized understanding and gave verbal consent  to proceed with procedure.    Gastroesophageal reflux disease, unspecified whether esophagitis present Nausea and vomiting, unspecified vomiting type Suspect patient's early morning nausea and vomiting could be due to GERD occurring overnight.  Her nausea and vomiting does get better throughout the day.  She is not currently on an antacid.  Extensive NSAID use history taking 3200-4000 mg ibuprofen daily in the past. --- EGD to rule out esophagitis, gastritis, PUD --- Pantoprazole 40 Mg once daily at bedtime to help with morning symptoms --- Educated patient on GERD lifestyle modifications --- Please avoid NSAIDs  Screening for colon cancer Due for initial screening colonoscopy --- Schedule colonoscopy --- I thoroughly discussed the procedure with the patient (at bedside) to include nature of the procedure, alternatives, benefits, and risks (including but not limited to bleeding, infection, perforation, anesthesia/cardiac pulmonary complications).  Patient verbalized understanding and gave verbal consent to proceed with procedure.   Weight loss Discussion with patient about next steps to evaluate for weight loss including EGD/colonoscopy as well as CT abdomen pelvis with contrast.  Patient states she does not want to spend a significant amount of money.  Joint decision making led to undergoing EGD/colonoscopy and if negative then we will pursue CT abdomen pelvis.  Weight loss likely secondary to her loss of appetite with persistent nausea and vomiting.  However, cannot rule out malignancy --- If EGD/colonoscopy are negative we will proceed with CT abdomen pelvis with contrast  Boone Master, PA-C   Gastroenterology 10/12/2022, 8:30 AM  Cc: No ref. provider found

## 2022-11-08 ENCOUNTER — Other Ambulatory Visit: Payer: Self-pay

## 2022-11-08 ENCOUNTER — Ambulatory Visit: Payer: BC Managed Care – PPO | Admitting: Student

## 2022-11-08 ENCOUNTER — Encounter: Payer: Self-pay | Admitting: Student

## 2022-11-08 VITALS — BP 119/81 | HR 55 | Temp 98.2°F | Ht 65.0 in | Wt 133.6 lb

## 2022-11-08 DIAGNOSIS — Z13220 Encounter for screening for lipoid disorders: Secondary | ICD-10-CM | POA: Insufficient documentation

## 2022-11-08 DIAGNOSIS — E215 Disorder of parathyroid gland, unspecified: Secondary | ICD-10-CM | POA: Diagnosis not present

## 2022-11-08 DIAGNOSIS — R112 Nausea with vomiting, unspecified: Secondary | ICD-10-CM

## 2022-11-08 DIAGNOSIS — Z131 Encounter for screening for diabetes mellitus: Secondary | ICD-10-CM | POA: Diagnosis not present

## 2022-11-08 DIAGNOSIS — Z1231 Encounter for screening mammogram for malignant neoplasm of breast: Secondary | ICD-10-CM | POA: Insufficient documentation

## 2022-11-08 MED ORDER — ONDANSETRON HCL 4 MG PO TABS: 4.0000 mg | ORAL_TABLET | Freq: Three times a day (TID) | ORAL | 0 refills | Status: DC | PRN

## 2022-11-08 NOTE — Patient Instructions (Addendum)
Mary [name], Mary [name] you for allowing me to take part in your care today.  Here are your instructions.  1. Regarding your nausea and vomiting, this could be many things that could cause this. One of them could be related to the [facility] use. If possible, try to see if you can stop using THC and see if this will help. This could also be related to a structural cause which could cause this. Please follow up with your stomach doctor to make sure that you get the colonoscopy and your endoscopy.   2. I am screening you for diabetes and high cholesterol today. I will call you with the results.   3.I am ordering for a thyroid ultrasound to look at the nodule closely to see what it could be. Please await phone call to schedule this.  4. Please call your psychiatrist to make sure that you have refills for your psychiatric medications.   5. Follow up in about 5 weeks and we can discuss further about your colonoscopy results and your endoscopy results.   Thank you, Dr. Allena Katz  If you have any other questions please contact the internal medicine clinic at [phone]

## 2022-11-08 NOTE — Assessment & Plan Note (Signed)
Patient evaluated bedside for nausea and vomiting today.

## 2022-11-08 NOTE — Progress Notes (Signed)
CC: Nausea and vomiting  HPI:  [name] is a 57 y.o. female with past medical history of GERD, chronic hepatitis C, reactive airway disease, bipolar disorder, anxiety presenting to establish care.  Please see assessment and plan for full HPI.  Meds: Bipolar disorder: Wellbutrin 300 mg nightly, quetiapine 50 milligrams nightly, Depakote 500 mg daily GERD: Protonix 40 mg: Nausea/vomiting: Zofran 4 mg every 8 hours as needed  Recently seen by gastroenterology for concerns of weight loss, nausea, vomiting.  Also seen in the emergency department for the same. Dysphagia, plan was for EGD with possible dilatation.  Pantoprazole 40 mg daily GERD: Continue taking, pantoprazole Colon cancer screening: Colonoscopy Dysphagia, nausea, vomiting: Planning for EGD/colonoscopy as well as CT abdomen pelvis with contrast.  Follow-up with with contrast.  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, alone,  -Support: Does not have a good support system  Care gaps: Pap smear: has been awhile since she has been had pap  Mammo: Referral    Past Medical History:  Diagnosis Date   Bipolar depression (HCC)    Borderline personality disorder (HCC)    COPD (chronic obstructive pulmonary disease) (HCC)    GERD (gastroesophageal reflux disease)    Hiatal hernia    History of alcohol abuse    per pt none since 10/ 2014   History of attempted suicide    History of drug abuse in remission (HCC)    per pt in remission since 12-26-2012  polysubstance dependence (alcohol, crack, cocaine,  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   History of hepatitis C per pt first dx 2009 (approx)   consulted w/ dr comer (infectious disease) note 04-26-2016 , pt states was called and told per lab work no longer has hepatitis c    History of kidney stones    Hypercalcemia    mild and long-standing--- pt asymptomatic   Nausea and vomiting 11/08/2022   Primary hyperparathyroidism [facility])    endocrinology --  dr Lucianne Muss   Pruritic erythematous rash    lower legs   PTSD (post-traumatic stress disorder)    Right ureteral stone    Schizophrenia (HCC)    Vitamin D deficiency      Current Outpatient Medications:    buPROPion (WELLBUTRIN XL) 300 MG 24 hr tablet, Take 1 tablet (300 mg total) by mouth every morning., Disp: 30 tablet, Rfl: 3   clobetasol cream (TEMOVATE) 0.05 %, Apply topically to affected area twice daily for no longer than 2 weeks consistently, then as needed after then. Wash hands well after use. (Patient not taking: Reported on 10/12/2022), Disp: 45 g, Rfl: 0   divalproex (DEPAKOTE) 500 MG DR tablet, Take 1 tablet (500 mg total) by mouth 3 (three) times daily., Disp: 90 tablet, Rfl: 3   ondansetron (ZOFRAN) 4 MG tablet, Take 1 tablet (4 mg total) by mouth every 8 (eight) hours as needed for nausea or vomiting., Disp: 10 tablet, Rfl: 0   pantoprazole (PROTONIX) 40 MG tablet, Take 1 tablet (40 mg total) by mouth at bedtime., Disp: 30 tablet, Rfl: 3   QUEtiapine (SEROQUEL) 50 MG tablet, Take 1 tablet (50  mg total) by mouth at bedtime., Disp: 90 tablet, Rfl: 1  Review of Systems:    GI: Patient endorses nausea vomiting Physical Exam:  Vitals:   11/08/22 1032  BP: 119/81  Pulse: (!) 55  Temp: 98.2 F (36.8 C)  TempSrc: Oral  SpO2: 100%  Weight: 133 lb 9.6 oz (60.6 kg)  Height: 5\' 5"  (1.651 m)   General: Patient is sitting comfortably in the room  Head: Normocephalic, atraumatic  Cardio: Regular rate and rhythm, no murmurs, rubs or gallops Pulmonary: Clear to ausculation  bilaterally with no rales, rhonchi, and crackles  Abdomen: Soft, nontender with normoactive bowel sounds with no rebound or guarding  No masses observed   Assessment & Plan:   Nausea and vomiting Patient reports chronic history of nausea and vomiting.  This has been going on for the past 3 months.  She reports that it has gradually became worse.  She states that she can only eat breakfast.  She feels that food stays in her stomach for a long time, and then eventually she either throws it up, or she has to make herself throw up.  She states that over the past few months she has lost weight as well.  Patient did have recent imaging that did show evidence of some gastritis.  No obvious obstructions.  On exam, patient has soft, nontender, and nondistended abdomen.  She has been seen by gastroenterology, who was planning to scope her.  This could be related to some structural cause.  Other causes could also include THC use.  She does decline any improvement with hot showers.  This could also be related to gastroparesis.  Will defer gastric emptying study until after EGD and colonoscopy is performed.  Plan: -Plan for EGD and colonoscopy 12/08/1022 -Refill Zofran to bridge her until she gets to appointment -Will follow-up in 5 weeks to discuss results of colonoscopy   Parathyroid abnormality [facility]) Incidentally patient has had a parathyroid lesion on CT last month in July 2024.  PTH was within normal limits.  This likely is not functional.  Unclear what this truly is.  Unsure if benign or malignant.  Will continue further workup and thyroid ultrasound. Calcium levels were within normal limits.   Plan: -Follow-up thyroid ultrasound  Screening for hypercholesterolemia Patient is greater than 58.  Has never had a lipid panel.  Will order lipid panel today.  Plan: -Follow-up lipid panel  Screening for diabetes mellitus Patient evaluated bedside for nausea and vomiting today.    Patient discussed  with Dr. Letta Kocher, DO PGY-2 Internal Medicine Resident  Pager: [phone]

## 2022-11-08 NOTE — Assessment & Plan Note (Addendum)
Patient reports chronic history of nausea and vomiting.  This has been going on for the past 3 months.  She reports that it has gradually became worse.  She states that she can only eat breakfast.  She feels that food stays in her stomach for a long time, and then eventually she either throws it up, or she has to make herself throw up.  She states that over the past few months she has lost weight as well.  Patient did have recent imaging that did show evidence of some gastritis.  No obvious obstructions.  On exam, patient has soft, nontender, and nondistended abdomen.  She has been seen by gastroenterology, who was planning to scope her.  This could be related to some structural cause.  Other causes could also include THC use.  She does decline any improvement with hot showers.  This could also be related to gastroparesis.  Will defer gastric emptying study until after EGD and colonoscopy is performed.  Plan: -Plan for EGD and colonoscopy 12/08/1022 -Refill Zofran to bridge her until she gets to appointment -Will follow-up in 5 weeks to discuss results of colonoscopy

## 2022-11-08 NOTE — Assessment & Plan Note (Signed)
Incidentally patient has had a parathyroid lesion on CT last month in July 2024.  PTH was within normal limits.  This likely is not functional.  Unclear what this truly is.  Unsure if benign or malignant.  Will continue further workup and thyroid ultrasound. Calcium levels were within normal limits.   Plan: -Follow-up thyroid ultrasound

## 2022-11-08 NOTE — Assessment & Plan Note (Addendum)
Patient is greater than 40.  Has never had a lipid panel.  Will order lipid panel today.  Plan: -Follow-up lipid panel  Addendum:  Elevated ldl. The 10-year ASCVD risk score (Arnett DK, et al., 2019) is: 5.5%   Values used to calculate the score:     Age: 57 years     Sex: Female     Is Non-Hispanic African American: Yes     Diabetic: No     Tobacco smoker: Yes     Systolic Blood Pressure: 119 mmHg     Is BP treated: No     HDL Cholesterol: 73 mg/dL     Total Cholesterol: 201 mg/dL Will start Statin Crestor 10 mg daily.

## 2022-11-09 ENCOUNTER — Other Ambulatory Visit: Payer: Self-pay

## 2022-11-09 NOTE — Progress Notes (Signed)
 Internal Medicine Clinic Attending  Case discussed with the resident physician at the time of the visit.  We reviewed the patient's history, exam, and pertinent patient test results.  I agree with the assessment, diagnosis, and plan of care documented in the resident's note.

## 2022-11-10 ENCOUNTER — Other Ambulatory Visit: Payer: Self-pay

## 2022-11-10 LAB — HEMOGLOBIN A1C
Est. average glucose Bld gHb Est-mCnc: 111 mg/dL
Hgb A1c MFr Bld: 5.5 % (ref 4.8–5.6)

## 2022-11-10 LAB — LIPID PANEL
Chol/HDL Ratio: 2.8 ratio (ref 0.0–4.4)
Cholesterol, Total: 201 mg/dL — ABNORMAL HIGH (ref 100–199)
HDL: 73 mg/dL (ref >39)
LDL Chol Calc (NIH): 118 mg/dL — ABNORMAL HIGH (ref 0–99)
Triglycerides: 57 mg/dL (ref 0–149)
VLDL Cholesterol Cal: 10 mg/dL (ref 5–40)

## 2022-11-10 MED ORDER — ROSUVASTATIN CALCIUM 10 MG PO TABS: 10.0000 mg | ORAL_TABLET | Freq: Every day | ORAL | 1 refills | Status: DC

## 2022-11-10 NOTE — Addendum Note (Signed)
Addended by: Modena Slater on: 11/10/2022 11:01 AM   Modules accepted: Orders

## 2022-11-14 ENCOUNTER — Ambulatory Visit
Admission: RE | Admit: 2022-11-14 | Discharge: 2022-11-14 | Disposition: A | Discharge status: Home / Self Care | Payer: BC Managed Care – PPO | Source: Ambulatory Visit | Attending: Internal Medicine | Admitting: Internal Medicine

## 2022-11-14 DIAGNOSIS — E215 Disorder of parathyroid gland, unspecified: Secondary | ICD-10-CM

## 2022-11-14 DIAGNOSIS — E041 Nontoxic single thyroid nodule: Secondary | ICD-10-CM | POA: Diagnosis not present

## 2022-11-22 ENCOUNTER — Other Ambulatory Visit: Payer: Self-pay | Admitting: Student

## 2022-11-24 ENCOUNTER — Ambulatory Visit (INDEPENDENT_AMBULATORY_CARE_PROVIDER_SITE_OTHER): Payer: Self-pay | Admitting: Endocrinology

## 2022-11-24 ENCOUNTER — Encounter: Payer: Self-pay | Admitting: Endocrinology

## 2022-11-24 DIAGNOSIS — E559 Vitamin D deficiency, unspecified: Secondary | ICD-10-CM

## 2022-11-24 DIAGNOSIS — E210 Primary hyperparathyroidism: Secondary | ICD-10-CM

## 2022-11-24 DIAGNOSIS — E213 Hyperparathyroidism, unspecified: Secondary | ICD-10-CM

## 2022-11-24 DIAGNOSIS — M8589 Other specified disorders of bone density and structure, multiple sites: Secondary | ICD-10-CM

## 2022-11-24 LAB — RENAL FUNCTION PANEL
Albumin: 4.2 g/dL (ref 3.5–5.2)
BUN: 10 mg/dL (ref 6–23)
CO2: 31 meq/L (ref 19–32)
Calcium: 11.1 mg/dL — ABNORMAL HIGH (ref 8.4–10.5)
Chloride: 104 meq/L (ref 96–112)
Creatinine, Ser: 0.74 mg/dL (ref 0.40–1.20)
GFR: 89.27 mL/min (ref >60.00)
Glucose, Bld: 92 mg/dL (ref 70–99)
Phosphorus: 3.4 mg/dL (ref 2.3–4.6)
Potassium: 4.3 meq/L (ref 3.5–5.1)
Sodium: 138 meq/L (ref 135–145)

## 2022-11-24 LAB — VITAMIN D 25 HYDROXY (VIT D DEFICIENCY, FRACTURES): VITD: 18.41 ng/mL — ABNORMAL LOW (ref 30.00–100.00)

## 2022-11-24 LAB — MAGNESIUM: Magnesium: 1.8 mg/dL (ref 1.5–2.5)

## 2022-11-24 NOTE — Progress Notes (Signed)
Outpatient Endocrinology Note Mary Lilyan Prete, MD  11/24/22  [name]'s Name: Mary [name]    DOB: 15-Jan-1966    MRN: [id number]  REASON OF VISIT: New consult for evaluation of hypercalcemia/ primary hyperparathyroidism /possible parathyroid adenoma  REFERRING PROVIDER: Modena Slater, DO  PCP: Modena Slater, DO  HISTORY OF PRESENT ILLNESS:   Mary [name] is a 57 y.o. old female with past medical history listed below, is here for evaluation of hypercalcemia and primary hyperparathyroidism /possible parathyroid adenoma.    Pertinent Calcium History: - [name] had CT chest, abdomen and pelvis with contrast on October 04, 2022 showed left thoracic inlet paraesophageal overall 14 mm heterogeneously enhancing soft tissue nodule which was nonenhancing on Mary early contrast.  CTA in 2018, suspicious for parathyroid adenoma.  Due to concern of primary hyperparathyroidism [name] is referred to endocrinology for further evaluation and management. -In 2018 [name] had elevated PTH of 122 with upper normal limit of 65 however at that time she had vitamin D deficiency with vitamin D level 16.  She had normal serum calcium at that time.  [name] had intermittent mild hypercalcemia at least from 2014 in Mary range of 10.4-11.0 and other times with normal serum calcium.  Repeat PTH in July 2024 was normal 61 with serum calcium of 10.3.   -No history of kidney stone.  She has not been on medication that increase serum calcium including hydrochlorothiazide and lithium.  No history of fragility fracture.  Diagnosis of osteopenia, she had DEXA scan in March 2018 consistent with osteopenia with lumbar spine T-score of -1.7, T-score -1.8 in distal forearm and T-score -1.7 in left and right femoral neck. No family history of kidney stone, hyperparathyroidism or endocrine neoplasm. -[name] drinks milk almost daily.  She used to use infrequently.  She has not been taking any multivitamin, calcium or vitamin D  supplement. -[name] was evaluated for primary hyperparathyroidism/hypercalcemia in 2018 by Dr. Lucianne Muss in this endocrinology clinic and plan for monitoring.  - CT Scan in 10/04/2022 - Mediastinum/Nodes: Left thoracic inlet, paraesophageal oval 14 mm heterogeneously enhancing soft tissue nodule which was nonenhancing on Mary early contrast phase CTA in 2018. This has morphology suspicious for parathyroid adenoma  # She had ultrasound thyroid in November 15, 2022 no discrete thyroid nodules however showed uniformly hypoechoic soft tissue inferior and deep to Mary lower left thyroid measuring 1.6 x 1.2 x 1.0 cm, could represent parathyroid adenoma/gland.  Labs:   Latest Reference Range & Units 04/12/16 12:37 10/04/22 06:36  VITD 30.00 - 100.00 ng/mL 16.06 (L)   PTH, Intact 15 - 65 pg/mL 122 (H) 61  (L): Data is abnormally low (H): Data is abnormally high    Latest Reference Range & Units 01/11/17 23:59 07/17/20 02:22 07/09/21 01:20 04/06/22 15:39 10/04/22 01:04  Calcium 8.9 - 10.3 mg/dL 08.6 57.8 (H) 46.9 62.9 (H) 10.3  Magnesium 1.7 - 2.4 mg/dL 1.7      Albumin 3.5 - 5.0 g/dL  4.2 3.8  3.9  (H): Data is abnormally high  Interval history 11/24/22 Mary [name] denies nocturia. There is no clear history of polyuria or polydipsia. Denies constipation, recurrent nausea, or vomiting. Mary [name] reports no fatigue, memory problems and mood fluctuations.  She occasionally feels difficulty swallowing especially solid food on Mary left side.  REVIEW OF SYSTEMS:  As per history of present illness.   PAST MEDICAL HISTORY: Past Medical History:  Diagnosis Date   Bipolar depression (HCC)    Borderline personality disorder (HCC)    COPD (  chronic obstructive pulmonary disease) (HCC)    GERD (gastroesophageal reflux disease)    Hiatal hernia    History of alcohol abuse    per pt none since 10/ 2014   History of attempted suicide    History of drug abuse in remission (HCC)    per pt in remission since  12-26-2012  polysubstance dependence (alcohol, crack, cocaine, 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   History of hepatitis C per pt first dx 2009 (approx)   consulted w/ dr comer (infectious disease) note 04-26-2016 , pt states was called and told per lab work no longer has hepatitis c    History of kidney stones    Hypercalcemia    mild and long-standing--- pt asymptomatic   Nausea and vomiting 11/08/2022   Primary hyperparathyroidism [facility])    endocrinology --  dr Lucianne Muss   Pruritic erythematous rash    lower legs   PTSD (post-traumatic stress disorder)    Right ureteral stone    Schizophrenia (HCC)    Vitamin D deficiency     PAST SURGICAL HISTORY: Past Surgical History:  Procedure Laterality Date   CYSTOSCOPY N/A 01/10/2017   Procedure: CYSTOSCOPY;  Surgeon: Hermina Staggers, MD;  Location: [facility];  Service: Gynecology;  Laterality: N/A;   CYSTOSCOPY W/ URETERAL STENT PLACEMENT Right 07/14/2016   Procedure: CYSTOSCOPY WITH STENT REPLACEMENT;  Surgeon: Malen Gauze, MD;  Location[facility];  Service: Urology;  Laterality: Right;   CYSTOSCOPY WITH RETROGRADE PYELOGRAM, URETEROSCOPY AND STENT PLACEMENT Right 06/28/2016   Procedure: CYSTOSCOPY WITH RETROGRADE PYELOGRAM, URETEROSCOPY AND STENT PLACEMENT;  Surgeon: Malen Gauze, MD;  Location: [facility];  Service: Urology;  Laterality: Right;   CYSTOSCOPY/RETROGRADE/URETEROSCOPY/STONE EXTRACTION WITH BASKET Right 07/14/2016   Procedure: CYSTOSCOPY/RETROGRADE/URETEROSCOPY/STONE EXTRACTION WITH BASKET;  Surgeon: Malen Gauze, MD;  Location[facility];  Service: Urology;  Laterality: Right;   DIAGNOSTIC LAPAROSCOPIC LIVER BIOPSY N/A 02/10/2013   Procedure: DIAGNOSTIC LAPAROSCOPIC ;  Surgeon: Ardeth Sportsman, MD;  Location: [facility];  Service: General;  Laterality: N/A;  DIAGNOSTIC LAPAROSCOPY,laparoscopic ventral hernia repair with mesh lysis of adhesions   HERNIA REPAIR     HOLMIUM LASER  APPLICATION Right 07/14/2016   Procedure: HOLMIUM LASER APPLICATION;  Surgeon: Malen Gauze, MD;  Location: [facility];  Service: Urology;  Laterality: Right;   SPLENECTOMY  2004   TUBAL LIGATION     VAGINAL HYSTERECTOMY N/A 01/10/2017   Procedure: HYSTERECTOMY VAGINAL W/ BILATERAL SALPINGECTOMY WITH REPAIR OF INCIDENTAL CYSTOTOMY;  Surgeon: Hermina Staggers, MD;  Location: [facility];  Service: Gynecology;  Laterality: N/A;    ALLERGIES: No Known Allergies  FAMILY HISTORY:  Family History  Problem Relation Age of Onset   Alcohol abuse Mother    Alcohol abuse Father    Alcohol abuse Sister    Colon cancer Maternal Grandmother        colon   Diabetes Maternal Grandmother    Hypertension Maternal Grandmother    Thyroid disease Neg Hx    Stomach cancer Neg Hx    Esophageal cancer Neg Hx    No known family history of hypercalcemia, kidney stone, hyperparathyroidism, or endocrine neoplasms.   SOCIAL HISTORY: Social History   Socioeconomic History   Marital status: Single    Spouse name: Not on file   Number of children: 5   Years of education: Not on file   Highest education level: Not on file  Occupational History   Not  on file  Tobacco Use   Smoking status: Every Day    Current packs/day: 1.00    Average packs/day: 1 pack/day for 32.0 years (32.0 ttl pk-yrs)    Types: Cigarettes   Smokeless tobacco: Never  Vaping Use   Vaping status: Never Used  Substance and Sexual Activity   Alcohol use: No    Comment: quit 12/2012   Drug use: No    Types: "Crack" cocaine, Marijuana, Cocaine    Comment: per pt quit 12-26-2012 marijuana 2 times a week   Sexual activity: Yes    Birth control/protection: None  Other Topics Concern   Not on file  Social History Narrative   Not on file   Social Determinants of Health   Financial Resource Strain: Not on file  Food Insecurity: Not on file  Transportation Needs: Not on file  Physical Activity: Not on file   Stress: Not on file  Social Connections: Not on file    MEDICATIONS:  Current Outpatient Medications  Medication Sig Dispense Refill   buPROPion (WELLBUTRIN XL) 300 MG 24 hr tablet Take 1 tablet (300 mg total) by mouth every morning. 30 tablet 3   divalproex (DEPAKOTE) 500 MG DR tablet Take 1 tablet (500 mg total) by mouth 3 (three) times daily. 90 tablet 3   ondansetron (ZOFRAN) 4 MG tablet TAKE 1 TABLET BY MOUTH EVERY 8 HOURS AS NEEDED FOR NAUSEA FOR VOMITING 10 tablet 0   pantoprazole (PROTONIX) 40 MG tablet Take 1 tablet (40 mg total) by mouth at bedtime. 30 tablet 3   QUEtiapine (SEROQUEL) 50 MG tablet Take 1 tablet (50 mg total) by mouth at bedtime. 90 tablet 1   rosuvastatin (CRESTOR) 10 MG tablet Take 1 tablet (10 mg total) by mouth daily. 90 tablet 1   clobetasol cream (TEMOVATE) 0.05 % Apply topically to affected area twice daily for no longer than 2 weeks consistently, then as needed after then. Wash hands well after use. ([name] not taking: Reported on 10/12/2022) 45 g 0   No current facility-administered medications for this visit.    PHYSICAL EXAM: Vitals:   11/24/22 0902  BP: 100/60  Weight: 131 lb 12.8 oz (59.8 kg)  Height: 5\' 5"  (1.651 m)   Body mass index is 21.93 kg/m.   General: Well developed, well nourished female in no apparent distress.  HEENT: AT/[place], no external lesions. Hearing intact to Mary spoken word Eyes: EOMI. Conjunctiva clear and no icterus. Neck: Trachea midline, neck supple without appreciable thyromegaly or lymphadenopathy and no palpable thyroid nodules Lungs: Clear to auscultation, no wheeze. Respirations not labored Heart: S1S2, Regular in rate and rhythm.  Abdomen: Soft, non tender, non distended Neurologic: Alert, oriented, normal speech, deep tendon biceps reflexes normal,  no gross focal neurological deficit Extremities: No pedal pitting edema, no tremors of outstretched hands Skin: Warm, color good.  Psychiatric: Does not appear  depressed or anxious  PERTINENT HISTORIC LABORATORY AND IMAGING STUDIES:  All pertinent laboratory results were reviewed. Please see HPI also for further details.   ASSESSMENT / PLAN  1. Hypercalcemia   2. Vitamin D deficiency   3. Hyperparathyroidism (HCC)   4. Osteopenia of multiple sites   5. Primary hyperparathyroidism (HCC)    -[name] has intermittent hypercalcemia at least since 2014, calcium in Mary range of 10.4-11.0, although time mostly normal serum calcium.  She had elevated PTH of 122 with upper normal limit of 65 in 2018 however at that time she had vitamin D deficiency with vitamin  D level of 16.  PTH was normal 61 in July 2024. -She had ultrasound neck in September 2024 and CT scan in July 2024 and 2018 : Showing hypoechoic soft tissue mass inferior and deep to Mary left thyroid lobe measuring 1.6 cm. -She has history of osteopenia.  No history of kidney stone.  No fragility fracture.  She has a stable and acceptable renal function. -Overall she is concerning for primary hyperparathyroidism with parathyroid adenoma. -I would like to do further workup with following test.  Plan: -Check PTH, renal function panel, vitamin D, and magnesium -I would like to check nuclear sestamibi parathyroid scan -Check DEXA scan. - If any of these tests are concerning, we will discuss with [name] regarding parathyroidectomy, which is Mary permanent treatment.    Diagnoses and all orders for this visit:  Hypercalcemia -     Renal function panel; Future -     Parathyroid hormone, intact (no Ca); Future -     VITAMIN D 25 Hydroxy (Vit-D Deficiency, Fractures); Future -     Magnesium; Future -     Magnesium -     VITAMIN D 25 Hydroxy (Vit-D Deficiency, Fractures) -     Parathyroid hormone, intact (no Ca) -     Renal function panel  Vitamin D deficiency -     Renal function panel; Future -     Parathyroid hormone, intact (no Ca); Future -     VITAMIN D 25 Hydroxy (Vit-D Deficiency,  Fractures); Future -     Magnesium; Future -     Magnesium -     VITAMIN D 25 Hydroxy (Vit-D Deficiency, Fractures) -     Parathyroid hormone, intact (no Ca) -     Renal function panel  Hyperparathyroidism (HCC)  Osteopenia of multiple sites -     DG Bone Density; Future  Primary hyperparathyroidism (HCC) -     DG Bone Density; Future -     NM Parathyroid W/Spect/CT; Future    DISPOSITION Follow up in clinic in 1 month suggested.  All questions answered and [name] verbalized understanding of Mary plan.  Mary Joleah Kosak, MD [facility] Endocrinology [facility] [address] [place], [address] [place], [place] [address] Phone # [phone]  At least part of this note was generated using voice recognition software. Inadvertent word errors may have occurred, which were not recognized during Mary proofreading process.

## 2022-11-24 NOTE — Patient Instructions (Signed)
Lab today.

## 2022-11-25 LAB — PARATHYROID HORMONE, INTACT (NO CA): PTH: 115 pg/mL — ABNORMAL HIGH (ref 16–77)

## 2022-11-30 ENCOUNTER — Telehealth: Payer: Self-pay

## 2022-11-30 NOTE — Telephone Encounter (Signed)
spoke with patient concerning lab results, no questions at this time for MD.

## 2022-12-03 ENCOUNTER — Encounter: Payer: Self-pay | Admitting: Certified Registered Nurse Anesthetist

## 2022-12-08 ENCOUNTER — Encounter: Payer: Self-pay | Admitting: Gastroenterology

## 2022-12-08 ENCOUNTER — Ambulatory Visit (AMBULATORY_SURGERY_CENTER): Payer: Self-pay | Admitting: Gastroenterology

## 2022-12-08 VITALS — BP 108/66 | HR 76 | Temp 98.0°F | Resp 12 | Ht 64.0 in | Wt 136.0 lb

## 2022-12-08 DIAGNOSIS — D125 Benign neoplasm of sigmoid colon: Secondary | ICD-10-CM

## 2022-12-08 DIAGNOSIS — K222 Esophageal obstruction: Secondary | ICD-10-CM

## 2022-12-08 DIAGNOSIS — K449 Diaphragmatic hernia without obstruction or gangrene: Secondary | ICD-10-CM

## 2022-12-08 DIAGNOSIS — R1310 Dysphagia, unspecified: Secondary | ICD-10-CM

## 2022-12-08 DIAGNOSIS — Z1211 Encounter for screening for malignant neoplasm of colon: Secondary | ICD-10-CM

## 2022-12-08 HISTORY — PX: COLONOSCOPY: SHX174

## 2022-12-08 HISTORY — PX: UPPER GASTROINTESTINAL ENDOSCOPY: SHX188

## 2022-12-08 MED ORDER — SODIUM CHLORIDE 0.9 % IV SOLN
4.0000 mg | Freq: Once | INTRAVENOUS | Status: AC
Start: 2022-12-08 — End: 2022-12-08
  Administered 2022-12-08: 4 mg via INTRAVENOUS

## 2022-12-08 MED ORDER — PANTOPRAZOLE SODIUM 40 MG PO TBEC: 40.0000 mg | DELAYED_RELEASE_TABLET | Freq: Two times a day (BID) | ORAL | 3 refills | Status: DC

## 2022-12-08 MED ORDER — SODIUM CHLORIDE 0.9 % IV SOLN: 500.0000 mL | Freq: Once | INTRAVENOUS | Status: DC

## 2022-12-08 MED ORDER — LIDOCAINE VISCOUS HCL 2 % MT SOLN
10.0000 mL | OROMUCOSAL | 0 refills | Status: DC | PRN
Start: 2022-12-08 — End: 2023-03-28

## 2022-12-08 MED ORDER — SUCRALFATE 1 GM/10ML PO SUSP: 1.0000 g | Freq: Three times a day (TID) | ORAL | 1 refills | Status: DC

## 2022-12-08 NOTE — Progress Notes (Signed)
1603 Ephedrine 10 mg given IV due to low BP, MD updated.

## 2022-12-08 NOTE — Progress Notes (Signed)
Called to room to assist during endoscopic procedure.  Patient ID and intended procedure confirmed with present staff. Received instructions for my participation in the procedure from the performing physician.  

## 2022-12-08 NOTE — Progress Notes (Signed)
1440 Robinul 0.1 mg IV given due large amount of secretions upon assessment.  MD made aware, vss  

## 2022-12-08 NOTE — Op Note (Addendum)
[facility] Patient Name: Mary [name] Procedure Date: 12/08/2022 2:25 PM MRN: [id number] Endoscopist: Napoleon Form , MD, [id number] Age: 57 Referring MD:  Date of Birth: 1965-04-01 Gender: Female Account #: [id number] Procedure:                Upper GI endoscopy Indications:              Dysphagia Medicines:                Monitored Anesthesia Care Procedure:                Pre-Anesthesia Assessment:                           - Prior to the procedure, a History and Physical                            was performed, and patient medications and                            allergies were reviewed. The patient's tolerance of                            previous anesthesia was also reviewed. The risks                            and benefits of the procedure and the sedation                            options and risks were discussed with the patient.                            All questions were answered, and informed consent                            was obtained. Prior Anticoagulants: The patient has                            taken no anticoagulant or antiplatelet agents. ASA                            Grade Assessment: III - A patient with severe                            systemic disease. After reviewing the risks and                            benefits, the patient was deemed in satisfactory                            condition to undergo the procedure.                           After obtaining informed consent, the endoscope was  passed under direct vision. Throughout the                            procedure, the patient's blood pressure, pulse, and                            oxygen saturations were monitored continuously. The                            Olympus Scope [id number] was introduced through the                            mouth, and advanced to the second part of duodenum.                            The upper GI endoscopy was  accomplished without                            difficulty. The patient tolerated the procedure                            well. Scope In: Scope Out: Findings:                 The Z-line was regular and was found 38 cm from the                            incisors.                           One benign-appearing, intrinsic mild stenosis was                            found 37 to 38 cm from the incisors. This stenosis                            measured less than one cm (in length). The stenosis                            was traversed. The scope was withdrawn. Dilation                            was performed with a Maloney dilator with mild                            resistance at 54 Fr. The dilation site was examined                            following endoscope reinsertion and showed mild                            mucosal disruption and moderate improvement in  luminal narrowing. Biopsies were obtained from the                            proximal and distal esophagus with cold forceps for                            histology of suspected eosinophilic esophagitis.                           A 3 cm hiatal hernia was present.                           The exam of the stomach was otherwise normal.                           The examined duodenum was normal. Complications:            No immediate complications. Estimated Blood Loss:     Estimated blood loss was minimal. Impression:               - Z-line regular, 38 cm from the incisors.                           - Benign-appearing esophageal stenosis. Dilated.                           - 3 cm hiatal hernia.                           - Normal examined duodenum.                           - Biopsies were taken with a cold forceps for                            evaluation of eosinophilic esophagitis. Recommendation:           - Patient has a contact number available for                            emergencies. The  signs and symptoms of potential                            delayed complications were discussed with the                            patient. Return to normal activities tomorrow.                            Written discharge instructions were provided to the                            patient.                           - Soft diet X 1 week and then resume previous diet.                           -  Continue present medications.                           - Await pathology results.                           - Follow an antireflux regimen.                           - Use Protonix (pantoprazole) 40 mg PO BID. Rx for                            90 days with 3 refills                           - Visocous lidocaine 10cc q4h prn for sore throat                           - Carafate 1 gm suspension before meals and at                            bedtime for 2 weeks Napoleon Form, MD 12/08/2022 4:12:56 PM This report has been signed electronically.

## 2022-12-08 NOTE — Patient Instructions (Addendum)
- Soft diet X 1 week and then resume previous diet. - Continue present medications. - Await pathology results - Follow an antireflux regimen. - Use Protonix (pantoprazole) 40 mg PO BID. Rx for 90 days with 3 refills - Visocous lidocaine 10cc q4h prn for sore throat - Carafate 1 gm suspension before meals and at bedtime for 2 weeks   YOU HAD AN ENDOSCOPIC PROCEDURE TODAY AT THE [facility]:   Refer to the procedure report that was given to you for any specific questions about what was found during the examination.  If the procedure report does not answer your questions, please call your gastroenterologist to clarify.  If you requested that your care partner not be given the details of your procedure findings, then the procedure report has been included in a sealed envelope for you to review at your convenience later.  YOU SHOULD EXPECT: Some feelings of bloating in the abdomen. Passage of more gas than usual.  Walking can help get rid of the air that was put into your GI tract during the procedure and reduce the bloating. If you had a lower endoscopy (such as a colonoscopy or flexible sigmoidoscopy) you may notice spotting of blood in your stool or on the toilet paper. If you underwent a bowel prep for your procedure, you may not have a normal bowel movement for a few days.  Please Note:  You might notice some irritation and congestion in your nose or some drainage.  This is from the oxygen used during your procedure.  There is no need for concern and it should clear up in a day or so.  SYMPTOMS TO REPORT IMMEDIATELY:  Following lower endoscopy (colonoscopy or flexible sigmoidoscopy):  Excessive amounts of blood in the stool  Significant tenderness or worsening of abdominal pains  Swelling of the abdomen that is new, acute  Fever of 100F or higher  Following upper endoscopy (EGD)  Vomiting of blood or coffee ground material  New chest pain or pain under the shoulder  blades  Painful or persistently difficult swallowing  New shortness of breath  Fever of 100F or higher  Black, tarry-looking stools  For urgent or emergent issues, a gastroenterologist can be reached at any hour by calling [phone]. Do not use MyChart messaging for urgent concerns.    DIET:  We do recommend a small meal at first, but then you may proceed to your regular diet.  Drink plenty of fluids but you should avoid alcoholic beverages for 24 hours.  ACTIVITY:  You should plan to take it easy for the rest of today and you should NOT DRIVE or use heavy machinery until tomorrow (because of the sedation medicines used during the test).    FOLLOW UP: Our staff will call the number listed on your records the next business day following your procedure.  We will call around 7:15- 8:00 am to check on you and address any questions or concerns that you may have regarding the information given to you following your procedure. If we do not reach you, we will leave a message.     If any biopsies were taken you will be contacted by phone or by letter within the next 1-3 weeks.  Please call us at [phone] if you have not heard about the biopsies in 3 weeks.    SIGNATURES/CONFIDENTIALITY: You and/or your care partner have signed paperwork which will be entered into your electronic medical record.  These signatures attest to the fact  that that the information above on your After Visit Summary has been reviewed and is understood.  Full responsibility of the confidentiality of this discharge information lies with you and/or your care-partner.

## 2022-12-08 NOTE — Op Note (Signed)
[facility] Patient Name: Mary [name] Procedure Date: 12/08/2022 2:19 PM MRN: [id number] Endoscopist: Napoleon Form , MD, [id number] Age: 57 Referring MD:  Date of Birth: 08-Mar-1966 Gender: Female Account #: [id number] Procedure:                Colonoscopy Indications:              Screening for colorectal malignant neoplasm Medicines:                Monitored Anesthesia Care Procedure:                Pre-Anesthesia Assessment:                           - Prior to the procedure, a History and Physical                            was performed, and patient medications and                            allergies were reviewed. The patient's tolerance of                            previous anesthesia was also reviewed. The risks                            and benefits of the procedure and the sedation                            options and risks were discussed with the patient.                            All questions were answered, and informed consent                            was obtained. Prior Anticoagulants: The patient has                            taken no anticoagulant or antiplatelet agents. ASA                            Grade Assessment: III - A patient with severe                            systemic disease. After reviewing the risks and                            benefits, the patient was deemed in satisfactory                            condition to undergo the procedure.                           After obtaining informed consent, the colonoscope  was passed under direct vision. Throughout the                            procedure, the patient's blood pressure, pulse, and                            oxygen saturations were monitored continuously. The                            Olympus Scope [id number] was introduced through the                            anus and advanced to the the cecum, identified by                             appendiceal orifice and ileocecal valve. The                            colonoscopy was performed without difficulty. The                            patient tolerated the procedure well. The quality                            of the bowel preparation was good. The ileocecal                            valve, appendiceal orifice, and rectum were                            photographed. Scope In: 3:54:23 PM Scope Out: 4:06:17 PM Scope Withdrawal Time: 0 hours 8 minutes 23 seconds  Total Procedure Duration: 0 hours 11 minutes 54 seconds  Findings:                 The perianal and digital rectal examinations were                            normal.                           A 5 mm polyp was found in the sigmoid colon. The                            polyp was sessile. The polyp was removed with a                            cold snare. Resection and retrieval were complete.                           Non-bleeding external and internal hemorrhoids were                            found during retroflexion. The hemorrhoids were  medium-sized. Complications:            No immediate complications. Estimated Blood Loss:     Estimated blood loss was minimal. Impression:               - One 5 mm polyp in the sigmoid colon, removed with                            a cold snare. Resected and retrieved.                           - Non-bleeding external and internal hemorrhoids. Recommendation:           - Patient has a contact number available for                            emergencies. The signs and symptoms of potential                            delayed complications were discussed with the                            patient. Return to normal activities tomorrow.                            Written discharge instructions were provided to the                            patient.                           - Resume previous diet.                           - Continue present  medications.                           - Await pathology results.                           - Repeat colonoscopy in 5-10 years for surveillance. Napoleon Form, MD 12/08/2022 4:09:43 PM This report has been signed electronically.

## 2022-12-08 NOTE — Progress Notes (Signed)
Last used marijuana 12-06-22

## 2022-12-08 NOTE — Progress Notes (Signed)
[facility] History and Physical   Primary Care Physician:  Modena Slater, DO   Reason for Procedure:  Dysphagia, colorectal cancer screening  Plan:    EGD and colonoscopy with possible interventions as needed     HPI: Mary [name] is a very pleasant 57 y.o. female here for EGD and colonoscopy for evaluation of dysphagia and colorectal cancer screening. Please refer to office visit note October 12, 2022 by [facility] for additional details  The risks and benefits as well as alternatives of endoscopic procedure(s) have been discussed and reviewed. All questions answered. The patient agrees to proceed.    Past Medical History:  Diagnosis Date   Bipolar depression (HCC)    Borderline personality disorder (HCC)    COPD (chronic obstructive pulmonary disease) (HCC)    Depression    GERD (gastroesophageal reflux disease)    Hiatal hernia    History of alcohol abuse    per pt none since 10/ 2014   History of attempted suicide    History of drug abuse in remission (HCC)    per pt in remission since 12-26-2012  polysubstance dependence (alcohol, crack, cocaine, 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   History of hepatitis C per pt first dx 2009 (approx)   consulted w/ dr comer (infectious disease) note 04-26-2016 , pt states was called and told per lab work no longer has hepatitis c    History of kidney stones    Hypercalcemia    mild and long-standing--- pt asymptomatic   Hyperlipidemia    Nausea and vomiting 11/08/2022   Primary hyperparathyroidism [facility])    endocrinology --  dr Lucianne Muss   Pruritic erythematous rash    lower legs   PTSD (post-traumatic stress disorder)    Right ureteral stone    Schizophrenia (HCC)    Substance abuse (HCC)    Vitamin D deficiency     Past Surgical History:  Procedure Laterality Date   CYSTOSCOPY N/A 01/10/2017   Procedure: CYSTOSCOPY;  Surgeon: Hermina Staggers, MD;  Location: [facility];  Service: Gynecology;  Laterality: N/A;    CYSTOSCOPY W/ URETERAL STENT PLACEMENT Right 07/14/2016   Procedure: CYSTOSCOPY WITH STENT REPLACEMENT;  Surgeon: Malen Gauze, MD;  Location: [facility];  Service: Urology;  Laterality: Right;   CYSTOSCOPY WITH RETROGRADE PYELOGRAM, URETEROSCOPY AND STENT PLACEMENT Right 06/28/2016   Procedure: CYSTOSCOPY WITH RETROGRADE PYELOGRAM, URETEROSCOPY AND STENT PLACEMENT;  Surgeon: Malen Gauze, MD;  Location: [facility];  Service: Urology;  Laterality: Right;   CYSTOSCOPY/RETROGRADE/URETEROSCOPY/STONE EXTRACTION WITH BASKET Right 07/14/2016   Procedure: CYSTOSCOPY/RETROGRADE/URETEROSCOPY/STONE EXTRACTION WITH BASKET;  Surgeon: Malen Gauze, MD;  Location: [facility];  Service: Urology;  Laterality: Right;   DIAGNOSTIC LAPAROSCOPIC LIVER BIOPSY N/A 02/10/2013   Procedure: DIAGNOSTIC LAPAROSCOPIC ;  Surgeon: Ardeth Sportsman, MD;  Location: [facility];  Service: General;  Laterality: N/A;  DIAGNOSTIC LAPAROSCOPY,laparoscopic ventral hernia repair with mesh lysis of adhesions   HERNIA REPAIR     HOLMIUM LASER APPLICATION Right 07/14/2016   Procedure: HOLMIUM LASER APPLICATION;  Surgeon: Malen Gauze, MD;  Location: [facility];  Service: Urology;  Laterality: Right;   SPLENECTOMY  2004   TUBAL LIGATION     VAGINAL HYSTERECTOMY N/A 01/10/2017   Procedure: HYSTERECTOMY VAGINAL W/ BILATERAL SALPINGECTOMY WITH REPAIR OF INCIDENTAL CYSTOTOMY;  Surgeon: Hermina Staggers, MD;  Location: [facility];  Service: Gynecology;  Laterality: N/A;    Prior to Admission medications   Medication Sig  Start Date End Date Taking? Authorizing Provider  buPROPion (WELLBUTRIN XL) 300 MG 24 hr tablet Take 1 tablet (300 mg total) by mouth every morning. 09/27/22  Yes Toy Cookey E, NP  divalproex (DEPAKOTE) 500 MG DR tablet Take 1 tablet (500 mg total) by mouth 3 (three) times daily. 09/27/22  Yes Toy Cookey E, NP  pantoprazole (PROTONIX) 40 MG tablet Take 1 tablet  (40 mg total) by mouth at bedtime. 10/12/22  Yes McMichael, Bayley M, PA-C  QUEtiapine (SEROQUEL) 50 MG tablet Take 1 tablet (50 mg total) by mouth at bedtime. 09/27/22  Yes Toy Cookey E, NP  rosuvastatin (CRESTOR) 10 MG tablet Take 1 tablet (10 mg total) by mouth daily. 11/10/22 11/10/23 Yes Patel, Azucena Cecil, DO  clobetasol cream (TEMOVATE) 0.05 % Apply topically to affected area twice daily for no longer than 2 weeks consistently, then as needed after then. Wash hands well after use. Patient not taking: Reported on 10/12/2022 01/21/22   Allwardt, Alyssa M, PA-C  ondansetron (ZOFRAN) 4 MG tablet TAKE 1 TABLET BY MOUTH EVERY 8 HOURS AS NEEDED FOR NAUSEA FOR VOMITING Patient not taking: Reported on 12/08/2022 11/22/22   Modena Slater, DO  fluticasone-salmeterol (ADVAIR HFA) 230-21 MCG/ACT inhaler Inhale 2 puffs into the lungs 2 (two) times daily. Rinse mouth after each use Patient not taking: Reported on 02/14/2017 07/04/16 07/25/19  Nche, Bonna Gains, NP    Current Outpatient Medications  Medication Sig Dispense Refill   buPROPion (WELLBUTRIN XL) 300 MG 24 hr tablet Take 1 tablet (300 mg total) by mouth every morning. 30 tablet 3   divalproex (DEPAKOTE) 500 MG DR tablet Take 1 tablet (500 mg total) by mouth 3 (three) times daily. 90 tablet 3   pantoprazole (PROTONIX) 40 MG tablet Take 1 tablet (40 mg total) by mouth at bedtime. 30 tablet 3   QUEtiapine (SEROQUEL) 50 MG tablet Take 1 tablet (50 mg total) by mouth at bedtime. 90 tablet 1   rosuvastatin (CRESTOR) 10 MG tablet Take 1 tablet (10 mg total) by mouth daily. 90 tablet 1   clobetasol cream (TEMOVATE) 0.05 % Apply topically to affected area twice daily for no longer than 2 weeks consistently, then as needed after then. Wash hands well after use. (Patient not taking: Reported on 10/12/2022) 45 g 0   ondansetron (ZOFRAN) 4 MG tablet TAKE 1 TABLET BY MOUTH EVERY 8 HOURS AS NEEDED FOR NAUSEA FOR VOMITING (Patient not taking: Reported on 12/08/2022) 10 tablet  0   Current Facility-Administered Medications  Medication Dose Route Frequency Provider Last Rate Last Admin   0.9 %  sodium chloride infusion  500 mL Intravenous Once Napoleon Form, MD        Allergies as of 12/08/2022   (No Known Allergies)    Family History  Problem Relation Age of Onset   Alcohol abuse Mother    Alcohol abuse Father    Alcohol abuse Sister    Colon cancer Maternal Grandmother        colon   Diabetes Maternal Grandmother    Hypertension Maternal Grandmother    Thyroid disease Neg Hx    Stomach cancer Neg Hx    Esophageal cancer Neg Hx    Rectal cancer Neg Hx     Social History   Socioeconomic History   Marital status: Single    Spouse name: Not on file   Number of children: 5   Years of education: Not on file   Highest education level: Not on file  Occupational History   Not on file  Tobacco Use   Smoking status: Every Day    Current packs/day: 1.00    Average packs/day: 1 pack/day for 32.0 years (32.0 ttl pk-yrs)    Types: Cigarettes   Smokeless tobacco: Never  Vaping Use   Vaping status: Never Used  Substance and Sexual Activity   Alcohol use: No    Comment: quit 12/2012   Drug use: No    Types: "Crack" cocaine, Marijuana, Cocaine    Comment: per pt quit 12-26-2012 marijuana 2 times a week   Sexual activity: Yes    Birth control/protection: None, Surgical  Other Topics Concern   Not on file  Social History Narrative   Not on file   Social Determinants of Health   Financial Resource Strain: Not on file  Food Insecurity: Not on file  Transportation Needs: Not on file  Physical Activity: Not on file  Stress: Not on file  Social Connections: Not on file  Intimate Partner Violence: Not on file    Review of Systems:  All other review of systems negative except as mentioned in the HPI.  Physical Exam: Vital signs in last 24 hours: BP 119/72   Pulse 61   Temp 98 F (36.7 C)   Ht 5\' 4"  (1.626 m)   Wt 136 lb (61.7 kg)    LMP  (LMP Unknown)   SpO2 98%   BMI 23.34 kg/m  General:   Alert, NAD Lungs:  Clear .   Heart:  Regular rate and rhythm Abdomen:  Soft, nontender and nondistended. Neuro/Psych:  Alert and cooperative. Normal mood and affect. A and O x 3  Reviewed labs, radiology imaging, old records and pertinent past GI work up  Patient is appropriate for planned procedure(s) and anesthesia in an ambulatory setting   K. Scherry Ran , MD [phone]

## 2022-12-08 NOTE — Progress Notes (Signed)
Report given to PACU, vss 

## 2022-12-08 NOTE — Progress Notes (Signed)
Patient c/o nausea and spitting up small amount of oral secretions, obtained order and adm Zofran 4 mg IVP at 1625. Patient reports some relief of nausea.

## 2022-12-11 ENCOUNTER — Other Ambulatory Visit: Payer: Self-pay

## 2022-12-11 ENCOUNTER — Encounter (HOSPITAL_COMMUNITY)
Admission: RE | Admit: 2022-12-11 | Disposition: A | Discharge status: Home / Self Care | Payer: Medicaid Other | Source: Ambulatory Visit | Attending: Endocrinology

## 2022-12-11 ENCOUNTER — Encounter (HOSPITAL_COMMUNITY): Payer: Medicaid Other | Attending: Endocrinology

## 2022-12-11 ENCOUNTER — Telehealth: Payer: Self-pay

## 2022-12-11 NOTE — Telephone Encounter (Signed)
  Follow up Call-     12/08/2022    2:23 PM  Call back number  Post procedure Call Back phone  # [phone]  Permission to leave phone message Yes     Patient questions:  Do you have a fever, pain , or abdominal swelling? No. Pain Score  0 *  Have you tolerated food without any problems? Yes.    Have you been able to return to your normal activities? Yes.    Do you have any questions about your discharge instructions: Diet   No. Medications  No. Follow up visit  No.  Do you have questions or concerns about your Care? No.  Actions: * If pain score is 4 or above: No action needed, pain <4.

## 2022-12-13 ENCOUNTER — Other Ambulatory Visit (HOSPITAL_COMMUNITY): Payer: Self-pay

## 2022-12-13 ENCOUNTER — Ambulatory Visit (INDEPENDENT_AMBULATORY_CARE_PROVIDER_SITE_OTHER): Payer: Self-pay | Admitting: Student

## 2022-12-13 ENCOUNTER — Encounter: Payer: Self-pay | Admitting: Student

## 2022-12-13 VITALS — BP 110/56 | HR 57 | Temp 98.1°F | Ht 64.0 in | Wt 135.2 lb

## 2022-12-13 DIAGNOSIS — Z23 Encounter for immunization: Secondary | ICD-10-CM

## 2022-12-13 DIAGNOSIS — E785 Hyperlipidemia, unspecified: Secondary | ICD-10-CM

## 2022-12-13 DIAGNOSIS — F3130 Bipolar disorder, current episode depressed, mild or moderate severity, unspecified: Secondary | ICD-10-CM

## 2022-12-13 DIAGNOSIS — E210 Primary hyperparathyroidism: Secondary | ICD-10-CM | POA: Insufficient documentation

## 2022-12-13 DIAGNOSIS — R112 Nausea with vomiting, unspecified: Secondary | ICD-10-CM

## 2022-12-13 LAB — SURGICAL PATHOLOGY

## 2022-12-13 MED ORDER — SUCRALFATE 1 G PO TABS: 1.0000 g | ORAL_TABLET | Freq: Four times a day (QID) | ORAL | 0 refills | Status: AC

## 2022-12-13 MED ORDER — PANTOPRAZOLE SODIUM 40 MG PO TBEC
40.0000 mg | DELAYED_RELEASE_TABLET | Freq: Two times a day (BID) | ORAL | 1 refills | Status: AC
  Filled 2022-12-13: qty 30, 15d supply, fill #0

## 2022-12-13 NOTE — Progress Notes (Signed)
Established Patient Office Visit  Subjective   Patient ID: Mary [name], female    DOB: 06-04-1965  Age: 57 y.o. MRN: [id number]  Chief Complaint  Patient presents with   Follow-up    HPI  This is a 57 year old female living with a history stated below and presents today for follow up on EGD and colonoscopy. Please see problem based assessment and plan for additional details.    Patient Active Problem List   Diagnosis Date Noted   Primary hyperparathyroidism (HCC) 12/13/2022   Hyperlipidemia 12/13/2022   Encounter for screening mammogram for malignant neoplasm of breast 11/08/2022   Screening for hypercholesterolemia 11/08/2022   Screening for diabetes mellitus 11/08/2022   Tobacco dependence 12/06/2020   GAD (generalized anxiety disorder) 11/11/2019   Bipolar I disorder, most recent episode depressed (HCC) 11/11/2019   Post-operative state 01/10/2017   Ureteral calculus 06/28/2016   Pruritic erythematous rash 04/12/2016   Reactive airway disease without asthma 04/12/2016   Chronic hepatitis C without hepatic coma (HCC)    Drug abuse and dependence (HCC)    Incarcerated incisional hernia s/p lap repair 02/10/2013 02/10/2013   Polysubstance dependence (HCC) 10/16/2011   Substance induced mood disorder (HCC) 10/16/2011   Nausea and vomiting 07/04/2011   Past Medical History:  Diagnosis Date   Bipolar depression (HCC)    Borderline personality disorder (HCC)    COPD (chronic obstructive pulmonary disease) (HCC)    Depression    GERD (gastroesophageal reflux disease)    Hiatal hernia    History of alcohol abuse    per pt none since 10/ 2014   History of attempted suicide    History of drug abuse in remission (HCC)    per pt in remission since 12-26-2012  polysubstance dependence (alcohol, crack, cocaine, 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   History of hepatitis C per pt first dx 2009 (approx)   consulted w/ dr comer (infectious disease) note 04-26-2016 , pt  states was called and told per lab work no longer has hepatitis c    History of kidney stones    Hypercalcemia    mild and long-standing--- pt asymptomatic   Hyperlipidemia    Nausea and vomiting 07/04/2011   Primary hyperparathyroidism [facility])    endocrinology --  dr Lucianne Muss   Pruritic erythematous rash    lower legs   PTSD (post-traumatic stress disorder)    Right ureteral stone    Schizophrenia (HCC)    Substance abuse (HCC)    Vitamin D deficiency    Past Surgical History:  Procedure Laterality Date   COLONOSCOPY  12/08/2022   at [facility] with Scherry Ran at same time as egd   CYSTOSCOPY N/A 01/10/2017   Procedure: CYSTOSCOPY;  Surgeon: Hermina Staggers, MD;  Location: [facility];  Service: Gynecology;  Laterality: N/A;   CYSTOSCOPY W/ URETERAL STENT PLACEMENT Right 07/14/2016   Procedure: CYSTOSCOPY WITH STENT REPLACEMENT;  Surgeon: Malen Gauze, MD;  Location: [facility];  Service: Urology;  Laterality: Right;   CYSTOSCOPY WITH RETROGRADE PYELOGRAM, URETEROSCOPY AND STENT PLACEMENT Right 06/28/2016   Procedure: CYSTOSCOPY WITH RETROGRADE PYELOGRAM, URETEROSCOPY AND STENT PLACEMENT;  Surgeon: Malen Gauze, MD;  Location: [facility];  Service: Urology;  Laterality: Right;   CYSTOSCOPY/RETROGRADE/URETEROSCOPY/STONE EXTRACTION WITH BASKET Right 07/14/2016   Procedure: CYSTOSCOPY/RETROGRADE/URETEROSCOPY/STONE EXTRACTION WITH BASKET;  Surgeon: Malen Gauze, MD;  Location: [facility];  Service: Urology;  Laterality: Right;   DIAGNOSTIC LAPAROSCOPIC LIVER BIOPSY N/A 02/10/2013  Procedure: DIAGNOSTIC LAPAROSCOPIC ;  Surgeon: Ardeth Sportsman, MD;  Location: [facility];  Service: General;  Laterality: N/A;  DIAGNOSTIC LAPAROSCOPY,laparoscopic ventral hernia repair with mesh lysis of adhesions   HERNIA REPAIR     HOLMIUM LASER APPLICATION Right 07/14/2016   Procedure: HOLMIUM LASER APPLICATION;  Surgeon: Malen Gauze, MD;  Location: [facility];  Service: Urology;  Laterality: Right;   SPLENECTOMY  2004   TUBAL LIGATION     UPPER GASTROINTESTINAL ENDOSCOPY  12/08/2022   with colonoscopy at [facility] , Veena Nandigam, Hiatal Hernia, esophageal stricture, dilation,   VAGINAL HYSTERECTOMY N/A 01/10/2017   Procedure: HYSTERECTOMY VAGINAL W/ BILATERAL SALPINGECTOMY WITH REPAIR OF INCIDENTAL CYSTOTOMY;  Surgeon: Hermina Staggers, MD;  Location: [facility];  Service: Gynecology;  Laterality: N/A;   Social History   Socioeconomic History   Marital status: Single    Spouse name: Not on file   Number of children: 5   Years of education: Not on file   Highest education level: Not on file  Occupational History   Not on file  Tobacco Use   Smoking status: Every Day    Current packs/day: 1.00    Average packs/day: 1 pack/day for 32.0 years (32.0 ttl pk-yrs)    Types: Cigarettes   Smokeless tobacco: Never  Vaping Use   Vaping status: Never Used  Substance and Sexual Activity   Alcohol use: Not Currently    Comment: quit 12/2012   Drug use: Not Currently    Types: "Crack" cocaine, Marijuana, Cocaine    Comment: per pt quit 12-26-2012 marijuana 2 times a week   Sexual activity: Yes    Birth control/protection: Surgical, Post-menopausal  Other Topics Concern   Not on file  Social History Narrative   Not on file   Social Determinants of Health   Financial Resource Strain: Not on file  Food Insecurity: Not on file  Transportation Needs: Not on file  Physical Activity: Not on file  Stress: Not on file  Social Connections: Not on file  Intimate Partner Violence: Not on file   Family Status  Relation Name Status   Mother  Deceased   Father  Deceased   Sister  Deceased   MGM  (Not Specified)   Neg Hx  (Not Specified)  No partnership data on file   Family History  Problem Relation Age of Onset   Alcohol abuse Mother    Alcohol abuse Father    Alcohol abuse Sister    Colon cancer Maternal Grandmother        colon    Diabetes Maternal Grandmother    Hypertension Maternal Grandmother    Thyroid disease Neg Hx    Stomach cancer Neg Hx    Esophageal cancer Neg Hx    Rectal cancer Neg Hx    No Known Allergies   ROS    Objective:     BP (!) 110/56 (BP Location: Right Arm, Patient Position: Sitting, Cuff Size: Normal)   Pulse (!) 57   Temp 98.1 F (36.7 C) (Oral)   Ht 5\' 4"  (1.626 m)   Wt 135 lb 3.2 oz (61.3 kg)   LMP  (LMP Unknown)   SpO2 100%   BMI 23.21 kg/m  BP Readings from Last 3 Encounters:  12/13/22 (!) 110/56  12/08/22 108/66  11/24/22 100/60   Wt Readings from Last 3 Encounters:  12/13/22 135 lb 3.2 oz (61.3 kg)  12/08/22 136 lb (61.7 kg)  11/24/22 131 lb 12.8  oz (59.8 kg)      Physical Exam   No results found for any visits on 12/13/22.  Last CBC Lab Results  Component Value Date   WBC 11.1 (H) 10/04/2022   HGB 12.6 10/04/2022   HCT 37.5 10/04/2022   MCV 91.2 10/04/2022   MCH 30.7 10/04/2022   RDW 13.1 10/04/2022   PLT 277 10/04/2022   Last metabolic panel Lab Results  Component Value Date   GLUCOSE 92 11/24/2022   NA 138 11/24/2022   K 4.3 11/24/2022   CL 104 11/24/2022   CO2 31 11/24/2022   BUN 10 11/24/2022   CREATININE 0.74 11/24/2022   GFR 89.27 11/24/2022   CALCIUM 11.1 (H) 11/24/2022   PHOS 3.4 11/24/2022   PROT 6.8 10/04/2022   ALBUMIN 4.2 11/24/2022   BILITOT 0.9 10/04/2022   ALKPHOS 84 10/04/2022   AST 20 10/04/2022   ALT 16 10/04/2022   ANIONGAP 7 10/04/2022   Last lipids Lab Results  Component Value Date   CHOL 201 (H) 11/08/2022   HDL 73 11/08/2022   LDLCALC 118 (H) 11/08/2022   TRIG 57 11/08/2022   CHOLHDL 2.8 11/08/2022   Last hemoglobin A1c Lab Results  Component Value Date   HGBA1C 5.5 11/08/2022   Last thyroid functions Lab Results  Component Value Date   TSH 1.275 10/04/2022   Last vitamin D Lab Results  Component Value Date   VD25OH 18.41 (L) 11/24/2022      The 10-year ASCVD risk score (Arnett DK, et  al., 2019) is: 4.4%    Assessment & Plan:   Problem List Items Addressed This Visit       Digestive   Nausea and vomiting - Primary (Chronic)    Patient was referred to gastroenterology for upper GI endoscopy and colonoscopy per the last office visit concerning for dysphagia. Patient had the procedure on December 08, 2022. Upper GI endoscopy for dysphagia showed one benign- appearing, intrinsic mild stenosis was found 37 to 38 cm from the incisors. Dilation was performed with a Maloney dilator. A 3 cm hiatal hernia was present. Otherwise, normal stomach and duodenum. Colonoscopy that showed one 5 mm polyp in the sigmoid colon, which was removed with a cold snare. Non- bleeding external and internal hemorrhoids. Patient reports dysphagia, she feels food gettring stuck at the end of the esophagus. Reports improvement with nausea and vomiting since the last visit.  The gastroenterologist prescribed her Carafate and Protonix, she says that she was unable to afford these medications that is why she did not pick it up.  She has been taking Protonix 40 mg daily that was sent by our clinic. -I sent another prescription of Protonix 40 mg to our [facility], patient was advised to take 2 tablets twice daily. -I sent Carafate to the [facility], they have a good Rx coupon that she can use, patient was advised to take Carafate before meals and at bedtime for 2 weeks. -Patient was advised to apply for Medicaid for insurance coverage.        Endocrine   Primary hyperparathyroidism (HCC)    Lab Results  Component Value Date   CALCIUM 11.1 (H) 11/24/2022   PHOS 3.4 11/24/2022  Last vitamin D Lab Results  Component Value Date   VD25OH 18.41 (L) 11/24/2022   Patient was referred to endocrinologist. 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 She saw endo  on 9/13 who ordered RFP,  PTH, Vit D 25, and Mg. Her RFP showed elevated Ca 11.1, PTH elevated 115.  Patient missed her follow-up appointment with endocrinologist.  Patient was advised to call her endocrinologist and schedule an appointment so she could get the parathyroid nuclear scan. -awaiting parathyroid nuclear scan per endocrinologist        Other   Bipolar I disorder, most recent episode depressed (HCC)    Takes Wellbutrin 300 mg nightly, quetiapine 500 mg nightly, Depakote 500 mg daily, without any acute complain or concern at this time      Hyperlipidemia    Lab Results  Component Value Date   CHOL 201 (H) 11/08/2022   HDL 73 11/08/2022   LDLCALC 118 (H) 11/08/2022   TRIG 57 11/08/2022   CHOLHDL 2.8 11/08/2022   Patient is taking Crestor 10 mg daily, without any acute complaint or concerns at this time.  We will recheck her lipids in the next office visit. -Continue Crestor 10 mg daily -Check lipids at the next office visit      Other Visit Diagnoses     Encounter for immunization       Relevant Orders   Flu vaccine trivalent PF, 6mos and older(Flulaval,Afluria,Fluarix,Fluzone) (Completed)       Return in about 3 months (around 03/15/2023) for routine follow up .    Jeral Pinch, DO

## 2022-12-13 NOTE — Assessment & Plan Note (Signed)
Takes Wellbutrin 300 mg nightly, quetiapine 500 mg nightly, Depakote 500 mg daily, without any acute complain or concern at this time

## 2022-12-13 NOTE — Assessment & Plan Note (Signed)
Lab Results  Component Value Date   CALCIUM 11.1 (H) 11/24/2022   PHOS 3.4 11/24/2022  Last vitamin D Lab Results  Component Value Date   VD25OH 18.41 (L) 11/24/2022   Patient was referred to endocrinologist. 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 She saw endo on 9/13 who ordered RFP, PTH, Vit D 25, and Mg. Her RFP showed elevated Ca 11.1, PTH elevated 115.  Patient missed her follow-up appointment with endocrinologist.  Patient was advised to call her endocrinologist and schedule an appointment so she could get the parathyroid nuclear scan. -awaiting parathyroid nuclear scan per endocrinologist

## 2022-12-13 NOTE — Patient Instructions (Signed)
Thank you, [name] for allowing [place] to provide your care today. Today we discussed:  Trouble swallowing -I have sent your prescription for pantoprazole to the [facility] community pharmacy.    #Please take pantoprazole 40 mg, 1 tablet by mouth 2 times daily.  -I have sent Carafate to the [facility] pharmacy, please let them know that you want to use the GoodRx coupon.    #Please take Carafate, 4 times daily as needed before meals, and at bedtime for 2 weeks  High Calcium -Please call your endocrinologist to schedule an appointment and get that scan done.  Please call Medicaid to qualify for full benefit medicaid.   I have ordered the following labs for you:  Lab Orders  No laboratory test(s) ordered today     Tests ordered today:  None   Referrals ordered today:   Referral Orders  No referral(s) requested today     I have ordered the following medication/changed the following medications:   Stop the following medications: Medications Discontinued During This Encounter  Medication Reason   pantoprazole (PROTONIX) 40 MG tablet    pantoprazole (PROTONIX) 40 MG tablet    sucralfate (CARAFATE) 1 GM/10ML suspension      Start the following medications: Meds ordered this encounter  Medications   pantoprazole (PROTONIX) 40 MG tablet    Sig: Take 1 tablet (40 mg total) by mouth 2 (two) times daily.    Dispense:  30 tablet    Refill:  1   sucralfate (CARAFATE) 1 g tablet    Sig: Take 1 tablet (1 g total) by mouth 4 (four) times daily. Take 10 mLs (1 g total) by mouth 4 (four) times daily - with meals and at bedtime.    Dispense:  120 tablet    Refill:  0     Follow up:  3 months      Remember:   Should you have any questions or concerns please call the internal medicine clinic at [phone].     Mary Pinch, DO [facility]

## 2022-12-13 NOTE — Assessment & Plan Note (Signed)
Patient was referred to gastroenterology for upper GI endoscopy and colonoscopy per the last office visit concerning for dysphagia. Patient had the procedure on December 08, 2022. Upper GI endoscopy for dysphagia showed one benign- appearing, intrinsic mild stenosis was found 37 to 38 cm from the incisors. Dilation was performed with a Maloney dilator. A 3 cm hiatal hernia was present. Otherwise, normal stomach and duodenum. Colonoscopy that showed one 5 mm polyp in the sigmoid colon, which was removed with a cold snare. Non- bleeding external and internal hemorrhoids. Patient reports dysphagia, she feels food gettring stuck at the end of the esophagus. Reports improvement with nausea and vomiting since the last visit.  The gastroenterologist prescribed her Carafate and Protonix, she says that she was unable to afford these medications that is why she did not pick it up.  She has been taking Protonix 40 mg daily that was sent by our clinic. -I sent another prescription of Protonix 40 mg to our [facility], patient was advised to take 2 tablets twice daily. -I sent Carafate to the [facility], they have a good Rx coupon that she can use, patient was advised to take Carafate before meals and at bedtime for 2 weeks. -Patient was advised to apply for Medicaid for insurance coverage.

## 2022-12-13 NOTE — Assessment & Plan Note (Signed)
Lab Results  Component Value Date   CHOL 201 (H) 11/08/2022   HDL 73 11/08/2022   LDLCALC 118 (H) 11/08/2022   TRIG 57 11/08/2022   CHOLHDL 2.8 11/08/2022   Patient is taking Crestor 10 mg daily, without any acute complaint or concerns at this time.  We will recheck her lipids in the next office visit. -Continue Crestor 10 mg daily -Check lipids at the next office visit

## 2022-12-14 ENCOUNTER — Other Ambulatory Visit: Payer: Self-pay

## 2022-12-15 NOTE — Progress Notes (Signed)
Internal Medicine Clinic Attending  I was physically present during the key portions of the resident provided service and participated in the medical decision making of patient's management care. I reviewed pertinent patient test results.  The assessment, diagnosis, and plan were formulated together and I agree with the documentation in the resident's note.  Mercie Eon, MD

## 2022-12-18 ENCOUNTER — Telehealth: Payer: Self-pay | Admitting: *Deleted

## 2022-12-18 ENCOUNTER — Other Ambulatory Visit (HOSPITAL_COMMUNITY): Payer: Self-pay

## 2022-12-27 ENCOUNTER — Encounter (HOSPITAL_COMMUNITY): Payer: Self-pay | Admitting: Psychiatry

## 2022-12-27 ENCOUNTER — Ambulatory Visit: Payer: Medicaid Other | Admitting: Endocrinology

## 2022-12-27 ENCOUNTER — Telehealth (INDEPENDENT_AMBULATORY_CARE_PROVIDER_SITE_OTHER): Payer: No Payment, Other | Admitting: Psychiatry

## 2022-12-27 DIAGNOSIS — F411 Generalized anxiety disorder: Secondary | ICD-10-CM | POA: Diagnosis not present

## 2022-12-27 DIAGNOSIS — F319 Bipolar disorder, unspecified: Secondary | ICD-10-CM

## 2022-12-27 DIAGNOSIS — F3130 Bipolar disorder, current episode depressed, mild or moderate severity, unspecified: Secondary | ICD-10-CM

## 2022-12-27 MED ORDER — HYDROXYZINE HCL 10 MG PO TABS
10.0000 mg | ORAL_TABLET | Freq: Three times a day (TID) | ORAL | 3 refills | Status: DC | PRN
Start: 2022-12-27 — End: 2023-03-20

## 2022-12-27 MED ORDER — QUETIAPINE FUMARATE 50 MG PO TABS
50.0000 mg | ORAL_TABLET | Freq: Every day | ORAL | 1 refills | Status: DC
Start: 2022-12-27 — End: 2023-03-20

## 2022-12-27 NOTE — Progress Notes (Signed)
BH MD/PA/NP OP Progress Note Virtual Visit via Video Note  I connected with Mary [name] on 12/27/22 at  1:30 PM EDT by a video enabled telemedicine application and verified that I am speaking with the correct person using two identifiers.  Location: Patient: Home Provider: Clinic   I discussed the limitations of evaluation and management by telemedicine and the availability of in person appointments. The patient expressed understanding and agreed to proceed.  I provided 30 minutes of non-face-to-face time during this encounter.       12/27/2022 1:01 PM Mary [name]  MRN:  [id number]  Chief Complaint:   "I weaned myself off of Wellbutrin and Depakote"  HPI: 57 year old female seen today for follow up psychiatric evaluation. 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 She is currently being managed on Wellbutrin XL 300 mg daily, Seroquel 25 mg daily, and Depakote 500 mg three time daily.  She informed Clinical research associate that she has not been able to get the her new prescription.  Today she was well groomed, pleasant, cooperative, and engaged in conversation. She informed Clinical research associate that the she was fired from her job because she was late to many time. She notes that she was unable to afford her medications and weaned herself off of Wellbutrin and Depakote. She now notes that she has been more anxious. she notes that her anxiety is worsened when she is driving.  Recently patient notes that she has been more irritable and impulsive.  She notes that she spends her money unnecessarily.  She denies other symptoms of mania. Today she denies SI/HI/VAH, mania or paranoia.   Today provider conducted a GAD 7 and patient scored a 21, at her last visit she scored a 12. Provider also conducted a PHQ 9 and patient scored a 5, at  her last visit she scored a 6. She endorses adequate sleep and increased appetite. Patient notes that she has gained 6 pounds in the last few months. She notes that she had surgery on her esophagus. She notes that she has been eating better since this surgery.   Today patient agreeable to start hydroxyzine 10 mg three times daily as needed. At this time Wellbutrin and Depakote not restarted. She will continue all other medications as prescribed. No other concerns at this time.   Visit Diagnosis:    ICD-10-CM   1. Bipolar I disorder, most recent episode depressed (HCC)  F31.30 QUEtiapine (SEROQUEL) 50 MG tablet    2. GAD (generalized anxiety disorder)  F41.1 QUEtiapine (SEROQUEL) 50 MG tablet    hydrOXYzine (ATARAX) 10 MG tablet        Past Psychiatric History: cocaine dependence (in remission), marijuana dependence (in remission), borderline, schizophrenia substance induced mood disorder, PTSD, and poly substance use  Past Medical History:  Past Medical History:  Diagnosis Date   Bipolar depression (HCC)    Borderline personality disorder (HCC)    COPD (chronic obstructive pulmonary disease) (HCC)    Depression    GERD (gastroesophageal reflux disease)    Hiatal hernia    History of alcohol abuse    per pt none since 10/ 2014   History of attempted suicide    History of drug abuse in remission (HCC)    per pt in remission since 12-26-2012  polysubstance dependence (alcohol, crack, cocaine, 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  History of hepatitis C per pt first dx 2009 (approx)   consulted w/ dr comer (infectious disease) note 04-26-2016 , pt states was called and told per lab work no longer has hepatitis c    History of kidney stones    Hypercalcemia    mild and long-standing--- pt asymptomatic   Hyperlipidemia    Nausea and vomiting 07/04/2011   Primary hyperparathyroidism [facility])    endocrinology --  dr Lucianne Muss   Pruritic erythematous rash    lower legs   PTSD  (post-traumatic stress disorder)    Right ureteral stone    Schizophrenia (HCC)    Substance abuse (HCC)    Vitamin D deficiency     Past Surgical History:  Procedure Laterality Date   COLONOSCOPY  12/08/2022   at [facility] with Scherry Ran at same time as egd   CYSTOSCOPY N/A 01/10/2017   Procedure: CYSTOSCOPY;  Surgeon: Hermina Staggers, MD;  Location: [facility];  Service: Gynecology;  Laterality: N/A;   CYSTOSCOPY W/ URETERAL STENT PLACEMENT Right 07/14/2016   Procedure: CYSTOSCOPY WITH STENT REPLACEMENT;  Surgeon: Malen Gauze, MD;  Location[facility];  Service: Urology;  Laterality: Right;   CYSTOSCOPY WITH RETROGRADE PYELOGRAM, URETEROSCOPY AND STENT PLACEMENT Right 06/28/2016   Procedure: CYSTOSCOPY WITH RETROGRADE PYELOGRAM, URETEROSCOPY AND STENT PLACEMENT;  Surgeon: Malen Gauze, MD;  Location: [facility];  Service: Urology;  Laterality: Right;   CYSTOSCOPY/RETROGRADE/URETEROSCOPY/STONE EXTRACTION WITH BASKET Right 07/14/2016   Procedure: CYSTOSCOPY/RETROGRADE/URETEROSCOPY/STONE EXTRACTION WITH BASKET;  Surgeon: Malen Gauze, MD;  Location: [facility];  Service: Urology;  Laterality: Right;   DIAGNOSTIC LAPAROSCOPIC LIVER BIOPSY N/A 02/10/2013   Procedure: DIAGNOSTIC LAPAROSCOPIC ;  Surgeon: Ardeth Sportsman, MD;  Location: [facility];  Service: General;  Laterality: N/A;  DIAGNOSTIC LAPAROSCOPY,laparoscopic ventral hernia repair with mesh lysis of adhesions   HERNIA REPAIR     HOLMIUM LASER APPLICATION Right 07/14/2016   Procedure: HOLMIUM LASER APPLICATION;  Surgeon: Malen Gauze, MD;  Location: [facility];  Service: Urology;  Laterality: Right;   SPLENECTOMY  2004   TUBAL LIGATION     UPPER GASTROINTESTINAL ENDOSCOPY  12/08/2022   with colonoscopy at [facility] , Veena Nandigam, Hiatal Hernia, esophageal stricture, dilation,   VAGINAL HYSTERECTOMY N/A 01/10/2017   Procedure: HYSTERECTOMY VAGINAL W/ BILATERAL SALPINGECTOMY  WITH REPAIR OF INCIDENTAL CYSTOTOMY;  Surgeon: Hermina Staggers, MD;  Location: [facility];  Service: Gynecology;  Laterality: N/A;    Family Psychiatric History: Mother heroin use (over dosed and died), sister alcoholic, sister cocaine abuse, and maternal grandmother alcohol use. Five children were born addicted to cocaine.  Family History:  Family History  Problem Relation Age of Onset   Alcohol abuse Mother    Alcohol abuse Father    Alcohol abuse Sister    Colon cancer Maternal Grandmother        colon   Diabetes Maternal Grandmother    Hypertension Maternal Grandmother    Thyroid disease Neg Hx    Stomach cancer Neg Hx    Esophageal cancer Neg Hx    Rectal cancer Neg Hx     Social History:  Social History   Socioeconomic History   Marital status: Single    Spouse name: Not on file   Number of children: 5   Years of education: Not on file   Highest education level: Not on file  Occupational History   Not on file  Tobacco Use   Smoking status: Every Day  Current packs/day: 1.00    Average packs/day: 1 pack/day for 32.0 years (32.0 ttl pk-yrs)    Types: Cigarettes   Smokeless tobacco: Never  Vaping Use   Vaping status: Never Used  Substance and Sexual Activity   Alcohol use: Not Currently    Comment: quit 12/2012   Drug use: Not Currently    Types: "Crack" cocaine, Marijuana, Cocaine    Comment: per pt quit 12-26-2012 marijuana 2 times a week   Sexual activity: Yes    Birth control/protection: Surgical, Post-menopausal  Other Topics Concern   Not on file  Social History Narrative   Not on file   Social Determinants of Health   Financial Resource Strain: Not on file  Food Insecurity: Not on file  Transportation Needs: Not on file  Physical Activity: Not on file  Stress: Not on file  Social Connections: Not on file    Allergies: No Known Allergies  Metabolic Disorder Labs: Lab Results  Component Value Date   HGBA1C 5.5 11/08/2022   No results found  for: "PROLACTIN" Lab Results  Component Value Date   CHOL 201 (H) 11/08/2022   TRIG 57 11/08/2022   HDL 73 11/08/2022   CHOLHDL 2.8 11/08/2022   LDLCALC 118 (H) 11/08/2022   Lab Results  Component Value Date   TSH 1.275 10/04/2022    Therapeutic Level Labs: No results found for: "LITHIUM" No results found for: "VALPROATE" No results found for: "CBMZ"  Current Medications: Current Outpatient Medications  Medication Sig Dispense Refill   hydrOXYzine (ATARAX) 10 MG tablet Take 1 tablet (10 mg total) by mouth 3 (three) times daily as needed. 90 tablet 3   buPROPion (WELLBUTRIN XL) 300 MG 24 hr tablet Take 1 tablet (300 mg total) by mouth every morning. 30 tablet 3   clobetasol cream (TEMOVATE) 0.05 % Apply topically to affected area twice daily for no longer than 2 weeks consistently, then as needed after then. Wash hands well after use. (Patient not taking: Reported on 10/12/2022) 45 g 0   divalproex (DEPAKOTE) 500 MG DR tablet Take 1 tablet (500 mg total) by mouth 3 (three) times daily. 90 tablet 3   lidocaine (XYLOCAINE) 2 % solution Use as directed 10 mLs in the mouth or throat every 4 (four) hours as needed for mouth pain. 100 mL 0   ondansetron (ZOFRAN) 4 MG tablet TAKE 1 TABLET BY MOUTH EVERY 8 HOURS AS NEEDED FOR NAUSEA FOR VOMITING (Patient not taking: Reported on 12/08/2022) 10 tablet 0   pantoprazole (PROTONIX) 40 MG tablet Take 1 tablet (40 mg total) by mouth 2 (two) times daily. 30 tablet 1   QUEtiapine (SEROQUEL) 50 MG tablet Take 1 tablet (50 mg total) by mouth at bedtime. 90 tablet 1   rosuvastatin (CRESTOR) 10 MG tablet Take 1 tablet (10 mg total) by mouth daily. 90 tablet 1   sucralfate (CARAFATE) 1 g tablet Take 1 tablet (1 g total) by mouth 4 (four) times daily. Take 10 mLs (1 g total) by mouth 4 (four) times daily - with meals and at bedtime. 120 tablet 0   No current facility-administered medications for this visit.     Musculoskeletal: Strength & Muscle Tone:  within normal limits and Telehealth visit Gait & Station:  Unable to assess due to telephone visit Patient leans: N/A  Psychiatric Specialty Exam: Review of Systems  There were no vitals taken for this visit.There is no height or weight on file to calculate BMI.  General Appearance: Well Groomed  Eye Contact:  Good  Speech:  Clear and Coherent and Normal Rate  Volume:  Normal  Mood:  Anxious  Affect:  Appropriate and Congruent  Thought Process:  Coherent, Goal Directed and Linear  Orientation:  Full (Time, Place, and Person)  Thought Content: WDL and Logical   Suicidal Thoughts:  Yes.  without intent/plan  Homicidal Thoughts:  No  Memory:  Immediate;   Good Recent;   Good Remote;   Good  Judgement:  Good  Insight:  Good  Psychomotor Activity:  Normal  Concentration:  Concentration: Good and Attention Span: Good  Recall:  Good  Fund of Knowledge: Good  Language: Good  Akathisia:  No  Handed:  Right  AIMS (if indicated): Not done  Assets:  Communication Skills Desire for Improvement Financial Resources/Insurance Housing Social Support  ADL's:  Intact  Cognition: WNL  Sleep:  Good   Screenings: GAD-7    Flowsheet Row Video Visit from 12/27/2022 in [facility] Video Visit from 09/27/2022 in [facility] Video Visit from 06/29/2022 in [facility] Video Visit from 04/06/2022 in [facility] Video Visit from 01/04/2022 in [facility]  Total GAD-7 Score 21 12 15 9 13       PHQ2-9    Flowsheet Row Video Visit from 12/27/2022 in [facility] Office Visit from 12/13/2022 in [facility] Office Visit from 11/08/2022 in [facility] Video Visit from 09/27/2022 in [facility] Video Visit from 06/29/2022 in [facility]  PHQ-2 Total Score 0 0 2 3 4   PHQ-9 Total Score 5 0 10 6 13       Flowsheet Row ED from 10/04/2022 in [facility] Emergency Department at [facility] Video Visit from 09/27/2022 in [facility] ED from 08/09/2022 in [facility])  C-SSRS RISK CATEGORY No Risk Error: Q7 should not be populated when Q6 is No Error: Q7 should not be populated when Q6 is No        Assessment and Plan: Patient endorses symptoms of anxiety. She notes that she weaned herself off of Wellbutrin and Depakote as she could not afford it. Today patient agreeable to start hydroxyzine 10 mg three times daily as needed. At this time Wellbutrin and Depakote not restarted. She will continue all other medications as prescribed.   1. Bipolar I disorder, most recent episode depressed (HCC)  Continue- QUEtiapine (SEROQUEL) 50 MG tablet; Take 1 tablet (50 mg total) by mouth at bedtime.  Dispense: 90 tablet; Refill: 1  2. GAD (generalized anxiety disorder)  Continue- QUEtiapine (SEROQUEL) 50 MG tablet; Take 1 tablet (50 mg total) by mouth at bedtime.  Dispense: 90 tablet; Refill: 1 Continue- hydrOXYzine (ATARAX) 10 MG tablet; Take 1 tablet (10 mg total) by mouth 3 (three) times daily as needed.  Dispense: 90 tablet; Refill: 3     Follow up in 3 months   Follow up with therapy   Shanna Cisco, NP 12/27/2022, 1:01 PM

## 2022-12-28 ENCOUNTER — Encounter: Payer: Self-pay | Admitting: Gastroenterology

## 2022-12-29 ENCOUNTER — Ambulatory Visit: Payer: Medicaid Other

## 2023-01-11 ENCOUNTER — Other Ambulatory Visit: Payer: Self-pay

## 2023-01-11 ENCOUNTER — Other Ambulatory Visit (HOSPITAL_COMMUNITY): Payer: Self-pay | Admitting: Psychiatry

## 2023-01-11 DIAGNOSIS — F411 Generalized anxiety disorder: Secondary | ICD-10-CM

## 2023-01-11 DIAGNOSIS — F3130 Bipolar disorder, current episode depressed, mild or moderate severity, unspecified: Secondary | ICD-10-CM

## 2023-01-11 MED ORDER — QUETIAPINE FUMARATE 50 MG PO TABS
50.0000 mg | ORAL_TABLET | Freq: Every day | ORAL | 1 refills | Status: DC
Start: 2023-01-11 — End: 2023-03-28
  Filled 2023-01-11: qty 90, 90d supply, fill #0

## 2023-01-22 ENCOUNTER — Other Ambulatory Visit: Payer: Self-pay

## 2023-01-22 ENCOUNTER — Ambulatory Visit: Payer: MEDICAID

## 2023-01-22 MED ORDER — ONDANSETRON HCL 4 MG PO TABS: 4.0000 mg | ORAL_TABLET | Freq: Three times a day (TID) | ORAL | 0 refills | Status: DC | PRN

## 2023-01-30 ENCOUNTER — Emergency Department (HOSPITAL_COMMUNITY): Payer: MEDICAID

## 2023-01-30 ENCOUNTER — Emergency Department (HOSPITAL_COMMUNITY)
Admission: EM | Admit: 2023-01-30 | Discharge: 2023-01-30 | Disposition: A | Discharge status: Home / Self Care | Payer: MEDICAID | Attending: Emergency Medicine | Admitting: Emergency Medicine

## 2023-01-30 ENCOUNTER — Other Ambulatory Visit: Payer: Self-pay

## 2023-01-30 ENCOUNTER — Encounter (HOSPITAL_COMMUNITY): Payer: Self-pay | Admitting: Emergency Medicine

## 2023-01-30 DIAGNOSIS — M546 Pain in thoracic spine: Secondary | ICD-10-CM | POA: Diagnosis not present

## 2023-01-30 DIAGNOSIS — I10 Essential (primary) hypertension: Secondary | ICD-10-CM | POA: Diagnosis not present

## 2023-01-30 DIAGNOSIS — R0602 Shortness of breath: Secondary | ICD-10-CM | POA: Insufficient documentation

## 2023-01-30 DIAGNOSIS — Z79899 Other long term (current) drug therapy: Secondary | ICD-10-CM | POA: Insufficient documentation

## 2023-01-30 DIAGNOSIS — M549 Dorsalgia, unspecified: Secondary | ICD-10-CM | POA: Diagnosis present

## 2023-01-30 LAB — BASIC METABOLIC PANEL
Anion gap: 7 (ref 5–15)
BUN: 7 mg/dL (ref 6–20)
CO2: 25 mmol/L (ref 22–32)
Calcium: 11 mg/dL — ABNORMAL HIGH (ref 8.9–10.3)
Chloride: 106 mmol/L (ref 98–111)
Creatinine, Ser: 0.67 mg/dL (ref 0.44–1.00)
GFR, Estimated: >60 mL/min (ref >60)
Glucose, Bld: 88 mg/dL (ref 70–99)
Potassium: 4 mmol/L (ref 3.5–5.1)
Sodium: 138 mmol/L (ref 135–145)

## 2023-01-30 LAB — RAPID URINE DRUG SCREEN, HOSP PERFORMED
Amphetamines: NOT DETECTED
Barbiturates: NOT DETECTED
Benzodiazepines: NOT DETECTED
Cocaine: NOT DETECTED
Opiates: NOT DETECTED
Tetrahydrocannabinol: POSITIVE — AB

## 2023-01-30 LAB — URINALYSIS, ROUTINE W REFLEX MICROSCOPIC
Bilirubin Urine: NEGATIVE
Glucose, UA: NEGATIVE mg/dL
Hgb urine dipstick: NEGATIVE
Ketones, ur: NEGATIVE mg/dL
Leukocytes,Ua: NEGATIVE
Nitrite: NEGATIVE
Protein, ur: NEGATIVE mg/dL
Specific Gravity, Urine: 1.02 (ref 1.005–1.030)
pH: 5 (ref 5.0–8.0)

## 2023-01-30 LAB — CBC
HCT: 41.5 % (ref 36.0–46.0)
Hemoglobin: 13.7 g/dL (ref 12.0–15.0)
MCH: 30.6 pg (ref 26.0–34.0)
MCHC: 33 g/dL (ref 30.0–36.0)
MCV: 92.8 fL (ref 80.0–100.0)
Platelets: 288 10*3/uL (ref 150–400)
RBC: 4.47 MIL/uL (ref 3.87–5.11)
RDW: 13.2 % (ref 11.5–15.5)
WBC: 10.3 10*3/uL (ref 4.0–10.5)
nRBC: 0 % (ref 0.0–0.2)

## 2023-01-30 LAB — SEDIMENTATION RATE: Sed Rate: 2 mm/h (ref 0–22)

## 2023-01-30 LAB — TROPONIN I (HIGH SENSITIVITY): Troponin I (High Sensitivity): <2 ng/L (ref <18)

## 2023-01-30 LAB — C-REACTIVE PROTEIN: CRP: <0.5 mg/dL (ref <1.0)

## 2023-01-30 LAB — D-DIMER, QUANTITATIVE: D-Dimer, Quant: <0.27 ug{FEU}/mL (ref 0.00–0.50)

## 2023-01-30 MED ORDER — CYCLOBENZAPRINE HCL 10 MG PO TABS
10.0000 mg | ORAL_TABLET | Freq: Once | ORAL | Status: AC
  Administered 2023-01-30: 10 mg via ORAL
  Filled 2023-01-30: qty 1

## 2023-01-30 MED ORDER — KETOROLAC TROMETHAMINE 15 MG/ML IJ SOLN
15.0000 mg | Freq: Once | INTRAMUSCULAR | Status: AC
  Administered 2023-01-30: 15 mg via INTRAMUSCULAR
  Filled 2023-01-30: qty 1

## 2023-01-30 MED ORDER — OXYCODONE-ACETAMINOPHEN 5-325 MG PO TABS
1.0000 | ORAL_TABLET | Freq: Once | ORAL | Status: AC
  Administered 2023-01-30: 1 via ORAL
  Filled 2023-01-30: qty 1

## 2023-01-30 MED ORDER — CYCLOBENZAPRINE HCL 10 MG PO TABS: 10.0000 mg | ORAL_TABLET | Freq: Two times a day (BID) | ORAL | 0 refills | Status: DC | PRN

## 2023-01-30 NOTE — Discharge Instructions (Signed)
You were seen for your back pain in the emergency department.   At home, please take Tylenol, ibuprofen, and over-the-counter lidocaine patches for your pain.  Please also take the Flexeril that we have prescribed you for any breakthrough pain but do not take this before driving because it can make you drowsy.    Check your MyChart online for the results of any tests that had not resulted by the time you left the emergency department.   Follow-up with your primary doctor in 2-3 days regarding your visit.    Return immediately to the emergency department if you experience any of the following: Worsening pain, numbness or weakness of your arms or legs, bowel or bladder incontinence, or any other concerning symptoms.    Thank you for visiting our Emergency Department. It was a pleasure taking care of you today.

## 2023-01-30 NOTE — ED Provider Notes (Signed)
[place] EMERGENCY DEPARTMENT AT [facility] Provider Note   CSN: [id number] Arrival date & time: 01/30/23  4098     History {Add pertinent medical, surgical, social history, OB history to HPI:1} Chief Complaint  Patient presents with   Back Pain    Jolisha Mandrell is a 57 y.o. female.  57 year old female with a history of polysubstance abuse (prior IV drug use), ethanol use, hypertension, esophageal stricture status post dilation on 12/08/22, and psychiatric disease who presents to the emergency department with back pain.  Patient reports that yesterday afternoon she started having right back pain.  Says that it radiates up her chest.  Has some mild shortness of breath associated with it.  No fevers, bowel or bladder incontinence, numbness or weakness of her arms or legs, or trauma recently.  Says she has not used IV drugs in over a decade.  Received Percocet for which she reports has helped.       Home Medications Prior to Admission medications   Medication Sig Start Date End Date Taking? Authorizing Provider  buPROPion (WELLBUTRIN XL) 300 MG 24 hr tablet Take 1 tablet (300 mg total) by mouth every morning. 09/27/22   Shanna Cisco, NP  clobetasol cream (TEMOVATE) 0.05 % Apply topically to affected area twice daily for no longer than 2 weeks consistently, then as needed after then. Wash hands well after use. Patient not taking: Reported on 10/12/2022 01/21/22   Allwardt, Crist Infante, PA-C  divalproex (DEPAKOTE) 500 MG DR tablet Take 1 tablet (500 mg total) by mouth 3 (three) times daily. 09/27/22   Shanna Cisco, NP  hydrOXYzine (ATARAX) 10 MG tablet Take 1 tablet (10 mg total) by mouth 3 (three) times daily as needed. 12/27/22   Shanna Cisco, NP  lidocaine (XYLOCAINE) 2 % solution Use as directed 10 mLs in the mouth or throat every 4 (four) hours as needed for mouth pain. 12/08/22   Napoleon Form, MD  ondansetron (ZOFRAN) 4 MG tablet Take 1 tablet (4 mg  total) by mouth every 8 (eight) hours as needed for nausea or vomiting. 01/22/23   Modena Slater, DO  pantoprazole (PROTONIX) 40 MG tablet Take 1 tablet (40 mg total) by mouth 2 (two) times daily. 12/13/22 12/13/23  Tawkaliyar, Roya, DO  QUEtiapine (SEROQUEL) 50 MG tablet Take 1 tablet (50 mg total) by mouth at bedtime. 12/27/22   Shanna Cisco, NP  QUEtiapine (SEROQUEL) 50 MG tablet Take 1 tablet (50 mg total) by mouth at bedtime. 01/11/23   Shanna Cisco, NP  rosuvastatin (CRESTOR) 10 MG tablet Take 1 tablet (10 mg total) by mouth daily. 11/10/22 11/10/23  Modena Slater, DO  sucralfate (CARAFATE) 1 g tablet Take 1 tablet (1 g total) by mouth 4 (four) times daily. Take 10 mLs (1 g total) by mouth 4 (four) times daily - with meals and at bedtime. 12/13/22 01/12/23  Tawkaliyar, Roya, DO  fluticasone-salmeterol (ADVAIR HFA) 230-21 MCG/ACT inhaler Inhale 2 puffs into the lungs 2 (two) times daily. Rinse mouth after each use Patient not taking: Reported on 02/14/2017 07/04/16 07/25/19  Nche, Bonna Gains, NP      Allergies    Patient has no known allergies.    Review of Systems   Review of Systems  Physical Exam Updated Vital Signs BP 118/71 (BP Location: Left Arm)   Pulse (!) 56   Temp 98.3 F (36.8 C) (Oral)   Resp 20   LMP  (LMP Unknown)   SpO2 97%  Physical Exam Vitals and nursing note reviewed.  Constitutional:      General: She is not in acute distress.    Appearance: She is well-developed.  HENT:     Head: Normocephalic and atraumatic.     Right Ear: External ear normal.     Left Ear: External ear normal.     Nose: Nose normal.  Eyes:     Extraocular Movements: Extraocular movements intact.     Conjunctiva/sclera: Conjunctivae normal.     Pupils: Pupils are equal, round, and reactive to light.  Cardiovascular:     Rate and Rhythm: Normal rate and regular rhythm.     Heart sounds: No murmur heard. Pulmonary:     Effort: Pulmonary effort is normal. No respiratory distress.      Breath sounds: Normal breath sounds.     Comments: No rashes over chest wall Abdominal:     General: Abdomen is flat. There is no distension.     Palpations: Abdomen is soft. There is no mass.     Tenderness: There is no abdominal tenderness. There is no guarding.  Musculoskeletal:     Cervical back: Normal range of motion and neck supple.     Right lower leg: No edema.     Left lower leg: No edema.     Comments: No spinal midline TTP in cervical, thoracic, or lumbar spine.  Right paraspinal tenderness to palpation at approximately T3 and T4 level.  No stepoffs noted.   Motor: Muscle bulk and tone are normal. Strength is 5/5 in hip flexion, knee flexion and extension, ankle dorsiflexion and plantar flexion bilaterally. Full strength of great toe dorsiflexion bilaterally.  Sensory: Intact sensation to light touch in L2 though S1 dermatomes bilaterally.   Skin:    General: Skin is warm and dry.  Neurological:     Mental Status: She is alert and oriented to person, place, and time. Mental status is at baseline.  Psychiatric:        Mood and Affect: Mood normal.     ED Results / Procedures / Treatments   Labs (all labs ordered are listed, but only abnormal results are displayed) Labs Reviewed  BASIC METABOLIC PANEL - Abnormal; Notable for the following components:      Result Value   Calcium 11.0 (*)    All other components within normal limits  CBC  SEDIMENTATION RATE  C-REACTIVE PROTEIN  URINALYSIS, ROUTINE W REFLEX MICROSCOPIC  RAPID URINE DRUG SCREEN, HOSP PERFORMED  TROPONIN I (HIGH SENSITIVITY)    EKG EKG Interpretation Date/Time:  Tuesday January 30 2023 10:24:36 EST Ventricular Rate:  58 PR Interval:  164 QRS Duration:  84 QT Interval:  412 QTC Calculation: 404 R Axis:   75  Text Interpretation: Sinus bradycardia Otherwise normal ECG Confirmed by Vonita Moss [phone]) on 01/30/2023 1:58:40 PM  Radiology No results found.  Procedures Procedures   {Document cardiac monitor, telemetry assessment procedure when appropriate:1}  Medications Ordered in ED Medications  oxyCODONE-acetaminophen (PERCOCET/ROXICET) 5-325 MG per tablet 1 tablet (1 tablet Oral Given 01/30/23 1320)    ED Course/ Medical Decision Making/ A&P   {   Click here for ABCD2, HEART and other calculatorsREFRESH Note before signing :1}                              Medical Decision Making Amount and/or Complexity of Data Reviewed Labs: ordered. Radiology: ordered.  Risk Prescription drug management.   ***  {  Document critical care time when appropriate:1} {Document review of labs and clinical decision tools ie heart score, Chads2Vasc2 etc:1}  {Document your independent review of radiology images, and any outside records:1} {Document your discussion with family members, caretakers, and with consultants:1} {Document social determinants of health affecting pt's care:1} {Document your decision making why or why not admission, treatments were needed:1} Final Clinical Impression(s) / ED Diagnoses Final diagnoses:  None    Rx / DC Orders ED Discharge Orders     None

## 2023-01-30 NOTE — ED Notes (Signed)
Pt d/c home per EDP order, discharge summary reviewed, verbalizes understanding. Reports she is not driving self home and has ride.

## 2023-01-30 NOTE — ED Triage Notes (Signed)
Pt from home with reports of mid right side back pain that wraps around to her chest. Pt reports the pain is worse with deep breathing. Pt took ASA at home PTA.

## 2023-02-01 ENCOUNTER — Other Ambulatory Visit: Payer: Self-pay

## 2023-02-01 ENCOUNTER — Other Ambulatory Visit: Payer: Self-pay | Admitting: Student

## 2023-02-01 MED ORDER — ONDANSETRON HCL 4 MG PO TABS: 4.0000 mg | ORAL_TABLET | Freq: Three times a day (TID) | ORAL | 0 refills | Status: DC | PRN

## 2023-02-01 NOTE — Telephone Encounter (Signed)
Next appt scheduled 03/19/23 with Dr Merrilee Jansky.

## 2023-02-02 ENCOUNTER — Telehealth (HOSPITAL_COMMUNITY): Payer: Self-pay

## 2023-02-02 NOTE — Telephone Encounter (Signed)
Medication management - Prior authorization for patient's prescribed Quetiapine 50 mg, #90 for 90 days initiated online with CoverMyMeds and sent to patient's [facility]. Review pending for approval.

## 2023-02-05 NOTE — Telephone Encounter (Signed)
Thank you for the update!

## 2023-02-15 ENCOUNTER — Ambulatory Visit: Payer: MEDICAID

## 2023-02-21 ENCOUNTER — Ambulatory Visit (INDEPENDENT_AMBULATORY_CARE_PROVIDER_SITE_OTHER): Payer: MEDICAID | Admitting: Endocrinology

## 2023-02-21 ENCOUNTER — Encounter: Payer: Self-pay | Admitting: Endocrinology

## 2023-02-21 VITALS — BP 118/70 | HR 74 | Resp 20 | Ht 64.0 in | Wt 142.2 lb

## 2023-02-21 DIAGNOSIS — E559 Vitamin D deficiency, unspecified: Secondary | ICD-10-CM | POA: Diagnosis not present

## 2023-02-21 DIAGNOSIS — E210 Primary hyperparathyroidism: Secondary | ICD-10-CM | POA: Diagnosis not present

## 2023-02-21 DIAGNOSIS — M8589 Other specified disorders of bone density and structure, multiple sites: Secondary | ICD-10-CM

## 2023-02-21 NOTE — Progress Notes (Signed)
Outpatient Endocrinology Note [place] Gavyn Zoss, MD  02/21/23  Patient's Name: Mary [name]    DOB: [birth year]    MRN: [id number]  REASON OF VISIT: Follow-up for hypercalcemia/ primary hyperparathyroidism / possible parathyroid adenoma  REFERRING PROVIDER: Modena Slater, DO  PCP: Modena Slater, DO  HISTORY OF PRESENT ILLNESS:   Mary [name] is a 57 y.o. old female with past medical history listed below, is here for follow-up of hypercalcemia / primary hyperparathyroidism /possible parathyroid adenoma.    Pertinent Calcium History: - Patient had CT chest, abdomen and pelvis with contrast on October 04, 2022 showed left thoracic inlet paraesophageal overall 14 mm heterogeneously enhancing soft tissue nodule which was nonenhancing on the early contrast.  CTA in 2018, suspicious for parathyroid adenoma.  Due to concern of primary hyperparathyroidism patient was referred to endocrinology for further evaluation and management, was initially seen in 11/2022.  -In 2018 patient had elevated PTH of 122 with upper normal limit of 65 however at that time she had vitamin D deficiency with vitamin D level 16.  She had normal serum calcium at that time.  Patient had intermittent mild hypercalcemia at least from 2014 in the range of 10.4-11.0 and other times with normal serum calcium.  Repeat PTH in July 2024 was normal 61 with serum calcium of 10.3.   -No history of kidney stone.  She has not been on medication that increase serum calcium including hydrochlorothiazide and lithium.  No history of fragility fracture.  Diagnosis of osteopenia, she had DEXA scan in March 2018 consistent with osteopenia with lumbar spine T-score of -1.7, T-score -1.8 in distal forearm and T-score -1.7 in left and right femoral neck. No family history of kidney stone, hyperparathyroidism or endocrine neoplasm. -Patient drinks milk almost daily. She has not been taking any multivitamin, calcium or vitamin D supplement. -Patient was evaluated  for primary hyperparathyroidism/hypercalcemia in 2018 by Dr. Lucianne Muss in this endocrinology clinic and planned for monitoring.  - CT Scan in 10/04/2022 - Mediastinum/Nodes: Left thoracic inlet, paraesophageal oval 14 mm heterogeneously enhancing soft tissue nodule which was nonenhancing on the early contrast phase CTA in 2018. This has morphology suspicious for parathyroid adenoma  # She had ultrasound thyroid in November 15, 2022 no discrete thyroid nodules however showed uniformly hypoechoic soft tissue inferior and deep to the lower left thyroid measuring 1.6 x 1.2 x 1.0 cm, could represent parathyroid adenoma/gland.  - In 11/2022: laboratory evaluation serum calcium elevated 11.1 and PTH elevated 115 with upper normal limit of 77, normal phosphorus and normal renal function.  Normal magnesium.  Overall consistent with primary hyperparathyroidism.  Patient has vitamin D level 18 is low, however in the context of hypercalcemia, no vitamin D supplement at this time.  As vitamin D is low has some component of secondary hyperparathyroidism, other labs primarily consistent of primary hyperparathyroidism.   Interval history  Patient has not completed parathyroid nuclear scan, she was not also in September 30.  I reordered.  Patient has not been calcium and vitamin D supplement.  Recent serum calcium on November 19 was 11.  Normal renal function.  She has no other complaints today.  REVIEW OF SYSTEMS:  As per history of present illness.   PAST MEDICAL HISTORY: Past Medical History:  Diagnosis Date   Bipolar depression (HCC)    Borderline personality disorder (HCC)    COPD (chronic obstructive pulmonary disease) (HCC)    Depression    GERD (gastroesophageal reflux disease)    Hiatal hernia  History of alcohol abuse    per pt none since 10/ 2014   History of attempted suicide    History of drug abuse in remission (HCC)    per pt in remission since 12-26-2012  polysubstance dependence (alcohol,  crack, cocaine, 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   History of hepatitis C per pt first dx 2009 (approx)   consulted w/ dr comer (infectious disease) note 04-26-2016 , pt states was called and told per lab work no longer has hepatitis c    History of kidney stones    Hypercalcemia    mild and long-standing--- pt asymptomatic   Hyperlipidemia    Nausea and vomiting 07/04/2011   Primary hyperparathyroidism [facility])    endocrinology --  dr Lucianne Muss   Pruritic erythematous rash    lower legs   PTSD (post-traumatic stress disorder)    Right ureteral stone    Schizophrenia (HCC)    Substance abuse (HCC)    Vitamin D deficiency     PAST SURGICAL HISTORY: Past Surgical History:  Procedure Laterality Date   COLONOSCOPY  12/08/2022   at [facility] with Scherry Ran at same time as egd   CYSTOSCOPY N/A 01/10/2017   Procedure: CYSTOSCOPY;  Surgeon: Hermina Staggers, MD;  Location: [facility];  Service: Gynecology;  Laterality: N/A;   CYSTOSCOPY W/ URETERAL STENT PLACEMENT Right 07/14/2016   Procedure: CYSTOSCOPY WITH STENT REPLACEMENT;  Surgeon: Malen Gauze, MD;  Location: [facility];  Service: Urology;  Laterality: Right;   CYSTOSCOPY WITH RETROGRADE PYELOGRAM, URETEROSCOPY AND STENT PLACEMENT Right 06/28/2016   Procedure: CYSTOSCOPY WITH RETROGRADE PYELOGRAM, URETEROSCOPY AND STENT PLACEMENT;  Surgeon: Malen Gauze, MD;  Location: [facility];  Service: Urology;  Laterality: Right;   CYSTOSCOPY/RETROGRADE/URETEROSCOPY/STONE EXTRACTION WITH BASKET Right 07/14/2016   Procedure: CYSTOSCOPY/RETROGRADE/URETEROSCOPY/STONE EXTRACTION WITH BASKET;  Surgeon: Malen Gauze, MD;  Location: [facility];  Service: Urology;  Laterality: Right;   DIAGNOSTIC LAPAROSCOPIC LIVER BIOPSY N/A 02/10/2013   Procedure: DIAGNOSTIC LAPAROSCOPIC ;  Surgeon: Ardeth Sportsman, MD;  Location: [facility];  Service: General;  Laterality: N/A;  DIAGNOSTIC LAPAROSCOPY,laparoscopic ventral  hernia repair with mesh lysis of adhesions   HERNIA REPAIR     HOLMIUM LASER APPLICATION Right 07/14/2016   Procedure: HOLMIUM LASER APPLICATION;  Surgeon: Malen Gauze, MD;  Location: [facility];  Service: Urology;  Laterality: Right;   SPLENECTOMY  2004   TUBAL LIGATION     UPPER GASTROINTESTINAL ENDOSCOPY  12/08/2022   with colonoscopy at [facility] , Veena Nandigam, Hiatal Hernia, esophageal stricture, dilation,   VAGINAL HYSTERECTOMY N/A 01/10/2017   Procedure: HYSTERECTOMY VAGINAL W/ BILATERAL SALPINGECTOMY WITH REPAIR OF INCIDENTAL CYSTOTOMY;  Surgeon: Hermina Staggers, MD;  Location: [facility];  Service: Gynecology;  Laterality: N/A;    ALLERGIES: No Known Allergies  FAMILY HISTORY:  Family History  Problem Relation Age of Onset   Alcohol abuse Mother    Alcohol abuse Father    Alcohol abuse Sister    Colon cancer Maternal Grandmother        colon   Diabetes Maternal Grandmother    Hypertension Maternal Grandmother    Thyroid disease Neg Hx    Stomach cancer Neg Hx    Esophageal cancer Neg Hx    Rectal cancer Neg Hx    No known family history of hypercalcemia, kidney stone, hyperparathyroidism, or endocrine neoplasms.   SOCIAL HISTORY: Social History   Socioeconomic History   Marital status: Single  Spouse name: Not on file   Number of children: 5   Years of education: Not on file   Highest education level: Not on file  Occupational History   Not on file  Tobacco Use   Smoking status: Every Day    Current packs/day: 1.00    Average packs/day: 1 pack/day for 32.0 years (32.0 ttl pk-yrs)    Types: Cigarettes   Smokeless tobacco: Never  Vaping Use   Vaping status: Never Used  Substance and Sexual Activity   Alcohol use: Not Currently    Comment: quit 12/2012   Drug use: Not Currently    Types: "Crack" cocaine, Marijuana, Cocaine    Comment: per pt quit 12-26-2012 marijuana 2 times a week   Sexual activity: Yes    Birth control/protection:  Surgical, Post-menopausal  Other Topics Concern   Not on file  Social History Narrative   Not on file   Social Determinants of Health   Financial Resource Strain: Not on file  Food Insecurity: Not on file  Transportation Needs: Not on file  Physical Activity: Not on file  Stress: Not on file  Social Connections: Not on file    MEDICATIONS:  Current Outpatient Medications  Medication Sig Dispense Refill   buPROPion (WELLBUTRIN XL) 300 MG 24 hr tablet Take 1 tablet (300 mg total) by mouth every morning. 30 tablet 3   clobetasol cream (TEMOVATE) 0.05 % Apply topically to affected area twice daily for no longer than 2 weeks consistently, then as needed after then. Wash hands well after use. 45 g 0   cyclobenzaprine (FLEXERIL) 10 MG tablet Take 1 tablet (10 mg total) by mouth 2 (two) times daily as needed for muscle spasms. 20 tablet 0   divalproex (DEPAKOTE) 500 MG DR tablet Take 1 tablet (500 mg total) by mouth 3 (three) times daily. 90 tablet 3   hydrOXYzine (ATARAX) 10 MG tablet Take 1 tablet (10 mg total) by mouth 3 (three) times daily as needed. 90 tablet 3   lidocaine (XYLOCAINE) 2 % solution Use as directed 10 mLs in the mouth or throat every 4 (four) hours as needed for mouth pain. 100 mL 0   ondansetron (ZOFRAN) 4 MG tablet Take 1 tablet (4 mg total) by mouth every 8 (eight) hours as needed for nausea or vomiting. 10 tablet 0   pantoprazole (PROTONIX) 40 MG tablet Take 1 tablet (40 mg total) by mouth 2 (two) times daily. 30 tablet 1   QUEtiapine (SEROQUEL) 50 MG tablet Take 1 tablet (50 mg total) by mouth at bedtime. 90 tablet 1   QUEtiapine (SEROQUEL) 50 MG tablet Take 1 tablet (50 mg total) by mouth at bedtime. 90 tablet 1   rosuvastatin (CRESTOR) 10 MG tablet Take 1 tablet (10 mg total) by mouth daily. 90 tablet 1   sucralfate (CARAFATE) 1 g tablet Take 1 tablet (1 g total) by mouth 4 (four) times daily. Take 10 mLs (1 g total) by mouth 4 (four) times daily - with meals and at  bedtime. 120 tablet 0   No current facility-administered medications for this visit.    PHYSICAL EXAM: Vitals:   02/21/23 1533  BP: 118/70  Pulse: 74  Resp: 20  SpO2: 99%  Weight: 142 lb 3.2 oz (64.5 kg)  Height: 5\' 4"  (1.626 m)    Body mass index is 24.41 kg/m.   General: Well developed, well nourished female in no apparent distress.  HEENT: AT/[place], no external lesions. Hearing intact to the spoken  word Eyes: EOMI. Conjunctiva clear and no icterus. Neck: Trachea midline, neck supple without appreciable thyromegaly or lymphadenopathy and no palpable thyroid nodules Lungs: Clear to auscultation, no wheeze. Respirations not labored Heart: S1S2, Regular in rate and rhythm.  Abdomen: Soft, non tender, non distended Neurologic: Alert, oriented, normal speech, deep tendon biceps reflexes normal,  no gross focal neurological deficit Extremities: No pedal pitting edema, no tremors of outstretched hands Skin: Warm, color good.  Psychiatric: Does not appear depressed or anxious  PERTINENT HISTORIC LABORATORY AND IMAGING STUDIES:  All pertinent laboratory results were reviewed. Please see HPI also for further details.   ASSESSMENT / PLAN  1. Primary hyperparathyroidism (HCC)   2. Osteopenia of multiple sites   3. Vitamin D deficiency     -Patient has intermittent hypercalcemia at least since 2014, calcium in the range of 10.4-11.0, although time mostly normal serum calcium.  She had elevated PTH of 122 with upper normal limit of 65 in 2018 however at that time she had vitamin D deficiency with vitamin D level of 16.  PTH was normal 61 in July 2024. -She had ultrasound neck in September 2024 and CT scan in July 2024 and 2018 : Showing hypoechoic soft tissue mass inferior and deep to the left thyroid lobe measuring 1.6 cm. -She has history of osteopenia.  No history of kidney stone.  No fragility fracture.  She has a stable and acceptable renal function. -Overall biochemically patient  has primary hyperparathyroidism with parathyroid adenoma (based on CT finding /ultrasound finding?.  Plan: -I would like to check nuclear sestamibi parathyroid scan.  Reordered. -Discussed that definitive treatment for primary hyperparathyroidism is parathyroidectomy/parathyroid surgery. -Advised not to take calcium and vitamin D supplement at this time.  It is okay to have mildly low vitamin D in the context of hypercalcemia.  Patient likes to wait a few weeks to complete test as she has new job.   Diagnoses and all orders for this visit:  Primary hyperparathyroidism (HCC) -     NM Parathyroid W/Spect/CT; Future  Osteopenia of multiple sites  Vitamin D deficiency     DISPOSITION Follow up in clinic in 4 month suggested.  All questions answered and patient verbalized understanding of the plan.  [place] Staria Birkhead, MD [facility] [address] [place], [address] [place], [place] [address] Phone # [phone]  At least part of this note was generated using voice recognition software. Inadvertent word errors may have occurred, which were not recognized during the proofreading process.

## 2023-02-21 NOTE — Patient Instructions (Signed)
Will plan for nuclear scan for parathyroid gland due to high calcium in blood.   Nuclear medicine will call you to schedule.

## 2023-03-02 ENCOUNTER — Ambulatory Visit
Admission: RE | Admit: 2023-03-02 | Discharge: 2023-03-02 | Disposition: A | Discharge status: Home / Self Care | Payer: MEDICAID | Source: Ambulatory Visit | Attending: Internal Medicine | Admitting: Internal Medicine

## 2023-03-02 DIAGNOSIS — Z1231 Encounter for screening mammogram for malignant neoplasm of breast: Secondary | ICD-10-CM

## 2023-03-19 ENCOUNTER — Other Ambulatory Visit: Payer: Self-pay

## 2023-03-19 ENCOUNTER — Encounter: Payer: MEDICAID | Admitting: Internal Medicine

## 2023-03-19 MED ORDER — ONDANSETRON HCL 4 MG PO TABS: 4.0000 mg | ORAL_TABLET | Freq: Three times a day (TID) | ORAL | 0 refills | Status: DC | PRN

## 2023-03-19 NOTE — Progress Notes (Deleted)
 N/V Patient started on carafate , protonix  at last OV; she was prescribed them sooner, but was unable to afford them at time of prescription. She had upper endoscopy and colonoscopy with GI 11/2022 and underwent esophageal dilation.  Plan:  1* hyperpara Follows with endocrinology, last visit 02/2023. There are plans for a nuclear sestamibi parathyroid  scan, and she was advised against taking calcium  and vitamin D  supplementation. Plan:Per endocrinology.  Chronic HCV Does not have it per telephone note Dr. Efrain 04/2016.  Bipolar 1 OP regimen is bupropion , depakote , hydroxyzine , quetiapine . She does follow with psychiatry OP.*** Plan:  Hld OP regimen is rosuvastatin  10 mg daily which she is*** compliant with. Lipid panel last checked 10/2022 with LDL above goal, 118.  Plan:Lipid panel today.

## 2023-03-20 ENCOUNTER — Telehealth (HOSPITAL_COMMUNITY): Payer: No Payment, Other | Admitting: Psychiatry

## 2023-03-20 ENCOUNTER — Encounter (HOSPITAL_COMMUNITY): Payer: Self-pay | Admitting: Psychiatry

## 2023-03-20 DIAGNOSIS — F3130 Bipolar disorder, current episode depressed, mild or moderate severity, unspecified: Secondary | ICD-10-CM

## 2023-03-20 DIAGNOSIS — F17200 Nicotine dependence, unspecified, uncomplicated: Secondary | ICD-10-CM | POA: Diagnosis not present

## 2023-03-20 DIAGNOSIS — F411 Generalized anxiety disorder: Secondary | ICD-10-CM | POA: Diagnosis not present

## 2023-03-20 MED ORDER — DIVALPROEX SODIUM 500 MG PO DR TAB: 500.0000 mg | DELAYED_RELEASE_TABLET | Freq: Three times a day (TID) | ORAL | 3 refills | Status: DC

## 2023-03-20 MED ORDER — QUETIAPINE FUMARATE 100 MG PO TABS: 100.0000 mg | ORAL_TABLET | Freq: Every day | ORAL | 3 refills | Status: DC

## 2023-03-20 MED ORDER — HYDROXYZINE HCL 10 MG PO TABS: 10.0000 mg | ORAL_TABLET | Freq: Three times a day (TID) | ORAL | 3 refills | Status: DC | PRN

## 2023-03-20 MED ORDER — BUPROPION HCL ER (XL) 300 MG PO TB24: 300.0000 mg | ORAL_TABLET | Freq: Every morning | ORAL | 3 refills | Status: DC

## 2023-03-20 NOTE — Progress Notes (Signed)
 BH MD/PA/NP OP Progress Note Virtual Visit via Video Note  I connected with Mary [name] on 03/20/23 at  2:30 PM EST by a video enabled telemedicine application and verified that I am speaking with the correct person using two identifiers.  Location: Patient: Home Provider: Clinic   I discussed the limitations of evaluation and management by telemedicine and the availability of in person appointments. The patient expressed understanding and agreed to proceed.  I provided 30 minutes of non-face-to-face time during this encounter.       03/20/2023 3:31 PM Mary [name]  MRN:  [id number]  Chief Complaint:   I need to restart Wellbutrin  and Depakote   HPI: 58 year old female seen today for follow up psychiatric evaluation. 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 She is currently being managed on hydroxyzine  10 mg three times daily as needed, Seroquel  50 mg nightly.  She informed clinical research associate that her medications are somewhat effective in managing her psychiatric conditions.  Today she was well groomed, pleasant, cooperative, and engaged in conversation. She informed clinical research associate that since weaning off of her Wellbutrin  and Depakote  she has been more irritable, fluctuations in mood, distracted, and having racing thoughts. She also notes that recently her son, his child, and his girlfriend has moved in with her. Currently her son is unemployed and notes that he and his girlfriend are looking for an apartment. She was working at [employer] but notes that she quit because she got a new position at [employer]. Patient notes that this position is now on hold because of a pending background check. She reports that she is nervous about not having a job. Financially she notes that she is doing well and notes that  friends help out when needed.   Patient notes that her Seroquel  no longer is not as effective as it once was. She reports that the she is sleeping 6-7 hour. Since her last visit her anxiety and depression has somewhat improved. Today provider conducted a GAD 7 and patient scored a 19, at her last visit she scored a 21. Provider also conducted a PHQ-9 and patient scored an 8, at her last visit she scored a 5. She notes that her appetite has improved. She reports that she has gained 11 pounds since having Surgair.  Today she denies SI/HI/VAH, mania or paranoia.   Today patient agreeable increasing Seroquel  50 mg to 100 mg to help manage mood and sleep. She will restart Wellbutrin  300 mg to help manage depression and Depakote  500 three times daily to help manage mood.  She will continue all other medications as prescribed. No other concerns at this time.   Visit Diagnosis:    ICD-10-CM   1. Bipolar I disorder, most recent episode depressed (HCC)  F31.30 buPROPion  (WELLBUTRIN  XL) 300 MG 24 hr tablet    divalproex  (DEPAKOTE ) 500 MG Mary tablet    QUEtiapine  (SEROQUEL ) 100 MG tablet    2. Tobacco dependence  F17.200 buPROPion  (WELLBUTRIN  XL) 300 MG 24 hr tablet    3. GAD (generalized anxiety disorder)  F41.1 hydrOXYzine  (ATARAX ) 10 MG tablet    QUEtiapine  (SEROQUEL ) 100 MG tablet         Past Psychiatric History: cocaine dependence (in remission), marijuana dependence (in remission), borderline, schizophrenia substance induced mood disorder, PTSD, and poly substance use  Past Medical History:  Past Medical  History:  Diagnosis Date   Bipolar depression (HCC)    Borderline personality disorder (HCC)    COPD (chronic obstructive pulmonary disease) (HCC)    Depression    GERD (gastroesophageal reflux disease)    Hiatal hernia    History of alcohol abuse    per pt none since 10/ 2014   History of attempted suicide    History of drug abuse in remission (HCC)    per pt in remission since  12-26-2012  polysubstance dependence (alcohol, crack, cocaine, 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   History of hepatitis C per pt first dx 2009 (approx)   consulted w/ Mary Mary [name] (infectious disease) note 04-26-2016 , pt states was called and told per lab work no longer has hepatitis c    History of kidney stones    Hypercalcemia    mild and long-standing--- pt asymptomatic   Hyperlipidemia    Nausea and vomiting 07/04/2011   Primary hyperparathyroidism [facility])    endocrinology --  Mary [name]   Pruritic erythematous rash    lower legs   PTSD (post-traumatic stress disorder)    Right ureteral stone    Schizophrenia (HCC)    Substance abuse (HCC)    Vitamin D  deficiency     Past Surgical History:  Procedure Laterality Date   COLONOSCOPY  12/08/2022   at [facility] with Mary [name] at same time as egd   CYSTOSCOPY N/A 01/10/2017   Procedure: CYSTOSCOPY;  Surgeon: Mary Ozell CROME, MD;  Location: [facility];  Service: Gynecology;  Laterality: N/A;   CYSTOSCOPY W/ URETERAL STENT PLACEMENT Right 07/14/2016   Procedure: CYSTOSCOPY WITH STENT REPLACEMENT;  Surgeon: Mary Belvie CROME, MD;  Location: [facility];  Service: Urology;  Laterality: Right;   CYSTOSCOPY WITH RETROGRADE PYELOGRAM, URETEROSCOPY AND STENT PLACEMENT Right 06/28/2016   Procedure: CYSTOSCOPY WITH RETROGRADE PYELOGRAM, URETEROSCOPY AND STENT PLACEMENT;  Surgeon: Belvie [name] Sherrilee, MD;  Location: [facility];  Service: Urology;  Laterality: Right;   CYSTOSCOPY/RETROGRADE/URETEROSCOPY/STONE EXTRACTION WITH BASKET Right 07/14/2016   Procedure: CYSTOSCOPY/RETROGRADE/URETEROSCOPY/STONE EXTRACTION WITH BASKET;  Surgeon: Mary Belvie CROME, MD;  Location: [facility];  Service: Urology;  Laterality: Right;   DIAGNOSTIC LAPAROSCOPIC LIVER BIOPSY N/A 02/10/2013   Procedure: DIAGNOSTIC LAPAROSCOPIC ;  Surgeon: Elspeth KYM Schultze, MD;  Location: [facility];  Service: General;  Laterality: N/A;  DIAGNOSTIC  LAPAROSCOPY,laparoscopic ventral hernia repair with mesh lysis of adhesions   HERNIA REPAIR     HOLMIUM LASER APPLICATION Right 07/14/2016   Procedure: HOLMIUM LASER APPLICATION;  Surgeon: Mary Belvie CROME, MD;  Location: [facility];  Service: Urology;  Laterality: Right;   SPLENECTOMY  2004   TUBAL LIGATION     UPPER GASTROINTESTINAL ENDOSCOPY  12/08/2022   with colonoscopy at [facility] , Mary [name], Hiatal Hernia, esophageal stricture, dilation,   VAGINAL HYSTERECTOMY N/A 01/10/2017   Procedure: HYSTERECTOMY VAGINAL W/ BILATERAL SALPINGECTOMY WITH REPAIR OF INCIDENTAL CYSTOTOMY;  Surgeon: Mary Ozell CROME, MD;  Location: [facility];  Service: Gynecology;  Laterality: N/A;    Family Psychiatric History: Mother heroin use (over dosed and died), sister alcoholic, sister cocaine abuse, and maternal grandmother alcohol use. Five children were born addicted to cocaine.  Family History:  Family History  Problem Relation Age of Onset   Alcohol abuse Mother    Alcohol abuse Father    Alcohol abuse Sister    Colon cancer Maternal Grandmother        colon   Diabetes Maternal Grandmother  Hypertension Maternal Grandmother    Thyroid  disease Neg Hx    Stomach cancer Neg Hx    Esophageal cancer Neg Hx    Rectal cancer Neg Hx     Social History:  Social History   Socioeconomic History   Marital status: Single    Spouse name: Not on file   Number of children: 5   Years of education: Not on file   Highest education level: Not on file  Occupational History   Not on file  Tobacco Use   Smoking status: Every Day    Current packs/day: 1.00    Average packs/day: 1 pack/day for 32.0 years (32.0 ttl pk-yrs)    Types: Cigarettes   Smokeless tobacco: Never  Vaping Use   Vaping status: Never Used  Substance and Sexual Activity   Alcohol use: Not Currently    Comment: quit 12/2012   Drug use: Not Currently    Types: Crack cocaine, Marijuana, Cocaine    Comment: per pt quit  12-26-2012 marijuana 2 times a week   Sexual activity: Yes    Birth control/protection: Surgical, Post-menopausal  Other Topics Concern   Not on file  Social History Narrative   Not on file   Social Drivers of Health   Financial Resource Strain: Not on file  Food Insecurity: Not on file  Transportation Needs: Not on file  Physical Activity: Not on file  Stress: Not on file  Social Connections: Not on file    Allergies: No Known Allergies  Metabolic Disorder Labs: Lab Results  Component Value Date   HGBA1C 5.5 11/08/2022   No results found for: PROLACTIN Lab Results  Component Value Date   CHOL 201 (H) 11/08/2022   TRIG 57 11/08/2022   HDL 73 11/08/2022   CHOLHDL 2.8 11/08/2022   LDLCALC 118 (H) 11/08/2022   Lab Results  Component Value Date   TSH 1.275 10/04/2022    Therapeutic Level Labs: No results found for: LITHIUM No results found for: VALPROATE No results found for: CBMZ  Current Medications: Current Outpatient Medications  Medication Sig Dispense Refill   buPROPion  (WELLBUTRIN  XL) 300 MG 24 hr tablet Take 1 tablet (300 mg total) by mouth every morning. 30 tablet 3   clobetasol  cream (TEMOVATE ) 0.05 % Apply topically to affected area twice daily for no longer than 2 weeks consistently, then as needed after then. Wash hands well after use. 45 g 0   cyclobenzaprine  (FLEXERIL ) 10 MG tablet Take 1 tablet (10 mg total) by mouth 2 (two) times daily as needed for muscle spasms. 20 tablet 0   divalproex  (DEPAKOTE ) 500 MG Mary tablet Take 1 tablet (500 mg total) by mouth 3 (three) times daily. 90 tablet 3   hydrOXYzine  (ATARAX ) 10 MG tablet Take 1 tablet (10 mg total) by mouth 3 (three) times daily as needed. 90 tablet 3   lidocaine  (XYLOCAINE ) 2 % solution Use as directed 10 mLs in the mouth or throat every 4 (four) hours as needed for mouth pain. 100 mL 0   ondansetron  (ZOFRAN ) 4 MG tablet Take 1 tablet (4 mg total) by mouth every 8 (eight) hours as needed  for nausea or vomiting. 10 tablet 0   pantoprazole  (PROTONIX ) 40 MG tablet Take 1 tablet (40 mg total) by mouth 2 (two) times daily. 30 tablet 1   QUEtiapine  (SEROQUEL ) 100 MG tablet Take 1 tablet (100 mg total) by mouth at bedtime. 30 tablet 3   QUEtiapine  (SEROQUEL ) 50 MG tablet Take 1 tablet (  50 mg total) by mouth at bedtime. 90 tablet 1   rosuvastatin  (CRESTOR ) 10 MG tablet Take 1 tablet (10 mg total) by mouth daily. 90 tablet 1   sucralfate  (CARAFATE ) 1 g tablet Take 1 tablet (1 g total) by mouth 4 (four) times daily. Take 10 mLs (1 g total) by mouth 4 (four) times daily - with meals and at bedtime. 120 tablet 0   No current facility-administered medications for this visit.     Musculoskeletal: Strength & Muscle Tone: within normal limits and Telehealth visit Gait & Station:  Unable to assess due to telehealth visit Patient leans: N/A  Psychiatric Specialty Exam: Review of Systems  There were no vitals taken for this visit.There is no height or weight on file to calculate BMI.  General Appearance: Well Groomed  Eye Contact:  Good  Speech:  Clear and Coherent and Normal Rate  Volume:  Normal  Mood:  Anxious and Depressed  Affect:  Appropriate and Congruent  Thought Process:  Coherent, Goal Directed and Linear  Orientation:  Full (Time, Place, and Person)  Thought Content: WDL and Logical   Suicidal Thoughts:  Yes.  without intent/plan  Homicidal Thoughts:  No  Memory:  Immediate;   Good Recent;   Good Remote;   Good  Judgement:  Good  Insight:  Good  Psychomotor Activity:  Normal  Concentration:  Concentration: Good and Attention Span: Good  Recall:  Good  Fund of Knowledge: Good  Language: Good  Akathisia:  No  Handed:  Right  AIMS (if indicated): Not done  Assets:  Communication Skills Desire for Improvement Financial Resources/Insurance Housing Social Support  ADL's:  Intact  Cognition: WNL  Sleep:  Fair   Screenings: GAD-7    Flowsheet Row Video Visit  from 03/20/2023 in [facility] Video Visit from 12/27/2022 in [facility] Video Visit from 09/27/2022 in [facility] Video Visit from 06/29/2022 in [facility] Video Visit from 04/06/2022 in [facility]  Total GAD-7 Score 19 21 12 15 9       PHQ2-9    Flowsheet Row Video Visit from 03/20/2023 in [facility] Video Visit from 12/27/2022 in [facility] Office Visit from 12/13/2022 in [facility] [place] Office Visit from 11/08/2022 in [facility] [place] Video Visit from 09/27/2022 in [facility]  PHQ-2 Total Score 2 0 0 2 3  PHQ-9 Total Score 8 5 0 10 6      Flowsheet Row Video Visit from 03/20/2023 in [facility] ED from 01/30/2023 in [facility] Emergency Department at [facility] ED from 10/04/2022 in [facility] Emergency Department at [facility]  C-SSRS RISK CATEGORY Error: Q7 should not be populated when Q6 is No No Risk No Risk        Assessment and Plan: Patient continues to endorse symptoms of anxiety, depression, and hypomania.  Today patient agreeable increasing Seroquel  50 mg to 100 mg to help manage mood and sleep. She will restart Wellbutrin  300 mg to help manage depression and Depakote  500 three times daily to help manage mood.  She will continue all other medications as prescribed.  1. Bipolar I disorder, most recent episode depressed (HCC)  Restart- buPROPion  (WELLBUTRIN  XL) 300 MG  24 hr tablet; Take 1 tablet (300 mg total) by mouth every morning.  Dispense: 30 tablet; Refill: 3 Restart- divalproex  (DEPAKOTE ) 500 MG Mary tablet; Take 1 tablet (500 mg total) by mouth 3 (three) times daily.  Dispense: 90 tablet;  Refill: 3 Increased- QUEtiapine  (SEROQUEL ) 100 MG tablet; Take 1 tablet (100 mg total) by mouth at bedtime.  Dispense: 30 tablet; Refill: 3  2. Tobacco dependence  Restart- buPROPion  (WELLBUTRIN  XL) 300 MG 24 hr tablet; Take 1 tablet (300 mg total) by mouth every morning.  Dispense: 30 tablet; Refill: 3  3. GAD (generalized anxiety disorder)  Continue- hydrOXYzine  (ATARAX ) 10 MG tablet; Take 1 tablet (10 mg total) by mouth 3 (three) times daily as needed.  Dispense: 90 tablet; Refill: 3 Increased- QUEtiapine  (SEROQUEL ) 100 MG tablet; Take 1 tablet (100 mg total) by mouth at bedtime.  Dispense: 30 tablet; Refill: 3     Follow up in 2 months   Follow up with therapy   Zane FORBES Bach, NP 03/20/2023, 3:31 PM

## 2023-03-26 ENCOUNTER — Other Ambulatory Visit (HOSPITAL_COMMUNITY): Payer: Self-pay

## 2023-03-26 ENCOUNTER — Telehealth (HOSPITAL_COMMUNITY): Payer: Self-pay | Admitting: Psychiatry

## 2023-03-26 NOTE — Telephone Encounter (Signed)
 Provider informed patient to have the pharmacy fax over her prior authorization request.  Provider gave patient the fax number to the clinic.  She endorsed understanding and agreed.  No other concerns at this time.

## 2023-03-27 ENCOUNTER — Telehealth (HOSPITAL_COMMUNITY): Payer: Self-pay | Admitting: *Deleted

## 2023-03-27 NOTE — Telephone Encounter (Signed)
 Fax received for approval of Quetiapine Fumarate 100mg  until 03/25/2024.

## 2023-03-27 NOTE — Telephone Encounter (Signed)
 Patient medication was approved on cover my meds, I called the nurse Karie Mainland and informed her of the approval.

## 2023-03-28 ENCOUNTER — Encounter: Payer: Self-pay | Admitting: Student

## 2023-03-28 ENCOUNTER — Ambulatory Visit: Payer: MEDICAID | Admitting: Student

## 2023-03-28 VITALS — BP 134/82 | HR 82 | Temp 98.3°F | Ht 64.0 in | Wt 140.1 lb

## 2023-03-28 DIAGNOSIS — K5900 Constipation, unspecified: Secondary | ICD-10-CM | POA: Diagnosis not present

## 2023-03-28 DIAGNOSIS — Z0000 Encounter for general adult medical examination without abnormal findings: Secondary | ICD-10-CM

## 2023-03-28 DIAGNOSIS — M545 Low back pain, unspecified: Secondary | ICD-10-CM | POA: Diagnosis not present

## 2023-03-28 DIAGNOSIS — R112 Nausea with vomiting, unspecified: Secondary | ICD-10-CM | POA: Diagnosis not present

## 2023-03-28 DIAGNOSIS — G8929 Other chronic pain: Secondary | ICD-10-CM | POA: Diagnosis not present

## 2023-03-28 DIAGNOSIS — Z23 Encounter for immunization: Secondary | ICD-10-CM | POA: Insufficient documentation

## 2023-03-28 MED ORDER — CYCLOBENZAPRINE HCL 5 MG PO TABS: 5.0000 mg | ORAL_TABLET | Freq: Three times a day (TID) | ORAL | 0 refills | Status: DC | PRN

## 2023-03-28 MED ORDER — IBUPROFEN 800 MG PO TABS: 800.0000 mg | ORAL_TABLET | Freq: Three times a day (TID) | ORAL | 0 refills | Status: AC | PRN

## 2023-03-28 MED ORDER — ROSUVASTATIN CALCIUM 10 MG PO TABS: 10.0000 mg | ORAL_TABLET | Freq: Every day | ORAL | 1 refills | Status: AC

## 2023-03-28 MED ORDER — POLYETHYLENE GLYCOL 3350 17 GM/SCOOP PO POWD: 1.0000 | Freq: Once | ORAL | 0 refills | Status: AC

## 2023-03-28 MED ORDER — ONDANSETRON HCL 4 MG PO TABS: 4.0000 mg | ORAL_TABLET | Freq: Three times a day (TID) | ORAL | 0 refills | Status: DC | PRN

## 2023-03-28 NOTE — Assessment & Plan Note (Signed)
 Patient endorses having chronic back pain.  She went 2 months ago to the emergency department to have this evaluated.  No imaging was done.  She describes it to be a dull/achy pain in nature.  No trauma to the area.  She has taken Flexeril  in the past which helps.  She has never had physical therapy.  On exam, patient has full range of motion with pain.  No midline spinal tenderness concerning for any fracture.  No radiculopathy symptoms.  Plan: -Refer patient to physical therapy -Flexeril  provided for severe pain days -Short course of NSAIDs provided -Education about abdominal pain, gastric ulcers, melena given to patient

## 2023-03-28 NOTE — Progress Notes (Signed)
 CC: 3 month follow up   HPI:  [name] is a 58 y.o. female with a past medical history of reactive airway disease, chronic hep C, primary hyperparathyroidism who presents for follow-up appointment.  Please see assessment and plan for full HPI.   Past Medical History:  Diagnosis Date   Bipolar depression (HCC)    Borderline personality disorder (HCC)    COPD (chronic obstructive pulmonary disease) (HCC)    Depression    GERD (gastroesophageal reflux disease)    Hiatal hernia    History of alcohol abuse    per pt none since 10/ 2014   History of attempted suicide    History of drug abuse in remission (HCC)    per pt in remission since 12-26-2012  polysubstance dependence (alcohol, crack, cocaine, 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   History of hepatitis C per pt first dx 2009 (approx)   consulted w/ dr comer (infectious disease) note 04-26-2016 , pt states was called and told per lab work no longer has hepatitis c    History of kidney stones    Hypercalcemia    mild and long-standing--- pt asymptomatic   Hyperlipidemia    Nausea and vomiting 07/04/2011   Primary hyperparathyroidism [facility])    endocrinology --  dr Hubert Madden   Pruritic erythematous rash    lower legs   PTSD (post-traumatic stress disorder)    Right ureteral stone    Schizophrenia (HCC)    Substance abuse (HCC)    Vitamin D  deficiency      Current Outpatient Medications:    cyclobenzaprine  (FLEXERIL ) 5 MG tablet, Take 1 tablet (5 mg total) by mouth 3 (three) times daily as needed for muscle spasms., Disp: 30 tablet, Rfl: 0   ibuprofen  (ADVIL ) 800 MG tablet, Take 1 tablet (800 mg total) by mouth every 8 (eight) hours as needed for up to 5 days., Disp: 15 tablet, Rfl: 0   polyethylene glycol powder (MIRALAX ) 17 GM/SCOOP powder, Take 255 g by mouth once for 1 dose., Disp: 255 g, Rfl: 0   buPROPion  (WELLBUTRIN  XL) 300 MG 24 hr tablet, Take 1 tablet (300 mg total) by mouth every morning., Disp: 30  tablet, Rfl: 3   divalproex  (DEPAKOTE ) 500 MG DR tablet, Take 1 tablet (500 mg total) by mouth 3 (three) times daily., Disp: 90 tablet, Rfl: 3   hydrOXYzine  (ATARAX ) 10 MG tablet, Take 1 tablet (10 mg total) by mouth 3 (three) times daily as needed., Disp: 90 tablet, Rfl: 3   ondansetron  (ZOFRAN ) 4 MG tablet, Take 1 tablet (4 mg total) by mouth every 8 (eight) hours as needed for nausea or vomiting., Disp: 30 tablet, Rfl: 0   pantoprazole  (PROTONIX ) 40 MG tablet, Take 1 tablet (40 mg total) by mouth 2 (two) times daily., Disp: 30 tablet, Rfl: 1   QUEtiapine  (SEROQUEL ) 100 MG tablet, Take 1 tablet (100 mg total) by mouth at bedtime., Disp: 30 tablet, Rfl: 3   rosuvastatin  (CRESTOR ) 10 MG tablet, Take 1 tablet (10 mg total) by mouth daily., Disp: 90 tablet, Rfl: 1   sucralfate  (CARAFATE ) 1 g tablet, Take 1 tablet (1 g total) by mouth 4 (four) times daily. Take 10 mLs (1 g total) by mouth 4 (four) times daily - with meals and at bedtime., Disp: 120 tablet, Rfl: 0  Review of Systems:    MSK: Patient endorse back pain  Physical Exam:  Vitals:   03/28/23 1553  BP: 134/82  Pulse: 82  Temp:  98.3 F (36.8 C)  TempSrc: Oral  SpO2: 94%  Weight: 140 lb 1.6 oz (63.5 kg)  Height: 5\' 4"  (1.626 m)   General: Patient is sitting comfortably in the room  Cardio: Regular rate and rhythm, no murmurs, rubs or gallops Pulmonary: Clear to ausculation bilaterally with no rales, rhonchi, and crackles  Back: No midline tenderness, no step off or deformities noted. No paraspinal muscle tenderness. Normal ROM with pain on extension, flexion, and side bending    Assessment & Plan:   Nausea and vomiting Patient continues to have the same nausea and vomiting that she is having. She states that she has episodes of vomiting and nausea that have not improved. She has been following with the GI doctors and even had scoping that has not been revealing about her symptoms. She states that she has been taking her  Carafate  and her Protonix .  She does request some Zofran .  On exam, patient does not have any abdominal pain.  Plan: -Continue to follow with GI -Continue Protonix  and Carafate  -Patient could potentially have a gastric emptying study to see if this is related to delayed gastric emptying but unclear why patient had delayed gastric emptying given patient does not have diabetes -Refill Zofran   Immunization due Administered pneumococcal vaccine today  Constipation Patient endorses 4-day history of constipation.  She states she finally had a small bowel movement today.  She states that she would like something for constipation.  Of note patient was taking Flexeril  about 2 months ago, which she states she is not taking anymore which could contribute.  Advised patient to avoid medications as such.    Plan: -MiraLAX  provided  Chronic low back pain Patient endorses having chronic back pain.  She went 2 months ago to the emergency department to have this evaluated.  No imaging was done.  She describes it to be a dull/achy pain in nature.  No trauma to the area.  She has taken Flexeril  in the past which helps.  She has never had physical therapy.  On exam, patient has full range of motion with pain.  No midline spinal tenderness concerning for any fracture.  No radiculopathy symptoms.  Plan: -Refer patient to physical therapy -Flexeril  provided for severe pain days -Short course of NSAIDs provided -Education about abdominal pain, gastric ulcers, melena given to patient  Patient discussed with Dr. Eleonore Grill, DO PGY-2 Internal Medicine Resident  Pager: [phone]

## 2023-03-28 NOTE — Assessment & Plan Note (Signed)
Administered pneumococcal vaccine today.

## 2023-03-28 NOTE — Assessment & Plan Note (Signed)
 Patient endorses 4-day history of constipation.  She states she finally had a small bowel movement today.  She states that she would like something for constipation.  Of note patient was taking Flexeril  about 2 months ago, which she states she is not taking anymore which could contribute.  Advised patient to avoid medications as such.    Plan: -MiraLAX  provided

## 2023-03-28 NOTE — Assessment & Plan Note (Signed)
 Patient continues to have the same nausea and vomiting that she is having. She states that she has episodes of vomiting and nausea that have not improved. She has been following with the GI doctors and even had scoping that has not been revealing about her symptoms. She states that she has been taking her Carafate  and her Protonix .  She does request some Zofran .  On exam, patient does not have any abdominal pain.  Plan: -Continue to follow with GI -Continue Protonix  and Carafate  -Patient could potentially have a gastric emptying study to see if this is related to delayed gastric emptying but unclear why patient had delayed gastric emptying given patient does not have diabetes -Refill Zofran 

## 2023-03-28 NOTE — Patient Instructions (Addendum)
 Mary [name],Thank you for allowing me to take part in your care today.  Here are your instructions.  1.  Regarding your back pain, I am referring you to physical therapy.  Please await phone call to schedule.  2.  Regarding your back pain I have also given you some Flexeril , please take on days where you have significant pain.  Please take ibuprofen  as well for the next 5 days.  Please take these medications with food.  3.  I am sending some MiraLAX , please take this nightly.  If you do not have a bowel movement in the next 2 days, please take this twice a day.  4.  Please come back in 3 months for back pain follow-up.  5.  Regarding your cholesterol, please start taking your Lipitor daily.  Thank you, Dr. Lydia Sams  If you have any other questions please contact the internal medicine clinic at [phone] If it is after hours, please call the [place] at [phone] and then ask the person who picks up for the resident on call.

## 2023-03-30 NOTE — Progress Notes (Signed)
 Internal Medicine Clinic Attending  Case discussed with the resident at the time of the visit.  We reviewed the resident's history and exam and pertinent patient test results.  I agree with the assessment, diagnosis, and plan of care documented in the resident's note.

## 2023-04-02 NOTE — Telephone Encounter (Signed)
 error

## 2023-04-16 NOTE — Therapy (Incomplete)
OUTPATIENT PHYSICAL THERAPY THORACOLUMBAR EVALUATION   Patient Name: Mary [name] MRN: [id number] DOB:[birth year], 58 y.o., female Today's Date: 04/16/2023  END OF SESSION:   Past Medical History:  Diagnosis Date   Bipolar depression (HCC)    Borderline personality disorder (HCC)    COPD (chronic obstructive pulmonary disease) (HCC)    Depression    GERD (gastroesophageal reflux disease)    Hiatal hernia    History of alcohol abuse    per pt none since 10/ 2014   History of attempted suicide    History of drug abuse in remission (HCC)    per pt in remission since 12-26-2012  polysubstance dependence (alcohol, crack, cocaine, 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   History of hepatitis C per pt first dx 2009 (approx)   consulted w/ dr comer (infectious disease) note 04-26-2016 , pt states was called and told per lab work no longer has hepatitis c    History of kidney stones    Hypercalcemia    mild and long-standing--- pt asymptomatic   Hyperlipidemia    Nausea and vomiting 07/04/2011   Primary hyperparathyroidism [facility])    endocrinology --  dr Lucianne Muss   Pruritic erythematous rash    lower legs   PTSD (post-traumatic stress disorder)    Right ureteral stone    Schizophrenia (HCC)    Substance abuse (HCC)    Vitamin D deficiency    Past Surgical History:  Procedure Laterality Date   COLONOSCOPY  12/08/2022   at [facility] with Scherry Ran at same time as egd   CYSTOSCOPY N/A 01/10/2017   Procedure: CYSTOSCOPY;  Surgeon: Hermina Staggers, MD;  Location: [facility];  Service: Gynecology;  Laterality: N/A;   CYSTOSCOPY W/ URETERAL STENT PLACEMENT Right 07/14/2016   Procedure: CYSTOSCOPY WITH STENT REPLACEMENT;  Surgeon: Malen Gauze, MD;  Location: [facility];  Service: Urology;  Laterality: Right;   CYSTOSCOPY WITH RETROGRADE PYELOGRAM, URETEROSCOPY AND STENT PLACEMENT Right 06/28/2016   Procedure: CYSTOSCOPY WITH RETROGRADE PYELOGRAM, URETEROSCOPY AND  STENT PLACEMENT;  Surgeon: Malen Gauze, MD;  Location: [facility];  Service: Urology;  Laterality: Right;   CYSTOSCOPY/RETROGRADE/URETEROSCOPY/STONE EXTRACTION WITH BASKET Right 07/14/2016   Procedure: CYSTOSCOPY/RETROGRADE/URETEROSCOPY/STONE EXTRACTION WITH BASKET;  Surgeon: Malen Gauze, MD;  Location: [facility];  Service: Urology;  Laterality: Right;   DIAGNOSTIC LAPAROSCOPIC LIVER BIOPSY N/A 02/10/2013   Procedure: DIAGNOSTIC LAPAROSCOPIC ;  Surgeon: Ardeth Sportsman, MD;  Location: [facility];  Service: General;  Laterality: N/A;  DIAGNOSTIC LAPAROSCOPY,laparoscopic ventral hernia repair with mesh lysis of adhesions   HERNIA REPAIR     HOLMIUM LASER APPLICATION Right 07/14/2016   Procedure: HOLMIUM LASER APPLICATION;  Surgeon: Malen Gauze, MD;  Location: [facility];  Service: Urology;  Laterality: Right;   SPLENECTOMY  2004   TUBAL LIGATION     UPPER GASTROINTESTINAL ENDOSCOPY  12/08/2022   with colonoscopy at [facility] , Veena Nandigam, Hiatal Hernia, esophageal stricture, dilation,   VAGINAL HYSTERECTOMY N/A 01/10/2017   Procedure: HYSTERECTOMY VAGINAL W/ BILATERAL SALPINGECTOMY WITH REPAIR OF INCIDENTAL CYSTOTOMY;  Surgeon: Hermina Staggers, MD;  Location: [facility];  Service: Gynecology;  Laterality: N/A;   Patient Active Problem List   Diagnosis Date Noted   Chronic low back pain 03/28/2023   Immunization due 03/28/2023   Constipation 03/28/2023   Primary hyperparathyroidism (HCC) 12/13/2022   Hyperlipidemia 12/13/2022   Tobacco dependence 12/06/2020   GAD (generalized anxiety disorder) 11/11/2019   Bipolar I disorder,  most recent episode depressed (HCC) 11/11/2019   Reactive airway disease without asthma 04/12/2016   Chronic hepatitis C without hepatic coma (HCC)    Drug abuse and dependence (HCC)    Nausea and vomiting 07/04/2011    PCP: Modena Slater, Do  REFERRING PROVIDER: Reymundo Poll, MD  REFERRING DIAG: M54.50,G89.29  (ICD-10-CM) - Chronic low back pain, unspecified back pain laterality, unspecified whether sciatica present   Rationale for Evaluation and Treatment: Rehabilitation  THERAPY DIAG:  No diagnosis found.  ONSET DATE: ***  SUBJECTIVE:                                                                                                                                                                                           SUBJECTIVE STATEMENT: Eval statement 04/16/2023: ***  PERTINENT HISTORY:  ***  PAIN:  Are you having pain? Yes: NPRS scale: *** Pain location: *** Pain description: *** Aggravating factors: *** Relieving factors: ***  PRECAUTIONS: {Therapy precautions:24002}  RED FLAGS: {PT Red Flags:29287}   WEIGHT BEARING RESTRICTIONS: {Yes ***/No:24003}  FALLS:  Has patient fallen in last 6 months? {fallsyesno:27318}  LIVING ENVIRONMENT: Lives with: {[facility] lives with:25569::"lives with their family"} Lives in: {Lives in:25570} Stairs: {opstairs:27293} Has following equipment at home: {Assistive devices:23999}  OCCUPATION: ***  PLOF: {PLOF:24004}  PATIENT GOALS: ***  NEXT MD VISIT: ***  OBJECTIVE:  Note: Objective measures were completed at Evaluation unless otherwise noted.  DIAGNOSTIC FINDINGS:  ***  PATIENT SURVEYS:  LEFS ***  COGNITION: Overall cognitive status: {cognition:24006}     SENSATION: {sensation:27233}  MUSCLE LENGTH: Hamstrings: Right *** deg; Left *** deg Maisie Fus test: Right ***  POSTURE: {posture:25561}  PALPATION: ***   Lumbar contraction pattern  L Multifidus:  R Multifidus:  LUMBAR ROM:   AROM eval  Flexion   Extension   Right lateral flexion   Left lateral flexion   Right rotation   Left rotation    (Blank rows = not tested)  ! Indicates pain with testing  LOWER EXTREMITY ROM:     {AROM/PROM:27142}  Right eval Left eval  Hip flexion    Hip extension    Hip abduction    Hip adduction    Hip internal rotation     Hip external rotation    Knee flexion    Knee extension    Ankle dorsiflexion    Ankle plantarflexion    Ankle inversion    Ankle eversion     (Blank rows = not tested)  ! Indicates pain with testing  LOWER EXTREMITY MMT:    MMT Right eval Left eval  Hip flexion    Hip extension  Hip abduction    Hip adduction    Hip internal rotation    Hip external rotation    Knee flexion    Knee extension    Ankle dorsiflexion    Ankle plantarflexion    Ankle inversion    Ankle eversion     (Blank rows = not tested)   ! Indicates pain with testing LUMBAR SPECIAL TESTS:  {lumbar special test:25242}  FUNCTIONAL TESTS:  {Functional tests:24029}  GAIT: Distance walked: *** Assistive device utilized: {Assistive devices:23999} Level of assistance: {Levels of assistance:24026} Comments: ***  [facility] Adult PT Treatment:                                                DATE: 04/16/2023  Therapeutic Exercise: *** Manual Therapy: *** Neuromuscular re-ed: *** Therapeutic Activity: *** Modalities: *** Self Care: ***                                                                                                                                PATIENT EDUCATION:  Education details: Pt received education regarding HEP performance, ADL performance, functional activity tolerance, impairment education, appropriate performance of therapeutic activities. *** Person educated: {Person educated:25204} Education method: {Education Method:25205} Education comprehension: {Education Comprehension:25206}  HOME EXERCISE PROGRAM: ***  ASSESSMENT:  CLINICAL IMPRESSION: Eval impression (04/16/2023): Pt. attended today's physical therapy session for evaluation of ***. Pt has complaints of ***. Pt has notable deficits with ***.  Signs and symptoms are concurrent with ***. Pt would benefit from therapeutic focus on ***.  Treatment performed today focused on *** Pt demonstrated *** understanding of  education provided. required *** cues and *** assistance for appropriate performance with today's activities. Pt requires the intervention of skilled outpatient physical therapy to address the aforementioned deficits and progress towards a functional level in line with therapeutic goals.    OBJECTIVE IMPAIRMENTS: {opptimpairments:25111}.   ACTIVITY LIMITATIONS: {activitylimitations:27494}  PARTICIPATION LIMITATIONS: {participationrestrictions:25113}  PERSONAL FACTORS: {Personal factors:25162} are also affecting patient's functional outcome.   REHAB POTENTIAL: {rehabpotential:25112}  CLINICAL DECISION MAKING: {clinical decision making:25114}  EVALUATION COMPLEXITY: {Evaluation complexity:25115}   GOALS: Goals reviewed with patient? YES  SHORT TERM GOALS: Target date: ***  Pt will be independent with administered HEP to demonstrate the competency necessary for long term managemnet of symptoms at home. Baseline: Goal status: INITIAL  2.  *** Baseline:  Goal status: INITIAL  3.  *** Baseline:  Goal status: INITIAL  4.  *** Baseline:  Goal status: INITIAL  5.  *** Baseline:  Goal status: INITIAL  6.  *** Baseline:  Goal status: INITIAL  LONG TERM GOALS: Target date: ***  Pt. Will achieve a MODI score of *** as to demonstrate improvement in self-perceived functional ability with daily activities.  Baseline:  Goal status: INITIAL  2.  Pt will improve  Global hip strength to a ***/5 to demonstrate improvement in strength for quality of motion and activity performance.  Baseline:  Goal status: INITIAL  3.  Pt will improve Lumbar AROM to *** of standardized norms with less than ***/10 pain to demonstrate necessary mobility for high quality and safe ADLs  Baseline:  Goal status: INITIAL  4.  *** Baseline:  Goal status: INITIAL  5.  *** Baseline:  Goal status: INITIAL  6.  *** Baseline:  Goal status: INITIAL  PLAN:  PT FREQUENCY: {rehab  frequency:25116}  PT DURATION: {rehab duration:25117}  PLANNED INTERVENTIONS: {rehab planned interventions:25118::"97110-Therapeutic exercises","97530- Therapeutic [phone]- Neuromuscular re-education","97535- Self MVHQ","46962- Manual therapy"}.  PLAN FOR NEXT SESSION: Review HEP, Begin POC as detailed in assessment   Sheliah Plane, PT, DPT 04/16/2023, 3:27 PM

## 2023-04-17 ENCOUNTER — Ambulatory Visit: Payer: MEDICAID | Attending: Internal Medicine

## 2023-04-17 NOTE — Therapy (Deleted)
 OUTPATIENT PHYSICAL THERAPY THORACOLUMBAR EVALUATION   Patient Name: Mary [name] MRN: [id number] DOB:September 20, 1965, 58 y.o., female Today's Date: 04/17/2023  END OF SESSION:   Past Medical History:  Diagnosis Date   Bipolar depression (HCC)    Borderline personality disorder (HCC)    COPD (chronic obstructive pulmonary disease) (HCC)    Depression    GERD (gastroesophageal reflux disease)    Hiatal hernia    History of alcohol abuse    per pt none since 10/ 2014   History of attempted suicide    History of drug abuse in remission (HCC)    per pt in remission since 12-26-2012  polysubstance dependence (alcohol, crack, cocaine, 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   History of hepatitis C per pt first dx 2009 (approx)   consulted w/ dr comer (infectious disease) note 04-26-2016 , pt states was called and told per lab work no longer has hepatitis c    History of kidney stones    Hypercalcemia    mild and long-standing--- pt asymptomatic   Hyperlipidemia    Nausea and vomiting 07/04/2011   Primary hyperparathyroidism [facility])    endocrinology --  dr von   Pruritic erythematous rash    lower legs   PTSD (post-traumatic stress disorder)    Right ureteral stone    Schizophrenia (HCC)    Substance abuse (HCC)    Vitamin D  deficiency    Past Surgical History:  Procedure Laterality Date   COLONOSCOPY  12/08/2022   at [facility] with Veena Nandigam at same time as egd   CYSTOSCOPY N/A 01/10/2017   Procedure: CYSTOSCOPY;  Surgeon: Lorence Ozell CROME, MD;  Location: [facility];  Service: Gynecology;  Laterality: N/A;   CYSTOSCOPY W/ URETERAL STENT PLACEMENT Right 07/14/2016   Procedure: CYSTOSCOPY WITH STENT REPLACEMENT;  Surgeon: Sherrilee Belvie CROME, MD;  Location[facility];  Service: Urology;  Laterality: Right;   CYSTOSCOPY WITH RETROGRADE PYELOGRAM, URETEROSCOPY AND STENT PLACEMENT Right 06/28/2016   Procedure: CYSTOSCOPY WITH RETROGRADE PYELOGRAM, URETEROSCOPY AND  STENT PLACEMENT;  Surgeon: Belvie CROME Sherrilee, MD;  Location: [facility];  Service: Urology;  Laterality: Right;   CYSTOSCOPY/RETROGRADE/URETEROSCOPY/STONE EXTRACTION WITH BASKET Right 07/14/2016   Procedure: CYSTOSCOPY/RETROGRADE/URETEROSCOPY/STONE EXTRACTION WITH BASKET;  Surgeon: Sherrilee Belvie CROME, MD;  Location: [facility];  Service: Urology;  Laterality: Right;   DIAGNOSTIC LAPAROSCOPIC LIVER BIOPSY N/A 02/10/2013   Procedure: DIAGNOSTIC LAPAROSCOPIC ;  Surgeon: Elspeth KYM Schultze, MD;  Location: [facility];  Service: General;  Laterality: N/A;  DIAGNOSTIC LAPAROSCOPY,laparoscopic ventral hernia repair with mesh lysis of adhesions   HERNIA REPAIR     HOLMIUM LASER APPLICATION Right 07/14/2016   Procedure: HOLMIUM LASER APPLICATION;  Surgeon: Sherrilee Belvie CROME, MD;  Location: [facility];  Service: Urology;  Laterality: Right;   SPLENECTOMY  2004   TUBAL LIGATION     UPPER GASTROINTESTINAL ENDOSCOPY  12/08/2022   with colonoscopy at [facility] , Veena Nandigam, Hiatal Hernia, esophageal stricture, dilation,   VAGINAL HYSTERECTOMY N/A 01/10/2017   Procedure: HYSTERECTOMY VAGINAL W/ BILATERAL SALPINGECTOMY WITH REPAIR OF INCIDENTAL CYSTOTOMY;  Surgeon: Lorence Ozell CROME, MD;  Location: [facility];  Service: Gynecology;  Laterality: N/A;   Patient Active Problem List   Diagnosis Date Noted   Chronic low back pain 03/28/2023   Immunization due 03/28/2023   Constipation 03/28/2023   Primary hyperparathyroidism (HCC) 12/13/2022   Hyperlipidemia 12/13/2022   Tobacco dependence 12/06/2020   GAD (generalized anxiety disorder) 11/11/2019   Bipolar I disorder,  most recent episode depressed (HCC) 11/11/2019   Reactive airway disease without asthma 04/12/2016   Chronic hepatitis C without hepatic coma (HCC)    Drug abuse and dependence (HCC)    Nausea and vomiting 07/04/2011    PCP: Tobie Gaines, DO   REFERRING PROVIDER: Forest Coy, MD  REFERRING DIAG: M54.50,G89.29  (ICD-10-CM) - Chronic low back pain, unspecified back pain laterality, unspecified whether sciatica present  Rationale for Evaluation and Treatment: Rehabilitation  THERAPY DIAG:  No diagnosis found.  ONSET DATE: ***  SUBJECTIVE:                                                                                                                                                                                           SUBJECTIVE STATEMENT: ***  PERTINENT HISTORY:  Chronic low back pain Patient endorses having chronic back pain.  She went 2 months ago to the emergency department to have this evaluated.  No imaging was done.  She describes it to be a dull/achy pain in nature.  No trauma to the area.  She has taken Flexeril  in the past which helps.  She has never had physical therapy.  On exam, patient has full range of motion with pain.  No midline spinal tenderness concerning for any fracture.  No radiculopathy symptoms.   Plan: -Refer patient to physical therapy -Flexeril  provided for severe pain days -Short course of NSAIDs provided -Education about abdominal pain, gastric ulcers, melena given to patient  PAIN:  Are you having pain? {OPRCPAIN:27236}  PRECAUTIONS: None  RED FLAGS: None   WEIGHT BEARING RESTRICTIONS: No  FALLS:  Has patient fallen in last 6 months? No  OCCUPATION: ***  PLOF: Independent  PATIENT GOALS: ***  NEXT MD VISIT: ***  OBJECTIVE:  Note: Objective measures were completed at Evaluation unless otherwise noted.  DIAGNOSTIC FINDINGS:  ***  PATIENT SURVEYS:  Modified Oswestry ***   MUSCLE LENGTH: Hamstrings: Right *** deg; Left *** deg Debby test: Right *** deg; Left *** deg  POSTURE: {posture:25561}  PALPATION: ***  LUMBAR ROM:   AROM eval  Flexion   Extension   Right lateral flexion   Left lateral flexion   Right rotation   Left rotation    (Blank rows = not tested)  LOWER EXTREMITY ROM:     {AROM/PROM:27142}  Right eval  Left eval  Hip flexion    Hip extension    Hip abduction    Hip adduction    Hip internal rotation    Hip external rotation    Knee flexion    Knee extension    Ankle dorsiflexion    Ankle plantarflexion  Ankle inversion    Ankle eversion     (Blank rows = not tested)  LOWER EXTREMITY MMT:    MMT Right eval Left eval  Hip flexion    Hip extension    Hip abduction    Hip adduction    Hip internal rotation    Hip external rotation    Knee flexion    Knee extension    Ankle dorsiflexion    Ankle plantarflexion    Ankle inversion    Ankle eversion     (Blank rows = not tested)  LUMBAR SPECIAL TESTS:  Straight leg raise test: {pos/neg:25243}, Slump test: {pos/neg:25243}, and Thomas test: {pos/neg:25243}  FUNCTIONAL TESTS:  30 seconds chair stand test  GAIT: Distance walked: *** Assistive device utilized: {Assistive devices:23999} Level of assistance: {Levels of assistance:24026} Comments: ***  TREATMENT DATE: ***                                                                                                                                 PATIENT EDUCATION:  Education details: Discussed eval findings, rehab rationale and POC and patient is in agreement  Person educated: Patient Education method: Explanation Education comprehension: verbalized understanding and needs further education  HOME EXERCISE PROGRAM: ***  ASSESSMENT:  CLINICAL IMPRESSION: Patient is a *** y.o. *** who was seen today for physical therapy evaluation and treatment for ***.   OBJECTIVE IMPAIRMENTS: {opptimpairments:25111}.   ACTIVITY LIMITATIONS: {activitylimitations:27494}  PERSONAL FACTORS: {Personal factors:25162} are also affecting patient's functional outcome.   REHAB POTENTIAL: {rehabpotential:25112}  CLINICAL DECISION MAKING: Stable/uncomplicated  EVALUATION COMPLEXITY: Low   GOALS: Goals reviewed with patient? No  SHORT TERM GOALS: Target date: ***  Patient to  demonstrate independence in HEP  Baseline: Goal status: INITIAL  2.  *** Baseline:  Goal status: INITIAL  3.  *** Baseline:  Goal status: INITIAL  4.  *** Baseline:  Goal status: INITIAL  5.  *** Baseline:  Goal status: INITIAL  6.  *** Baseline:  Goal status: INITIAL  LONG TERM GOALS: Target date: ***  Patient will increase 30s chair stand reps from *** to *** with/without arms to demonstrate and improved functional ability with less pain/difficulty as well as reduce fall risk.  Baseline:  Goal status: INITIAL  2.  Patient will score at least ***% on FOTO to signify clinically meaningful improvement in functional abilities.   Baseline:  Goal status: INITIAL  3.  Patient will acknowledge ***/10 pain at least once during episode of care   Baseline:  Goal status: INITIAL  4.  *** Baseline:  Goal status: INITIAL  5.  *** Baseline:  Goal status: INITIAL  6.  *** Baseline:  Goal status: INITIAL  PLAN:  PT FREQUENCY: 1-2x/week  PT DURATION: 6 weeks  PLANNED INTERVENTIONS: 97164- PT Re-evaluation, 97110-Therapeutic exercises, 97530- Therapeutic activity, 97112- Neuromuscular re-education, 97535- Self Care, 02859- Manual therapy, and Spinal mobilization.  PLAN FOR NEXT SESSION: HEP review and  update, manual techniques as appropriate, aerobic tasks, ROM and flexibility activities, strengthening and PREs, TPDN, gait and balance training as needed     Reyes CHRISTELLA Kohut, PT 04/17/2023, 12:47 PM

## 2023-04-24 ENCOUNTER — Other Ambulatory Visit: Payer: Self-pay

## 2023-04-24 MED ORDER — ONDANSETRON HCL 4 MG PO TABS: 4.0000 mg | ORAL_TABLET | Freq: Three times a day (TID) | ORAL | 0 refills | Status: DC | PRN

## 2023-05-03 ENCOUNTER — Encounter (HOSPITAL_COMMUNITY)
Admission: RE | Admit: 2023-05-03 | Discharge: 2023-05-03 | Disposition: A | Discharge status: Home / Self Care | Payer: MEDICAID | Source: Ambulatory Visit | Attending: Endocrinology | Admitting: Endocrinology

## 2023-05-03 DIAGNOSIS — E210 Primary hyperparathyroidism: Secondary | ICD-10-CM | POA: Insufficient documentation

## 2023-05-03 MED ORDER — TECHNETIUM TC 99M SESTAMIBI - CARDIOLITE
24.2000 | Freq: Once | INTRAVENOUS | Status: AC
  Administered 2023-05-03: 24.2 via INTRAVENOUS

## 2023-05-11 ENCOUNTER — Other Ambulatory Visit: Payer: Self-pay

## 2023-05-11 ENCOUNTER — Encounter (HOSPITAL_COMMUNITY): Payer: Self-pay

## 2023-05-11 ENCOUNTER — Emergency Department (HOSPITAL_COMMUNITY)
Admission: EM | Admit: 2023-05-11 | Discharge: 2023-05-11 | Disposition: A | Discharge status: Home / Self Care | Payer: MEDICAID | Attending: Emergency Medicine | Admitting: Emergency Medicine

## 2023-05-11 DIAGNOSIS — M546 Pain in thoracic spine: Secondary | ICD-10-CM | POA: Diagnosis present

## 2023-05-11 MED ORDER — DICLOFENAC SODIUM 1 % EX GEL: 2.0000 g | Freq: Four times a day (QID) | CUTANEOUS | 0 refills | Status: AC

## 2023-05-11 MED ORDER — IBUPROFEN 800 MG PO TABS: 800.0000 mg | ORAL_TABLET | Freq: Three times a day (TID) | ORAL | 0 refills | Status: AC

## 2023-05-11 MED ORDER — KETOROLAC TROMETHAMINE 15 MG/ML IJ SOLN
15.0000 mg | Freq: Once | INTRAMUSCULAR | Status: AC
  Administered 2023-05-11: 15 mg via INTRAMUSCULAR
  Filled 2023-05-11: qty 1

## 2023-05-11 NOTE — ED Notes (Signed)
 Pt provided discharge instructions and prescription information. Pt was given the opportunity to ask questions and questions were answered.

## 2023-05-11 NOTE — Discharge Instructions (Signed)
 You were seen in the emergency department today for concerns of back pain. On exam, appears that you have significant tension and spasming in the mid thoracic spine.  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  If you were to develop sudden chest pain or notable shortness of breath, it would return the emergency department.  A prescription for ibuprofen and diclofenac topical gel has been sent to your pharmacy.  You are also given a single dose of Toradol here in the emergency department.  For any concerns of new or worsening symptoms, return to the ED.

## 2023-05-11 NOTE — ED Provider Notes (Signed)
 [place] [facility]MENT AT [facility] Provider Note   CSN: [id number] Arrival date & time: 05/11/23  1329     History Chief Complaint  Patient presents with   Back Pain    Mary [name] is a 58 y.o. female.  Patient with past history significant for bipolar 1, generalized anxiety disorder, hyperparathyroidism, hyperlipidemia presents emergency department with concerns of back pain.  She reports midthoracic back pain on the left side that has been present for several months but improved after administration of a Toradol injection 2 months ago.  She states that her symptoms have been stable since yesterday.  Suddenly had worsening pain with a cramping type feeling.  States that she has tried taking her muscle relaxer, Flexeril 5 mg, without improvement in symptoms.  Has not been taking Tylenol or ibuprofen to try to supplement for any pain control.  Denies any feelings of chest pain or shortness of breath.  No nausea, vomiting, or diaphoresis.   Back Pain      Home Medications Prior to Admission medications   Medication Sig Start Date End Date Taking? Authorizing Provider  diclofenac Sodium (VOLTAREN) 1 % GEL Apply 2 g topically 4 (four) times daily for 6 days. 05/11/23 05/17/23 Yes Smitty Knudsen, PA-C  ibuprofen (ADVIL) 800 MG tablet Take 1 tablet (800 mg total) by mouth 3 (three) times daily for 14 days. 05/11/23 05/25/23 Yes Smitty Knudsen, PA-C  buPROPion (WELLBUTRIN XL) 300 MG 24 hr tablet Take 1 tablet (300 mg total) by mouth every morning. 03/20/23   Shanna Cisco, NP  cyclobenzaprine (FLEXERIL) 5 MG tablet Take 1 tablet (5 mg total) by mouth 3 (three) times daily as needed for muscle spasms. 03/28/23   Modena Slater, DO  divalproex (DEPAKOTE) 500 MG DR tablet Take 1 tablet (500 mg total) by mouth 3 (three) times daily. 03/20/23   Shanna Cisco, NP  hydrOXYzine (ATARAX) 10 MG tablet Take 1 tablet (10 mg total) by mouth 3 (three) times daily as needed. 03/20/23    Shanna Cisco, NP  ondansetron (ZOFRAN) 4 MG tablet Take 1 tablet (4 mg total) by mouth every 8 (eight) hours as needed for nausea or vomiting. 04/24/23   Modena Slater, DO  pantoprazole (PROTONIX) 40 MG tablet Take 1 tablet (40 mg total) by mouth 2 (two) times daily. 12/13/22 12/13/23  Tawkaliyar, Roya, DO  QUEtiapine (SEROQUEL) 100 MG tablet Take 1 tablet (100 mg total) by mouth at bedtime. 03/20/23   Shanna Cisco, NP  rosuvastatin (CRESTOR) 10 MG tablet Take 1 tablet (10 mg total) by mouth daily. 03/28/23 03/27/24  Modena Slater, DO  sucralfate (CARAFATE) 1 g tablet Take 1 tablet (1 g total) by mouth 4 (four) times daily. Take 10 mLs (1 g total) by mouth 4 (four) times daily - with meals and at bedtime. 12/13/22 01/12/23  Tawkaliyar, Roya, DO  fluticasone-salmeterol (ADVAIR HFA) 230-21 MCG/ACT inhaler Inhale 2 puffs into the lungs 2 (two) times daily. Rinse mouth after each use Patient not taking: Reported on 02/14/2017 07/04/16 07/25/19  Nche, Bonna Gains, NP      Allergies    Patient has no known allergies.    Review of Systems   Review of Systems  Musculoskeletal:  Positive for back pain.  All other systems reviewed and are negative.   Physical Exam Updated Vital Signs BP 111/76   Pulse 66   Temp 98.3 F (36.8 C) (Oral)   Resp 18   Ht 5\' 4"  (  1.626 m)   Wt 63.5 kg   LMP  (LMP Unknown)   SpO2 99%   BMI 24.03 kg/m  Physical Exam Vitals and nursing note reviewed.  Constitutional:      General: She is not in acute distress.    Appearance: She is well-developed.  HENT:     Head: Normocephalic and atraumatic.  Eyes:     Conjunctiva/sclera: Conjunctivae normal.  Cardiovascular:     Rate and Rhythm: Normal rate and regular rhythm.     Heart sounds: No murmur heard. Pulmonary:     Effort: Pulmonary effort is normal. No respiratory distress.     Breath sounds: Normal breath sounds.  Abdominal:     Palpations: Abdomen is soft.     Tenderness: There is no abdominal  tenderness.  Musculoskeletal:        General: No swelling.       Arms:     Cervical back: Neck supple.  Skin:    General: Skin is warm and dry.     Capillary Refill: Capillary refill takes less than 2 seconds.  Neurological:     Mental Status: She is alert.  Psychiatric:        Mood and Affect: Mood normal.     ED Results / Procedures / Treatments   Labs (all labs ordered are listed, but only abnormal results are displayed) Labs Reviewed - No data to display  EKG None  Radiology No results found.  Procedures Procedures    Medications Ordered in ED Medications  ketorolac (TORADOL) 15 MG/ML injection 15 mg (15 mg Intramuscular Given 05/11/23 1609)    ED Course/ Medical Decision Making/ A&P                                 Medical Decision Making Risk Prescription drug management.   This patient presents to the ED for concern of back pain.  Differential diagnosis includes thoracic back pain, muscle spasming, ACS, pneumonia   Medicines ordered and prescription drug management:  I ordered medication including Toradol for back pain Reevaluation of the patient after these medicines showed that the patient improved I have reviewed the patients home medicines and have made adjustments as needed   Problem List / ED Course:  Patient with past history significant for bipolar 1, generalized anxiety disorder, hyperparathyroidism, hyperlipidemia presents the ED with concerns of back pain.  She reports that she works at a [employer] facility in which she performs repetitive motions throughout the entirety of her shift.  She states that she had similar symptoms about 2 months ago that she was seen the emergency department for.  States that at that time, she received a Toradol injection which was helpful in keeping pain controlled for the last 2 months.  States that last night, had worsening pain in her mid thoracic back.  No feelings of chest pain, shortness of breath,  nausea, vomiting, or diaphoresis. Exam is reassuring with point tenderness noted to the paraspinal muscles in the mid thoracic back.  Deep palpation worsens pain.  There is some tension palpated specially with firm palpation.  She denies any chest pain shortness of breath and there is no appreciable heart murmur abnormal lung sounds.  Pain worsens with cross body movement of the right arm as well as sustained elevation of the right shoulder.  This is consistent with patient's type of work repetitive motion in a similar matter.  Will give  a dose of Toradol and reassess. Improvement with toradol. Patient has Flexeril 5mg  at home.  Sent in a prescription for ibuprofen 800 mg and topical diclofenac.  Advise return precautions such as progression to chest pain, shortness of breath, nausea, vomiting or diaphoresis.  Patient otherwise stable at this time for discharge home and outpatient follow-up with PCP.  No acute occasion for more emergent workup as I suspect patient's pain is secondary to an overuse injury with her line of work.  Final Clinical Impression(s) / ED Diagnoses Final diagnoses:  Acute right-sided thoracic back pain    Rx / DC Orders ED Discharge Orders          Ordered    ibuprofen (ADVIL) 800 MG tablet  3 times daily        05/11/23 1533    diclofenac Sodium (VOLTAREN) 1 % GEL  4 times daily        05/11/23 1533              Smitty Knudsen, PA-C 05/12/23 1508    Melene Plan, DO 05/13/23 2257

## 2023-05-11 NOTE — ED Triage Notes (Signed)
 Patient is here for evaluation of upper left back pain. Patient reports that she was seen for the same issues about 2 months ago and received a shot and it took the pain away until now. Pt denies any injuries or falls, reports she does a lot of lifting small items at work.

## 2023-05-18 ENCOUNTER — Encounter: Payer: Self-pay | Admitting: Endocrinology

## 2023-05-23 ENCOUNTER — Telehealth (INDEPENDENT_AMBULATORY_CARE_PROVIDER_SITE_OTHER): Payer: MEDICAID | Admitting: Psychiatry

## 2023-05-23 ENCOUNTER — Encounter (HOSPITAL_COMMUNITY): Payer: Self-pay | Admitting: Psychiatry

## 2023-05-23 DIAGNOSIS — F17210 Nicotine dependence, cigarettes, uncomplicated: Secondary | ICD-10-CM | POA: Diagnosis not present

## 2023-05-23 DIAGNOSIS — F319 Bipolar disorder, unspecified: Secondary | ICD-10-CM | POA: Diagnosis not present

## 2023-05-23 DIAGNOSIS — F3130 Bipolar disorder, current episode depressed, mild or moderate severity, unspecified: Secondary | ICD-10-CM

## 2023-05-23 DIAGNOSIS — F411 Generalized anxiety disorder: Secondary | ICD-10-CM

## 2023-05-23 DIAGNOSIS — F17200 Nicotine dependence, unspecified, uncomplicated: Secondary | ICD-10-CM

## 2023-05-23 MED ORDER — QUETIAPINE FUMARATE 50 MG PO TABS: 50.0000 mg | ORAL_TABLET | Freq: Every day | ORAL | 3 refills | Status: DC

## 2023-05-23 MED ORDER — DIVALPROEX SODIUM 500 MG PO DR TAB: 500.0000 mg | DELAYED_RELEASE_TABLET | Freq: Three times a day (TID) | ORAL | 3 refills | Status: DC

## 2023-05-23 MED ORDER — BUPROPION HCL ER (XL) 300 MG PO TB24: 300.0000 mg | ORAL_TABLET | Freq: Every morning | ORAL | 3 refills | Status: DC

## 2023-05-23 MED ORDER — HYDROXYZINE HCL 10 MG PO TABS
10.0000 mg | ORAL_TABLET | Freq: Three times a day (TID) | ORAL | 3 refills | Status: DC | PRN
Start: 2023-05-23 — End: 2023-07-31

## 2023-05-23 NOTE — Progress Notes (Signed)
 [facility] MD/PA/NP OP Progress Note Virtual Visit via Video Note  I connected with Mary [name] on 05/23/23 at  2:30 PM EDT by a video enabled telemedicine application and verified that I am speaking with the correct person using two identifiers.  Location: Patient: Home Provider: Clinic   I discussed the limitations of evaluation and management by telemedicine and the availability of in person appointments. The patient expressed understanding and agreed to proceed.  I provided 30 minutes of non-face-to-face time during this encounter.       05/23/2023 2:30 PM Mary [name]  MRN:  [id number]  Chief Complaint:   "I want to take Seroquel back to 50 mg"  HPI: 58 year old female seen today for follow up psychiatric evaluation. 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 She is currently being managed on hydroxyzine 10 mg three times daily as needed, Seroquel 100 mg nightly.  She informed Clinical research associate that her medications are somewhat effective in managing her psychiatric conditions.  Today she was well groomed, pleasant, cooperative, and engaged in conversation. She informed Clinical research associate that she would like to reduce Seroquel back to 50 mg. She reports that she was cutting it in half as she was over sleeping (12 hour) with the 100 mg.  Since her last visit she notes that her mood, anxiety, and depression has been well managed. She notes that her Wellbutrin and Depakote has been effective since restarting them. Patient also notes that work at [employer] is going well. She informed Clinical research associate that she is fearful that her performance will slip as it has in the past with other jobs. Patient informed Clinical research associate that recently she kicked her son, his girlfriend, and her child out of her home. She notes that the police was after her  son and she did not want to be involved.  At this time patient reports that she is not able to do a GAD 7 or a PHQ 9 as she needed to leave for work. She does deny SI/HI/VAH, mania or paranoia.  Today Seroquel 100 mg reduced to 50 mg to reduce increased sedation. She will continue all other medications as prescribed.  No other concerns at this time.   Visit Diagnosis:    ICD-10-CM   1. Bipolar I disorder, most recent episode depressed (HCC)  F31.30 buPROPion (WELLBUTRIN XL) 300 MG 24 hr tablet    divalproex (DEPAKOTE) 500 MG DR tablet    QUEtiapine (SEROQUEL) 50 MG tablet    2. Tobacco dependence  F17.200 buPROPion (WELLBUTRIN XL) 300 MG 24 hr tablet    3. GAD (generalized anxiety disorder)  F41.1 hydrOXYzine (ATARAX) 10 MG tablet    QUEtiapine (SEROQUEL) 50 MG tablet          Past Psychiatric History: cocaine dependence (in remission), marijuana dependence (in remission), borderline, schizophrenia substance induced mood disorder, PTSD, and poly substance use  Past Medical History:  Past Medical History:  Diagnosis Date   Bipolar depression (HCC)    Borderline personality disorder (HCC)    COPD (chronic obstructive pulmonary disease) (HCC)    Depression    GERD (gastroesophageal reflux disease)    Hiatal hernia    History of alcohol abuse    per pt none since 10/ 2014   History of attempted suicide    History of drug abuse in remission (HCC)    per pt in  remission since 12-26-2012  polysubstance dependence (alcohol, crack, cocaine, 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   History of hepatitis C per pt first dx 2009 (approx)   consulted w/ dr comer (infectious disease) note 04-26-2016 , pt states was called and told per lab work no longer has hepatitis c    History of kidney stones    Hypercalcemia    mild and long-standing--- pt asymptomatic   Hyperlipidemia    Nausea and vomiting 07/04/2011   Primary hyperparathyroidism [facility])    endocrinology --  dr Lucianne Muss    Pruritic erythematous rash    lower legs   PTSD (post-traumatic stress disorder)    Right ureteral stone    Schizophrenia (HCC)    Substance abuse (HCC)    Vitamin D deficiency     Past Surgical History:  Procedure Laterality Date   COLONOSCOPY  12/08/2022   at [facility] with Scherry Ran at same time as egd   CYSTOSCOPY N/A 01/10/2017   Procedure: CYSTOSCOPY;  Surgeon: Hermina Staggers, MD;  Location: [facility];  Service: Gynecology;  Laterality: N/A;   CYSTOSCOPY W/ URETERAL STENT PLACEMENT Right 07/14/2016   Procedure: CYSTOSCOPY WITH STENT REPLACEMENT;  Surgeon: Malen Gauze, MD;  Location: [facility];  Service: Urology;  Laterality: Right;   CYSTOSCOPY WITH RETROGRADE PYELOGRAM, URETEROSCOPY AND STENT PLACEMENT Right 06/28/2016   Procedure: CYSTOSCOPY WITH RETROGRADE PYELOGRAM, URETEROSCOPY AND STENT PLACEMENT;  Surgeon: Malen Gauze, MD;  Location: [facility];  Service: Urology;  Laterality: Right;   CYSTOSCOPY/RETROGRADE/URETEROSCOPY/STONE EXTRACTION WITH BASKET Right 07/14/2016   Procedure: CYSTOSCOPY/RETROGRADE/URETEROSCOPY/STONE EXTRACTION WITH BASKET;  Surgeon: Malen Gauze, MD;  Location: [facility];  Service: Urology;  Laterality: Right;   DIAGNOSTIC LAPAROSCOPIC LIVER BIOPSY N/A 02/10/2013   Procedure: DIAGNOSTIC LAPAROSCOPIC ;  Surgeon: Ardeth Sportsman, MD;  Location: [facility];  Service: General;  Laterality: N/A;  DIAGNOSTIC LAPAROSCOPY,laparoscopic ventral hernia repair with mesh lysis of adhesions   HERNIA REPAIR     HOLMIUM LASER APPLICATION Right 07/14/2016   Procedure: HOLMIUM LASER APPLICATION;  Surgeon: Malen Gauze, MD;  Location: [facility];  Service: Urology;  Laterality: Right;   SPLENECTOMY  2004   TUBAL LIGATION     UPPER GASTROINTESTINAL ENDOSCOPY  12/08/2022   with colonoscopy at [facility] , Veena Nandigam, Hiatal Hernia, esophageal stricture, dilation,   VAGINAL HYSTERECTOMY N/A 01/10/2017    Procedure: HYSTERECTOMY VAGINAL W/ BILATERAL SALPINGECTOMY WITH REPAIR OF INCIDENTAL CYSTOTOMY;  Surgeon: Hermina Staggers, MD;  Location: [facility];  Service: Gynecology;  Laterality: N/A;    Family Psychiatric History: Mother heroin use (over dosed and died), sister alcoholic, sister cocaine abuse, and maternal grandmother alcohol use. Five children were born addicted to cocaine.  Family History:  Family History  Problem Relation Age of Onset   Alcohol abuse Mother    Alcohol abuse Father    Alcohol abuse Sister    Colon cancer Maternal Grandmother        colon   Diabetes Maternal Grandmother    Hypertension Maternal Grandmother    Thyroid disease Neg Hx    Stomach cancer Neg Hx    Esophageal cancer Neg Hx    Rectal cancer Neg Hx     Social History:  Social History   Socioeconomic History   Marital status: Single    Spouse name: Not on file   Number of children: 5   Years of education: Not on file   Highest education level: Not  on file  Occupational History   Not on file  Tobacco Use   Smoking status: Every Day    Current packs/day: 1.00    Average packs/day: 1 pack/day for 32.0 years (32.0 ttl pk-yrs)    Types: Cigarettes   Smokeless tobacco: Never  Vaping Use   Vaping status: Never Used  Substance and Sexual Activity   Alcohol use: Not Currently    Comment: quit 12/2012   Drug use: Not Currently    Types: "Crack" cocaine, Marijuana, Cocaine    Comment: per pt quit 12-26-2012 marijuana 2 times a week   Sexual activity: Yes    Birth control/protection: Surgical, Post-menopausal  Other Topics Concern   Not on file  Social History Narrative   Not on file   Social Drivers of Health   Financial Resource Strain: Not on file  Food Insecurity: Not on file  Transportation Needs: Not on file  Physical Activity: Not on file  Stress: Not on file  Social Connections: Not on file    Allergies: No Known Allergies  Metabolic Disorder Labs: Lab Results  Component  Value Date   HGBA1C 5.5 11/08/2022   No results found for: "PROLACTIN" Lab Results  Component Value Date   CHOL 201 (H) 11/08/2022   TRIG 57 11/08/2022   HDL 73 11/08/2022   CHOLHDL 2.8 11/08/2022   LDLCALC 118 (H) 11/08/2022   Lab Results  Component Value Date   TSH 1.275 10/04/2022    Therapeutic Level Labs: No results found for: "LITHIUM" No results found for: "VALPROATE" No results found for: "CBMZ"  Current Medications: Current Outpatient Medications  Medication Sig Dispense Refill   buPROPion (WELLBUTRIN XL) 300 MG 24 hr tablet Take 1 tablet (300 mg total) by mouth every morning. 30 tablet 3   cyclobenzaprine (FLEXERIL) 5 MG tablet Take 1 tablet (5 mg total) by mouth 3 (three) times daily as needed for muscle spasms. 30 tablet 0   divalproex (DEPAKOTE) 500 MG DR tablet Take 1 tablet (500 mg total) by mouth 3 (three) times daily. 90 tablet 3   hydrOXYzine (ATARAX) 10 MG tablet Take 1 tablet (10 mg total) by mouth 3 (three) times daily as needed. 90 tablet 3   ibuprofen (ADVIL) 800 MG tablet Take 1 tablet (800 mg total) by mouth 3 (three) times daily for 14 days. 42 tablet 0   ondansetron (ZOFRAN) 4 MG tablet Take 1 tablet (4 mg total) by mouth every 8 (eight) hours as needed for nausea or vomiting. 30 tablet 0   pantoprazole (PROTONIX) 40 MG tablet Take 1 tablet (40 mg total) by mouth 2 (two) times daily. 30 tablet 1   QUEtiapine (SEROQUEL) 50 MG tablet Take 1 tablet (50 mg total) by mouth at bedtime. 30 tablet 3   rosuvastatin (CRESTOR) 10 MG tablet Take 1 tablet (10 mg total) by mouth daily. 90 tablet 1   sucralfate (CARAFATE) 1 g tablet Take 1 tablet (1 g total) by mouth 4 (four) times daily. Take 10 mLs (1 g total) by mouth 4 (four) times daily - with meals and at bedtime. 120 tablet 0   No current facility-administered medications for this visit.     Musculoskeletal: Strength & Muscle Tone: within normal limits and Telehealth visit Gait & Station:  Unable to assess  due to telehealth visit Patient leans: N/A  Psychiatric Specialty Exam: Review of Systems  There were no vitals taken for this visit.There is no height or weight on file to calculate BMI.  General  Appearance: Well Groomed  Eye Contact:  Good  Speech:  Clear and Coherent and Normal Rate  Volume:  Normal  Mood:  Anxious and Depressed  Affect:  Appropriate and Congruent  Thought Process:  Coherent, Goal Directed and Linear  Orientation:  Full (Time, Place, and Person)  Thought Content: WDL and Logical   Suicidal Thoughts:  No  Homicidal Thoughts:  No  Memory:  Immediate;   Good Recent;   Good Remote;   Good  Judgement:  Good  Insight:  Good  Psychomotor Activity:  Normal  Concentration:  Concentration: Good and Attention Span: Good  Recall:  Good  Fund of Knowledge: Good  Language: Good  Akathisia:  No  Handed:  Right  AIMS (if indicated): Not done  Assets:  Communication Skills Desire for Improvement Financial Resources/Insurance Housing Social Support  ADL's:  Intact  Cognition: WNL  Sleep:  Good   Screenings: GAD-7    Flowsheet Row Video Visit from 03/20/2023 in [facility] Video Visit from 12/27/2022 in [facility] Video Visit from 09/27/2022 in [facility] Video Visit from 06/29/2022 in [facility] Video Visit from 04/06/2022 in [facility]  Total GAD-7 Score 19 21 12 15 9       PHQ2-9    Flowsheet Row Office Visit from 03/28/2023 in [facility] Of [place]. [facility] Video Visit from 03/20/2023 in [facility] Video Visit from 12/27/2022 in [facility] Office Visit from 12/13/2022 in [facility] - A Dept Of [place]. [facility] Office Visit from 11/08/2022 in [facility] - A Dept Of [place]. [facility]  PHQ-2 Total Score 0 2 0 0 2  PHQ-9 Total Score -- 8 5 0 10      Flowsheet Row ED from 05/11/2023 in [facility] Emergency Department at [facility] Video Visit from 03/20/2023 in [facility] ED from 01/30/2023 in [facility] Emergency Department at [facility]  C-SSRS RISK CATEGORY No Risk Error: Q7 should not be populated when Q6 is No No Risk        Assessment and Plan: Patient notes that her anxiety, depression, and mood has improved since restarting Wellbutrin and Depakote. She notes that the increased dose of Seroquel caused her to sleep 12 hours which interfered with her work schedule. Today Seroquel 100 mg reduced to 50 mg to reduce increased sedation. She will continue all other medications as prescribed.  1. Bipolar I disorder, most recent episode depressed (HCC)  Continue- buPROPion (WELLBUTRIN XL) 300 MG 24 hr tablet; Take 1 tablet (300 mg total) by mouth every morning.  Dispense: 30 tablet; Refill: 3 Continue- divalproex (DEPAKOTE) 500 MG DR tablet; Take 1 tablet (500 mg total) by mouth 3 (three) times daily.  Dispense: 90 tablet; Refill: 3 Reduced- QUEtiapine (SEROQUEL) 50 MG tablet; Take 1 tablet (50 mg total) by mouth at bedtime.  Dispense: 30 tablet; Refill: 3  2. Tobacco dependence  Continue- buPROPion (WELLBUTRIN XL) 300 MG 24 hr tablet; Take 1 tablet (300 mg total) by mouth every morning.  Dispense: 30 tablet; Refill: 3  3. GAD (generalized anxiety disorder)  Continue- hydrOXYzine (ATARAX) 10 MG tablet; Take 1 tablet (10 mg total) by mouth 3 (three) times daily as needed.  Dispense: 90 tablet; Refill: 3 Reduced-  QUEtiapine (SEROQUEL) 50 MG tablet; Take 1 tablet (50 mg total) by mouth at bedtime.  Dispense: 30 tablet; Refill: 3     Follow up in 2 months   Follow up with therapy   Shanna Cisco, NP 05/23/2023, 2:30 PM

## 2023-05-29 ENCOUNTER — Other Ambulatory Visit: Payer: Self-pay | Admitting: Student

## 2023-05-29 NOTE — Telephone Encounter (Signed)
 Copied from CRM [id number]. Topic: Clinical - Medication Refill >> May 29, 2023  2:46 PM Louie Boston wrote: Most Recent Primary Care Visit:  Provider: Modena Slater  Department: [facility]  Visit Type: OPEN ESTABLISHED  Date: 03/28/2023  Medication: ondansetron (ZOFRAN) 4 MG tablet  Has the patient contacted their pharmacy? Yes (Agent: If no, request that the patient contact the pharmacy for the refill. If patient does not wish to contact the pharmacy document the reason why and proceed with request.) (Agent: If yes, when and what did the pharmacy advise?)  Patient contacted pharmacy and was advised that she does not have any refills available and to contact provider.   Is this the correct pharmacy for this prescription? Yes If no, delete pharmacy and type the correct one.  This is the patient's preferred pharmacy:  [id number] - [place] - [address] [place] [address] Phone: [phone] Fax: [phone]  Has the prescription been filled recently? No  Is the patient out of the medication? Yes  Has the patient been seen for an appointment in the last year OR does the patient have an upcoming appointment? Yes  Can we respond through MyChart? Yes  Agent: Please be advised that Rx refills may take up to 3 business days. We ask that you follow-up with your pharmacy.

## 2023-05-31 MED ORDER — ONDANSETRON HCL 4 MG PO TABS
4.0000 mg | ORAL_TABLET | Freq: Three times a day (TID) | ORAL | 0 refills | Status: DC | PRN
Start: 2023-05-31 — End: 2023-07-05

## 2023-06-25 ENCOUNTER — Ambulatory Visit (INDEPENDENT_AMBULATORY_CARE_PROVIDER_SITE_OTHER): Payer: MEDICAID | Admitting: Endocrinology

## 2023-06-25 ENCOUNTER — Encounter: Payer: Self-pay | Admitting: Endocrinology

## 2023-06-25 VITALS — BP 130/60 | HR 65 | Resp 20 | Ht 64.0 in | Wt 137.0 lb

## 2023-06-25 DIAGNOSIS — M8589 Other specified disorders of bone density and structure, multiple sites: Secondary | ICD-10-CM

## 2023-06-25 DIAGNOSIS — E559 Vitamin D deficiency, unspecified: Secondary | ICD-10-CM

## 2023-06-25 DIAGNOSIS — E210 Primary hyperparathyroidism: Secondary | ICD-10-CM

## 2023-06-25 NOTE — Progress Notes (Unsigned)
 Outpatient Endocrinology Note [place] Jamarian Jacinto, MD  06/25/23  Patient's Name: Mary [name]    DOB: 1965-04-10    MRN: [id number]  REASON OF VISIT: Follow-up for hypercalcemia / primary hyperparathyroidism  REFERRING PROVIDER: Modena Slater, DO  PCP: Modena Slater, DO  HISTORY OF PRESENT ILLNESS:   Mary [name] is a 58 y.o. old female with past medical history listed below, is here for follow-up of hypercalcemia / primary hyperparathyroidism  Pertinent Calcium History: - Patient had CT chest, abdomen and pelvis with contrast on October 04, 2022 showed left thoracic inlet paraesophageal overall 14 mm heterogeneously enhancing soft tissue nodule which was nonenhancing on the early contrast.  CTA in 2018, suspicious for parathyroid adenoma.  Due to concern of primary hyperparathyroidism patient was referred to endocrinology for further evaluation and management, was initially seen in 11/2022.  -In 2018 patient had elevated PTH of 122 with upper normal limit of 65 however at that time she had vitamin D deficiency with vitamin D level 16.  She had normal serum calcium at that time.  Patient had intermittent mild hypercalcemia at least from 2014 in the range of 10.4-11.0 and other times with normal serum calcium.  Repeat PTH in July 2024 was normal 61 with serum calcium of 10.3.   -No history of kidney stone.  She has not been on medication that increase serum calcium including hydrochlorothiazide and lithium.  No history of fragility fracture.  Diagnosis of osteopenia, she had DEXA scan in March 2018 consistent with osteopenia with lumbar spine T-score of -1.7, T-score -1.8 in distal forearm and T-score -1.7 in left and right femoral neck. No family history of kidney stone, hyperparathyroidism or endocrine neoplasm. -Patient drinks milk almost daily. She has not been taking any multivitamin, calcium or vitamin D supplement. -Patient was evaluated for primary hyperparathyroidism/hypercalcemia in 2018 by Dr.  Lucianne Muss in this endocrinology clinic and planned for monitoring.  - CT Scan in 10/04/2022 - Mediastinum/Nodes: Left thoracic inlet, paraesophageal oval 14 mm heterogeneously enhancing soft tissue nodule which was nonenhancing on the early contrast phase CTA in 2018. This has morphology suspicious for parathyroid adenoma  # She had ultrasound thyroid in November 15, 2022 no discrete thyroid nodules however showed uniformly hypoechoic soft tissue inferior and deep to the lower left thyroid measuring 1.6 x 1.2 x 1.0 cm, could represent parathyroid adenoma/gland.  - In 11/2022: laboratory evaluation serum calcium elevated 11.1 and PTH elevated 115 with upper normal limit of 77, normal phosphorus and normal renal function.  Normal magnesium.  Overall consistent with primary hyperparathyroidism.  Patient has vitamin D level 18 is low, however in the context of hypercalcemia, no vitamin D supplement at this time.  As vitamin D is low has some component of secondary hyperparathyroidism, other labs primarily consistent of primary hyperparathyroidism.  # Inferior 20, 2025 patient had sestamibi parathyroid nuclear scan : No evidence of parathyroid adenoma on planar or SPECT imaging.    Interval history  Patient had parathyroid nuclear scan with no parathyroid adenoma on February.  Patient complains of occasional nausea with GERD symptoms.  She also complains of frequent urination.  Patient has not been taking calcium and vitamin D supplement.  She eats cream on the coffee, no excessive intake of dairy products. Patient has not completed parathyroid nuclear scan, she was not also in September 30.  I reordered.  Patient has not been calcium and vitamin D supplement.  Recent serum calcium on November 19 was 11.  Normal renal function.  She  has no other complaints today.  REVIEW OF SYSTEMS:  As per history of present illness.   PAST MEDICAL HISTORY: Past Medical History:  Diagnosis Date   Bipolar depression (HCC)     Borderline personality disorder (HCC)    COPD (chronic obstructive pulmonary disease) (HCC)    Depression    GERD (gastroesophageal reflux disease)    Hiatal hernia    History of alcohol abuse    per pt none since 10/ 2014   History of attempted suicide    History of drug abuse in remission (HCC)    per pt in remission since 12-26-2012  polysubstance dependence (alcohol, crack, cocaine, 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   History of hepatitis C per pt first dx 2009 (approx)   consulted w/ dr comer (infectious disease) note 04-26-2016 , pt states was called and told per lab work no longer has hepatitis c    History of kidney stones    Hypercalcemia    mild and long-standing--- pt asymptomatic   Hyperlipidemia    Nausea and vomiting 07/04/2011   Primary hyperparathyroidism [facility])    endocrinology --  dr Hubert Madden   Pruritic erythematous rash    lower legs   PTSD (post-traumatic stress disorder)    Right ureteral stone    Schizophrenia (HCC)    Substance abuse (HCC)    Vitamin D deficiency     PAST SURGICAL HISTORY: Past Surgical History:  Procedure Laterality Date   COLONOSCOPY  12/08/2022   at [facility] with Veena Nandigam at same time as egd   CYSTOSCOPY N/A 01/10/2017   Procedure: CYSTOSCOPY;  Surgeon: Othelia Blinks, MD;  Location: [facility];  Service: Gynecology;  Laterality: N/A;   CYSTOSCOPY W/ URETERAL STENT PLACEMENT Right 07/14/2016   Procedure: CYSTOSCOPY WITH STENT REPLACEMENT;  Surgeon: Marco Severs, MD;  Location: [facility];  Service: Urology;  Laterality: Right;   CYSTOSCOPY WITH RETROGRADE PYELOGRAM, URETEROSCOPY AND STENT PLACEMENT Right 06/28/2016   Procedure: CYSTOSCOPY WITH RETROGRADE PYELOGRAM, URETEROSCOPY AND STENT PLACEMENT;  Surgeon: Marco Severs, MD;  Location: [facility];  Service: Urology;  Laterality: Right;   CYSTOSCOPY/RETROGRADE/URETEROSCOPY/STONE EXTRACTION WITH BASKET Right 07/14/2016   Procedure:  CYSTOSCOPY/RETROGRADE/URETEROSCOPY/STONE EXTRACTION WITH BASKET;  Surgeon: Marco Severs, MD;  Location: [facility];  Service: Urology;  Laterality: Right;   DIAGNOSTIC LAPAROSCOPIC LIVER BIOPSY N/A 02/10/2013   Procedure: DIAGNOSTIC LAPAROSCOPIC ;  Surgeon: Eddye Goodie, MD;  Location: [facility];  Service: General;  Laterality: N/A;  DIAGNOSTIC LAPAROSCOPY,laparoscopic ventral hernia repair with mesh lysis of adhesions   HERNIA REPAIR     HOLMIUM LASER APPLICATION Right 07/14/2016   Procedure: HOLMIUM LASER APPLICATION;  Surgeon: Marco Severs, MD;  Location: [facility];  Service: Urology;  Laterality: Right;   SPLENECTOMY  2004   TUBAL LIGATION     UPPER GASTROINTESTINAL ENDOSCOPY  12/08/2022   with colonoscopy at [facility] , Veena Nandigam, Hiatal Hernia, esophageal stricture, dilation,   VAGINAL HYSTERECTOMY N/A 01/10/2017   Procedure: HYSTERECTOMY VAGINAL W/ BILATERAL SALPINGECTOMY WITH REPAIR OF INCIDENTAL CYSTOTOMY;  Surgeon: Othelia Blinks, MD;  Location: [facility];  Service: Gynecology;  Laterality: N/A;    ALLERGIES: No Known Allergies  FAMILY HISTORY:  Family History  Problem Relation Age of Onset   Alcohol abuse Mother    Alcohol abuse Father    Alcohol abuse Sister    Colon cancer Maternal Grandmother        colon   Diabetes  Maternal Grandmother    Hypertension Maternal Grandmother    Thyroid disease Neg Hx    Stomach cancer Neg Hx    Esophageal cancer Neg Hx    Rectal cancer Neg Hx    No known family history of hypercalcemia, kidney stone, hyperparathyroidism, or endocrine neoplasms.   SOCIAL HISTORY: Social History   Socioeconomic History   Marital status: Single    Spouse name: Not on file   Number of children: 5   Years of education: Not on file   Highest education level: Not on file  Occupational History   Not on file  Tobacco Use   Smoking status: Every Day    Current packs/day: 1.00    Average packs/day: 1  pack/day for 32.0 years (32.0 ttl pk-yrs)    Types: Cigarettes   Smokeless tobacco: Never  Vaping Use   Vaping status: Never Used  Substance and Sexual Activity   Alcohol use: Not Currently    Comment: quit 12/2012   Drug use: Not Currently    Types: "Crack" cocaine, Marijuana, Cocaine    Comment: per pt quit 12-26-2012 marijuana 2 times a week   Sexual activity: Yes    Birth control/protection: Surgical, Post-menopausal  Other Topics Concern   Not on file  Social History Narrative   Not on file   Social Drivers of Health   Financial Resource Strain: Not on file  Food Insecurity: Not on file  Transportation Needs: Not on file  Physical Activity: Not on file  Stress: Not on file  Social Connections: Not on file    MEDICATIONS:  Current Outpatient Medications  Medication Sig Dispense Refill   buPROPion (WELLBUTRIN XL) 300 MG 24 hr tablet Take 1 tablet (300 mg total) by mouth every morning. 30 tablet 3   cyclobenzaprine (FLEXERIL) 5 MG tablet Take 1 tablet (5 mg total) by mouth 3 (three) times daily as needed for muscle spasms. 30 tablet 0   divalproex (DEPAKOTE) 500 MG DR tablet Take 1 tablet (500 mg total) by mouth 3 (three) times daily. 90 tablet 3   hydrOXYzine (ATARAX) 10 MG tablet Take 1 tablet (10 mg total) by mouth 3 (three) times daily as needed. 90 tablet 3   ondansetron (ZOFRAN) 4 MG tablet Take 1 tablet (4 mg total) by mouth every 8 (eight) hours as needed for nausea or vomiting. 30 tablet 0   pantoprazole (PROTONIX) 40 MG tablet Take 1 tablet (40 mg total) by mouth 2 (two) times daily. 30 tablet 1   QUEtiapine (SEROQUEL) 50 MG tablet Take 1 tablet (50 mg total) by mouth at bedtime. 30 tablet 3   rosuvastatin (CRESTOR) 10 MG tablet Take 1 tablet (10 mg total) by mouth daily. 90 tablet 1   sucralfate (CARAFATE) 1 g tablet Take 1 tablet (1 g total) by mouth 4 (four) times daily. Take 10 mLs (1 g total) by mouth 4 (four) times daily - with meals and at bedtime. 120 tablet  0   No current facility-administered medications for this visit.    PHYSICAL EXAM: Vitals:   06/25/23 1550  BP: 130/60  Pulse: 65  Resp: 20  SpO2: 98%  Weight: 137 lb (62.1 kg)  Height: 5\' 4"  (1.626 m)     Body mass index is 23.52 kg/m.   General: Well developed, well nourished female in no apparent distress.  HEENT: AT/[place], no external lesions. Hearing intact to the spoken word Eyes: EOMI. Conjunctiva clear and no icterus. Neck: Trachea midline, neck supple without appreciable  thyromegaly or lymphadenopathy and no palpable thyroid nodules Lungs: Clear to auscultation, no wheeze. Respirations not labored Heart: S1S2, Regular in rate and rhythm.  Abdomen: Soft, non tender, non distended Neurologic: Alert, oriented, normal speech, deep tendon biceps reflexes normal,  no gross focal neurological deficit Extremities: No pedal pitting edema, no tremors of outstretched hands Skin: Warm, color good.  Psychiatric: Does not appear depressed or anxious  PERTINENT HISTORIC LABORATORY AND IMAGING STUDIES:  All pertinent laboratory results were reviewed. Please see HPI also for further details.   ASSESSMENT / PLAN  1. Primary hyperparathyroidism (HCC)   2. Osteopenia of multiple sites   3. Vitamin D deficiency      -Patient has intermittent hypercalcemia at least since 2014, calcium in the range of 10.4-11.0, although time mostly normal serum calcium.  She had elevated PTH of 122 with upper normal limit of 65 in 2018 however at that time she had vitamin D deficiency with vitamin D level of 16.  PTH was normal 61 in July 2024. -She had ultrasound neck in September 2024 and CT scan in July 2024 and 2018 : Showing hypoechoic soft tissue mass inferior and deep to the left thyroid lobe measuring 1.6 cm. -She has history of osteopenia.  No history of kidney stone.  No fragility fracture.  She has a stable and acceptable renal function. -Overall biochemically patient has primary  hyperparathyroidism with parathyroid adenoma (based on CT finding /ultrasound finding?.  Plan: -I would like to check nuclear sestamibi parathyroid scan.  Reordered. -Discussed that definitive treatment for primary hyperparathyroidism is parathyroidectomy/parathyroid surgery. -Advised not to take calcium and vitamin D supplement at this time.  It is okay to have mildly low vitamin D in the context of hypercalcemia.  Patient likes to wait a few weeks to complete test as she has new job.   Diagnoses and all orders for this visit:  Primary hyperparathyroidism (HCC) -     Renal function panel -     Parathyroid hormone, intact (no Ca) -     Calcium, 24-Hour Urine with Creatinine; Future -     DG Bone Density; Future  Osteopenia of multiple sites -     DG Bone Density; Future  Vitamin D deficiency -     VITAMIN D 25 Hydroxy (Vit-D Deficiency, Fractures)      DISPOSITION Follow up in clinic in 4 month suggested.  All questions answered and patient verbalized understanding of the plan.  [place] Bilbo Carcamo, MD [facility] Endocrinology [facility] [address] [place], [address] [place], [place] [address] Phone # [phone]  At least part of this note was generated using voice recognition software. Inadvertent word errors may have occurred, which were not recognized during the proofreading process.

## 2023-06-25 NOTE — Patient Instructions (Signed)
  24-hr Urine Collection: - Preferably to be done while staying at home and start in the morning.  - Discard the first urine of the morning after you wake up because that was the urine from overnight. DO NOT COLLECT FIRST URINE. Then begin 24-hour urine collection.  Collect your urine every time you pee for next 24 hours which means all day and overnight until next morning.  You collect the last urine next morning, that COMPLETES the collection.   Plan for bone density test.

## 2023-06-26 ENCOUNTER — Encounter: Payer: Self-pay | Admitting: Endocrinology

## 2023-06-26 LAB — RENAL FUNCTION PANEL
Albumin: 4.7 g/dL (ref 3.6–5.1)
BUN: 7 mg/dL (ref 7–25)
CO2: 29 mmol/L (ref 20–32)
Calcium: 11.2 mg/dL — ABNORMAL HIGH (ref 8.6–10.4)
Chloride: 104 mmol/L (ref 98–110)
Creat: 0.57 mg/dL (ref 0.50–1.03)
Glucose, Bld: 87 mg/dL (ref 65–99)
Phosphorus: 3.6 mg/dL (ref 2.5–4.5)
Potassium: 4.2 mmol/L (ref 3.5–5.3)
Sodium: 140 mmol/L (ref 135–146)

## 2023-06-26 LAB — VITAMIN D 25 HYDROXY (VIT D DEFICIENCY, FRACTURES): Vit D, 25-Hydroxy: 16 ng/mL — ABNORMAL LOW (ref 30–100)

## 2023-06-26 LAB — PARATHYROID HORMONE, INTACT (NO CA): PTH: 168 pg/mL — ABNORMAL HIGH (ref 16–77)

## 2023-06-27 ENCOUNTER — Telehealth: Payer: Self-pay

## 2023-06-27 NOTE — Telephone Encounter (Signed)
 Dexa scan scheduled.

## 2023-06-27 NOTE — Telephone Encounter (Signed)
 Message left for call back to schedule Dexa scan

## 2023-07-05 ENCOUNTER — Other Ambulatory Visit: Payer: Self-pay | Admitting: Student

## 2023-07-05 ENCOUNTER — Ambulatory Visit
Admission: RE | Admit: 2023-07-05 | Discharge: 2023-07-05 | Discharge status: Home / Self Care | Payer: MEDICAID | Source: Ambulatory Visit | Attending: Endocrinology

## 2023-07-05 DIAGNOSIS — E210 Primary hyperparathyroidism: Secondary | ICD-10-CM

## 2023-07-05 DIAGNOSIS — M8589 Other specified disorders of bone density and structure, multiple sites: Secondary | ICD-10-CM

## 2023-07-05 MED ORDER — ONDANSETRON HCL 4 MG PO TABS: 4.0000 mg | ORAL_TABLET | Freq: Three times a day (TID) | ORAL | 0 refills | Status: DC | PRN

## 2023-07-05 NOTE — Telephone Encounter (Signed)
 Copied from CRM [id number]. Topic: Clinical - Medication Refill >> Jul 05, 2023  9:23 AM Tisa Forester wrote: Most Recent Primary Care Visit:  Provider: Jonelle Neri  Department: [facility]  Visit Type: OPEN ESTABLISHED  Date: 03/28/2023  Medication: ondansetron  (ZOFRAN ) 4 MG tablet  Has the patient contacted their pharmacy? No (Agent: If no, request that the patient contact the pharmacy for the refill. If patient does not wish to contact the pharmacy document the reason why and proceed with request.) (Agent: If yes, when and what did the pharmacy advise?)  Is this the correct pharmacy for this prescription? Yes If no, delete pharmacy and type the correct one.  This is the patient's preferred pharmacy:  [id number] - [place] - [address] [place]) [place] [address] Phone: [phone] Fax: [phone]  [facility] - [facility] [address] [place] [address] Phone: [phone] Fax: [phone]  [place] [facility] [address] [place] [address] Phone: [phone] Fax: [phone]  [id number] [place], [place] - [address] AT [facility] [address] [place] [address] Phone: [phone] Fax: [phone]   Has the prescription been filled recently? No  Is the patient out of the medication? Yes  Has the patient been seen for an appointment in the last year OR does the patient have an upcoming appointment? Yes  Can we respond through MyChart? No  Agent: Please be advised that Rx refills may take up to 3 business days. We ask that you follow-up with your pharmacy.

## 2023-07-09 ENCOUNTER — Encounter: Payer: Self-pay | Admitting: Endocrinology

## 2023-07-09 ENCOUNTER — Telehealth: Payer: Self-pay

## 2023-07-09 NOTE — Telephone Encounter (Signed)
-----   Message from [place] Thapa sent at 07/09/2023  4:11 PM EDT ----- Please notify patient of DEXA scan reviewed with lowest T-score of -3.1 at the distal radius and T-score at lumbar spine is -3.1 overall consistent with osteoporosis.  There are has been significant decline in bone mineral density compared with DEXA scan in 2018.  24-hour urine calcium  test awaited.  She needs further testing regarding her calcium  problem.  Will plan after she complete her 24-hour urine calcium  test.  Consider treatment for her osteoporosis however will plan after evaluation and treatment of for calcium  problem.

## 2023-07-09 NOTE — Telephone Encounter (Signed)
 VM left requesting call back to give lab results as directed by MD.

## 2023-07-09 NOTE — Progress Notes (Signed)
 Please notify patient of DEXA scan reviewed with lowest T-score of -3.1 at the distal radius and T-score at lumbar spine is -3.1 overall consistent with osteoporosis.  There are has been significant decline in bone mineral density compared with DEXA scan in 2018.  24-hour urine calcium  test awaited.  She needs further testing regarding her calcium  problem.  Will plan after she complete her 24-hour urine calcium  test.  Consider treatment for her osteoporosis however will plan after evaluation and treatment of for calcium  problem.

## 2023-07-10 ENCOUNTER — Telehealth: Payer: Self-pay

## 2023-07-10 NOTE — Telephone Encounter (Signed)
 Patient given results as directed by MD. Patient agrees to treatment. MD made aware.

## 2023-07-10 NOTE — Telephone Encounter (Signed)
-----   Message from [place] Thapa sent at 07/09/2023  4:11 PM EDT ----- Please notify patient of DEXA scan reviewed with lowest T-score of -3.1 at the distal radius and T-score at lumbar spine is -3.1 overall consistent with osteoporosis.  There are has been significant decline in bone mineral density compared with DEXA scan in 2018.  24-hour urine calcium  test awaited.  She needs further testing regarding her calcium  problem.  Will plan after she complete her 24-hour urine calcium  test.  Consider treatment for her osteoporosis however will plan after evaluation and treatment of for calcium  problem.

## 2023-07-10 NOTE — Telephone Encounter (Signed)
 Will discuss about starting medication for osteoporosis at the time of follow-up visit in June.  I am waiting on test results for 24-hour urine calcium .  She has diagnosis of high calcium  problem if we treat high calcium  problem sometime bone density will improve without even medication.  This needs detail discussion with patient and further evaluation for calcium  problem and will make a plan at that time.

## 2023-07-12 ENCOUNTER — Telehealth: Payer: Self-pay | Admitting: Endocrinology

## 2023-07-12 NOTE — Telephone Encounter (Signed)
 Patient is calling to say that she took off work today to do the 24 hour urine so that she could bring it in to lab tomorrow.  Patient wants to know if she can get an excuse for work for today.  Patient states that she was told she had to bring it in right after the competed 24 hours.

## 2023-07-13 ENCOUNTER — Other Ambulatory Visit: Payer: MEDICAID

## 2023-07-14 LAB — CALCIUM, 24-HOUR URINE WITH CREATININE
CALCIUM/CREATININE RATIO: 148 mg/g{creat} (ref 30–275)
Calcium, 24H Urine: 162 mg/(24.h)
Creatinine, 24H Ur: 1.1 g/(24.h) (ref 0.50–2.15)

## 2023-07-30 ENCOUNTER — Emergency Department (HOSPITAL_COMMUNITY): Payer: MEDICAID

## 2023-07-30 ENCOUNTER — Emergency Department (HOSPITAL_COMMUNITY)
Admission: EM | Admit: 2023-07-30 | Discharge: 2023-07-30 | Disposition: A | Discharge status: Home / Self Care | Payer: MEDICAID | Attending: Emergency Medicine | Admitting: Emergency Medicine

## 2023-07-30 ENCOUNTER — Other Ambulatory Visit: Payer: Self-pay

## 2023-07-30 ENCOUNTER — Encounter (HOSPITAL_COMMUNITY): Payer: Self-pay

## 2023-07-30 DIAGNOSIS — R112 Nausea with vomiting, unspecified: Secondary | ICD-10-CM | POA: Diagnosis not present

## 2023-07-30 DIAGNOSIS — R519 Headache, unspecified: Secondary | ICD-10-CM | POA: Insufficient documentation

## 2023-07-30 DIAGNOSIS — R42 Dizziness and giddiness: Secondary | ICD-10-CM | POA: Diagnosis not present

## 2023-07-30 LAB — COMPREHENSIVE METABOLIC PANEL WITH GFR
ALT: 17 U/L (ref 0–44)
AST: 19 U/L (ref 15–41)
Albumin: 3.9 g/dL (ref 3.5–5.0)
Alkaline Phosphatase: 92 U/L (ref 38–126)
Anion gap: 7 (ref 5–15)
BUN: 8 mg/dL (ref 6–20)
CO2: 25 mmol/L (ref 22–32)
Calcium: 10.4 mg/dL — ABNORMAL HIGH (ref 8.9–10.3)
Chloride: 107 mmol/L (ref 98–111)
Creatinine, Ser: 0.58 mg/dL (ref 0.44–1.00)
GFR, Estimated: >60 mL/min (ref >60)
Glucose, Bld: 105 mg/dL — ABNORMAL HIGH (ref 70–99)
Potassium: 3.7 mmol/L (ref 3.5–5.1)
Sodium: 139 mmol/L (ref 135–145)
Total Bilirubin: 0.8 mg/dL (ref 0.0–1.2)
Total Protein: 7 g/dL (ref 6.5–8.1)

## 2023-07-30 LAB — CBC
HCT: 40.2 % (ref 36.0–46.0)
Hemoglobin: 13.5 g/dL (ref 12.0–15.0)
MCH: 31.5 pg (ref 26.0–34.0)
MCHC: 33.6 g/dL (ref 30.0–36.0)
MCV: 93.7 fL (ref 80.0–100.0)
Platelets: 272 10*3/uL (ref 150–400)
RBC: 4.29 MIL/uL (ref 3.87–5.11)
RDW: 12.9 % (ref 11.5–15.5)
WBC: 9.9 10*3/uL (ref 4.0–10.5)
nRBC: 0 % (ref 0.0–0.2)

## 2023-07-30 LAB — VALPROIC ACID LEVEL: Valproic Acid Lvl: <10 ug/mL — ABNORMAL LOW (ref 50–100)

## 2023-07-30 MED ORDER — SODIUM CHLORIDE 0.9 % IV BOLUS
500.0000 mL | Freq: Once | INTRAVENOUS | Status: AC
  Administered 2023-07-30: 500 mL via INTRAVENOUS

## 2023-07-30 MED ORDER — KETOROLAC TROMETHAMINE 15 MG/ML IJ SOLN
15.0000 mg | Freq: Once | INTRAMUSCULAR | Status: AC
  Administered 2023-07-30: 15 mg via INTRAVENOUS
  Filled 2023-07-30: qty 1

## 2023-07-30 MED ORDER — PROCHLORPERAZINE MALEATE 10 MG PO TABS: 10.0000 mg | ORAL_TABLET | Freq: Two times a day (BID) | ORAL | 0 refills | Status: DC | PRN

## 2023-07-30 MED ORDER — PROCHLORPERAZINE EDISYLATE 10 MG/2ML IJ SOLN
5.0000 mg | Freq: Once | INTRAMUSCULAR | Status: AC
  Administered 2023-07-30: 5 mg via INTRAVENOUS
  Filled 2023-07-30: qty 2

## 2023-07-30 NOTE — ED Triage Notes (Signed)
 Pt arrives via POV. Pt reports she began experiencing a headache, blurred vision, nausea, vomiting, and dizziness since 1500 yesterday. Pt is AxOx4. No other focal neurological deficits noted during triage. Pt not on any blood thinners.

## 2023-07-30 NOTE — ED Provider Notes (Signed)
 [facility] AT [facility] Provider Note   CSN: [id number] Arrival date & time: 07/30/23  1004     History  Chief Complaint  Patient presents with   Blurred Vision   Headache   Dizziness    Mary [name] is a 58 y.o. female.   Headache Associated symptoms: dizziness   Dizziness Associated symptoms: headaches   Presents with headache and dizziness.  Has had for the last 2 days.  States she does get a headache every morning that goes away when she takes caffeine .  No fevers or chills.  Has had some nausea and vomiting.  States dizzy.  States lightheaded with standing.  Felt as if she could pass out.     Home Medications Prior to Admission medications   Medication Sig Start Date End Date Taking? Authorizing Provider  prochlorperazine  (COMPAZINE ) 10 MG tablet Take 1 tablet (10 mg total) by mouth 2 (two) times daily as needed for nausea or vomiting. 07/30/23  Yes Mozell Arias, MD  buPROPion  (WELLBUTRIN  XL) 300 MG 24 hr tablet Take 1 tablet (300 mg total) by mouth every morning. 05/23/23   Arlyne Bering, NP  cyclobenzaprine  (FLEXERIL ) 5 MG tablet Take 1 tablet (5 mg total) by mouth 3 (three) times daily as needed for muscle spasms. 03/28/23   Jonelle Neri, DO  divalproex  (DEPAKOTE ) 500 MG DR tablet Take 1 tablet (500 mg total) by mouth 3 (three) times daily. 05/23/23   Arlyne Bering, NP  hydrOXYzine  (ATARAX ) 10 MG tablet Take 1 tablet (10 mg total) by mouth 3 (three) times daily as needed. 05/23/23   Arlyne Bering, NP  ondansetron  (ZOFRAN ) 4 MG tablet Take 1 tablet (4 mg total) by mouth every 8 (eight) hours as needed for nausea or vomiting. 07/05/23   Jonelle Neri, DO  pantoprazole  (PROTONIX ) 40 MG tablet Take 1 tablet (40 mg total) by mouth 2 (two) times daily. 12/13/22 12/13/23  Tawkaliyar, Roya, DO  QUEtiapine  (SEROQUEL ) 50 MG tablet Take 1 tablet (50 mg total) by mouth at bedtime. 05/23/23   Arlyne Bering, NP  rosuvastatin   (CRESTOR ) 10 MG tablet Take 1 tablet (10 mg total) by mouth daily. 03/28/23 03/27/24  Jonelle Neri, DO  sucralfate  (CARAFATE ) 1 g tablet Take 1 tablet (1 g total) by mouth 4 (four) times daily. Take 10 mLs (1 g total) by mouth 4 (four) times daily - with meals and at bedtime. 12/13/22 01/12/23  Tawkaliyar, Roya, DO  fluticasone -salmeterol (ADVAIR HFA) 230-21 MCG/ACT inhaler Inhale 2 puffs into the lungs 2 (two) times daily. Rinse mouth after each use Patient not taking: Reported on 02/14/2017 07/04/16 07/25/19  Nche, Connye Delaine, NP      Allergies    Patient has no known allergies.    Review of Systems   Review of Systems  Neurological:  Positive for dizziness and headaches.    Physical Exam Updated Vital Signs BP 109/71 (BP Location: Left Arm)   Pulse 73   Temp 98 F (36.7 C) (Oral)   Resp 18   Ht 5' 4.5" (1.638 m)   Wt 59.9 kg   LMP  (LMP Unknown)   SpO2 99%   BMI 22.31 kg/m  Physical Exam Vitals and nursing note reviewed.  Constitutional:      Appearance: She is well-developed.  HENT:     Head: Atraumatic.  Eyes:     Extraocular Movements: Extraocular movements intact.     Pupils: Pupils are equal, round, and reactive to light.  Cardiovascular:     Rate and Rhythm: Normal rate.  Skin:    General: Skin is warm.  Neurological:     Mental Status: She is alert and oriented to person, place, and time.     Comments: Eye movements tact.  No nystagmus.  Finger-nose and heel shin intact bilaterally.  Awake and appropriate.     ED Results / Procedures / Treatments   Labs (all labs ordered are listed, but only abnormal results are displayed) Labs Reviewed  COMPREHENSIVE METABOLIC PANEL WITH GFR - Abnormal; Notable for the following components:      Result Value   Glucose, Bld 105 (*)    Calcium  10.4 (*)    All other components within normal limits  VALPROIC ACID  LEVEL - Abnormal; Notable for the following components:   Valproic Acid  Lvl <10 (*)    All other components  within normal limits  CBC  URINALYSIS, ROUTINE W REFLEX MICROSCOPIC  CBG MONITORING, ED    EKG None  Radiology CT Head Wo Contrast Result Date: 07/30/2023 CLINICAL DATA:  58 year old female with headache, blurred vision, nausea vomiting and dizziness since 1500 hours yesterday. EXAM: CT HEAD WITHOUT CONTRAST TECHNIQUE: Contiguous axial images were obtained from the base of the skull through the vertex without intravenous contrast. RADIATION DOSE REDUCTION: This exam was performed according to the departmental dose-optimization program which includes automated exposure control, adjustment of the mA and/or kV according to patient size and/or use of iterative reconstruction technique. COMPARISON:  None Available. FINDINGS: Brain: Normal cerebral volume. No midline shift, ventriculomegaly, mass effect, evidence of mass lesion, intracranial hemorrhage or evidence of cortically based acute infarction. Gray-white matter differentiation is within normal limits throughout the brain. Asymmetric faint basal ganglia vascular calcifications greater on the right. Vascular: No suspicious intracranial vascular hyperdensity. Mild Calcified atherosclerosis at the skull base. Skull: Intact, negative. Sinuses/Orbits: Tympanic cavities, visualized paranasal sinuses and mastoids are clear. Other: No gaze deviation. Visualized orbits and scalp soft tissues are within normal limits. IMPRESSION: Normal for age noncontrast Head CT. Electronically Signed   By: Marlise Simpers M.D.   On: 07/30/2023 12:53    Procedures Procedures    Medications Ordered in ED Medications  ketorolac  (TORADOL ) 15 MG/ML injection 15 mg (15 mg Intravenous Given 07/30/23 1048)  prochlorperazine  (COMPAZINE ) injection 5 mg (5 mg Intravenous Given 07/30/23 1049)  sodium chloride  0.9 % bolus 500 mL (0 mLs Intravenous Stopped 07/30/23 1047)    ED Course/ Medical Decision Making/ A&P                                 Medical Decision Making Amount and/or  Complexity of Data Reviewed Labs: ordered. Radiology: ordered.  Risk Prescription drug management.   Patient with headache.  Somewhat acute onset.  Does have some chronic headaches.  States she does have some memory issues but that has been chronic.  States she feels dizzy.  With new headache will get CT scan to evaluate.  Will get basic blood work and treat as potential migraine for now.  Blood work reassuring.  Head CT negative.  Feels better after treatment.  Will discharge home.  Will give antiemetics but follow-up with PCP.        Final Clinical Impression(s) / ED Diagnoses Final diagnoses:  Acute nonintractable headache, unspecified headache type    Rx / DC Orders ED Discharge Orders          Ordered  prochlorperazine  (COMPAZINE ) 10 MG tablet  2 times daily PRN        07/30/23 1411              Mozell Arias, MD 07/30/23 1620

## 2023-07-30 NOTE — ED Notes (Signed)
Informed pt of need for urine. 

## 2023-07-31 ENCOUNTER — Telehealth (INDEPENDENT_AMBULATORY_CARE_PROVIDER_SITE_OTHER): Payer: MEDICAID | Admitting: Psychiatry

## 2023-07-31 ENCOUNTER — Encounter (HOSPITAL_COMMUNITY): Payer: Self-pay | Admitting: Psychiatry

## 2023-07-31 DIAGNOSIS — F411 Generalized anxiety disorder: Secondary | ICD-10-CM

## 2023-07-31 DIAGNOSIS — F17200 Nicotine dependence, unspecified, uncomplicated: Secondary | ICD-10-CM

## 2023-07-31 DIAGNOSIS — F319 Bipolar disorder, unspecified: Secondary | ICD-10-CM | POA: Diagnosis not present

## 2023-07-31 DIAGNOSIS — F17210 Nicotine dependence, cigarettes, uncomplicated: Secondary | ICD-10-CM | POA: Diagnosis not present

## 2023-07-31 DIAGNOSIS — F3130 Bipolar disorder, current episode depressed, mild or moderate severity, unspecified: Secondary | ICD-10-CM

## 2023-07-31 MED ORDER — QUETIAPINE FUMARATE 50 MG PO TABS
50.0000 mg | ORAL_TABLET | Freq: Every day | ORAL | 3 refills | Status: DC
Start: 2023-07-31 — End: 2023-11-03

## 2023-07-31 MED ORDER — VORTIOXETINE HBR 10 MG PO TABS: 10.0000 mg | ORAL_TABLET | Freq: Every day | ORAL | 3 refills | Status: DC

## 2023-07-31 MED ORDER — DIVALPROEX SODIUM 500 MG PO DR TAB: 500.0000 mg | DELAYED_RELEASE_TABLET | Freq: Three times a day (TID) | ORAL | 3 refills | Status: DC

## 2023-07-31 MED ORDER — HYDROXYZINE HCL 25 MG PO TABS
25.0000 mg | ORAL_TABLET | Freq: Three times a day (TID) | ORAL | 3 refills | Status: DC | PRN
Start: 2023-07-31 — End: 2023-11-03

## 2023-07-31 MED ORDER — NICOTINE 21 MG/24HR TD PT24: 21.0000 mg | MEDICATED_PATCH | Freq: Every day | TRANSDERMAL | 3 refills | Status: DC

## 2023-07-31 NOTE — Progress Notes (Signed)
 [facility] MD/PA/NP OP Progress Note Virtual Visit via Video Note  I connected with Mary [name] on 07/31/23 at  3:30 PM EDT by a video enabled telemedicine application and verified that I am speaking with the correct person using two identifiers.  Location: Patient: Home Provider: Clinic   I discussed the limitations of evaluation and management by telemedicine and the availability of in person appointments. The patient expressed understanding and agreed to proceed.  I provided 30 minutes of non-face-to-face time during this encounter.       07/31/2023 4:24 PM Lashya Passe  MRN:  [id number]  Chief Complaint:   "Everything feels like a task"  HPI: 58 year old female seen today for follow up psychiatric evaluation. 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 She is currently being managed on hydroxyzine  10 mg three times daily as needed, Depakote  500 mg three times daily, and  Seroquel  50 mg nightly.  She informed Clinical research associate that her medications are somewhat effective in managing her psychiatric conditions.  Today she was well groomed, pleasant, cooperative, and engaged in conversation. She informed Clinical research associate that everything has been a task.  She reports that she finds life difficult at times.  She continues to work at [employer] but reports that at times work is stressful although she enjoys her job.  Patient notes that at times her mood fluctuates and she is irritable.  She also notes recently she has been more anxious and depressed.  Patient unable to identify a trigger for her anxiety.  Provider conducted a GAD-7 and patient scored 20.  Provider also conducted PHQ-9 and patient scored a 16.  She endorses adequate sleep but notes that it is somewhat impacted because she has a guest in her home.  Patient  reported that her appetite is poor.  She denies SI/HI/VAH, mania or paranoia.    To cope patient notes that she smokes tobacco.  Today patient agreeable to starting Trintellix 10 mg daily to help manage anxiety and depression.  Hydroxyzine  increased from 10 mg 3 times daily to 25 mg 3 times daily as needed to help manage anxiety.  Patient started on Nicorette CQ 21 mg patches to help manage tobacco dependence.  She will continue all other medications as prescribed. Potential side effects of medication and risks vs benefits of treatment vs non-treatment were explained and discussed. All questions were answered.  No other concerns at this time.   Visit Diagnosis:    ICD-10-CM   1. Tobacco dependence  F17.200     2. GAD (generalized anxiety disorder)  F41.1 vortioxetine HBr (TRINTELLIX) 10 MG TABS tablet    hydrOXYzine  (ATARAX ) 25 MG tablet    QUEtiapine  (SEROQUEL ) 50 MG tablet    3. Bipolar I disorder, most recent episode depressed (HCC)  F31.30 QUEtiapine  (SEROQUEL ) 50 MG tablet    divalproex  (DEPAKOTE ) 500 MG DR tablet           Past Psychiatric History: cocaine dependence (in remission), marijuana dependence (in remission), borderline, schizophrenia substance induced mood disorder, PTSD, and poly substance use  Past Medical History:  Past Medical History:  Diagnosis Date   Bipolar depression (HCC)    Borderline personality disorder (HCC)    COPD (chronic obstructive pulmonary disease) (HCC)    Depression    GERD (gastroesophageal reflux disease)    Hiatal hernia    History of alcohol  abuse    per pt none since 10/ 2014   History of attempted suicide    History of drug abuse in remission (HCC)    per pt in remission since 12-26-2012  polysubstance dependence (alcohol, crack, cocaine, 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   History of hepatitis C per pt first dx 2009 (approx)   consulted w/ dr comer (infectious disease) note 04-26-2016 , pt states was called and told per  lab work no longer has hepatitis c    History of kidney stones    Hypercalcemia    mild and long-standing--- pt asymptomatic   Hyperlipidemia    Nausea and vomiting 07/04/2011   Primary hyperparathyroidism [facility])    endocrinology --  dr Hubert Madden   Pruritic erythematous rash    lower legs   PTSD (post-traumatic stress disorder)    Right ureteral stone    Schizophrenia (HCC)    Substance abuse (HCC)    Vitamin D  deficiency     Past Surgical History:  Procedure Laterality Date   COLONOSCOPY  12/08/2022   at [facility] with Veena Nandigam at same time as egd   CYSTOSCOPY N/A 01/10/2017   Procedure: CYSTOSCOPY;  Surgeon: Othelia Blinks, MD;  Location: [facility];  Service: Gynecology;  Laterality: N/A;   CYSTOSCOPY W/ URETERAL STENT PLACEMENT Right 07/14/2016   Procedure: CYSTOSCOPY WITH STENT REPLACEMENT;  Surgeon: Marco Severs, MD;  Location: [facility];  Service: Urology;  Laterality: Right;   CYSTOSCOPY WITH RETROGRADE PYELOGRAM, URETEROSCOPY AND STENT PLACEMENT Right 06/28/2016   Procedure: CYSTOSCOPY WITH RETROGRADE PYELOGRAM, URETEROSCOPY AND STENT PLACEMENT;  Surgeon: Marco Severs, MD;  Location: [facility];  Service: Urology;  Laterality: Right;   CYSTOSCOPY/RETROGRADE/URETEROSCOPY/STONE EXTRACTION WITH BASKET Right 07/14/2016   Procedure: CYSTOSCOPY/RETROGRADE/URETEROSCOPY/STONE EXTRACTION WITH BASKET;  Surgeon: Marco Severs, MD;  Location: [facility];  Service: Urology;  Laterality: Right;   DIAGNOSTIC LAPAROSCOPIC LIVER BIOPSY N/A 02/10/2013   Procedure: DIAGNOSTIC LAPAROSCOPIC ;  Surgeon: Eddye Goodie, MD;  Location: [facility];  Service: General;  Laterality: N/A;  DIAGNOSTIC LAPAROSCOPY,laparoscopic ventral hernia repair with mesh lysis of adhesions   HERNIA REPAIR     HOLMIUM LASER APPLICATION Right 07/14/2016   Procedure: HOLMIUM LASER APPLICATION;  Surgeon: Marco Severs, MD;  Location: [facility];  Service: Urology;   Laterality: Right;   SPLENECTOMY  2004   TUBAL LIGATION     UPPER GASTROINTESTINAL ENDOSCOPY  12/08/2022   with colonoscopy at [facility] , Veena Nandigam, Hiatal Hernia, esophageal stricture, dilation,   VAGINAL HYSTERECTOMY N/A 01/10/2017   Procedure: HYSTERECTOMY VAGINAL W/ BILATERAL SALPINGECTOMY WITH REPAIR OF INCIDENTAL CYSTOTOMY;  Surgeon: Othelia Blinks, MD;  Location: [facility];  Service: Gynecology;  Laterality: N/A;    Family Psychiatric History: Mother heroin use (over dosed and died), sister alcoholic, sister cocaine abuse, and maternal grandmother alcohol use. Five children were born addicted to cocaine.  Family History:  Family History  Problem Relation Age of Onset   Alcohol abuse Mother    Alcohol abuse Father    Alcohol abuse Sister    Colon cancer Maternal Grandmother        colon   Diabetes Maternal Grandmother    Hypertension Maternal Grandmother    Thyroid  disease Neg Hx    Stomach cancer Neg Hx    Esophageal cancer Neg Hx    Rectal cancer Neg Hx     Social History:  Social History   Socioeconomic History  Marital status: Single    Spouse name: Not on file   Number of children: 5   Years of education: Not on file   Highest education level: Not on file  Occupational History   Not on file  Tobacco Use   Smoking status: Every Day    Current packs/day: 1.00    Average packs/day: 1 pack/day for 32.0 years (32.0 ttl pk-yrs)    Types: Cigarettes   Smokeless tobacco: Never  Vaping Use   Vaping status: Never Used  Substance and Sexual Activity   Alcohol use: Not Currently    Comment: quit 12/2012   Drug use: Not Currently    Types: "Crack" cocaine, Marijuana, Cocaine    Comment: per pt quit 12-26-2012 marijuana 2 times a week   Sexual activity: Yes    Birth control/protection: Surgical, Post-menopausal  Other Topics Concern   Not on file  Social History Narrative   Not on file   Social Drivers of Health   Financial Resource Strain: Not on file  Food  Insecurity: Not on file  Transportation Needs: Not on file  Physical Activity: Not on file  Stress: Not on file  Social Connections: Not on file    Allergies: No Known Allergies  Metabolic Disorder Labs: Lab Results  Component Value Date   HGBA1C 5.5 11/08/2022   No results found for: "PROLACTIN" Lab Results  Component Value Date   CHOL 201 (H) 11/08/2022   TRIG 57 11/08/2022   HDL 73 11/08/2022   CHOLHDL 2.8 11/08/2022   LDLCALC 118 (H) 11/08/2022   Lab Results  Component Value Date   TSH 1.275 10/04/2022    Therapeutic Level Labs: No results found for: "LITHIUM" Lab Results  Component Value Date   VALPROATE <10 (L) 07/30/2023   No results found for: "CBMZ"  Current Medications: Current Outpatient Medications  Medication Sig Dispense Refill   nicotine  (NICODERM CQ ) 21 mg/24hr patch Place 1 patch (21 mg total) onto the skin daily. 28 patch 3   vortioxetine HBr (TRINTELLIX) 10 MG TABS tablet Take 1 tablet (10 mg total) by mouth daily. 30 tablet 3   cyclobenzaprine  (FLEXERIL ) 5 MG tablet Take 1 tablet (5 mg total) by mouth 3 (three) times daily as needed for muscle spasms. 30 tablet 0   divalproex  (DEPAKOTE ) 500 MG DR tablet Take 1 tablet (500 mg total) by mouth 3 (three) times daily. 90 tablet 3   hydrOXYzine  (ATARAX ) 25 MG tablet Take 1 tablet (25 mg total) by mouth 3 (three) times daily as needed. 90 tablet 3   ondansetron  (ZOFRAN ) 4 MG tablet Take 1 tablet (4 mg total) by mouth every 8 (eight) hours as needed for nausea or vomiting. 30 tablet 0   pantoprazole  (PROTONIX ) 40 MG tablet Take 1 tablet (40 mg total) by mouth 2 (two) times daily. 30 tablet 1   prochlorperazine  (COMPAZINE ) 10 MG tablet Take 1 tablet (10 mg total) by mouth 2 (two) times daily as needed for nausea or vomiting. 10 tablet 0   QUEtiapine  (SEROQUEL ) 50 MG tablet Take 1 tablet (50 mg total) by mouth at bedtime. 30 tablet 3   rosuvastatin  (CRESTOR ) 10 MG tablet Take 1 tablet (10 mg total) by mouth  daily. 90 tablet 1   sucralfate  (CARAFATE ) 1 g tablet Take 1 tablet (1 g total) by mouth 4 (four) times daily. Take 10 mLs (1 g total) by mouth 4 (four) times daily - with meals and at bedtime. 120 tablet 0   No current  facility-administered medications for this visit.     Musculoskeletal: Strength & Muscle Tone: within normal limits and Telehealth visit Gait & Station: normal,  telehealth visit Patient leans: N/A  Psychiatric Specialty Exam: Review of Systems  There were no vitals taken for this visit.There is no height or weight on file to calculate BMI.  General Appearance: Well Groomed  Eye Contact:  Good  Speech:  Clear and Coherent and Normal Rate  Volume:  Normal  Mood:  Anxious and Depressed  Affect:  Appropriate and Congruent  Thought Process:  Coherent, Goal Directed and Linear  Orientation:  Full (Time, Place, and Person)  Thought Content: WDL and Logical   Suicidal Thoughts:  No  Homicidal Thoughts:  No  Memory:  Immediate;   Good Recent;   Good Remote;   Good  Judgement:  Good  Insight:  Good  Psychomotor Activity:  Normal  Concentration:  Concentration: Good and Attention Span: Good  Recall:  Good  Fund of Knowledge: Good  Language: Good  Akathisia:  No  Handed:  Right  AIMS (if indicated): Not done  Assets:  Communication Skills Desire for Improvement Financial Resources/Insurance Housing Social Support  ADL's:  Intact  Cognition: WNL  Sleep:  Good   Screenings: GAD-7    Flowsheet Row Video Visit from 07/31/2023 in [facility] Video Visit from 03/20/2023 in [facility] Video Visit from 12/27/2022 in [facility] Video Visit from 09/27/2022 in [facility] Video Visit from 06/29/2022 in [facility]  Total GAD-7 Score 20 19 21 12 15       PHQ2-9    Flowsheet Row Video Visit from 07/31/2023 in [facility] Office Visit from 03/28/2023 in [facility] [place] Video Visit from 03/20/2023 in [facility] Video Visit from 12/27/2022 in [facility] Office Visit from 12/13/2022 in [facility] Dept Of [facility]  PHQ-2 Total Score 4 0 2 0 0  PHQ-9 Total Score 16 -- 8 5 0      Flowsheet Row Video Visit from 07/31/2023 in [facility] ED from 07/30/2023 in [facility] Emergency Department at [facility] ED from 05/11/2023 in [facility] Emergency Department at [facility]  C-SSRS RISK CATEGORY Error: Q3, 4, or 5 should not be populated when Q2 is No No Risk No Risk        Assessment and Plan: Patient notes that her anxiety, depression, and mood has is problematic. She notes that she finds everything as a task. Today patient agreeable to starting Trintellix 10 mg daily to help manage anxiety and depression.  Hydroxyzine  increased from 10 mg 3 times daily to 25 mg 3 times daily as needed to help manage anxiety.  Patient started on Nicorette CQ 21 mg patches to help manage tobacco dependence.  She will continue all other medications as prescribed.   1. GAD (generalized anxiety disorder)  Start- vortioxetine HBr (TRINTELLIX) 10 MG TABS tablet; Take 1 tablet (10 mg total) by mouth daily.  Dispense: 30 tablet; Refill: 3 Increased- hydrOXYzine  (ATARAX ) 25 MG tablet; Take 1 tablet (25 mg total) by mouth 3 (three) times daily as needed.  Dispense: 90 tablet; Refill: 3 Continue- QUEtiapine  (SEROQUEL ) 50 MG tablet; Take 1 tablet (50 mg total) by mouth  at bedtime.  Dispense: 30 tablet; Refill: 3  2. Bipolar I disorder, most recent episode depressed (HCC)  Continue- QUEtiapine  (SEROQUEL ) 50 MG tablet; Take 1 tablet (50 mg total) by mouth at bedtime.  Dispense: 30 tablet; Refill: 3 Continue- divalproex   (DEPAKOTE ) 500 MG DR tablet; Take 1 tablet (500 mg total) by mouth 3 (three) times daily.  Dispense: 90 tablet; Refill: 3  3. Tobacco dependence (Primary)  Start- nicotine  (NICODERM CQ ) 21 mg/24hr patch; Place 1 patch (21 mg total) onto the skin daily.  Dispense: 28 patch; Refill: 3     Follow up in 2 months   Follow up with therapy   Arlyne Bering, NP 07/31/2023, 4:24 PM

## 2023-08-22 ENCOUNTER — Ambulatory Visit: Payer: MEDICAID | Admitting: Endocrinology

## 2023-08-22 ENCOUNTER — Encounter: Payer: Self-pay | Admitting: Endocrinology

## 2023-08-22 VITALS — BP 130/70 | HR 70 | Resp 20 | Ht 64.5 in | Wt 133.2 lb

## 2023-08-22 DIAGNOSIS — E210 Primary hyperparathyroidism: Secondary | ICD-10-CM

## 2023-08-22 DIAGNOSIS — K219 Gastro-esophageal reflux disease without esophagitis: Secondary | ICD-10-CM

## 2023-08-22 DIAGNOSIS — E559 Vitamin D deficiency, unspecified: Secondary | ICD-10-CM | POA: Diagnosis not present

## 2023-08-22 DIAGNOSIS — M818 Other osteoporosis without current pathological fracture: Secondary | ICD-10-CM

## 2023-08-22 MED ORDER — PANTOPRAZOLE SODIUM 40 MG PO TBEC: 40.0000 mg | DELAYED_RELEASE_TABLET | Freq: Every day | ORAL | 3 refills | Status: DC

## 2023-08-22 NOTE — Patient Instructions (Signed)
 Labs today.  Will plan for additional CT scan test for parathyroid  gland.  I have sent prescription for pantoprazole  for your acid reflux problem.

## 2023-08-22 NOTE — Progress Notes (Signed)
 Outpatient Endocrinology Note [place] Jasen Hartstein, MD  08/24/23  Patient's Name: Mary [name]    DOB: 07-13-1965    MRN: [id number]  REASON OF VISIT: Follow-up for hypercalcemia / primary hyperparathyroidism  REFERRING PROVIDER: Jonelle Neri, DO  PCP: Jonelle Neri, DO  HISTORY OF PRESENT ILLNESS:   Mary [name] is a 58 y.o. old female with past medical history listed below, is here for follow-up of hypercalcemia / primary hyperparathyroidism  Pertinent Calcium  History: - Patient had CT chest, abdomen and pelvis with contrast on October 04, 2022 showed left thoracic inlet paraesophageal overall 14 mm heterogeneously enhancing soft tissue nodule which was nonenhancing on the early contrast.  CTA in 2018, suspicious for parathyroid  adenoma.  Due to concern of primary hyperparathyroidism patient was referred to endocrinology for further evaluation and management, was initially seen in 11/2022.  -In 2018 patient had elevated PTH of 122 with upper normal limit of 65 however at that time she had vitamin D  deficiency with vitamin D  level 16.  She had normal serum calcium  at that time.  Patient had intermittent mild hypercalcemia at least from 2014 in the range of 10.4-11.0 and other times with normal serum calcium .  Repeat PTH in July 2024 was normal 61 with serum calcium  of 10.3.   -No history of kidney stone.  She has not been on medication that increase serum calcium  including hydrochlorothiazide and lithium.  No history of fragility fracture.  Diagnosis of osteopenia, she had DEXA scan in March 2018 consistent with osteopenia with lumbar spine T-score of -1.7, T-score -1.8 in distal forearm and T-score -1.7 in left and right femoral neck. No family history of kidney stone, hyperparathyroidism or endocrine neoplasm. -Patient drinks milk almost daily. She has not been taking any multivitamin, calcium  or vitamin D  supplement. -Patient was evaluated for primary hyperparathyroidism/hypercalcemia in 2018 by Dr.  Hubert Madden in this endocrinology clinic and planned for monitoring.  - CT Scan in 10/04/2022 - Mediastinum/Nodes: Left thoracic inlet, paraesophageal oval 14 mm heterogeneously enhancing soft tissue nodule which was nonenhancing on the early contrast phase CTA in 2018. This has morphology suspicious for parathyroid  adenoma  # She had ultrasound thyroid  in November 15, 2022 no discrete thyroid  nodules however showed uniformly hypoechoic soft tissue inferior and deep to the lower left thyroid  measuring 1.6 x 1.2 x 1.0 cm, could represent parathyroid  adenoma/gland.  - In 11/2022: laboratory evaluation serum calcium  elevated 11.1 and PTH elevated 115 with upper normal limit of 77, normal phosphorus and normal renal function.  Normal magnesium .  Overall consistent with primary hyperparathyroidism.  Patient has vitamin D  level 18 is low, however in the context of hypercalcemia, no vitamin D  supplement at this time.  As vitamin D  is low has some component of secondary hyperparathyroidism, other labs primarily consistent of primary hyperparathyroidism.  # May 03, 2023 patient had sestamibi parathyroid  nuclear scan : No evidence of parathyroid  adenoma on planar or SPECT imaging.   24-hour urine calcium  is normal 162.  # Osteoporosis. : - DEXA scan in July 09, 2023 consistent with osteoporosis with lowest T-score of -2.4.  Distal radius, -2.18 lumbar spine L1-L4.  Results:   Lumbar spine L1-L4 Femoral neck (FN) 33% distal radius  T-score   -3.1 RFN: -2.3 LFN: -2.5 -3.4  Change in BMD from previous DXA test (%) Down 17.3%* Down 12.9%* Down 19.8%*  (*) statistically significant   Interval history  Patient had DEXA scan in April consistent with osteoporosis.  She had 24 urine calcium  last month normal.  Patient continued complaints of GERD symptoms.  She is a smoker is smoked about 1 pack/day has been is smoking since her teen age.  Discussed about quitting smoking, she is not ready at this time.  No fall  and fracture.  She reports she has received steroid injection into her lower back for 2 times in last 1 year.  She had menopause around the age of early 20s.  She had a check to me in 2018.  REVIEW OF SYSTEMS:  As per history of present illness.   PAST MEDICAL HISTORY: Past Medical History:  Diagnosis Date   Bipolar depression (HCC)    Borderline personality disorder (HCC)    COPD (chronic obstructive pulmonary disease) (HCC)    Depression    GERD (gastroesophageal reflux disease)    Hiatal hernia    History of alcohol abuse    per pt none since 10/ 2014   History of attempted suicide    History of drug abuse in remission (HCC)    per pt in remission since 12-26-2012  polysubstance dependence (alcohol, crack, cocaine, 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   History of hepatitis C per pt first dx 2009 (approx)   consulted w/ dr comer (infectious disease) note 04-26-2016 , pt states was called and told per lab work no longer has hepatitis c    History of kidney stones    Hypercalcemia    mild and long-standing--- pt asymptomatic   Hyperlipidemia    Nausea and vomiting 07/04/2011   Primary hyperparathyroidism [facility])    endocrinology --  dr Hubert Madden   Pruritic erythematous rash    lower legs   PTSD (post-traumatic stress disorder)    Right ureteral stone    Schizophrenia (HCC)    Substance abuse (HCC)    Vitamin D  deficiency     PAST SURGICAL HISTORY: Past Surgical History:  Procedure Laterality Date   COLONOSCOPY  12/08/2022   at [facility] with Veena Nandigam at same time as egd   CYSTOSCOPY N/A 01/10/2017   Procedure: CYSTOSCOPY;  Surgeon: Othelia Blinks, MD;  Location: [facility];  Service: Gynecology;  Laterality: N/A;   CYSTOSCOPY W/ URETERAL STENT PLACEMENT Right 07/14/2016   Procedure: CYSTOSCOPY WITH STENT REPLACEMENT;  Surgeon: Marco Severs, MD;  Location: [facility];  Service: Urology;  Laterality: Right;   CYSTOSCOPY WITH RETROGRADE PYELOGRAM,  URETEROSCOPY AND STENT PLACEMENT Right 06/28/2016   Procedure: CYSTOSCOPY WITH RETROGRADE PYELOGRAM, URETEROSCOPY AND STENT PLACEMENT;  Surgeon: Marco Severs, MD;  Location: [facility];  Service: Urology;  Laterality: Right;   CYSTOSCOPY/RETROGRADE/URETEROSCOPY/STONE EXTRACTION WITH BASKET Right 07/14/2016   Procedure: CYSTOSCOPY/RETROGRADE/URETEROSCOPY/STONE EXTRACTION WITH BASKET;  Surgeon: Marco Severs, MD;  Location: [facility];  Service: Urology;  Laterality: Right;   DIAGNOSTIC LAPAROSCOPIC LIVER BIOPSY N/A 02/10/2013   Procedure: DIAGNOSTIC LAPAROSCOPIC ;  Surgeon: Eddye Goodie, MD;  Location: [facility];  Service: General;  Laterality: N/A;  DIAGNOSTIC LAPAROSCOPY,laparoscopic ventral hernia repair with mesh lysis of adhesions   HERNIA REPAIR     HOLMIUM LASER APPLICATION Right 07/14/2016   Procedure: HOLMIUM LASER APPLICATION;  Surgeon: Marco Severs, MD;  Location: [facility];  Service: Urology;  Laterality: Right;   SPLENECTOMY  2004   TUBAL LIGATION     UPPER GASTROINTESTINAL ENDOSCOPY  12/08/2022   with colonoscopy at [facility] , Veena Nandigam, Hiatal Hernia, esophageal stricture, dilation,   VAGINAL HYSTERECTOMY N/A 01/10/2017   Procedure: HYSTERECTOMY VAGINAL W/ BILATERAL SALPINGECTOMY WITH  REPAIR OF INCIDENTAL CYSTOTOMY;  Surgeon: Othelia Blinks, MD;  Location: [facility];  Service: Gynecology;  Laterality: N/A;    ALLERGIES: No Known Allergies  FAMILY HISTORY:  Family History  Problem Relation Age of Onset   Alcohol abuse Mother    Alcohol abuse Father    Alcohol abuse Sister    Colon cancer Maternal Grandmother        colon   Diabetes Maternal Grandmother    Hypertension Maternal Grandmother    Thyroid  disease Neg Hx    Stomach cancer Neg Hx    Esophageal cancer Neg Hx    Rectal cancer Neg Hx    No known family history of hypercalcemia, kidney stone, hyperparathyroidism, or endocrine neoplasms.   SOCIAL HISTORY: Social  History   Socioeconomic History   Marital status: Single    Spouse name: Not on file   Number of children: 5   Years of education: Not on file   Highest education level: Not on file  Occupational History   Not on file  Tobacco Use   Smoking status: Every Day    Current packs/day: 1.00    Average packs/day: 1 pack/day for 32.0 years (32.0 ttl pk-yrs)    Types: Cigarettes   Smokeless tobacco: Never  Vaping Use   Vaping status: Never Used  Substance and Sexual Activity   Alcohol use: Not Currently    Comment: quit 12/2012   Drug use: Not Currently    Types: Crack cocaine, Marijuana, Cocaine    Comment: per pt quit 12-26-2012 marijuana 2 times a week   Sexual activity: Yes    Birth control/protection: Surgical, Post-menopausal  Other Topics Concern   Not on file  Social History Narrative   Not on file   Social Drivers of Health   Financial Resource Strain: Not on file  Food Insecurity: Not on file  Transportation Needs: Not on file  Physical Activity: Not on file  Stress: Not on file  Social Connections: Not on file    MEDICATIONS:  Current Outpatient Medications  Medication Sig Dispense Refill   cyclobenzaprine  (FLEXERIL ) 5 MG tablet Take 1 tablet (5 mg total) by mouth 3 (three) times daily as needed for muscle spasms. 30 tablet 0   divalproex  (DEPAKOTE ) 500 MG DR tablet Take 1 tablet (500 mg total) by mouth 3 (three) times daily. 90 tablet 3   hydrOXYzine  (ATARAX ) 25 MG tablet Take 1 tablet (25 mg total) by mouth 3 (three) times daily as needed. 90 tablet 3   nicotine  (NICODERM CQ ) 21 mg/24hr patch Place 1 patch (21 mg total) onto the skin daily. 28 patch 3   ondansetron  (ZOFRAN ) 4 MG tablet Take 1 tablet (4 mg total) by mouth every 8 (eight) hours as needed for nausea or vomiting. 30 tablet 0   pantoprazole  (PROTONIX ) 40 MG tablet Take 1 tablet (40 mg total) by mouth 2 (two) times daily. 30 tablet 1   pantoprazole  (PROTONIX ) 40 MG tablet Take 1 tablet (40 mg total)  by mouth daily. 30 tablet 3   prochlorperazine  (COMPAZINE ) 10 MG tablet Take 1 tablet (10 mg total) by mouth 2 (two) times daily as needed for nausea or vomiting. 10 tablet 0   QUEtiapine  (SEROQUEL ) 50 MG tablet Take 1 tablet (50 mg total) by mouth at bedtime. 30 tablet 3   rosuvastatin  (CRESTOR ) 10 MG tablet Take 1 tablet (10 mg total) by mouth daily. 90 tablet 1   sucralfate  (CARAFATE ) 1 g tablet Take 1 tablet (1 g  total) by mouth 4 (four) times daily. Take 10 mLs (1 g total) by mouth 4 (four) times daily - with meals and at bedtime. 120 tablet 0   vortioxetine  HBr (TRINTELLIX ) 10 MG TABS tablet Take 1 tablet (10 mg total) by mouth daily. (Patient not taking: Reported on 08/22/2023) 30 tablet 3   No current facility-administered medications for this visit.    PHYSICAL EXAM: Vitals:   08/22/23 1349  BP: 130/70  Pulse: 70  Resp: 20  SpO2: 95%  Weight: 133 lb 3.2 oz (60.4 kg)  Height: 5' 4.5 (1.638 m)      Body mass index is 22.51 kg/m.   General: Well developed, well nourished female in no apparent distress.  HEENT: AT/[place], no external lesions. Hearing intact to the spoken word Eyes: EOMI. Conjunctiva clear and no icterus. Neck: Trachea midline, neck supple without appreciable thyromegaly or lymphadenopathy and no palpable thyroid  nodules Lungs: Clear to auscultation, no wheeze. Respirations not labored Heart: S1S2, Regular in rate and rhythm.  Abdomen: Soft, non tender, non distended Neurologic: Alert, oriented, normal speech, deep tendon biceps reflexes normal,  no gross focal neurological deficit Extremities: No pedal pitting edema, no tremors of outstretched hands Skin: Warm, color good.  Psychiatric: Does not appear depressed or anxious  PERTINENT HISTORIC LABORATORY AND IMAGING STUDIES:  All pertinent laboratory results were reviewed. Please see HPI also for further details.   ASSESSMENT / PLAN  1. Primary hyperparathyroidism (HCC)   2. Vitamin D  deficiency   3.  Gastroesophageal reflux disease, unspecified whether esophagitis present   4. Other osteoporosis without current pathological fracture     -Patient has intermittent hypercalcemia at least since 2014, calcium  in the range of 10.4-11.0, although time mostly normal serum calcium .  She had elevated PTH of 122 with upper normal limit of 65 in 2018 however at that time she had vitamin D  deficiency with vitamin D  level of 16.  PTH was normal 61 in July 2024.  Lately she has PTH in the range of 115-168 range.  Although she has vitamin D  deficiency with hypercalcemia and elevated PTH she has primary hyperparathyroidism with a component of secondary hyperparathyroidism due to vitamin D  deficiency.  -She had ultrasound neck in September 2024 and CT scan in July 2024 and 2018 : showing hypoechoic soft tissue mass inferior and deep to the left thyroid  lobe measuring 1.6 cm.  -No history of kidney stone.  No fragility fracture.  She has a stable and acceptable renal function. -Overall biochemically patient has primary hyperparathyroidism with possible parathyroid  adenoma (based on CT finding / ultrasound finding?.  - Sestamibi parathyroid  scan in February 2025: no evidence of parathyroid  adenoma on planar or SPECT imaging.   - Patient has symptoms of hypercalcemia with GERD symptoms and increased urinary frequency.  - She has osteoporosis with lowest T-score of -3.4 in distal radius has declining bone mineral density and cortical bone consistent with related to primary hyperparathyroidism.  Plan: -Discussed that ultimate treatment for primary hyperparathyroidism is parathyroidectomy.  Patient wants to go for surgery.   -Recheck PTH, renal function panel, magnesium , ionized calcium  and vitamin D  level today. -Consider 4D CT neck looking for parathyroid  adenoma, as sestamibi parathyroid  scan did not show parathyroid  adenoma.  Will defer to surgeon's discretion. -No plan for antiresorptive therapy for  osteoporosis for now.  Expect to improve on bone mineral density after treatment for primary hyperparathyroidism.  Discussed about fall precautions. -Start pantoprazole  40 mg daily for GERD.  Addendum: Serum calcium  10.7  elevated with elevated ionized calcium .  PTH is elevated.  Vitamin D  is low.  Goal vitamin D  mildly low to low normal range.  Will start vitamin D3 supplement 1000 international unit daily.  Overall lab results consistent with primary hyperparathyroidism with a component of secondary hyperparathyroidism due to vitamin D  deficiency. -Refer to surgery for parathyroidectomy.   Latest Reference Range & Units 08/22/23 14:25  Sodium 135 - 146 mmol/L 139  Potassium 3.5 - 5.3 mmol/L 4.6  Chloride 98 - 110 mmol/L 106  CO2 20 - 32 mmol/L 30  Glucose 65 - 99 mg/dL 86  BUN 7 - 25 mg/dL 8  Creatinine 1.61 - 0.96 mg/dL 0.45  Calcium  8.6 - 10.4 mg/dL 40.9 (H)  BUN/Creatinine Ratio 6 - 22 (calc) SEE NOTE:  Calcium  Ionized 4.7 - 5.5 mg/dL 6.1 (H)  Phosphorus 2.5 - 4.5 mg/dL 3.9  Magnesium  1.5 - 2.5 mg/dL 2.0  (H): Data is abnormally high   Latest Reference Range & Units 08/22/23 14:25  Vitamin D , 25-Hydroxy 30 - 100 ng/mL 17 (L)  (L): Data is abnormally low   Latest Reference Range & Units 08/22/23 14:25  Glucose 65 - 99 mg/dL 86  PTH, Intact 16 - 77 pg/mL 129 (H)  Albumin MSPROF 3.6 - 5.1 g/dL 4.0  (H): Data is abnormally high   Diagnoses and all orders for this visit:  Primary hyperparathyroidism (HCC) -     Renal function panel -     VITAMIN D  25 Hydroxy (Vit-D Deficiency, Fractures) -     Parathyroid  hormone, intact (no Ca) -     Magnesium  -     Calcium , ionized -     Ambulatory referral to General Surgery  Vitamin D  deficiency -     VITAMIN D  25 Hydroxy (Vit-D Deficiency, Fractures)  Gastroesophageal reflux disease, unspecified whether esophagitis present -     pantoprazole  (PROTONIX ) 40 MG tablet; Take 1 tablet (40 mg total) by mouth daily.  Other osteoporosis  without current pathological fracture   DISPOSITION Follow up in clinic in 3 month suggested.  All questions answered and patient verbalized understanding of the plan.  [place] Kameron Blethen, MD [facility] Endocrinology [facility] [address] [place], [address] [place], [place] [address] Phone # [phone]  At least part of this note was generated using voice recognition software. Inadvertent word errors may have occurred, which were not recognized during the proofreading process.

## 2023-08-23 LAB — RENAL FUNCTION PANEL
Albumin: 4 g/dL (ref 3.6–5.1)
BUN: 8 mg/dL (ref 7–25)
CO2: 30 mmol/L (ref 20–32)
Calcium: 10.7 mg/dL — ABNORMAL HIGH (ref 8.6–10.4)
Chloride: 106 mmol/L (ref 98–110)
Creat: 0.65 mg/dL (ref 0.50–1.03)
Glucose, Bld: 86 mg/dL (ref 65–99)
Phosphorus: 3.9 mg/dL (ref 2.5–4.5)
Potassium: 4.6 mmol/L (ref 3.5–5.3)
Sodium: 139 mmol/L (ref 135–146)

## 2023-08-23 LAB — PARATHYROID HORMONE, INTACT (NO CA): PTH: 129 pg/mL — ABNORMAL HIGH (ref 16–77)

## 2023-08-23 LAB — VITAMIN D 25 HYDROXY (VIT D DEFICIENCY, FRACTURES): Vit D, 25-Hydroxy: 17 ng/mL — ABNORMAL LOW (ref 30–100)

## 2023-08-23 LAB — MAGNESIUM: Magnesium: 2 mg/dL (ref 1.5–2.5)

## 2023-08-23 LAB — CALCIUM, IONIZED: Calcium, Ion: 6.1 mg/dL — ABNORMAL HIGH (ref 4.7–5.5)

## 2023-08-24 ENCOUNTER — Ambulatory Visit: Payer: Self-pay | Admitting: Endocrinology

## 2023-09-05 ENCOUNTER — Other Ambulatory Visit: Payer: Self-pay | Admitting: Endocrinology

## 2023-09-05 DIAGNOSIS — E210 Primary hyperparathyroidism: Secondary | ICD-10-CM

## 2023-09-11 ENCOUNTER — Telehealth: Payer: Self-pay

## 2023-09-11 NOTE — Telephone Encounter (Signed)
 Left VM for patient requesting call back to determine if she is ok with going to [facility] for surgery as directed by MD.

## 2023-09-21 ENCOUNTER — Telehealth: Payer: Self-pay

## 2023-09-21 NOTE — Telephone Encounter (Signed)
 Patient called back to follow up on if she is ok with being referred to [facility] as directed by MD. Awaiting to hear back from patient.

## 2023-10-23 ENCOUNTER — Telehealth (INDEPENDENT_AMBULATORY_CARE_PROVIDER_SITE_OTHER): Payer: MEDICAID | Admitting: Physician Assistant

## 2023-10-23 DIAGNOSIS — F411 Generalized anxiety disorder: Secondary | ICD-10-CM | POA: Diagnosis not present

## 2023-10-23 DIAGNOSIS — F3130 Bipolar disorder, current episode depressed, mild or moderate severity, unspecified: Secondary | ICD-10-CM

## 2023-11-03 ENCOUNTER — Encounter (HOSPITAL_COMMUNITY): Payer: Self-pay | Admitting: Physician Assistant

## 2023-11-03 MED ORDER — QUETIAPINE FUMARATE 50 MG PO TABS: 50.0000 mg | ORAL_TABLET | Freq: Every day | ORAL | 3 refills | Status: DC

## 2023-11-03 MED ORDER — HYDROXYZINE HCL 25 MG PO TABS: 25.0000 mg | ORAL_TABLET | Freq: Three times a day (TID) | ORAL | 3 refills | Status: DC | PRN

## 2023-11-03 MED ORDER — VORTIOXETINE HBR 10 MG PO TABS
10.0000 mg | ORAL_TABLET | Freq: Every day | ORAL | 3 refills | Status: DC
Start: 2023-11-03 — End: 2023-12-05

## 2023-11-03 MED ORDER — DIVALPROEX SODIUM 500 MG PO DR TAB: 500.0000 mg | DELAYED_RELEASE_TABLET | Freq: Three times a day (TID) | ORAL | 3 refills | Status: DC

## 2023-11-03 NOTE — Progress Notes (Signed)
 BH MD/PA/NP OP Progress Note  Virtual Visit via Video Note  I connected with Lawrnce [name] on 10/23/23 at  1:00 PM EDT by a video enabled telemedicine application and verified that I am speaking with the correct person using two identifiers.  Location: Patient: Home Provider: Clinic   I discussed the limitations of evaluation and management by telemedicine and the availability of in person appointments. The patient expressed understanding and agreed to proceed.  Follow Up Instructions:  I discussed the assessment and treatment plan with the patient. The patient was provided an opportunity to ask questions and all were answered. The patient agreed with the plan and demonstrated an understanding of the instructions.   The patient was advised to call back or seek an in-person evaluation if the symptoms worsen or if the condition fails to improve as anticipated.  I provided 34 minutes of non-face-to-face time during this encounter.  Mary FORBES Bolster, PA    10/23/2023 1:00 PM Mary [name]  MRN:  [id number]  Chief Complaint:  Chief Complaint  Patient presents with   Follow-up   Medication Management   HPI:   Mary [name] is a 58 year old female with a past psychiatric history significant for generalized anxiety disorder and bipolar 1 disorder (most recent episode depressed) who presents to [facility] via virtual video visit for follow-up and medication management.  Patient was last seen by Dr. Harl on 07/31/2023.  During her last encounter, patient was being managed on the following psychiatric medications:  Vortioxetine  HBr (Trintellix ) 10 mg daily Hydroxyzine  25 mg 3 times daily as needed Seroquel  50 mg at bedtime Divalproex  500 mg ER 3 times daily  Patient presents to the encounter stating that she has been emotional as of late.  She reports that she cries at the drop of a dime.  She endorses racing thoughts and states that she  dwells on them quite often.  She reports that everything she does feels like a chore or task.  She endorses job related stressors.  She reports that all she does is go to work then come home.  She reports not having a family and states that she has not had sex in years.  Due to her emotions, patient reports that she often does not feel good enough and states that she is often competing with people half her age.  Patient endorses depression and rates her depression a 7-8 out of 10 with 10 being most severe.  Patient endorses depression from the time she wakes up to the time she goes to bed.  Patient reports that she feels a sense of impending doom whenever she is awake.  Patient endorses the following depressive symptoms: feelings of sadness, crying spells, lack of motivation, decreased energy, decreased appetite, decreased concentration, irritability, feelings of guilt/worthlessness, and hopelessness.  Patient also endorses anxiety and rates her anxiety as 5-6 out of 10.  Patient's main stressor revolves around work.  A PHQ-9 screen was performed with the patient scoring a 19.  A GAD-7 screen was also performed with the patient scoring a 20.  Patient is alert and oriented x 4, calm, cooperative, and fully engaged in conversation during the encounter.  Patient reports that she feels very emotional.  Patient exhibits depressed mood with tearful affect.  Patient denies suicidal or homicidal ideations.  She further denies auditory or visual hallucinations and does not appear to be responding to internal/external stimuli.  Patient endorses fair sleep and receives on average 6 hours of  sleep per night.  Patient endorses decreased appetite.  Patient denies alcohol consumption.  Patient endorses tobacco use and smokes on average a pack per day.  Patient endorses illicit drug use in the form of marijuana.  Visit Diagnosis:    ICD-10-CM   1. GAD (generalized anxiety disorder)  F41.1 vortioxetine  HBr (TRINTELLIX ) 10 MG  TABS tablet    hydrOXYzine  (ATARAX ) 25 MG tablet    QUEtiapine  (SEROQUEL ) 50 MG tablet    2. Bipolar I disorder, most recent episode depressed (HCC)  F31.30 QUEtiapine  (SEROQUEL ) 50 MG tablet    divalproex  (DEPAKOTE ) 500 MG DR tablet      Past Psychiatric History:  Cocaine dependence (in remission) Marijuana dependence (in remission) Borderline personality disorder Schizophrenia Substance-induced mood disorder PTSD Polysubstance abuse  Past Medical History:  Past Medical History:  Diagnosis Date   Bipolar depression (HCC)    Borderline personality disorder (HCC)    COPD (chronic obstructive pulmonary disease) (HCC)    Depression    GERD (gastroesophageal reflux disease)    Hiatal hernia    History of alcohol abuse    per pt none since 10/ 2014   History of attempted suicide    History of drug abuse in remission (HCC)    per pt in remission since 12-26-2012  polysubstance dependence (alcohol, crack, cocaine, 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   History of hepatitis C per pt first dx 2009 (approx)   consulted w/ dr comer (infectious disease) note 04-26-2016 , pt states was called and told per lab work no longer has hepatitis c    History of kidney stones    Hypercalcemia    mild and long-standing--- pt asymptomatic   Hyperlipidemia    Nausea and vomiting 07/04/2011   Primary hyperparathyroidism [facility])    endocrinology --  dr von   Pruritic erythematous rash    lower legs   PTSD (post-traumatic stress disorder)    Right ureteral stone    Schizophrenia (HCC)    Substance abuse (HCC)    Vitamin D  deficiency     Past Surgical History:  Procedure Laterality Date   COLONOSCOPY  12/08/2022   at [facility] with Veena Nandigam at same time as egd   CYSTOSCOPY N/A 01/10/2017   Procedure: CYSTOSCOPY;  Surgeon: Lorence Ozell CROME, MD;  Location: [facility];  Service: Gynecology;  Laterality: N/A;   CYSTOSCOPY W/ URETERAL STENT PLACEMENT Right 07/14/2016   Procedure: CYSTOSCOPY  WITH STENT REPLACEMENT;  Surgeon: Sherrilee Belvie CROME, MD;  Location: [facility];  Service: Urology;  Laterality: Right;   CYSTOSCOPY WITH RETROGRADE PYELOGRAM, URETEROSCOPY AND STENT PLACEMENT Right 06/28/2016   Procedure: CYSTOSCOPY WITH RETROGRADE PYELOGRAM, URETEROSCOPY AND STENT PLACEMENT;  Surgeon: Belvie CROME Sherrilee, MD;  Location: [facility];  Service: Urology;  Laterality: Right;   CYSTOSCOPY/RETROGRADE/URETEROSCOPY/STONE EXTRACTION WITH BASKET Right 07/14/2016   Procedure: CYSTOSCOPY/RETROGRADE/URETEROSCOPY/STONE EXTRACTION WITH BASKET;  Surgeon: Sherrilee Belvie CROME, MD;  Location: [facility];  Service: Urology;  Laterality: Right;   DIAGNOSTIC LAPAROSCOPIC LIVER BIOPSY N/A 02/10/2013   Procedure: DIAGNOSTIC LAPAROSCOPIC ;  Surgeon: Elspeth KYM Schultze, MD;  Location: [facility];  Service: General;  Laterality: N/A;  DIAGNOSTIC LAPAROSCOPY,laparoscopic ventral hernia repair with mesh lysis of adhesions   HERNIA REPAIR     HOLMIUM LASER APPLICATION Right 07/14/2016   Procedure: HOLMIUM LASER APPLICATION;  Surgeon: Sherrilee Belvie CROME, MD;  Location: [facility];  Service: Urology;  Laterality: Right;   SPLENECTOMY  2004   TUBAL LIGATION  UPPER GASTROINTESTINAL ENDOSCOPY  12/08/2022   with colonoscopy at [facility] , Veena Nandigam, Hiatal Hernia, esophageal stricture, dilation,   VAGINAL HYSTERECTOMY N/A 01/10/2017   Procedure: HYSTERECTOMY VAGINAL W/ BILATERAL SALPINGECTOMY WITH REPAIR OF INCIDENTAL CYSTOTOMY;  Surgeon: Lorence Ozell CROME, MD;  Location: [facility];  Service: Gynecology;  Laterality: N/A;    Family Psychiatric History:  Mother - heroin use (overdosed and died) Sister - alcoholic Sister - cocaine abuse Grandmother (maternal) - alcohol use Patient reports that 5 children were born addicted to cocaine.  Family History:  Family History  Problem Relation Age of Onset   Alcohol abuse Mother    Alcohol abuse Father    Alcohol abuse Sister     Colon cancer Maternal Grandmother        colon   Diabetes Maternal Grandmother    Hypertension Maternal Grandmother    Thyroid  disease Neg Hx    Stomach cancer Neg Hx    Esophageal cancer Neg Hx    Rectal cancer Neg Hx     Social History:  Social History   Socioeconomic History   Marital status: Single    Spouse name: Not on file   Number of children: 5   Years of education: Not on file   Highest education level: Not on file  Occupational History   Not on file  Tobacco Use   Smoking status: Every Day    Current packs/day: 1.00    Average packs/day: 1 pack/day for 32.0 years (32.0 ttl pk-yrs)    Types: Cigarettes   Smokeless tobacco: Never  Vaping Use   Vaping status: Never Used  Substance and Sexual Activity   Alcohol use: Not Currently    Comment: quit 12/2012   Drug use: Not Currently    Types: Crack cocaine, Marijuana, Cocaine    Comment: per pt quit 12-26-2012 marijuana 2 times a week   Sexual activity: Yes    Birth control/protection: Surgical, Post-menopausal  Other Topics Concern   Not on file  Social History Narrative   Not on file   Social Drivers of Health   Financial Resource Strain: Not on file  Food Insecurity: Not on file  Transportation Needs: Not on file  Physical Activity: Not on file  Stress: Not on file  Social Connections: Not on file    Allergies: No Known Allergies  Metabolic Disorder Labs: Lab Results  Component Value Date   HGBA1C 5.5 11/08/2022   No results found for: PROLACTIN Lab Results  Component Value Date   CHOL 201 (H) 11/08/2022   TRIG 57 11/08/2022   HDL 73 11/08/2022   CHOLHDL 2.8 11/08/2022   LDLCALC 118 (H) 11/08/2022   Lab Results  Component Value Date   TSH 1.275 10/04/2022    Therapeutic Level Labs: No results found for: LITHIUM Lab Results  Component Value Date   VALPROATE <10 (L) 07/30/2023   No results found for: CBMZ  Current Medications: Current Outpatient Medications  Medication  Sig Dispense Refill   cyclobenzaprine  (FLEXERIL ) 5 MG tablet Take 1 tablet (5 mg total) by mouth 3 (three) times daily as needed for muscle spasms. 30 tablet 0   divalproex  (DEPAKOTE ) 500 MG DR tablet Take 1 tablet (500 mg total) by mouth 3 (three) times daily. 90 tablet 3   hydrOXYzine  (ATARAX ) 25 MG tablet Take 1 tablet (25 mg total) by mouth 3 (three) times daily as needed. 90 tablet 3   nicotine  (NICODERM CQ ) 21 mg/24hr patch Place 1 patch (21 mg total) onto  the skin daily. 28 patch 3   ondansetron  (ZOFRAN ) 4 MG tablet Take 1 tablet (4 mg total) by mouth every 8 (eight) hours as needed for nausea or vomiting. 30 tablet 0   pantoprazole  (PROTONIX ) 40 MG tablet Take 1 tablet (40 mg total) by mouth 2 (two) times daily. 30 tablet 1   pantoprazole  (PROTONIX ) 40 MG tablet Take 1 tablet (40 mg total) by mouth daily. 30 tablet 3   prochlorperazine  (COMPAZINE ) 10 MG tablet Take 1 tablet (10 mg total) by mouth 2 (two) times daily as needed for nausea or vomiting. 10 tablet 0   QUEtiapine  (SEROQUEL ) 50 MG tablet Take 1 tablet (50 mg total) by mouth at bedtime. 30 tablet 3   rosuvastatin  (CRESTOR ) 10 MG tablet Take 1 tablet (10 mg total) by mouth daily. 90 tablet 1   sucralfate  (CARAFATE ) 1 g tablet Take 1 tablet (1 g total) by mouth 4 (four) times daily. Take 10 mLs (1 g total) by mouth 4 (four) times daily - with meals and at bedtime. 120 tablet 0   vortioxetine  HBr (TRINTELLIX ) 10 MG TABS tablet Take 1 tablet (10 mg total) by mouth daily. 30 tablet 3   No current facility-administered medications for this visit.     Musculoskeletal: Strength & Muscle Tone: within normal limits Gait & Station: normal Patient leans: N/A  Psychiatric Specialty Exam: Review of Systems  Psychiatric/Behavioral:  Positive for dysphoric mood and sleep disturbance. Negative for decreased concentration, hallucinations, self-injury and suicidal ideas. The patient is nervous/anxious. The patient is not hyperactive.      There were no vitals taken for this visit.There is no height or weight on file to calculate BMI.  General Appearance: Casual  Eye Contact:  Good  Speech:  Clear and Coherent and Normal Rate  Volume:  Normal  Mood:  Anxious and Depressed  Affect:  Congruent and Tearful  Thought Process:  Coherent, Goal Directed, and Descriptions of Associations: Intact  Orientation:  Full (Time, Place, and Person)  Thought Content: WDL   Suicidal Thoughts:  No  Homicidal Thoughts:  No  Memory:  Immediate;   Good Recent;   Good Remote;   Good  Judgement:  Good  Insight:  Good  Psychomotor Activity:  Normal  Concentration:  Concentration: Good and Attention Span: Good  Recall:  Good  Fund of Knowledge: Good  Language: Good  Akathisia:  No  Handed:  Right  AIMS (if indicated): done; 0  Assets:  Communication Skills Desire for Improvement Financial Resources/Insurance Housing Social Support Vocational/Educational  ADL's:  Intact  Cognition: WNL  Sleep:  Fair   Screenings: AIMS    Flowsheet Row Video Visit from 10/23/2023 in [facility]  AIMS Total Score 0   GAD-7    Flowsheet Row Video Visit from 10/23/2023 in [facility] Video Visit from 07/31/2023 in [facility] Video Visit from 03/20/2023 in [facility] Video Visit from 12/27/2022 in [facility] Video Visit from 09/27/2022 in [facility]  Total GAD-7 Score 20 20 19 21 12    PHQ2-9    Flowsheet Row Video Visit from 10/23/2023 in [facility] Video Visit from 07/31/2023 in [facility] Office Visit from 03/28/2023 in [facility] [place] Video Visit from 03/20/2023 in [facility] Video Visit from 12/27/2022 in [facility]  PHQ-2 Total Score 6 4 0 2 0  PHQ-9 Total Score 19 16 -- 8 5   Flowsheet Row Video Visit from 10/23/2023 in [facility] Video Visit from 07/31/2023 in [facility] ED from 07/30/2023 in [facility] Emergency Department at [facility]  C-SSRS RISK CATEGORY Moderate Risk Error: Q3, 4, or 5 should not be populated when Q2 is No No Risk     Assessment and Plan:   Tesslyn Baumert is a 58 year old female with a past psychiatric history significant for generalized anxiety disorder and bipolar 1 disorder (most recent episode depressed) who presents to [facility] via virtual video visit for follow-up and medication management.  Patient presents to the encounter stating that she has been very emotional as of late.  She reports that she often experiences racing thoughts that she dwells on.  She reports that everything she does feels like a chore or task.  She continues to experience worsening depression and anxiety attributed to job related stressors.  A PHQ-9 screen was performed with the patient scoring a 19.  A GAD-7 screen was also performed with the patient scoring a 20.  During the assessment, patient informed provider that she has only been taking her Depakote  roughly 1 time a day.  She also reports that her Trintellix  was never sent to her pharmacy so she never started the medication.  Provider encouraged patient to continue taking Depakote  as prescribed (take it up to 3 times a day).  Provider to place patient on Trintellix  10 mg daily for the management of her depressive symptoms and anxiety.  Patient was agreeable to recommendations.  Patient's medications to be e-prescribed to pharmacy of choice.  A [place] Suicide Severity Rating Scale was performed with the patient being considered moderate risk.  Patient denies suicidal ideations and is able to contract for safety at this time.  Safety  planning was discussed with the patient prior to the conclusion of the encounter.  - Patient was instructed to contact 911 in the event of a mental health crisis. - Patient was instructed to contact [facility] in the event of a mental health crisis. - Patient was instructed to present to [facility] in the event of a mental health crisis.  Collaboration of Care: Collaboration of Care: Medication Management AEB provider managing patient's psychiatric medications, Psychiatrist AEB patient being followed by mental health provider at this facility, and Other provider involved in patient's care AEB patient being seen by endocrinology  Patient/Guardian was advised Release of Information must be obtained prior to any record release in order to collaborate their care with an outside provider. Patient/Guardian was advised if they have not already done so to contact the registration department to sign all necessary forms in order for us  to release information regarding their care.   Consent: Patient/Guardian gives verbal consent for treatment and assignment of benefits for services provided during this visit. Patient/Guardian expressed understanding and agreed to proceed.   1. GAD (generalized anxiety disorder)  - vortioxetine  HBr (TRINTELLIX ) 10 MG TABS tablet; Take 1 tablet (10 mg total) by mouth daily.  Dispense: 30 tablet; Refill: 3 - hydrOXYzine  (ATARAX ) 25 MG tablet; Take 1 tablet (25 mg total) by mouth 3 (three) times daily as needed.  Dispense: 90 tablet; Refill: 3 - QUEtiapine  (SEROQUEL ) 50 MG tablet; Take 1 tablet (50 mg total) by mouth at  bedtime.  Dispense: 30 tablet; Refill: 3  2. Bipolar I disorder, most recent episode depressed (HCC)  - QUEtiapine  (SEROQUEL ) 50 MG tablet; Take 1 tablet (50 mg total) by mouth at bedtime.  Dispense: 30 tablet; Refill: 3 - divalproex  (DEPAKOTE ) 500 MG DR tablet; Take 1 tablet (500 mg total) by mouth 3  (three) times daily.  Dispense: 90 tablet; Refill: 3  Patient to follow up in 6 weeks Provider spent a total of 34 minutes with the patient/reviewing the patient's chart  Mary FORBES Bolster, PA 11/03/2023, 9:25 AM

## 2023-11-09 ENCOUNTER — Telehealth (HOSPITAL_COMMUNITY): Payer: Self-pay

## 2023-11-09 NOTE — Telephone Encounter (Signed)
 PA as submitted on 08/29 via cover my meds for patients TRINTELLIX  10mg . Awaiting decision.

## 2023-11-16 NOTE — Telephone Encounter (Signed)
 PA was denied by patients insurance company for Pts trintellix  10 mg stating noon-supportive diagnostic code which was given from past visits from Dr. Harl notes. Provider can make appeal with papers provided in the nurses station.   JNL

## 2023-11-21 ENCOUNTER — Telehealth (HOSPITAL_COMMUNITY): Payer: MEDICAID | Admitting: Psychiatry

## 2023-11-29 ENCOUNTER — Ambulatory Visit: Payer: Self-pay

## 2023-11-29 ENCOUNTER — Encounter: Payer: Self-pay | Admitting: Endocrinology

## 2023-11-29 ENCOUNTER — Ambulatory Visit: Payer: MEDICAID | Admitting: Endocrinology

## 2023-11-29 VITALS — BP 102/70 | HR 52 | Ht 64.5 in | Wt 127.0 lb

## 2023-11-29 DIAGNOSIS — M818 Other osteoporosis without current pathological fracture: Secondary | ICD-10-CM | POA: Diagnosis not present

## 2023-11-29 DIAGNOSIS — E559 Vitamin D deficiency, unspecified: Secondary | ICD-10-CM

## 2023-11-29 DIAGNOSIS — E210 Primary hyperparathyroidism: Secondary | ICD-10-CM

## 2023-11-29 NOTE — Telephone Encounter (Signed)
 Call to patient scheduled her for a 9:15 AM appointment for tomorrow.   Having foort and leg pain.  Requesting a foot exam.

## 2023-11-29 NOTE — Progress Notes (Addendum)
 Outpatient Endocrinology Note Mary Jubilee Vivero, MD  11/29/23  Patient's Name: Mary [name]    DOB: 1965-12-10    MRN: [id number]  REASON OF VISIT: Follow-up for hypercalcemia / primary hyperparathyroidism  REFERRING PROVIDER: Tobie Gaines, DO  PCP: Tobie Gaines, DO  HISTORY OF PRESENT ILLNESS:   Mary [name] is a 58 y.o. old female with past medical history listed below, is here for follow-up of hypercalcemia / primary hyperparathyroidism  Pertinent Calcium  History: - Patient had CT chest, abdomen and pelvis with contrast on October 04, 2022 showed left thoracic inlet paraesophageal overall 14 mm heterogeneously enhancing soft tissue nodule which was nonenhancing on the early contrast.  CTA in 2018, suspicious for parathyroid  adenoma.  Due to concern of primary hyperparathyroidism patient was referred to endocrinology for further evaluation and management, was initially seen in 11/2022.  -In 2018 patient had elevated PTH of 122 with upper normal limit of 65 however at that time she had vitamin D  deficiency with vitamin D  level 16.  She had normal serum calcium  at that time.  Patient had intermittent mild hypercalcemia at least from 2014 in the range of 10.4-11.0 and other times with normal serum calcium .  Repeat PTH in July 2024 was normal 61 with serum calcium  of 10.3.   -No history of kidney stone.  She has not been on medication that increase serum calcium  including hydrochlorothiazide and lithium.  No history of fragility fracture.  Diagnosis of osteopenia, she had DEXA scan in March 2018 consistent with osteopenia with lumbar spine T-score of -1.7, T-score -1.8 in distal forearm and T-score -1.7 in left and right femoral neck. No family history of kidney stone, hyperparathyroidism or endocrine neoplasm. -Patient drinks milk almost daily. She has not been taking any multivitamin, calcium  or vitamin D  supplement. -Patient was evaluated for primary hyperparathyroidism/hypercalcemia in 2018 by Dr.  Von in this endocrinology clinic and planned for monitoring.  - CT Scan in 10/04/2022 - Mediastinum/Nodes: Left thoracic inlet, paraesophageal oval 14 mm heterogeneously enhancing soft tissue nodule which was nonenhancing on the early contrast phase CTA in 2018. This has morphology suspicious for parathyroid  adenoma  # She had ultrasound thyroid  in November 15, 2022 no discrete thyroid  nodules however showed uniformly hypoechoic soft tissue inferior and deep to the lower left thyroid  measuring 1.6 x 1.2 x 1.0 cm, could represent parathyroid  adenoma/gland.  - In 11/2022: laboratory evaluation serum calcium  elevated 11.1 and PTH elevated 115 with upper normal limit of 77, normal phosphorus and normal renal function.  Normal magnesium .  Overall consistent with primary hyperparathyroidism.  Patient has vitamin D  level 18 is low, however in the context of hypercalcemia, no vitamin D  supplement at this time.  As vitamin D  is low has some component of secondary hyperparathyroidism, other labs primarily consistent of primary hyperparathyroidism.  # May 03, 2023 patient had sestamibi parathyroid  nuclear scan : No evidence of parathyroid  adenoma on planar or SPECT imaging.   24-hour urine calcium  is normal 162.  # Osteoporosis. : - DEXA scan in July 09, 2023 consistent with osteoporosis with lowest T-score of -2.4.  Distal radius, -2.18 lumbar spine L1-L4.  Results:   Lumbar spine L1-L4 Femoral neck (FN) 33% distal radius  T-score   -3.1 RFN: -2.3 LFN: -2.5 -3.4  Change in BMD from previous DXA test (%) Down 17.3%* Down 12.9%* Down 19.8%*  (*) statistically significant  She is chronically smoker smoked 1 pack/day has been smoking since teenage.  She has GERD symptoms.  24-hour urine calcium  was  normal 162 in May 2025.  She has normal renal function.  She has no kidney stone.  Menopause at the age of early 4s.  She had steroid injection into the lower back in the past.  No antiresorptive therapy  started.  Expect to improve with bone mineral density after treatment for primary hyperparathyroidism.  In June 2025 : Laboratory evaluation in June 2025 with elevated serum calcium , elevated PTH send consistent with primary hyperparathyroidism with a component of secondary hyperparathyroidism due to vitamin D  deficiency.  Patient was referred to surgery, Dr. Eletha, does not take her medical insurance, was referred to [facility].  Interval history  Patient was referred to [facility] surgery for parathyroidectomy, she has not been seen yet, not able to contact with her on the phone.  Patient has no fall and fracture.  She has not been taking vitamin D .  She has been taking pantoprazole  for GERD and her symptoms have improved.  She has no other complaints today.  REVIEW OF SYSTEMS:  As per history of present illness.   PAST MEDICAL HISTORY: Past Medical History:  Diagnosis Date   Bipolar depression (HCC)    Borderline personality disorder (HCC)    COPD (chronic obstructive pulmonary disease) (HCC)    Depression    GERD (gastroesophageal reflux disease)    Hiatal hernia    History of alcohol abuse    per pt none since 10/ 2014   History of attempted suicide    History of drug abuse in remission (HCC)    per pt in remission since 12-26-2012  polysubstance dependence (alcohol, crack, cocaine, 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   History of hepatitis C per pt first dx 2009 (approx)   consulted w/ dr comer (infectious disease) note 04-26-2016 , pt states was called and told per lab work no longer has hepatitis c    History of kidney stones    Hypercalcemia    mild and long-standing--- pt asymptomatic   Hyperlipidemia    Nausea and vomiting 07/04/2011   Primary hyperparathyroidism [facility])    endocrinology --  dr von   Pruritic erythematous rash    lower legs   PTSD (post-traumatic stress disorder)    Right ureteral stone    Schizophrenia (HCC)    Substance abuse (HCC)    Vitamin D   deficiency     PAST SURGICAL HISTORY: Past Surgical History:  Procedure Laterality Date   COLONOSCOPY  12/08/2022   at [facility] with Veena Nandigam at same time as egd   CYSTOSCOPY N/A 01/10/2017   Procedure: CYSTOSCOPY;  Surgeon: Lorence Ozell CROME, MD;  Location: [facility];  Service: Gynecology;  Laterality: N/A;   CYSTOSCOPY W/ URETERAL STENT PLACEMENT Right 07/14/2016   Procedure: CYSTOSCOPY WITH STENT REPLACEMENT;  Surgeon: Sherrilee Belvie CROME, MD;  Location: [facility];  Service: Urology;  Laterality: Right;   CYSTOSCOPY WITH RETROGRADE PYELOGRAM, URETEROSCOPY AND STENT PLACEMENT Right 06/28/2016   Procedure: CYSTOSCOPY WITH RETROGRADE PYELOGRAM, URETEROSCOPY AND STENT PLACEMENT;  Surgeon: Belvie CROME Sherrilee, MD;  Location: [facility];  Service: Urology;  Laterality: Right;   CYSTOSCOPY/RETROGRADE/URETEROSCOPY/STONE EXTRACTION WITH BASKET Right 07/14/2016   Procedure: CYSTOSCOPY/RETROGRADE/URETEROSCOPY/STONE EXTRACTION WITH BASKET;  Surgeon: Sherrilee Belvie CROME, MD;  Location: [facility];  Service: Urology;  Laterality: Right;   DIAGNOSTIC LAPAROSCOPIC LIVER BIOPSY N/A 02/10/2013   Procedure: DIAGNOSTIC LAPAROSCOPIC ;  Surgeon: Elspeth KYM Schultze, MD;  Location: [facility];  Service: General;  Laterality: N/A;  DIAGNOSTIC LAPAROSCOPY,laparoscopic ventral hernia repair  with mesh lysis of adhesions   HERNIA REPAIR     HOLMIUM LASER APPLICATION Right 07/14/2016   Procedure: HOLMIUM LASER APPLICATION;  Surgeon: Sherrilee Belvie CROME, MD;  Location: [facility];  Service: Urology;  Laterality: Right;   SPLENECTOMY  2004   TUBAL LIGATION     UPPER GASTROINTESTINAL ENDOSCOPY  12/08/2022   with colonoscopy at [facility] , Veena Nandigam, Hiatal Hernia, esophageal stricture, dilation,   VAGINAL HYSTERECTOMY N/A 01/10/2017   Procedure: HYSTERECTOMY VAGINAL W/ BILATERAL SALPINGECTOMY WITH REPAIR OF INCIDENTAL CYSTOTOMY;  Surgeon: Lorence Ozell CROME, MD;  Location: [facility];   Service: Gynecology;  Laterality: N/A;    ALLERGIES: No Known Allergies  FAMILY HISTORY:  Family History  Problem Relation Age of Onset   Alcohol abuse Mother    Alcohol abuse Father    Alcohol abuse Sister    Colon cancer Maternal Grandmother        colon   Diabetes Maternal Grandmother    Hypertension Maternal Grandmother    Thyroid  disease Neg Hx    Stomach cancer Neg Hx    Esophageal cancer Neg Hx    Rectal cancer Neg Hx    No known family history of hypercalcemia, kidney stone, hyperparathyroidism, or endocrine neoplasms.   SOCIAL HISTORY: Social History   Socioeconomic History   Marital status: Single    Spouse name: Not on file   Number of children: 5   Years of education: Not on file   Highest education level: Not on file  Occupational History   Not on file  Tobacco Use   Smoking status: Every Day    Current packs/day: 1.00    Average packs/day: 1 pack/day for 32.0 years (32.0 ttl pk-yrs)    Types: Cigarettes   Smokeless tobacco: Never  Vaping Use   Vaping status: Never Used  Substance and Sexual Activity   Alcohol use: Not Currently    Comment: quit 12/2012   Drug use: Not Currently    Types: Crack cocaine, Marijuana, Cocaine    Comment: per pt quit 12-26-2012 marijuana 2 times a week   Sexual activity: Yes    Birth control/protection: Surgical, Post-menopausal  Other Topics Concern   Not on file  Social History Narrative   Not on file   Social Drivers of Health   Financial Resource Strain: Not on file  Food Insecurity: Not on file  Transportation Needs: Not on file  Physical Activity: Not on file  Stress: Not on file  Social Connections: Not on file    MEDICATIONS:  Current Outpatient Medications  Medication Sig Dispense Refill   cyclobenzaprine  (FLEXERIL ) 5 MG tablet Take 1 tablet (5 mg total) by mouth 3 (three) times daily as needed for muscle spasms. 30 tablet 0   divalproex  (DEPAKOTE ) 500 MG DR tablet Take 1 tablet (500 mg total) by  mouth 3 (three) times daily. 90 tablet 3   hydrOXYzine  (ATARAX ) 25 MG tablet Take 1 tablet (25 mg total) by mouth 3 (three) times daily as needed. 90 tablet 3   nicotine  (NICODERM CQ ) 21 mg/24hr patch Place 1 patch (21 mg total) onto the skin daily. 28 patch 3   ondansetron  (ZOFRAN ) 4 MG tablet Take 1 tablet (4 mg total) by mouth every 8 (eight) hours as needed for nausea or vomiting. 30 tablet 0   pantoprazole  (PROTONIX ) 40 MG tablet Take 1 tablet (40 mg total) by mouth 2 (two) times daily. 30 tablet 1   pantoprazole  (PROTONIX ) 40 MG tablet Take 1  tablet (40 mg total) by mouth daily. 30 tablet 3   prochlorperazine  (COMPAZINE ) 10 MG tablet Take 1 tablet (10 mg total) by mouth 2 (two) times daily as needed for nausea or vomiting. 10 tablet 0   QUEtiapine  (SEROQUEL ) 50 MG tablet Take 1 tablet (50 mg total) by mouth at bedtime. 30 tablet 3   rosuvastatin  (CRESTOR ) 10 MG tablet Take 1 tablet (10 mg total) by mouth daily. 90 tablet 1   sucralfate  (CARAFATE ) 1 g tablet Take 1 tablet (1 g total) by mouth 4 (four) times daily. Take 10 mLs (1 g total) by mouth 4 (four) times daily - with meals and at bedtime. 120 tablet 0   vortioxetine  HBr (TRINTELLIX ) 10 MG TABS tablet Take 1 tablet (10 mg total) by mouth daily. 30 tablet 3   No current facility-administered medications for this visit.    PHYSICAL EXAM: Vitals:   11/29/23 1435  BP: 102/70  Pulse: (!) 52  SpO2: 97%  Weight: 127 lb (57.6 kg)  Height: 5' 4.5 (1.638 m)       Body mass index is 21.46 kg/m.   General: Well developed, well nourished female in no apparent distress.  HEENT: AT/[place], no external lesions. Hearing intact to the spoken word Eyes: EOMI. Conjunctiva clear and no icterus. Neck: Trachea midline, neck supple without appreciable thyromegaly or lymphadenopathy and no palpable thyroid  nodules Abdomen: Soft, non tender, non distended Neurologic: Alert, oriented, normal speech, deep tendon biceps reflexes normal,  no gross focal  neurological deficit Extremities: No pedal pitting edema, no tremors of outstretched hands Skin: Warm, color good.  Psychiatric: Does not appear depressed or anxious  PERTINENT HISTORIC LABORATORY AND IMAGING STUDIES:  All pertinent laboratory results were reviewed. Please see HPI also for further details.   ASSESSMENT / PLAN  1. Primary hyperparathyroidism (HCC)   2. Vitamin D  deficiency   3. Other osteoporosis without current pathological fracture    -Patient has intermittent hypercalcemia at least since 2014, calcium  in the range of 10.4-11.0, although time mostly normal serum calcium .  She had elevated PTH of 122 with upper normal limit of 65 in 2018 however at that time she had vitamin D  deficiency with vitamin D  level of 16.  PTH was normal 61 in July 2024.  Lately she has PTH in the range of 115-168 range.  Although she has vitamin D  deficiency with hypercalcemia and elevated PTH she has primary hyperparathyroidism with a component of secondary hyperparathyroidism due to vitamin D  deficiency.  -She had ultrasound neck in September 2024 and CT scan in July 2024 and 2018 : showing hypoechoic soft tissue mass inferior and deep to the left thyroid  lobe measuring 1.6 cm.  -No history of kidney stone.  No fragility fracture.  She has a stable and acceptable renal function.  -Overall biochemically patient has primary hyperparathyroidism with possible parathyroid  adenoma (based on CT finding / ultrasound finding?.  - Sestamibi parathyroid  scan in February 2025: no evidence of parathyroid  adenoma on planar or SPECT imaging.   - Patient has symptoms of hypercalcemia with GERD symptoms and increased urinary frequency.  On pantoprazole .  - She has osteoporosis with lowest T-score of -3.4 in distal radius has declining bone mineral density and cortical bone consistent with related to primary hyperparathyroidism.  Plan: -Discussed that ultimate treatment for primary hyperparathyroidism is  parathyroidectomy.  Patient wants to go for surgery.  She was referred to surgery locally Dr. Eletha.  Does not take her medical insurance.  She was also referred  to [facility] however she was not able to be contacted.  She has indication for parathyroidectomy due to osteoporosis. -After the lab results from today, will refer to [facility] for parathyroidectomy. - Will check renal function panel PTH and ionized calcium  today. -Patient is advised to take vitamin D3 over-the-counter 1000 international unit daily. -Consider 4D CT neck looking for parathyroid  adenoma, as sestamibi parathyroid  scan did not show parathyroid  adenoma.  Will defer to surgeon's discretion. -No plan for antiresorptive therapy for osteoporosis for now.  Expect to improve on bone mineral density after treatment for primary hyperparathyroidism.  Discussed about fall precautions.   Diagnoses and all orders for this visit:  Primary hyperparathyroidism (HCC) -     Renal function panel -     Parathyroid  hormone, intact (no Ca) -     Calcium , ionized  Vitamin D  deficiency  Other osteoporosis without current pathological fracture   Addendum: Labs reviewed corrected serum calcium  is 10.4 in the high normal range, serum calcium  of 10.7 with albumin of 4.4.  Elevated ionized calcium .  Phosphorus normal.  Renal function normal.  PTH is elevated at 154.  Overall consistent with primary hyperparathyroidism however she also has vitamin D  deficiency.  She has improvement on serum calcium  compared to last visit.  Will discuss with patient if she really wants to go for parathyroidectomy /parathyroid  surgery, will refer to [facility] , otherwise we will continue to monitor and treat for osteoporosis.   Latest Reference Range & Units 11/29/23 15:26  Sodium 135 - 146 mmol/L 137  Potassium 3.5 - 5.3 mmol/L 4.6  Chloride 98 - 110 mmol/L 104  CO2 20 - 32 mmol/L 28  Glucose 65 - 99 mg/dL 70  BUN 7 - 25 mg/dL 10  Creatinine 9.49 - 8.96 mg/dL 9.26   Calcium  8.6 - 10.4 mg/dL 89.2 (H)  BUN/Creatinine Ratio 6 - 22 (calc) SEE NOTE:  Calcium  Ionized 4.7 - 5.5 mg/dL 5.8 (H)  Phosphorus 2.5 - 4.5 mg/dL 3.9  (H): Data is abnormally high   Latest Reference Range & Units 11/29/23 15:26  Glucose 65 - 99 mg/dL 70  PTH, Intact 16 - 77 pg/mL 154 (H)  Albumin MSPROF 3.6 - 5.1 g/dL 4.4  (H): Data is abnormally high  DISPOSITION Follow up in clinic in 6 month suggested.  Labs today.  All questions answered and patient verbalized understanding of the plan.  Mary Troyce Febo, MD [facility] Endocrinology [facility] [address] [place], [address] [place], [place] [address] Phone # [phone]  At least part of this note was generated using voice recognition software. Inadvertent word errors may have occurred, which were not recognized during the proofreading process.

## 2023-11-29 NOTE — Telephone Encounter (Signed)
 FYI Only or Action Required?: Action required by provider: clinical question for provider.  Patient was last seen in primary care on 03/28/2023 by Tobie Gaines, DO.  Called Nurse Triage reporting Leg Pain and Foot Pain.  Symptoms began a week ago.  Interventions attempted: Rest, hydration, or home remedies.  Symptoms are: gradually worsening.  Triage Disposition: See PCP When Office is Open (Within 3 Days)  Patient/caregiver understands and will follow disposition?: Yes   Copied from CRM [id number]. Topic: Clinical - Red Word Triage >> Nov 29, 2023 12:02 PM Susanna ORN wrote: Red Word that prompted transfer to Nurse Triage: Patient states she wears still toe shoes at work and she's wanting her feet to be checked. States she has osteoporosis and her feet are burning and legs hurts. She also states they are swelling as well. Wants to be seen today or tomorrow, if possible. Reason for Disposition  [1] MODERATE pain (e.g., interferes with normal activities, limping) AND [2] present > 3 days  Answer Assessment - Initial Assessment Questions Additional info:  1) No appointments are available at practice location until 12/11/23, patient will proceed to urgent care.  2) Patient is requesting a temporary work note from pcp for missed work due to foot pain. Please advise.  3) Patient asking location of her pulmonology appointment today, information provided.    1. ONSET: When did the pain start?      One week 2. LOCATION: Where is the pain located?      Bilateral feet, left leg 3. PAIN: How bad is the pain?    (Scale 1-10; or mild, moderate, severe)     Moderate-burning pain 4. WORK OR EXERCISE: Has there been any recent work or exercise that involved this part of the body?      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  5. CAUSE: What do you think is causing the foot pain?     Walking at work 6. OTHER SYMPTOMS: Do you have any other symptoms? (e.g., leg pain, rash, fever, numbness)     Leg  pain 7. PREGNANCY: Is there any chance you are pregnant? When was your last menstrual period?  Protocols used: Foot Pain-A-AH

## 2023-11-30 ENCOUNTER — Ambulatory Visit: Payer: MEDICAID | Admitting: Student

## 2023-11-30 ENCOUNTER — Telehealth: Payer: Self-pay | Admitting: Podiatry

## 2023-11-30 ENCOUNTER — Other Ambulatory Visit: Payer: Self-pay

## 2023-11-30 VITALS — BP 109/63 | HR 57 | Temp 98.1°F | Ht 64.0 in | Wt 129.2 lb

## 2023-11-30 DIAGNOSIS — Z23 Encounter for immunization: Secondary | ICD-10-CM | POA: Diagnosis not present

## 2023-11-30 DIAGNOSIS — M2142 Flat foot [pes planus] (acquired), left foot: Secondary | ICD-10-CM

## 2023-11-30 DIAGNOSIS — M2141 Flat foot [pes planus] (acquired), right foot: Secondary | ICD-10-CM

## 2023-11-30 DIAGNOSIS — M722 Plantar fascial fibromatosis: Secondary | ICD-10-CM

## 2023-11-30 LAB — RENAL FUNCTION PANEL
Albumin: 4.4 g/dL (ref 3.6–5.1)
BUN: 10 mg/dL (ref 7–25)
CO2: 28 mmol/L (ref 20–32)
Calcium: 10.7 mg/dL — ABNORMAL HIGH (ref 8.6–10.4)
Chloride: 104 mmol/L (ref 98–110)
Creat: 0.73 mg/dL (ref 0.50–1.03)
Glucose, Bld: 70 mg/dL (ref 65–99)
Phosphorus: 3.9 mg/dL (ref 2.5–4.5)
Potassium: 4.6 mmol/L (ref 3.5–5.3)
Sodium: 137 mmol/L (ref 135–146)

## 2023-11-30 LAB — CALCIUM, IONIZED: Calcium, Ion: 5.8 mg/dL — ABNORMAL HIGH (ref 4.7–5.5)

## 2023-11-30 LAB — PARATHYROID HORMONE, INTACT (NO CA): PTH: 154 pg/mL — ABNORMAL HIGH (ref 16–77)

## 2023-11-30 MED ORDER — DICLOFENAC SODIUM 1 % EX GEL: 4.0000 g | Freq: Four times a day (QID) | CUTANEOUS | 0 refills | Status: AC

## 2023-11-30 NOTE — Progress Notes (Signed)
 Established Patient Office Visit  Subjective   Patient ID: Mary [name], female    DOB: [birth year]  Age: 58 y.o. MRN: [id number]  Chief Complaint  Patient presents with   pain    Having pain in leg and feet  # 10  / CONCERN ABOUT WEIGHT LOSS / FLU SHOT / ? MEDICATION REFILL    Mary [name] is a 58 y.o. who presents to the clinic for bilateral plantar feet pain. Please see problem based assessment and plan for additional details.   Patient Active Problem List   Diagnosis Date Noted   Flat feet, bilateral 11/30/2023   Chronic low back pain 03/28/2023   Immunization due 03/28/2023   Constipation 03/28/2023   Primary hyperparathyroidism (HCC) 12/13/2022   Hyperlipidemia 12/13/2022   Tobacco dependence 12/06/2020   GAD (generalized anxiety disorder) 11/11/2019   Bipolar I disorder, most recent episode depressed (HCC) 11/11/2019   Reactive airway disease without asthma 04/12/2016   Chronic hepatitis C without hepatic coma (HCC)    Drug abuse and dependence (HCC)    Nausea and vomiting 07/04/2011      Objective:     BP 109/63 (BP Location: Left Arm, Patient Position: Sitting, Cuff Size: Normal)   Pulse (!) 57   Temp 98.1 F (36.7 C) (Oral)   Ht 5' 4 (1.626 m)   Wt 129 lb 3.2 oz (58.6 kg)   LMP  (LMP Unknown)   SpO2 99%   BMI 22.18 kg/m  BP Readings from Last 3 Encounters:  11/30/23 109/63  11/29/23 102/70  08/22/23 130/70   Wt Readings from Last 3 Encounters:  11/30/23 129 lb 3.2 oz (58.6 kg)  11/29/23 127 lb (57.6 kg)  08/22/23 133 lb 3.2 oz (60.4 kg)      Physical Exam Vitals reviewed.  Constitutional:      General: She is not in acute distress.    Appearance: She is normal weight. She is not ill-appearing, toxic-appearing or diaphoretic.  Musculoskeletal:     Right lower leg: No edema.     Left lower leg: No edema.     Right foot: Normal range of motion. No swelling.     Left foot: Normal range of motion. No swelling.     Comments: 2/4 DP and PT  pulses present bilaterally Tenderness along the plantar surface from the calcaneal bone to the base of the MTP.   Collapse of the lateral arch present bilaterally  No pain with passive dorsiflexion of the B/L feet.  Plantar flexion and dorsiflexion 5/5 strength B/L No cuts, rashes, or injury appreciated.   Skin:    General: Skin is warm and dry.  Neurological:     Mental Status: She is alert.     Gait: Gait is intact.  Psychiatric:        Mood and Affect: Mood normal.      No results found for any visits on 11/30/23.  Last metabolic panel Lab Results  Component Value Date   GLUCOSE 70 11/29/2023   NA 137 11/29/2023   K 4.6 11/29/2023   CL 104 11/29/2023   CO2 28 11/29/2023   BUN 10 11/29/2023   CREATININE 0.73 11/29/2023   GFRNONAA >60 07/30/2023   CALCIUM  10.7 (H) 11/29/2023   PHOS 3.9 11/29/2023   PROT 7.0 07/30/2023   ALBUMIN 3.9 07/30/2023   BILITOT 0.8 07/30/2023   ALKPHOS 92 07/30/2023   AST 19 07/30/2023   ALT 17 07/30/2023   ANIONGAP 7 07/30/2023  The 10-year ASCVD risk score (Arnett DK, et al., 2019) is: 4.8%    Assessment & Plan:   Problem List Items Addressed This Visit       Other   Flat feet, bilateral   Patient presents with a 1 week history of bilateral plantar burning foot pain that started at work.  She reported that whenever she is standing on her feet for more hours or more at work, she begins to have bilateral burning of the plantar surfaces.  She currently wears work shoes with insoles that she purchased over-the-counter.  Her pain is better with rest and she denies symptoms at this time.  She denies any sensation changes, changes in gait, changes in strength.  She does not have pain first thing in the morning, but she does feel that her feet feel sore whenever she first gets out of bed.  On exam:  2/4 DP and PT pulses present bilaterally. There is tenderness along the plantar surface from the calcaneal bone to the base of the MTP.  Collapse of the lateral arch present bilaterally and no pain with passive dorsiflexion of the B/L feet. Plantar flexion and dorsiflexion 5/5 strength B/L. No cuts, rashes, or injury appreciated.   I suspect that patient's bilateral plantar pain is predominantly due to collapse of her lateral arch resulting in flatfeet.  Patient likely has the beginning of plantar fasciitis as well.  Plan: - Provided patient with stretches -Continue supportive care until she is able to see podiatry -Podiatry referral placed for professional orthotics      Relevant Orders   Ambulatory referral to Podiatry   Other Visit Diagnoses       Encounter for immunization    -  Primary   Relevant Orders   Flu vaccine trivalent PF, 6mos and older(Flulaval,Afluria,Fluarix,Fluzone) (Completed)     Plantar fasciitis, bilateral           Return in about 2 weeks (around 12/14/2023) for Concerns of weight loss and chronic conditions .    Damien Lease, DO

## 2023-11-30 NOTE — Patient Instructions (Signed)
 Thank you, [name] for allowing us  to provide your care today. Today we discussed feet pain. I believe your pain is due to flat feet and plantar fascitis. Please see a podiatrist.   You may use ibuprofen  as needed for pain and tylenol  oral. Try voltaren  gel as well.    Referrals ordered today:    Referral Orders         Ambulatory referral to Podiatry      I have ordered the following medication/changed the following medications:   Stop the following medications: There are no discontinued medications.   Start the following medications: No orders of the defined types were placed in this encounter.    Follow up: 2-4 weeks to discuss the weight loss    Should you have any questions or concerns please call the internal medicine clinic at [phone].     Please note that our late policy has changed.  If you are more than 15 minutes late to your appointment, you may be asked to reschedule your appointment.  Dr. Kandis, D.O. [facility]

## 2023-11-30 NOTE — Assessment & Plan Note (Signed)
 Patient presents with a 1 week history of bilateral plantar burning foot pain that started at work.  She reported that whenever she is standing on her feet for more hours or more at work, she begins to have bilateral burning of the plantar surfaces.  She currently wears work shoes with insoles that she purchased over-the-counter.  Her pain is better with rest and she denies symptoms at this time.  She denies any sensation changes, changes in gait, changes in strength.  She does not have pain first thing in the morning, but she does feel that her feet feel sore whenever she first gets out of bed.  On exam:  2/4 DP and PT pulses present bilaterally. There is tenderness along the plantar surface from the calcaneal bone to the base of the MTP. Collapse of the lateral arch present bilaterally and no pain with passive dorsiflexion of the B/L feet. Plantar flexion and dorsiflexion 5/5 strength B/L. No cuts, rashes, or injury appreciated.   I suspect that patient's bilateral plantar pain is predominantly due to collapse of her lateral arch resulting in flatfeet.  Patient likely has the beginning of plantar fasciitis as well.  Plan: - Provided patient with stretches -Continue supportive care until she is able to see podiatry -Podiatry referral placed for professional orthotics

## 2023-11-30 NOTE — Telephone Encounter (Signed)
 Patient states she wears steel toe boots to work and works in a warehouse for long hours.Her first appointment is on 9/25. She wants to know what she can do prior to the appointment that could help with the pain. She has tried insoles. She mention if it is possible to get a note from provider, her job will allow her to wear caps on her regular shoes .Is this something you could do prior to seeing her ?

## 2023-12-03 ENCOUNTER — Telehealth: Payer: Self-pay | Admitting: Podiatry

## 2023-12-03 ENCOUNTER — Encounter: Payer: Self-pay | Admitting: Podiatry

## 2023-12-03 NOTE — Telephone Encounter (Signed)
 Pt req note for work for her to wear caps on her shoes to help with the pain she is having. Sent mess via MyChart to adv her she can get note by 4pm today at [facility] office. I adv her of office hours as well.

## 2023-12-05 ENCOUNTER — Encounter (HOSPITAL_COMMUNITY): Payer: Self-pay | Admitting: Psychiatry

## 2023-12-05 ENCOUNTER — Telehealth (INDEPENDENT_AMBULATORY_CARE_PROVIDER_SITE_OTHER): Payer: MEDICAID | Admitting: Psychiatry

## 2023-12-05 ENCOUNTER — Telehealth: Payer: Self-pay | Admitting: Podiatry

## 2023-12-05 DIAGNOSIS — F3130 Bipolar disorder, current episode depressed, mild or moderate severity, unspecified: Secondary | ICD-10-CM

## 2023-12-05 DIAGNOSIS — F411 Generalized anxiety disorder: Secondary | ICD-10-CM | POA: Diagnosis not present

## 2023-12-05 MED ORDER — DIVALPROEX SODIUM 500 MG PO DR TAB: 500.0000 mg | DELAYED_RELEASE_TABLET | Freq: Three times a day (TID) | ORAL | 3 refills | Status: DC

## 2023-12-05 MED ORDER — QUETIAPINE FUMARATE 25 MG PO TABS: 75.0000 mg | ORAL_TABLET | Freq: Every day | ORAL | 3 refills | Status: AC

## 2023-12-05 MED ORDER — HYDROXYZINE HCL 25 MG PO TABS: 25.0000 mg | ORAL_TABLET | Freq: Three times a day (TID) | ORAL | 3 refills | Status: AC | PRN

## 2023-12-05 NOTE — Progress Notes (Signed)
 [facility] MD/PA/NP OP Progress Note Virtual Visit via Video Note  I connected with Mary [name] on 12/05/23 at  2:30 PM EDT by a video enabled telemedicine application and verified that I am speaking with the correct person using two identifiers.  Location: Patient: Home Provider: Clinic   I discussed the limitations of evaluation and management by telemedicine and the availability of in person appointments. The patient expressed understanding and agreed to proceed.  I provided 30 minutes of non-face-to-face time during this encounter.       12/05/2023 4:02 PM Mary [name]  MRN:  [id number]  Chief Complaint:   I have not been taking the Depakote   HPI: 58 year old female seen today for follow up psychiatric evaluation. 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 She is currently being managed on Trintellix  10 mg, hydroxyzine  10 mg three times daily as needed, Depakote  500 mg three times daily, and  Seroquel  50 mg nightly.  She informed Clinical research associate that she is not taking Depakote  in over 3 months.  She also notes that she never started Trintellix  as it was too expensive.  She reports that her other medications are somewhat effective in managing her psychiatric conditions.  Today she was well groomed, pleasant, cooperative, and engaged in conversation. She informed Clinical research associate that she no longer takes her Depakote . She notes that she has not noticed much of a difference since stopping it.  Provider asked patient if she has been having racing thoughts, fluctuations of mood, and increased irritability.  She does report that at times her mind races, she snaps at people at work and does note that her mood fluctuates.  She however reports she is uncertain if her job stress is the cause of her irritability.  Patient notes  that she continues to work at [employer]. She finds enjoyment in her job but notes that she stands all day and now has plantar fasciitis.   Patient informed Clinical research associate that she continues to have increased anxiety and depression.  Provider conducted the GAD-7 the patient scored a 21.  Provider also conducted PHQ-9 to be scored a 16. Patient reported that her appetite is poor.  She reports that she has some GI issues noted that at one point she had to get her esophagus stretched.  She denies SI/HI/VAH, mania or paranoia.    To cope patient notes that she continues to smokes tobacco.  Patient is not interested in Nicorette patch or gum.  At this time Trintellix  discontinued as patient cannot afford it.  She does note that she would like to restart Depakote  to help manage her mood.  Depakote  500 mg 3 times daily reordered.  Seroquel  50 mg increased to 75 mg to help manage anxiety, depression, and sleep.  She will continue hydroxyzine  as prescribed. Visit Diagnosis:    ICD-10-CM   1. GAD (generalized anxiety disorder)  F41.1 QUEtiapine  (SEROQUEL ) 25 MG tablet    hydrOXYzine  (ATARAX ) 25 MG tablet    2. Bipolar I disorder, most recent episode depressed (HCC)  F31.30 QUEtiapine  (SEROQUEL ) 25 MG tablet    divalproex  (DEPAKOTE ) 500 MG DR tablet            Past Psychiatric History: cocaine dependence (in remission), marijuana dependence (in remission), borderline, schizophrenia substance induced mood disorder, PTSD, and poly substance use  Past Medical History:  Past Medical History:  Diagnosis  Date   Bipolar depression (HCC)    Borderline personality disorder (HCC)    COPD (chronic obstructive pulmonary disease) (HCC)    Depression    GERD (gastroesophageal reflux disease)    Hiatal hernia    History of alcohol abuse    per pt none since 10/ 2014   History of attempted suicide    History of drug abuse in remission (HCC)    per pt in remission since 12-26-2012  polysubstance dependence  (alcohol, crack, cocaine, 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   History of hepatitis C per pt first dx 2009 (approx)   consulted w/ dr comer (infectious disease) note 04-26-2016 , pt states was called and told per lab work no longer has hepatitis c    History of kidney stones    Hypercalcemia    mild and long-standing--- pt asymptomatic   Hyperlipidemia    Nausea and vomiting 07/04/2011   Primary hyperparathyroidism    endocrinology --  dr von   Pruritic erythematous rash    lower legs   PTSD (post-traumatic stress disorder)    Right ureteral stone    Schizophrenia (HCC)    Substance abuse (HCC)    Vitamin D  deficiency     Past Surgical History:  Procedure Laterality Date   COLONOSCOPY  12/08/2022   at [facility] with Veena Nandigam at same time as egd   CYSTOSCOPY N/A 01/10/2017   Procedure: CYSTOSCOPY;  Surgeon: Lorence Ozell CROME, MD;  Location: [facility];  Service: Gynecology;  Laterality: N/A;   CYSTOSCOPY W/ URETERAL STENT PLACEMENT Right 07/14/2016   Procedure: CYSTOSCOPY WITH STENT REPLACEMENT;  Surgeon: Sherrilee Belvie CROME, MD;  Location: [facility];  Service: Urology;  Laterality: Right;   CYSTOSCOPY WITH RETROGRADE PYELOGRAM, URETEROSCOPY AND STENT PLACEMENT Right 06/28/2016   Procedure: CYSTOSCOPY WITH RETROGRADE PYELOGRAM, URETEROSCOPY AND STENT PLACEMENT;  Surgeon: Belvie CROME Sherrilee, MD;  Location: [facility];  Service: Urology;  Laterality: Right;   CYSTOSCOPY/RETROGRADE/URETEROSCOPY/STONE EXTRACTION WITH BASKET Right 07/14/2016   Procedure: CYSTOSCOPY/RETROGRADE/URETEROSCOPY/STONE EXTRACTION WITH BASKET;  Surgeon: Sherrilee Belvie CROME, MD;  Location: [facility];  Service: Urology;  Laterality: Right;   DIAGNOSTIC LAPAROSCOPIC LIVER BIOPSY N/A 02/10/2013   Procedure: DIAGNOSTIC LAPAROSCOPIC ;  Surgeon: Elspeth KYM Schultze, MD;  Location: [facility];  Service: General;  Laterality: N/A;  DIAGNOSTIC LAPAROSCOPY,laparoscopic ventral hernia repair with mesh  lysis of adhesions   HERNIA REPAIR     HOLMIUM LASER APPLICATION Right 07/14/2016   Procedure: HOLMIUM LASER APPLICATION;  Surgeon: Sherrilee Belvie CROME, MD;  Location[facility];  Service: Urology;  Laterality: Right;   SPLENECTOMY  2004   TUBAL LIGATION     UPPER GASTROINTESTINAL ENDOSCOPY  12/08/2022   with colonoscopy at [facility] , Veena Nandigam, Hiatal Hernia, esophageal stricture, dilation,   VAGINAL HYSTERECTOMY N/A 01/10/2017   Procedure: HYSTERECTOMY VAGINAL W/ BILATERAL SALPINGECTOMY WITH REPAIR OF INCIDENTAL CYSTOTOMY;  Surgeon: Lorence Ozell CROME, MD;  Location: [facility];  Service: Gynecology;  Laterality: N/A;    Family Psychiatric History: Mother heroin use (over dosed and died), sister alcoholic, sister cocaine abuse, and maternal grandmother alcohol use. Five children were born addicted to cocaine.  Family History:  Family History  Problem Relation Age of Onset   Alcohol abuse Mother    Alcohol abuse Father    Alcohol abuse Sister    Colon cancer Maternal Grandmother        colon   Diabetes Maternal Grandmother    Hypertension Maternal  Grandmother    Thyroid  disease Neg Hx    Stomach cancer Neg Hx    Esophageal cancer Neg Hx    Rectal cancer Neg Hx     Social History:  Social History   Socioeconomic History   Marital status: Single    Spouse name: Not on file   Number of children: 5   Years of education: Not on file   Highest education level: Not on file  Occupational History   Not on file  Tobacco Use   Smoking status: Every Day    Current packs/day: 1.00    Average packs/day: 1 pack/day for 32.0 years (32.0 ttl pk-yrs)    Types: Cigarettes   Smokeless tobacco: Never  Vaping Use   Vaping status: Never Used  Substance and Sexual Activity   Alcohol use: Not Currently    Comment: quit 12/2012   Drug use: Not Currently    Types: Crack cocaine, Marijuana, Cocaine    Comment: per pt quit 12-26-2012 marijuana 2 times a week   Sexual activity:  Yes    Birth control/protection: Surgical, Post-menopausal  Other Topics Concern   Not on file  Social History Narrative   Not on file   Social Drivers of Health   Financial Resource Strain: Not on file  Food Insecurity: Not on file  Transportation Needs: Not on file  Physical Activity: Not on file  Stress: Not on file  Social Connections: Not on file    Allergies: No Known Allergies  Metabolic Disorder Labs: Lab Results  Component Value Date   HGBA1C 5.5 11/08/2022   No results found for: PROLACTIN Lab Results  Component Value Date   CHOL 201 (H) 11/08/2022   TRIG 57 11/08/2022   HDL 73 11/08/2022   CHOLHDL 2.8 11/08/2022   LDLCALC 118 (H) 11/08/2022   Lab Results  Component Value Date   TSH 1.275 10/04/2022    Therapeutic Level Labs: No results found for: LITHIUM Lab Results  Component Value Date   VALPROATE <10 (L) 07/30/2023   No results found for: CBMZ  Current Medications: Current Outpatient Medications  Medication Sig Dispense Refill   cyclobenzaprine  (FLEXERIL ) 5 MG tablet Take 1 tablet (5 mg total) by mouth 3 (three) times daily as needed for muscle spasms. 30 tablet 0   diclofenac  Sodium (VOLTAREN ) 1 % GEL Apply 4 g topically 4 (four) times daily. 120 g 0   divalproex  (DEPAKOTE ) 500 MG DR tablet Take 1 tablet (500 mg total) by mouth 3 (three) times daily. 90 tablet 3   hydrOXYzine  (ATARAX ) 25 MG tablet Take 1 tablet (25 mg total) by mouth 3 (three) times daily as needed. 90 tablet 3   ondansetron  (ZOFRAN ) 4 MG tablet Take 1 tablet (4 mg total) by mouth every 8 (eight) hours as needed for nausea or vomiting. 30 tablet 0   pantoprazole  (PROTONIX ) 40 MG tablet Take 1 tablet (40 mg total) by mouth 2 (two) times daily. 30 tablet 1   pantoprazole  (PROTONIX ) 40 MG tablet Take 1 tablet (40 mg total) by mouth daily. 30 tablet 3   prochlorperazine  (COMPAZINE ) 10 MG tablet Take 1 tablet (10 mg total) by mouth 2 (two) times daily as needed for nausea or  vomiting. 10 tablet 0   QUEtiapine  (SEROQUEL ) 25 MG tablet Take 3 tablets (75 mg total) by mouth at bedtime. 90 tablet 3   rosuvastatin  (CRESTOR ) 10 MG tablet Take 1 tablet (10 mg total) by mouth daily. 90 tablet 1   sucralfate  (CARAFATE )  1 g tablet Take 1 tablet (1 g total) by mouth 4 (four) times daily. Take 10 mLs (1 g total) by mouth 4 (four) times daily - with meals and at bedtime. 120 tablet 0   No current facility-administered medications for this visit.     Musculoskeletal: Strength & Muscle Tone: within normal limits and Telehealth visit Gait & Station: normal,  telehealth visit Patient leans: N/A  Psychiatric Specialty Exam: Review of Systems  There were no vitals taken for this visit.There is no height or weight on file to calculate BMI.  General Appearance: Well Groomed  Eye Contact:  Good  Speech:  Clear and Coherent and Normal Rate  Volume:  Normal  Mood:  Anxious and Depressed  Affect:  Appropriate and Congruent  Thought Process:  Coherent, Goal Directed and Linear  Orientation:  Full (Time, Place, and Person)  Thought Content: WDL and Logical   Suicidal Thoughts:  No  Homicidal Thoughts:  No  Memory:  Immediate;   Good Recent;   Good Remote;   Good  Judgement:  Good  Insight:  Good  Psychomotor Activity:  Normal  Concentration:  Concentration: Good and Attention Span: Good  Recall:  Good  Fund of Knowledge: Good  Language: Good  Akathisia:  No  Handed:  Right  AIMS (if indicated): Not done  Assets:  Communication Skills Desire for Improvement Financial Resources/Insurance Housing Social Support  ADL's:  Intact  Cognition: WNL  Sleep:  Good   Screenings: AIMS    Flowsheet Row Video Visit from 10/23/2023 in [facility]  AIMS Total Score 0   GAD-7    Flowsheet Row Video Visit from 12/05/2023 in [facility] Video Visit from 10/23/2023 in [facility] Video Visit  from 07/31/2023 in [facility] Video Visit from 03/20/2023 in [facility] Video Visit from 12/27/2022 in [facility]  Total GAD-7 Score 21 20 20 19 21    PHQ2-9    Flowsheet Row Video Visit from 12/05/2023 in [facility] Video Visit from 10/23/2023 in [facility] Video Visit from 07/31/2023 in [facility] Office Visit from 03/28/2023 in [facility] Video Visit from 03/20/2023 in [facility]  PHQ-2 Total Score 4 6 4  0 2  PHQ-9 Total Score 16 19 16  -- 8   Flowsheet Row Video Visit from 10/23/2023 in [facility] Video Visit from 07/31/2023 in [facility] ED from 07/30/2023 in [facility] Emergency Department at [facility]  C-SSRS RISK CATEGORY Moderate Risk Error: Q3, 4, or 5 should not be populated when Q2 is No No Risk     Assessment and Plan: Patient notes that her anxiety, depression, and mood has is problematic.  She has also been more irritable, distracted, having racing thoughts.  She does note that some of her issues stem from stressors at work.  Patient has been without Depakote  for 3 months.At this time Trintellix  discontinued as patient cannot afford it.  She does note that she would like to restart Depakote  to help manage her mood.  Depakote  500 mg 3 times daily reordered.  Seroquel  50 mg increased to 75 mg to help manage anxiety, depression, and sleep.  1. GAD (generalized anxiety disorder)  Increase- QUEtiapine  (SEROQUEL ) 25 MG tablet; Take  3 tablets (75 mg total) by mouth at bedtime.  Dispense: 90 tablet; Refill: 3 Continue- hydrOXYzine  (ATARAX ) 25 MG tablet; Take 1 tablet (25 mg total) by mouth 3 (three) times daily as needed.  Dispense: 90 tablet; Refill: 3  2. Bipolar  I disorder, most recent episode depressed (HCC)  Increase- QUEtiapine  (SEROQUEL ) 25 MG tablet; Take 3 tablets (75 mg total) by mouth at bedtime.  Dispense: 90 tablet; Refill: 3 Restart- divalproex  (DEPAKOTE ) 500 MG DR tablet; Take 1 tablet (500 mg total) by mouth 3 (three) times daily.  Dispense: 90 tablet; Refill: 3  Follow up in 2 months   Follow up with therapy   Zane FORBES Bach, NP 12/05/2023, 4:02 PM

## 2023-12-05 NOTE — Telephone Encounter (Signed)
 Patient states she is experiencing burning pain in her feet. Is there a sock or anything she can do to relieve this pain?

## 2023-12-06 ENCOUNTER — Encounter: Payer: Self-pay | Admitting: Podiatry

## 2023-12-06 ENCOUNTER — Ambulatory Visit (INDEPENDENT_AMBULATORY_CARE_PROVIDER_SITE_OTHER): Payer: MEDICAID | Admitting: Podiatry

## 2023-12-06 ENCOUNTER — Ambulatory Visit (INDEPENDENT_AMBULATORY_CARE_PROVIDER_SITE_OTHER): Payer: MEDICAID

## 2023-12-06 DIAGNOSIS — M2012 Hallux valgus (acquired), left foot: Secondary | ICD-10-CM

## 2023-12-06 DIAGNOSIS — M722 Plantar fascial fibromatosis: Secondary | ICD-10-CM

## 2023-12-06 DIAGNOSIS — M2011 Hallux valgus (acquired), right foot: Secondary | ICD-10-CM

## 2023-12-06 MED ORDER — PREDNISONE 5 MG PO TABS: ORAL_TABLET | ORAL | 0 refills | Status: AC

## 2023-12-06 NOTE — Progress Notes (Signed)
 Patient presents today complaining of pain along the arch of the foot and the heel.  Has been bothering her for several weeks now.  Did undergo any specific injury.  She works on her feet and has to wear steel toed shoes.  Also pain around first metatarsal phalangeal joint more on the left than on the right.   Physical exam:  General appearance: Pleasant, and in no acute distress. AOx3.  Vascular: Pedal pulses: DP 2 to/4 bilaterally, PT 2/4 bilaterally.  Minimal edema lower legs bilaterally. Capillary fill time meed bilaterally.  Neurological: Light touch intact feet bilaterally.  Normal Achilles reflex bilaterally.  No clonus or spasticity noted.  Negative Tinel's sign tarsal tunnel and porta pedis bilaterally  Dermatologic:   Skin normal temperature bilaterally.  Skin normal color, tone, and texture bilaterally.   Musculoskeletal: This along the plantar fascial band from the toes to the heel.  Tenderness at the plantar medial aspect of the calcaneus at the medial plantar calcaneal tubercle.  No tenderness lateral compression of the calcaneus.  Hallux valgus deformities bilaterally.  Tenderness to the  Radiographs: 3 views foot bilaterally: Mild osteophytic changes plantar aspect calcaneus.  Moderate to severe hallux abductovalgus deformity.  Normal bone density no ascending fractures or dislocations.  No evidence of any bone tumors.  Diagnosis: 1.  Plantar fasciitis bilaterally 2.  Hallux abductovalgus deformity bilaterally  Plan: -New patient office visit for evaluation and management level 3. - Discussed with the patient plantar fasciitis and etiology and treatment.  Also discussed the hallux valgus and the etiology and treatment.  Recommended good supportive shoes with a wide toebox.  She can wear a toe On a new balance or Hoka shoe at work.  Will do a dose of 12-day taper of prednisone . Avoid flat soled shoes or going in bare feet. - Rx prednisone  5 mg, 30 mg p.o. daily first  day, then decrease by 5 mg every other day for 12 days. Gave written instructions on at home therapy she can do.  Return 2 weeks follow-up plantar fasciitis

## 2023-12-06 NOTE — Patient Instructions (Signed)

## 2023-12-07 ENCOUNTER — Ambulatory Visit: Payer: Self-pay | Admitting: Endocrinology

## 2023-12-07 ENCOUNTER — Other Ambulatory Visit: Payer: Self-pay | Admitting: Endocrinology

## 2023-12-07 ENCOUNTER — Telehealth: Payer: Self-pay

## 2023-12-07 DIAGNOSIS — M818 Other osteoporosis without current pathological fracture: Secondary | ICD-10-CM

## 2023-12-07 DIAGNOSIS — M810 Age-related osteoporosis without current pathological fracture: Secondary | ICD-10-CM | POA: Insufficient documentation

## 2023-12-07 NOTE — Telephone Encounter (Signed)
 Dr. Mercie, patient will be scheduled as soon as possible.  Auth Submission: NO AUTH NEEDED Site of care: Site of care: [facility] Payer: [employer] medicaid Medication & CPT/J Code(s) submitted: Reclast (Zolendronic acid) [id number] Diagnosis Code:  Route of submission (phone, fax, portal):  Phone # Fax # Auth type: Buy/Bill PB Units/visits requested: 5mg  x 1 dose Reference number:  Approval from: 12/07/23 to 03/12/24

## 2023-12-07 NOTE — Progress Notes (Signed)
 Patient given results and medication changes as directed by MD. No further questions at this time. Patient prefers to monitor and treat for osteoporosis

## 2023-12-08 NOTE — Progress Notes (Signed)
 Internal Medicine Clinic Attending  Case discussed with the resident at the time of the visit.  We reviewed the resident's history and exam and pertinent patient test results.  I agree with the assessment, diagnosis, and plan of care documented in the resident's note.

## 2023-12-12 ENCOUNTER — Ambulatory Visit
Admission: EM | Admit: 2023-12-12 | Discharge: 2023-12-12 | Disposition: A | Discharge status: Home / Self Care | Payer: MEDICAID

## 2023-12-12 DIAGNOSIS — M722 Plantar fascial fibromatosis: Secondary | ICD-10-CM | POA: Diagnosis not present

## 2023-12-12 DIAGNOSIS — G5793 Unspecified mononeuropathy of bilateral lower limbs: Secondary | ICD-10-CM

## 2023-12-12 MED ORDER — GABAPENTIN 300 MG PO CAPS: 300.0000 mg | ORAL_CAPSULE | Freq: Every day | ORAL | 0 refills | Status: AC

## 2023-12-12 NOTE — Discharge Instructions (Addendum)
 You were seen today for severe pain in both feet from plantar fasciitis. Even though you are on a prednisone  taper prescribed by podiatry, you continue to have burning pain on the bottoms of your feet and between your toes, making it very difficult to walk. To help with the burning sensations, you have been prescribed gabapentin 300 mg to take at night in addition to continuing your prednisone  as directed. A supportive post-op shoe was given to help reduce pressure and provide stability. A note was provided to excuse you from work for three days. If you need more time off, you will need to return here, follow up with your primary care provider, or request additional time from podiatry. You are scheduled to come back on Sunday for reassessment. Please keep your podiatry appointment on the 10th as planned. Go to the emergency room right away if you are unable to walk at all, notice sudden swelling or redness in your legs, develop fever, or experience any other sudden or concerning symptoms.

## 2023-12-12 NOTE — ED Triage Notes (Signed)
 Patient reports bilateral plantar fasciitis and has a scheduled podiatry appointment on the 10th. Works overnight shifts at [employer], which involve prolonged standing. Reports increasing foot pain during and especially after shifts. Has a history of osteoporosis and expresses concern about potentially losing employment due to worsening symptoms. Currently on a prednisone  taper prescribed by podiatry. Patient is requesting intervention, such as a work note or any accommodations that may assist in managing symptoms.

## 2023-12-12 NOTE — ED Provider Notes (Signed)
 [facility]    CSN: [id number] Arrival date & time: 12/12/23  1313      History   Chief Complaint Chief Complaint  Patient presents with   Foot Problem    HPI Mary [name] is a 58 y.o. female.   Discussed the use of AI scribe software for clinical note transcription with the patient, who gave verbal consent to proceed.   Patient presents with bilateral foot pain. The patient was previously diagnosed with plantar fasciitis and saw podiatry on 09/25. At that visit, a 12-day prednisone  taper was done which she is currently on. Despite treatment, the patient reports she can hardly walk due to the burning pain, which is located on the bottom of her feet and between her toes. The pain is significantly impacting her ability to work, with concerns about potential job loss due to production issues related to her condition. She has an upcoming appointment with a podiatrist on the 10th, which was moved up from the 14th due to the severity of her symptoms. Her podiatrist advised that sh wear good supportive shoes with a wide toe box such as new balance or Hoka. She is currently wearing Skechers and is considering purchasing the [employer] when she gets more money to afford them. She has not taken any medications other than prednisone . She has taken motrin  in the past for this but reports that it was ineffective in relieving her pain. Patient discloses that she is currently 15+ years Clean from history of substance abuse. Patient is currently requesting that a work note be provided to keep her out of work until she sees Ar[facility] on the 10th.   The following sections of the patient's history were reviewed and updated as appropriate: allergies, current medications, past family history, past medical history, past social history, past surgical history, and problem list.        Past Medical History:  Diagnosis Date   Bipolar depression (HCC)    Borderline personality disorder (HCC)     COPD (chronic obstructive pulmonary disease) (HCC)    Depression    GERD (gastroesophageal reflux disease)    Hiatal hernia    History of alcohol abuse    per pt none since 10/ 2014   History of attempted suicide    History of drug abuse in remission (HCC)    per pt in remission since 12-26-2012  polysubstance dependence (alcohol, crack, cocaine, 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   History of hepatitis C per pt first dx 2009 (approx)   consulted w/ dr comer (infectious disease) note 04-26-2016 , pt states was called and told per lab work no longer has hepatitis c    History of kidney stones    Hypercalcemia    mild and long-standing--- pt asymptomatic   Hyperlipidemia    Nausea and vomiting 07/04/2011   Primary hyperparathyroidism    endocrinology --  dr von   Pruritic erythematous rash    lower legs   PTSD (post-traumatic stress disorder)    Right ureteral stone    Schizophrenia (HCC)    Substance abuse (HCC)    Vitamin D  deficiency     Patient Active Problem List   Diagnosis Date Noted   Osteoporosis 12/07/2023   Flat feet, bilateral 11/30/2023   Chronic low back pain 03/28/2023   Immunization due 03/28/2023   Constipation 03/28/2023   Primary hyperparathyroidism 12/13/2022   Hyperlipidemia 12/13/2022   Tobacco dependence 12/06/2020   Chronic hepatitis C without hepatic coma (  HCC) 05/07/2020   Drug abuse and dependence (HCC) 05/07/2020   Bipolar 1 disorder, depressed (HCC) 11/11/2019   Post-traumatic stress disorder, unspecified 07/24/2018   Cannabis dependence, uncomplicated (HCC) 07/24/2018   Bipolar disord, crnt epsd depress, severe, w psych features (HCC) 07/24/2018   Nicotine  dependence, unspecified, uncomplicated 07/24/2018   Alcohol dependence, uncomplicated (HCC) 07/24/2018   Cocaine dependence in remission (HCC) 07/24/2018   Personality disorder (HCC) 11/16/2017   Reactive airway disease without asthma 04/12/2016   Nausea and vomiting 07/04/2011     Past Surgical History:  Procedure Laterality Date   COLONOSCOPY  12/08/2022   at [facility] with Veena Nandigam at same time as egd   CYSTOSCOPY N/A 01/10/2017   Procedure: CYSTOSCOPY;  Surgeon: Lorence Ozell CROME, MD;  Location: [facility];  Service: Gynecology;  Laterality: N/A;   CYSTOSCOPY W/ URETERAL STENT PLACEMENT Right 07/14/2016   Procedure: CYSTOSCOPY WITH STENT REPLACEMENT;  Surgeon: Sherrilee Belvie CROME, MD;  Location: [facility];  Service: Urology;  Laterality: Right;   CYSTOSCOPY WITH RETROGRADE PYELOGRAM, URETEROSCOPY AND STENT PLACEMENT Right 06/28/2016   Procedure: CYSTOSCOPY WITH RETROGRADE PYELOGRAM, URETEROSCOPY AND STENT PLACEMENT;  Surgeon: Belvie CROME Sherrilee, MD;  Location: [facility];  Service: Urology;  Laterality: Right;   CYSTOSCOPY/RETROGRADE/URETEROSCOPY/STONE EXTRACTION WITH BASKET Right 07/14/2016   Procedure: CYSTOSCOPY/RETROGRADE/URETEROSCOPY/STONE EXTRACTION WITH BASKET;  Surgeon: Sherrilee Belvie CROME, MD;  Location: [facility];  Service: Urology;  Laterality: Right;   DIAGNOSTIC LAPAROSCOPIC LIVER BIOPSY N/A 02/10/2013   Procedure: DIAGNOSTIC LAPAROSCOPIC ;  Surgeon: Elspeth KYM Schultze, MD;  Location: [facility];  Service: General;  Laterality: N/A;  DIAGNOSTIC LAPAROSCOPY,laparoscopic ventral hernia repair with mesh lysis of adhesions   HERNIA REPAIR     HOLMIUM LASER APPLICATION Right 07/14/2016   Procedure: HOLMIUM LASER APPLICATION;  Surgeon: Sherrilee Belvie CROME, MD;  Location: [facility];  Service: Urology;  Laterality: Right;   SPLENECTOMY  2004   TUBAL LIGATION     UPPER GASTROINTESTINAL ENDOSCOPY  12/08/2022   with colonoscopy at [facility] , Veena Nandigam, Hiatal Hernia, esophageal stricture, dilation,   VAGINAL HYSTERECTOMY N/A 01/10/2017   Procedure: HYSTERECTOMY VAGINAL W/ BILATERAL SALPINGECTOMY WITH REPAIR OF INCIDENTAL CYSTOTOMY;  Surgeon: Lorence Ozell CROME, MD;  Location: [facility];  Service: Gynecology;  Laterality: N/A;     OB History     Gravida  5   Para      Term      Preterm      AB      Living  5      SAB      IAB      Ectopic      Multiple      Live Births  5            Home Medications    Prior to Admission medications   Medication Sig Start Date End Date Taking? Authorizing Provider  gabapentin (NEURONTIN) 300 MG capsule Take 1 capsule (300 mg total) by mouth at bedtime for 10 days. 12/12/23 12/22/23 Yes Iola Lukes, FNP  predniSONE  (DELTASONE ) 5 MG tablet 12 day taper: 6,6,5,5,4,4,3,3,2,2,1,1 12/06/23  Yes Petery, John, DPM  QUEtiapine  (SEROQUEL ) 50 MG tablet Take 50 mg by mouth at bedtime. 07/31/23  Yes [provider]  cyclobenzaprine  (FLEXERIL ) 5 MG tablet Take 1 tablet (5 mg total) by mouth 3 (three) times daily as needed for muscle spasms. Patient not taking: Reported on 12/06/2023 03/28/23   Tobie Gaines, DO  diclofenac  Sodium (VOLTAREN ) 1 % GEL Apply  4 g topically 4 (four) times daily. 11/30/23   Kandis Perkins, DO  divalproex  (DEPAKOTE ) 500 MG DR tablet Take 1 tablet (500 mg total) by mouth 3 (three) times daily. 12/05/23   Harl Zane BRAVO, NP  hydrOXYzine  (ATARAX ) 25 MG tablet Take 1 tablet (25 mg total) by mouth 3 (three) times daily as needed. 12/05/23   Harl Zane BRAVO, NP  ondansetron  (ZOFRAN ) 4 MG tablet Take 1 tablet (4 mg total) by mouth every 8 (eight) hours as needed for nausea or vomiting. 07/05/23   Tobie Gaines, DO  pantoprazole  (PROTONIX ) 40 MG tablet Take 1 tablet (40 mg total) by mouth 2 (two) times daily. 12/13/22 12/13/23  Tawkaliyar, Roya, DO  pantoprazole  (PROTONIX ) 40 MG tablet Take 1 tablet (40 mg total) by mouth daily. 08/22/23   Thapa, [place], MD  prochlorperazine  (COMPAZINE ) 10 MG tablet Take 1 tablet (10 mg total) by mouth 2 (two) times daily as needed for nausea or vomiting. 07/30/23   Patsey Lot, MD  QUEtiapine  (SEROQUEL ) 25 MG tablet Take 3 tablets (75 mg total) by mouth at bedtime. 12/05/23   Harl Zane BRAVO, NP   rosuvastatin  (CRESTOR ) 10 MG tablet Take 1 tablet (10 mg total) by mouth daily. 03/28/23 03/27/24  Tobie Gaines, DO  sucralfate  (CARAFATE ) 1 g tablet Take 1 tablet (1 g total) by mouth 4 (four) times daily. Take 10 mLs (1 g total) by mouth 4 (four) times daily - with meals and at bedtime. 12/13/22 12/06/23  Tawkaliyar, Roya, DO  fluticasone -salmeterol (ADVAIR HFA) 230-21 MCG/ACT inhaler Inhale 2 puffs into the lungs 2 (two) times daily. Rinse mouth after each use Patient not taking: Reported on 02/14/2017 07/04/16 07/25/19  Nche, Roselie [place], NP    Family History Family History  Problem Relation Age of Onset   Alcohol abuse Mother    Alcohol abuse Father    Alcohol abuse Sister    Colon cancer Maternal Grandmother        colon   Diabetes Maternal Grandmother    Hypertension Maternal Grandmother    Thyroid  disease Neg Hx    Stomach cancer Neg Hx    Esophageal cancer Neg Hx    Rectal cancer Neg Hx     Social History Social History   Tobacco Use   Smoking status: Every Day    Current packs/day: 1.00    Average packs/day: 1 pack/day for 32.0 years (32.0 ttl pk-yrs)    Types: Cigarettes   Smokeless tobacco: Never  Vaping Use   Vaping status: Never Used  Substance Use Topics   Alcohol use: Not Currently    Comment: quit 12/2012   Drug use: Not Currently    Types: Crack cocaine, Marijuana, Cocaine    Comment: per pt quit 12-26-2012 marijuana 2 times a week     Allergies   Patient has no known allergies.   Review of Systems Review of Systems  Musculoskeletal:  Positive for arthralgias and gait problem.  Skin:  Negative for wound.  Neurological:  Negative for numbness.  All other systems reviewed and are negative.    Physical Exam Triage Vital Signs ED Triage Vitals  Encounter Vitals Group     BP 12/12/23 1433 116/69     Girls Systolic BP Percentile --      Girls Diastolic BP Percentile --      Boys Systolic BP Percentile --      Boys Diastolic BP Percentile --       Pulse Rate 12/12/23 1433 (!) 59  Resp 12/12/23 1433 18     Temp 12/12/23 1433 97.8 F (36.6 C)     Temp Source 12/12/23 1433 Oral     SpO2 12/12/23 1433 97 %     Weight 12/12/23 1431 127 lb (57.6 kg)     Height 12/12/23 1431 5' 4.5 (1.638 m)     Head Circumference --      Peak Flow --      Pain Score 12/12/23 1426 7     Pain Loc --      Pain Education --      Exclude from Growth Chart --    No data found.  Updated Vital Signs BP 116/69 (BP Location: Left Arm)   Pulse (!) 59   Temp 97.8 F (36.6 C) (Oral)   Resp 18   Ht 5' 4.5 (1.638 m)   Wt 127 lb (57.6 kg)   LMP  (LMP Unknown)   SpO2 97%   BMI 21.46 kg/m   Visual Acuity Right Eye Distance:   Left Eye Distance:   Bilateral Distance:    Right Eye Near:   Left Eye Near:    Bilateral Near:     Physical Exam Vitals reviewed.  Constitutional:      General: She is awake. She is not in acute distress.    Appearance: Normal appearance. She is well-developed. She is not ill-appearing, toxic-appearing or diaphoretic.  HENT:     Head: Normocephalic.     Right Ear: Hearing normal.     Left Ear: Hearing normal.     Nose: Nose normal.     Mouth/Throat:     Mouth: Mucous membranes are moist.  Eyes:     General: Vision grossly intact.     Conjunctiva/sclera: Conjunctivae normal.  Cardiovascular:     Rate and Rhythm: Normal rate and regular rhythm.     Pulses:          Dorsalis pedis pulses are 2+ on the right side and 2+ on the left side.     Heart sounds: Normal heart sounds.  Pulmonary:     Effort: Pulmonary effort is normal.     Breath sounds: Normal breath sounds and air entry.  Musculoskeletal:        General: Normal range of motion.     Cervical back: Normal range of motion and neck supple.     Right foot: Normal range of motion.     Left foot: Normal range of motion.  Feet:     Right foot:     Skin integrity: Callus (lateral great toe) present.     Toenail Condition: Right toenails are normal.      Left foot:     Skin integrity: Callus (lateral great toe) present.     Toenail Condition: Left toenails are normal.     Comments: Tenderness noted along the plantar fascial band from the toes to the heel bilaterally. Hallux valgus deformities bilaterally Skin:    General: Skin is warm and dry.  Neurological:     General: No focal deficit present.     Mental Status: She is alert and oriented to person, place, and time.  Psychiatric:        Speech: Speech normal.        Behavior: Behavior is cooperative.      UC Treatments / Results  Labs (all labs ordered are listed, but only abnormal results are displayed) Labs Reviewed - No data to display  EKG   Radiology No results  found.  Procedures Procedures (including critical care time)  Medications Ordered in UC Medications - No data to display  Initial Impression / Assessment and Plan / UC Course  I have reviewed the triage vital signs and the nursing notes.  Pertinent labs & imaging results that were available during my care of the patient were reviewed by me and considered in my medical decision making (see chart for details).    The patient presents with bilateral plantar fasciitis with neuropathic burning pain. She was previously diagnosed by podiatry and is currently on a 12-day prednisone  taper prescribed on 12/06/23. Despite treatment, she reports severe burning pain on the bottoms of her feet and between her toes, describing the discomfort as so intense that she can hardly walk. She has a follow-up appointment with podiatry scheduled on the 10th.  Gabapentin 300 mg nightly is being added to her current prednisone  regimen to address the neuropathic burning pain. A post-op shoe with a firm base was provided for additional support. The patient was counseled that only a three-day work excuse can be provided from this visit, and any additional time off will need to be obtained either by returning here, through her primary care  provider, or from podiatry. A return visit was scheduled for Sunday for reassessment. She was advised to continue the prednisone  taper as prescribed and to keep her podiatry appointment on the 10th as scheduled.  Today's evaluation has revealed no signs of a dangerous process. Discussed diagnosis with patient and/or guardian. Patient and/or guardian aware of their diagnosis, possible red flag symptoms to watch out for and need for close follow up. Patient and/or guardian understands verbal and written discharge instructions. Patient and/or guardian comfortable with plan and disposition.  Patient and/or guardian has a clear mental status at this time, good insight into illness (after discussion and teaching) and has clear judgment to make decisions regarding their care  Documentation was completed with the aid of voice recognition software. Transcription may contain typographical errors.   Final Clinical Impressions(s) / UC Diagnoses   Final diagnoses:  Bilateral plantar fasciitis  Neuropathy of both feet     Discharge Instructions      You were seen today for severe pain in both feet from plantar fasciitis. Even though you are on a prednisone  taper prescribed by podiatry, you continue to have burning pain on the bottoms of your feet and between your toes, making it very difficult to walk. To help with the burning sensations, you have been prescribed gabapentin 300 mg to take at night in addition to continuing your prednisone  as directed. A supportive post-op shoe was given to help reduce pressure and provide stability. A note was provided to excuse you from work for three days. If you need more time off, you will need to return here, follow up with your primary care provider, or request additional time from podiatry. You are scheduled to come back on Sunday for reassessment. Please keep your podiatry appointment on the 10th as planned. Go to the emergency room right away if you are unable to walk  at all, notice sudden swelling or redness in your legs, develop fever, or experience any other sudden or concerning symptoms.     ED Prescriptions     Medication Sig Dispense Auth. Provider   gabapentin (NEURONTIN) 300 MG capsule Take 1 capsule (300 mg total) by mouth at bedtime for 10 days. 10 capsule Iola Lukes, FNP      PDMP not reviewed this encounter.  Iola Lukes, [place] 12/12/23 1540

## 2023-12-16 ENCOUNTER — Encounter: Payer: Self-pay | Admitting: Emergency Medicine

## 2023-12-16 ENCOUNTER — Ambulatory Visit
Admission: EM | Admit: 2023-12-16 | Discharge: 2023-12-16 | Disposition: A | Discharge status: Home / Self Care | Payer: MEDICAID | Attending: Nurse Practitioner | Admitting: Nurse Practitioner

## 2023-12-16 DIAGNOSIS — K219 Gastro-esophageal reflux disease without esophagitis: Secondary | ICD-10-CM | POA: Diagnosis not present

## 2023-12-16 DIAGNOSIS — G5793 Unspecified mononeuropathy of bilateral lower limbs: Secondary | ICD-10-CM

## 2023-12-16 DIAGNOSIS — M722 Plantar fascial fibromatosis: Secondary | ICD-10-CM | POA: Diagnosis not present

## 2023-12-16 MED ORDER — ALUM & MAG HYDROXIDE-SIMETH 200-200-20 MG/5ML PO SUSP
30.0000 mL | Freq: Once | ORAL | Status: AC
  Administered 2023-12-16: 30 mL via ORAL

## 2023-12-16 MED ORDER — LIDOCAINE VISCOUS HCL 2 % MT SOLN
15.0000 mL | Freq: Once | OROMUCOSAL | Status: AC
  Administered 2023-12-16: 15 mL via OROMUCOSAL

## 2023-12-16 NOTE — Discharge Instructions (Signed)
 You were seen today for continued foot pain. You have been diagnosed with plantar fasciitis in the past and are currently finishing a prednisone  taper. Despite this, you are still experiencing burning pain on the bottoms of your feet and between your toes. You were started on gabapentin at your last visit, and it is important that you take this medication every night as prescribed, since it works best when taken consistently. The post-op shoe you were given is helping provide support, and you should continue wearing it as much as possible. You have a follow-up appointment with podiatry on October 10, where your feet can be reassessed and additional treatment options can be discussed. A work note has been provided today to support your job needs.  You also reported increased heartburn and reflux symptoms, even while taking Protonix . You were given a GI cocktail today in the clinic. Continue taking Protonix  as prescribed. Avoid caffeine , alcohol, fried or spicy foods, and do not lie down immediately after eating, as these can worsen reflux. Elevating the head of your bed at night may also help.  Please return for medical care sooner if your foot pain suddenly worsens, if you develop new redness, swelling, fever, or drainage from your feet, or if your reflux symptoms are associated with vomiting, blood in vomit, black or bloody stools, chest pain, or difficulty swallowing. Otherwise, keep your podiatry appointment on October 10 and follow up with your primary care provider for ongoing reflux management.

## 2023-12-16 NOTE — ED Provider Notes (Signed)
 [facility]    CSN: [id number] Arrival date & time: 12/16/23  1249      History   Chief Complaint Chief Complaint  Patient presents with   foot concern    HPI Mary [name] is a 58 y.o. female.   The patient presents with ongoing bilateral foot pain previously diagnosed as plantar fasciitis. She was evaluated by podiatry on 9/25 and prescribed a 12-day prednisone  taper, which she is currently completing. Despite this, she continues to experience burning pain on the plantar surfaces and between her toes, significantly impairing ambulation and impacting her ability to work. She was seen here on 10/1 and started on gabapentin 300 mg nightly in addition to supportive therapy with a post-op shoe, which she reports has provided partial relief. She admits inconsistent use of gabapentin and continues to have burning foot pain but denies worsening symptoms. She has a follow-up podiatry appointment scheduled for 10/10 and is requesting a work note at this visit.  She also reports worsening GERD symptoms despite compliance with Protonix . She denies vomiting, hematemesis, or melena. Supportive measures were discussed.  The following sections of the patient's history were reviewed and updated as appropriate: allergies, current medications, past family history, past medical history, past social history, past surgical history, and problem list.       Past Medical History:  Diagnosis Date   Bipolar depression (HCC)    Borderline personality disorder (HCC)    COPD (chronic obstructive pulmonary disease) (HCC)    Depression    GERD (gastroesophageal reflux disease)    Hiatal hernia    History of alcohol abuse    per pt none since 10/ 2014   History of attempted suicide    History of drug abuse in remission (HCC)    per pt in remission since 12-26-2012  polysubstance dependence (alcohol, crack, cocaine, 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   History of hepatitis C per pt  first dx 2009 (approx)   consulted w/ dr comer (infectious disease) note 04-26-2016 , pt states was called and told per lab work no longer has hepatitis c    History of kidney stones    Hypercalcemia    mild and long-standing--- pt asymptomatic   Hyperlipidemia    Nausea and vomiting 07/04/2011   Primary hyperparathyroidism    endocrinology --  dr von   Pruritic erythematous rash    lower legs   PTSD (post-traumatic stress disorder)    Right ureteral stone    Schizophrenia (HCC)    Substance abuse (HCC)    Vitamin D  deficiency     Patient Active Problem List   Diagnosis Date Noted   Osteoporosis 12/07/2023   Flat feet, bilateral 11/30/2023   Chronic low back pain 03/28/2023   Immunization due 03/28/2023   Constipation 03/28/2023   Primary hyperparathyroidism 12/13/2022   Hyperlipidemia 12/13/2022   Tobacco dependence 12/06/2020   Chronic hepatitis C without hepatic coma (HCC) 05/07/2020   Drug abuse and dependence (HCC) 05/07/2020   Bipolar 1 disorder, depressed (HCC) 11/11/2019   Post-traumatic stress disorder, unspecified 07/24/2018   Cannabis dependence, uncomplicated (HCC) 07/24/2018   Bipolar disord, crnt epsd depress, severe, w psych features (HCC) 07/24/2018   Nicotine  dependence, unspecified, uncomplicated 07/24/2018   Alcohol dependence, uncomplicated (HCC) 07/24/2018   Cocaine dependence in remission (HCC) 07/24/2018   Personality disorder (HCC) 11/16/2017   Reactive airway disease without asthma 04/12/2016   Nausea and vomiting 07/04/2011    Past Surgical History:  Procedure Laterality  Date   COLONOSCOPY  12/08/2022   at [facility] with Veena Nandigam at same time as egd   CYSTOSCOPY N/A 01/10/2017   Procedure: CYSTOSCOPY;  Surgeon: Lorence Ozell CROME, MD;  Location: [facility];  Service: Gynecology;  Laterality: N/A;   CYSTOSCOPY W/ URETERAL STENT PLACEMENT Right 07/14/2016   Procedure: CYSTOSCOPY WITH STENT REPLACEMENT;  Surgeon: Sherrilee Belvie CROME, MD;  Location:  [facility];  Service: Urology;  Laterality: Right;   CYSTOSCOPY WITH RETROGRADE PYELOGRAM, URETEROSCOPY AND STENT PLACEMENT Right 06/28/2016   Procedure: CYSTOSCOPY WITH RETROGRADE PYELOGRAM, URETEROSCOPY AND STENT PLACEMENT;  Surgeon: Belvie CROME Sherrilee, MD;  Location: [facility];  Service: Urology;  Laterality: Right;   CYSTOSCOPY/RETROGRADE/URETEROSCOPY/STONE EXTRACTION WITH BASKET Right 07/14/2016   Procedure: CYSTOSCOPY/RETROGRADE/URETEROSCOPY/STONE EXTRACTION WITH BASKET;  Surgeon: Sherrilee Belvie CROME, MD;  Location: [facility];  Service: Urology;  Laterality: Right;   DIAGNOSTIC LAPAROSCOPIC LIVER BIOPSY N/A 02/10/2013   Procedure: DIAGNOSTIC LAPAROSCOPIC ;  Surgeon: Elspeth KYM Schultze, MD;  Location: [facility];  Service: General;  Laterality: N/A;  DIAGNOSTIC LAPAROSCOPY,laparoscopic ventral hernia repair with mesh lysis of adhesions   HERNIA REPAIR     HOLMIUM LASER APPLICATION Right 07/14/2016   Procedure: HOLMIUM LASER APPLICATION;  Surgeon: Sherrilee Belvie CROME, MD;  Location: [facility];  Service: Urology;  Laterality: Right;   SPLENECTOMY  2004   TUBAL LIGATION     UPPER GASTROINTESTINAL ENDOSCOPY  12/08/2022   with colonoscopy at [facility] , Veena Nandigam, Hiatal Hernia, esophageal stricture, dilation,   VAGINAL HYSTERECTOMY N/A 01/10/2017   Procedure: HYSTERECTOMY VAGINAL W/ BILATERAL SALPINGECTOMY WITH REPAIR OF INCIDENTAL CYSTOTOMY;  Surgeon: Lorence Ozell CROME, MD;  Location: [facility];  Service: Gynecology;  Laterality: N/A;    OB History     Gravida  5   Para      Term      Preterm      AB      Living  5      SAB      IAB      Ectopic      Multiple      Live Births  5            Home Medications    Prior to Admission medications   Medication Sig Start Date End Date Taking? Authorizing Provider  cyclobenzaprine  (FLEXERIL ) 5 MG tablet Take 1 tablet (5 mg total) by mouth 3 (three) times daily as needed for muscle  spasms. Patient not taking: Reported on 12/06/2023 03/28/23   Tobie Gaines, DO  diclofenac  Sodium (VOLTAREN ) 1 % GEL Apply 4 g topically 4 (four) times daily. 11/30/23   Kandis Perkins, DO  divalproex  (DEPAKOTE ) 500 MG DR tablet Take 1 tablet (500 mg total) by mouth 3 (three) times daily. 12/05/23   Harl Zane BRAVO, NP  gabapentin (NEURONTIN) 300 MG capsule Take 1 capsule (300 mg total) by mouth at bedtime for 10 days. 12/12/23 12/22/23  Basilia Stuckert, FNP  hydrOXYzine  (ATARAX ) 25 MG tablet Take 1 tablet (25 mg total) by mouth 3 (three) times daily as needed. 12/05/23   Harl Zane BRAVO, NP  ondansetron  (ZOFRAN ) 4 MG tablet Take 1 tablet (4 mg total) by mouth every 8 (eight) hours as needed for nausea or vomiting. 07/05/23   Tobie Gaines, DO  pantoprazole  (PROTONIX ) 40 MG tablet Take 1 tablet (40 mg total) by mouth 2 (two) times daily. 12/13/22 12/13/23  Tawkaliyar, Roya, DO  pantoprazole  (PROTONIX ) 40 MG tablet Take 1 tablet (40 mg  total) by mouth daily. 08/22/23   Thapa, [place], MD  predniSONE  (DELTASONE ) 5 MG tablet 12 day taper: 6,6,5,5,4,4,3,3,2,2,1,1 12/06/23   Petery, John, DPM  prochlorperazine  (COMPAZINE ) 10 MG tablet Take 1 tablet (10 mg total) by mouth 2 (two) times daily as needed for nausea or vomiting. 07/30/23   Patsey Lot, MD  QUEtiapine  (SEROQUEL ) 25 MG tablet Take 3 tablets (75 mg total) by mouth at bedtime. 12/05/23   Harl Zane BRAVO, NP  QUEtiapine  (SEROQUEL ) 50 MG tablet Take 50 mg by mouth at bedtime. 07/31/23   [provider]  rosuvastatin  (CRESTOR ) 10 MG tablet Take 1 tablet (10 mg total) by mouth daily. 03/28/23 03/27/24  Tobie Gaines, DO  sucralfate  (CARAFATE ) 1 g tablet Take 1 tablet (1 g total) by mouth 4 (four) times daily. Take 10 mLs (1 g total) by mouth 4 (four) times daily - with meals and at bedtime. 12/13/22 12/06/23  Tawkaliyar, Roya, DO  fluticasone -salmeterol (ADVAIR HFA) 230-21 MCG/ACT inhaler Inhale 2 puffs into the lungs 2 (two) times daily. Rinse  mouth after each use Patient not taking: Reported on 02/14/2017 07/04/16 07/25/19  Nche, Roselie Rockford, NP    Family History Family History  Problem Relation Age of Onset   Alcohol abuse Mother    Alcohol abuse Father    Alcohol abuse Sister    Colon cancer Maternal Grandmother        colon   Diabetes Maternal Grandmother    Hypertension Maternal Grandmother    Thyroid  disease Neg Hx    Stomach cancer Neg Hx    Esophageal cancer Neg Hx    Rectal cancer Neg Hx     Social History Social History   Tobacco Use   Smoking status: Every Day    Current packs/day: 1.00    Average packs/day: 1 pack/day for 32.0 years (32.0 ttl pk-yrs)    Types: Cigarettes    Passive exposure: Current   Smokeless tobacco: Never  Vaping Use   Vaping status: Never Used  Substance Use Topics   Alcohol use: Not Currently    Comment: quit 12/2012   Drug use: Not Currently    Types: Crack cocaine, Marijuana, Cocaine    Comment: per pt quit 12-26-2012 marijuana 2 times a week     Allergies   Patient has no known allergies.   Review of Systems Review of Systems  Constitutional:  Negative for fever.  Gastrointestinal:  Negative for abdominal pain, anal bleeding, blood in stool, diarrhea, nausea and vomiting.  Musculoskeletal:  Positive for arthralgias. Negative for gait problem and joint swelling.  Neurological:  Positive for numbness. Negative for weakness.  All other systems reviewed and are negative.    Physical Exam Triage Vital Signs ED Triage Vitals  Encounter Vitals Group     BP 12/16/23 1356 119/72     Girls Systolic BP Percentile --      Girls Diastolic BP Percentile --      Boys Systolic BP Percentile --      Boys Diastolic BP Percentile --      Pulse Rate 12/16/23 1356 63     Resp 12/16/23 1356 18     Temp 12/16/23 1356 97.8 F (36.6 C)     Temp Source 12/16/23 1356 Oral     SpO2 12/16/23 1356 97 %     Weight 12/16/23 1355 126 lb 15.8 oz (57.6 kg)     Height --      Head  Circumference --  Peak Flow --      Pain Score 12/16/23 1354 10     Pain Loc --      Pain Education --      Exclude from Growth Chart --    No data found.  Updated Vital Signs BP 119/72 (BP Location: Right Arm)   Pulse 63   Temp 97.8 F (36.6 C) (Oral)   Resp 18   Wt 126 lb 15.8 oz (57.6 kg)   LMP  (LMP Unknown)   SpO2 97%   BMI 21.46 kg/m   Visual Acuity Right Eye Distance:   Left Eye Distance:   Bilateral Distance:    Right Eye Near:   Left Eye Near:    Bilateral Near:     Physical Exam Vitals reviewed.  Constitutional:      General: She is awake. She is not in acute distress.    Appearance: Normal appearance. She is well-developed. She is not ill-appearing, toxic-appearing or diaphoretic.  HENT:     Head: Normocephalic.     Right Ear: Hearing normal.     Left Ear: Hearing normal.     Nose: Nose normal.     Mouth/Throat:     Mouth: Mucous membranes are moist.  Eyes:     General: Vision grossly intact.     Conjunctiva/sclera: Conjunctivae normal.  Cardiovascular:     Rate and Rhythm: Normal rate and regular rhythm.     Pulses:          Dorsalis pedis pulses are 2+ on the right side and 2+ on the left side.     Heart sounds: Normal heart sounds.  Pulmonary:     Effort: Pulmonary effort is normal.     Breath sounds: Normal breath sounds and air entry.  Musculoskeletal:        General: Normal range of motion.     Cervical back: Normal range of motion and neck supple.     Right foot: Normal range of motion.     Left foot: Normal range of motion.  Feet:     Right foot:     Skin integrity: Skin integrity normal.     Left foot:     Skin integrity: Skin integrity normal.     Comments: Tenderness noted along the plantar fascial band from the toes to the heel bilaterally. Hallux valgus deformities bilaterally Skin:    General: Skin is warm and dry.  Neurological:     General: No focal deficit present.     Mental Status: She is alert and oriented to  person, place, and time.  Psychiatric:        Speech: Speech normal.        Behavior: Behavior is cooperative.      UC Treatments / Results  Labs (all labs ordered are listed, but only abnormal results are displayed) Labs Reviewed - No data to display  EKG   Radiology No results found.  Procedures Procedures (including critical care time)  Medications Ordered in UC Medications  alum & mag hydroxide-simeth (MAALOX/MYLANTA) 200-200-20 MG/5ML suspension 30 mL (30 mLs Oral Given 12/16/23 1504)  lidocaine  (XYLOCAINE ) 2 % viscous mouth solution 15 mL (15 mLs Mouth/Throat Given 12/16/23 1504)    Initial Impression / Assessment and Plan / UC Course  I have reviewed the triage vital signs and the nursing notes.  Pertinent labs & imaging results that were available during my care of the patient were reviewed by me and considered in my medical decision making (  see chart for details).     The patient presents with ongoing bilateral foot pain previously diagnosed as plantar fasciitis. She was evaluated by podiatry on 9/25 and prescribed a 12-day prednisone  taper, which she is currently completing. Despite this, she continues to experience burning pain on the plantar surfaces and between her toes, significantly impairing ambulation and impacting her ability to work. She was seen here on 10/1 and started on gabapentin 300 mg nightly in addition to supportive therapy with a post-op shoe, which she reports has provided partial relief. She admits inconsistent use of gabapentin and continues to have burning foot pain but denies worsening symptoms. She has a follow-up podiatry appointment scheduled for 10/10 and is requesting a work note at this visit.  She also reports worsening GERD symptoms despite compliance with Protonix . She denies vomiting, hematemesis, or melena. Supportive measures were discussed.  GI cocktail given in clinic.  Today's evaluation has revealed no signs of a dangerous  process. Discussed diagnosis with patient and/or guardian. Patient and/or guardian aware of their diagnosis, possible red flag symptoms to watch out for and need for close follow up. Patient and/or guardian understands verbal and written discharge instructions. Patient and/or guardian comfortable with plan and disposition.  Patient and/or guardian has a clear mental status at this time, good insight into illness (after discussion and teaching) and has clear judgment to make decisions regarding their care  Documentation was completed with the aid of voice recognition software. Transcription may contain typographical errors.  Final Clinical Impressions(s) / UC Diagnoses   Final diagnoses:  Bilateral plantar fasciitis  Neuropathy of both feet  Gastroesophageal reflux disease without esophagitis     Discharge Instructions      You were seen today for continued foot pain. You have been diagnosed with plantar fasciitis in the past and are currently finishing a prednisone  taper. Despite this, you are still experiencing burning pain on the bottoms of your feet and between your toes. You were started on gabapentin at your last visit, and it is important that you take this medication every night as prescribed, since it works best when taken consistently. The post-op shoe you were given is helping provide support, and you should continue wearing it as much as possible. You have a follow-up appointment with podiatry on October 10, where your feet can be reassessed and additional treatment options can be discussed. A work note has been provided today to support your job needs.  You also reported increased heartburn and reflux symptoms, even while taking Protonix . You were given a GI cocktail today in the clinic. Continue taking Protonix  as prescribed. Avoid caffeine , alcohol, fried or spicy foods, and do not lie down immediately after eating, as these can worsen reflux. Elevating the head of your bed at night  may also help.  Please return for medical care sooner if your foot pain suddenly worsens, if you develop new redness, swelling, fever, or drainage from your feet, or if your reflux symptoms are associated with vomiting, blood in vomit, black or bloody stools, chest pain, or difficulty swallowing. Otherwise, keep your podiatry appointment on October 10 and follow up with your primary care provider for ongoing reflux management.     ED Prescriptions   None    PDMP not reviewed this encounter.   Iola Lukes, [place] 12/16/23 1510

## 2023-12-16 NOTE — ED Triage Notes (Signed)
 Pt presents c/o bilateral foot pain x a while. Pt reports she was just seen for the same issue on 12/12/23. Pt says she sees podiatry on 12/21/23. Pt states previous treatment was non effective. Pt reports her feet are still burning on the bottom and on top of her toes as well.

## 2023-12-19 ENCOUNTER — Ambulatory Visit (HOSPITAL_COMMUNITY)
Admission: EM | Admit: 2023-12-19 | Discharge: 2023-12-19 | Disposition: A | Discharge status: Home / Self Care | Payer: MEDICAID | Attending: Emergency Medicine | Admitting: Emergency Medicine

## 2023-12-19 ENCOUNTER — Encounter (HOSPITAL_COMMUNITY): Payer: Self-pay

## 2023-12-19 DIAGNOSIS — K219 Gastro-esophageal reflux disease without esophagitis: Secondary | ICD-10-CM

## 2023-12-19 DIAGNOSIS — M722 Plantar fascial fibromatosis: Secondary | ICD-10-CM

## 2023-12-19 MED ORDER — ALUMINUM-MAGNESIUM-SIMETHICONE 200-200-20 MG/5ML PO SUSP: 30.0000 mL | Freq: Three times a day (TID) | ORAL | 0 refills | Status: AC

## 2023-12-19 MED ORDER — ALUM & MAG HYDROXIDE-SIMETH 200-200-20 MG/5ML PO SUSP
ORAL | Status: AC
  Filled 2023-12-19: qty 30

## 2023-12-19 MED ORDER — LIDOCAINE VISCOUS HCL 2 % MT SOLN
OROMUCOSAL | Status: AC
  Filled 2023-12-19: qty 15

## 2023-12-19 MED ORDER — LIDOCAINE VISCOUS HCL 2 % MT SOLN
15.0000 mL | Freq: Once | OROMUCOSAL | Status: AC
  Administered 2023-12-19: 15 mL via OROMUCOSAL

## 2023-12-19 MED ORDER — ALUM & MAG HYDROXIDE-SIMETH 200-200-20 MG/5ML PO SUSP
30.0000 mL | Freq: Once | ORAL | Status: AC
  Administered 2023-12-19: 30 mL via ORAL

## 2023-12-19 MED ORDER — LIDOCAINE VISCOUS HCL 2 % MT SOLN: 15.0000 mL | OROMUCOSAL | 0 refills | Status: AC | PRN

## 2023-12-19 NOTE — ED Provider Notes (Signed)
 [facility]    CSN: [id number] Arrival date & time: 12/19/23  1408      History   Chief Complaint Chief Complaint  Patient presents with   Foot Pain   Abdominal Pain    HPI Marlin Brys is a 58 y.o. female.   Patient presents to clinic over concerns of ongoing bilateral foot pain.  Has been diagnosed with bilateral plantar fasciitis and does have a orthopedic follow-up appointment tomorrow.  She wears steel toed boots for work and this seems to exacerbate her pain.  Is wearing bilateral postop shoes today, these worked temporarily but now they seem to make her pain worse.  Has tried multiple over-the-counter methods and treatments for the plantar fasciitis.  Gabapentin has not helped.  Has not had trauma, injuries or skin changes to the feet.  Also endorses abdominal pain and bloating, much improved with GI cocktail.  She did buy Maalox last night.  Noticed this did not have lidocaine  in it.  Did have an episode of emesis this morning that was mostly spit.  Is not currently nauseous.    The history is provided by the patient and medical records.  Foot Pain  Abdominal Pain   Past Medical History:  Diagnosis Date   Bipolar depression (HCC)    Borderline personality disorder (HCC)    COPD (chronic obstructive pulmonary disease) (HCC)    Depression    GERD (gastroesophageal reflux disease)    Hiatal hernia    History of alcohol abuse    per pt none since 10/ 2014   History of attempted suicide    History of drug abuse in remission (HCC)    per pt in remission since 12-26-2012  polysubstance dependence (alcohol, crack, cocaine, 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   History of hepatitis C per pt first dx 2009 (approx)   consulted w/ dr comer (infectious disease) note 04-26-2016 , pt states was called and told per lab work no longer has hepatitis c    History of kidney stones    Hypercalcemia    mild and long-standing--- pt asymptomatic   Hyperlipidemia     Nausea and vomiting 07/04/2011   Primary hyperparathyroidism    endocrinology --  dr von   Pruritic erythematous rash    lower legs   PTSD (post-traumatic stress disorder)    Right ureteral stone    Schizophrenia (HCC)    Substance abuse (HCC)    Vitamin D  deficiency     Patient Active Problem List   Diagnosis Date Noted   Osteoporosis 12/07/2023   Flat feet, bilateral 11/30/2023   Chronic low back pain 03/28/2023   Immunization due 03/28/2023   Constipation 03/28/2023   Primary hyperparathyroidism 12/13/2022   Hyperlipidemia 12/13/2022   Tobacco dependence 12/06/2020   Chronic hepatitis C without hepatic coma (HCC) 05/07/2020   Drug abuse and dependence (HCC) 05/07/2020   Bipolar 1 disorder, depressed (HCC) 11/11/2019   Post-traumatic stress disorder, unspecified 07/24/2018   Cannabis dependence, uncomplicated (HCC) 07/24/2018   Bipolar disord, crnt epsd depress, severe, w psych features (HCC) 07/24/2018   Nicotine  dependence, unspecified, uncomplicated 07/24/2018   Alcohol dependence, uncomplicated (HCC) 07/24/2018   Cocaine dependence in remission (HCC) 07/24/2018   Personality disorder (HCC) 11/16/2017   Reactive airway disease without asthma 04/12/2016   Nausea and vomiting 07/04/2011    Past Surgical History:  Procedure Laterality Date   COLONOSCOPY  12/08/2022   at [facility] with Veena Nandigam at same time as egd  CYSTOSCOPY N/A 01/10/2017   Procedure: CYSTOSCOPY;  Surgeon: Lorence Ozell CROME, MD;  Location: [facility];  Service: Gynecology;  Laterality: N/A;   CYSTOSCOPY W/ URETERAL STENT PLACEMENT Right 07/14/2016   Procedure: CYSTOSCOPY WITH STENT REPLACEMENT;  Surgeon: Sherrilee Belvie CROME, MD;  Location: [facility];  Service: Urology;  Laterality: Right;   CYSTOSCOPY WITH RETROGRADE PYELOGRAM, URETEROSCOPY AND STENT PLACEMENT Right 06/28/2016   Procedure: CYSTOSCOPY WITH RETROGRADE PYELOGRAM, URETEROSCOPY AND STENT PLACEMENT;  Surgeon: Belvie CROME Sherrilee, MD;  Location: [facility];  Service: Urology;  Laterality: Right;   CYSTOSCOPY/RETROGRADE/URETEROSCOPY/STONE EXTRACTION WITH BASKET Right 07/14/2016   Procedure: CYSTOSCOPY/RETROGRADE/URETEROSCOPY/STONE EXTRACTION WITH BASKET;  Surgeon: Sherrilee Belvie CROME, MD;  Location: [facility];  Service: Urology;  Laterality: Right;   DIAGNOSTIC LAPAROSCOPIC LIVER BIOPSY N/A 02/10/2013   Procedure: DIAGNOSTIC LAPAROSCOPIC ;  Surgeon: Elspeth KYM Schultze, MD;  Location: [facility];  Service: General;  Laterality: N/A;  DIAGNOSTIC LAPAROSCOPY,laparoscopic ventral hernia repair with mesh lysis of adhesions   HERNIA REPAIR     HOLMIUM LASER APPLICATION Right 07/14/2016   Procedure: HOLMIUM LASER APPLICATION;  Surgeon: Sherrilee Belvie CROME, MD;  Location: [facility];  Service: Urology;  Laterality: Right;   SPLENECTOMY  2004   TUBAL LIGATION     UPPER GASTROINTESTINAL ENDOSCOPY  12/08/2022   with colonoscopy at [facility] , Veena Nandigam, Hiatal Hernia, esophageal stricture, dilation,   VAGINAL HYSTERECTOMY N/A 01/10/2017   Procedure: HYSTERECTOMY VAGINAL W/ BILATERAL SALPINGECTOMY WITH REPAIR OF INCIDENTAL CYSTOTOMY;  Surgeon: Lorence Ozell CROME, MD;  Location: [facility];  Service: Gynecology;  Laterality: N/A;    OB History     Gravida  5   Para      Term      Preterm      AB      Living  5      SAB      IAB      Ectopic      Multiple      Live Births  5            Home Medications    Prior to Admission medications   Medication Sig Start Date End Date Taking? Authorizing Provider  aluminum-magnesium  hydroxide-simethicone  (MAALOX) 200-200-20 MG/5ML SUSP Take 30 mLs by mouth 4 (four) times daily -  before meals and at bedtime. 12/19/23  Yes Librado Guandique  N, FNP  lidocaine  (XYLOCAINE ) 2 % solution Use as directed 15 mLs in the mouth or throat as needed for mouth pain. 12/19/23  Yes Lucion Dilger  N, FNP  diclofenac  Sodium (VOLTAREN ) 1 % GEL Apply 4 g  topically 4 (four) times daily. 11/30/23   Kandis Perkins, DO  divalproex  (DEPAKOTE ) 500 MG DR tablet Take 1 tablet (500 mg total) by mouth 3 (three) times daily. 12/05/23   Harl Zane BRAVO, NP  gabapentin (NEURONTIN) 300 MG capsule Take 1 capsule (300 mg total) by mouth at bedtime for 10 days. 12/12/23 12/22/23  Murrill, Samantha, FNP  hydrOXYzine  (ATARAX ) 25 MG tablet Take 1 tablet (25 mg total) by mouth 3 (three) times daily as needed. 12/05/23   Harl Zane BRAVO, NP  ondansetron  (ZOFRAN ) 4 MG tablet Take 1 tablet (4 mg total) by mouth every 8 (eight) hours as needed for nausea or vomiting. 07/05/23   Tobie Gaines, DO  pantoprazole  (PROTONIX ) 40 MG tablet Take 1 tablet (40 mg total) by mouth 2 (two) times daily. 12/13/22 12/13/23  Tawkaliyar, Roya, DO  pantoprazole  (PROTONIX ) 40 MG tablet Take 1  tablet (40 mg total) by mouth daily. 08/22/23   Thapa, [place], MD  predniSONE  (DELTASONE ) 5 MG tablet 12 day taper: 6,6,5,5,4,4,3,3,2,2,1,1 12/06/23   Petery, John, DPM  QUEtiapine  (SEROQUEL ) 25 MG tablet Take 3 tablets (75 mg total) by mouth at bedtime. 12/05/23   Harl Zane BRAVO, NP  QUEtiapine  (SEROQUEL ) 50 MG tablet Take 50 mg by mouth at bedtime. 07/31/23   [provider]  rosuvastatin  (CRESTOR ) 10 MG tablet Take 1 tablet (10 mg total) by mouth daily. Patient not taking: Reported on 12/19/2023 03/28/23 03/27/24  Tobie Gaines, DO  sucralfate  (CARAFATE ) 1 g tablet Take 1 tablet (1 g total) by mouth 4 (four) times daily. Take 10 mLs (1 g total) by mouth 4 (four) times daily - with meals and at bedtime. Patient not taking: Reported on 12/19/2023 12/13/22 12/06/23  Tawkaliyar, Roya, DO  fluticasone -salmeterol (ADVAIR HFA) 230-21 MCG/ACT inhaler Inhale 2 puffs into the lungs 2 (two) times daily. Rinse mouth after each use Patient not taking: Reported on 02/14/2017 07/04/16 07/25/19  Nche, Roselie Rockford, NP    Family History Family History  Problem Relation Age of Onset   Alcohol abuse Mother    Alcohol  abuse Father    Alcohol abuse Sister    Colon cancer Maternal Grandmother        colon   Diabetes Maternal Grandmother    Hypertension Maternal Grandmother    Thyroid  disease Neg Hx    Stomach cancer Neg Hx    Esophageal cancer Neg Hx    Rectal cancer Neg Hx     Social History Social History   Tobacco Use   Smoking status: Every Day    Current packs/day: 1.00    Average packs/day: 1 pack/day for 32.0 years (32.0 ttl pk-yrs)    Types: Cigarettes    Passive exposure: Current   Smokeless tobacco: Never  Vaping Use   Vaping status: Never Used  Substance Use Topics   Alcohol use: Not Currently    Comment: quit 12/2012   Drug use: Not Currently    Types: Crack cocaine, Marijuana, Cocaine    Comment: per pt quit 12-26-2012 marijuana 2 times a week     Allergies   Patient has no known allergies.   Review of Systems Review of Systems  Per HPI  Physical Exam Triage Vital Signs ED Triage Vitals  Encounter Vitals Group     BP 12/19/23 1639 115/69     Girls Systolic BP Percentile --      Girls Diastolic BP Percentile --      Boys Systolic BP Percentile --      Boys Diastolic BP Percentile --      Pulse Rate 12/19/23 1639 76     Resp 12/19/23 1639 16     Temp 12/19/23 1639 98.1 F (36.7 C)     Temp Source 12/19/23 1639 Oral     SpO2 12/19/23 1639 98 %     Weight --      Height --      Head Circumference --      Peak Flow --      Pain Score 12/19/23 1642 8     Pain Loc --      Pain Education --      Exclude from Growth Chart --    No data found.  Updated Vital Signs BP 115/69 (BP Location: Left Arm)   Pulse 76   Temp 98.1 F (36.7 C) (Oral)   Resp 16   LMP  (  LMP Unknown)   SpO2 98%   Visual Acuity Right Eye Distance:   Left Eye Distance:   Bilateral Distance:    Right Eye Near:   Left Eye Near:    Bilateral Near:     Physical Exam Vitals and nursing note reviewed.  Constitutional:      Appearance: Normal appearance. She is well-developed.   HENT:     Head: Normocephalic and atraumatic.     Right Ear: External ear normal.     Left Ear: External ear normal.     Nose: Nose normal.     Mouth/Throat:     Mouth: Mucous membranes are moist.  Eyes:     General: No scleral icterus.    Conjunctiva/sclera: Conjunctivae normal.  Cardiovascular:     Rate and Rhythm: Normal rate.  Pulmonary:     Effort: Pulmonary effort is normal. No respiratory distress.  Abdominal:     General: Abdomen is flat. Bowel sounds are normal.     Palpations: Abdomen is soft.     Tenderness: There is no abdominal tenderness. There is no guarding or rebound.  Skin:    General: Skin is warm and dry.     Capillary Refill: Capillary refill takes less than 2 seconds.  Neurological:     General: No focal deficit present.     Mental Status: She is alert and oriented to person, place, and time.  Psychiatric:        Mood and Affect: Mood normal.        Behavior: Behavior normal.      UC Treatments / Results  Labs (all labs ordered are listed, but only abnormal results are displayed) Labs Reviewed - No data to display  EKG   Radiology No results found.  Procedures Procedures (including critical care time)  Medications Ordered in UC Medications  alum & mag hydroxide-simeth (MAALOX/MYLANTA) 200-200-20 MG/5ML suspension 30 mL (30 mLs Oral Given 12/19/23 1655)  lidocaine  (XYLOCAINE ) 2 % viscous mouth solution 15 mL (15 mLs Mouth/Throat Given 12/19/23 1655)    Initial Impression / Assessment and Plan / UC Course  I have reviewed the triage vital signs and the nursing notes.  Pertinent labs & imaging results that were available during my care of the patient were reviewed by me and considered in my medical decision making (see chart for details).  Vitals and triage reviewed, patient is hemodynamically stable.  Foot pain reproducible with palpation, consistent with plantar fasciitis.  Advise rolling out on frozen water  bottles and following up with  orthopedics as scheduled, can also consider podiatry.  Abdomen is soft and nontender with active bowel sounds, low concern for acute abdomen.  Symptoms lactate improved after GI cocktail.  Will send in Maalox and lidocaine .  PCP follow-up if symptoms persist.  Has been taking her Protonix .  Plan of care, follow-up care return precautions given, no questions at this time.  Work note provided.    Final Clinical Impressions(s) / UC Diagnoses   Final diagnoses:  Bilateral plantar fasciitis  Gastroesophageal reflux disease without esophagitis     Discharge Instructions      Follow-up with orthopedics as scheduled for your evaluation of bilateral plantar fasciitis tomorrow.  I suggest freezing a water  bottle and rolling the arch of your feet over it to help with pain.  They may suggest discontinuing the bilateral postop shoes.  I have sent in Maalox and lidocaine .  If you already have the Maalox at home you do not have to pick  this up.  The lidocaine  you can swallow and this will help numb your stomach, this will numb your throat and increases your risk of aspiration.  Be careful when eating and drinking.  If your acid reflux symptoms persist please follow-up with your primary care provider for further evaluation.  Return to clinic for new or urgent symptoms.     ED Prescriptions     Medication Sig Dispense Auth. Provider   aluminum-magnesium  hydroxide-simethicone  (MAALOX) 200-200-20 MG/5ML SUSP Take 30 mLs by mouth 4 (four) times daily -  before meals and at bedtime. 355 mL Kennith Morss  N, FNP   lidocaine  (XYLOCAINE ) 2 % solution Use as directed 15 mLs in the mouth or throat as needed for mouth pain. 100 mL Dreama, Iona Stay  N, FNP      PDMP not reviewed this encounter.   Dreama, Esli Clements  N, FNP 12/19/23 1721

## 2023-12-19 NOTE — Discharge Instructions (Addendum)
 Follow-up with orthopedics as scheduled for your evaluation of bilateral plantar fasciitis tomorrow.  I suggest freezing a water  bottle and rolling the arch of your feet over it to help with pain.  They may suggest discontinuing the bilateral postop shoes.  I have sent in Maalox and lidocaine .  If you already have the Maalox at home you do not have to pick this up.  The lidocaine  you can swallow and this will help numb your stomach, this will numb your throat and increases your risk of aspiration.  Be careful when eating and drinking.  If your acid reflux symptoms persist please follow-up with your primary care provider for further evaluation.  Return to clinic for new or urgent symptoms.

## 2023-12-19 NOTE — ED Triage Notes (Signed)
 Patient here today with c/o bilat foot pain X 1 month. Patient is wearing bilat post op shoes. No known injury. Patient has an appointment with an orthopedic tomorrow.   Patient also c/o abd pain and nausea X 2 days. Patient went to another urgent care when it started and was given a GI cocktail with little relief. Patient vomited this morning.

## 2023-12-21 ENCOUNTER — Encounter: Payer: Self-pay | Admitting: Podiatry

## 2023-12-21 ENCOUNTER — Ambulatory Visit (INDEPENDENT_AMBULATORY_CARE_PROVIDER_SITE_OTHER): Payer: MEDICAID | Admitting: Podiatry

## 2023-12-21 VITALS — Ht 64.5 in | Wt 127.0 lb

## 2023-12-21 DIAGNOSIS — M722 Plantar fascial fibromatosis: Secondary | ICD-10-CM | POA: Diagnosis not present

## 2023-12-21 MED ORDER — TRIAMCINOLONE ACETONIDE 10 MG/ML IJ SUSP
10.0000 mg | Freq: Once | INTRAMUSCULAR | Status: AC
  Administered 2023-12-21: 10 mg

## 2023-12-21 NOTE — Progress Notes (Signed)
/  Still complaining of pain along the arches.  Says the prednisone  may have blunted the pain a little bit but is still fairly significant with burning along the arches.  Worse with standing and walking.   Physical exam:  General appearance: Pleasant, and in no acute distress. AOx3.  Vascular: Pedal pulses: DP 2/4 bilaterally, PT 2/4 bilaterally.  Mild edema lower legs bilaterally. Capillary fill time immediate bilaterally.  Neurological: Negative Tinel's sign tarsal tunnel and porta pedis bilaterally  Dermatologic:   Skin normal temperature bilaterally.  Skin normal color, tone, and texture bilaterally.   Musculoskeletal: Tenderness over the middle one third of the plantar fascia band bilaterally.  No fibromas noted.  No tenderness of the heel today.   Diagnosis: 1 plantar fasciitis bilaterally  Plan: -Will try injection along the mid band of the plantar fascia bilaterally.  Wear good stable supportive shoes.  -injected 3cc 2:1 mixture 0.5 cc Marcaine :Kenolog 10mg /54ml at middle one third of plantar fascial band bilaterally..     Return 2 weeks follow-up injection plantar fascia bilaterally

## 2023-12-25 ENCOUNTER — Ambulatory Visit: Payer: MEDICAID | Admitting: Podiatry

## 2023-12-25 ENCOUNTER — Encounter: Payer: Self-pay | Admitting: Podiatry

## 2023-12-25 ENCOUNTER — Ambulatory Visit (INDEPENDENT_AMBULATORY_CARE_PROVIDER_SITE_OTHER): Payer: MEDICAID | Admitting: Podiatry

## 2023-12-25 DIAGNOSIS — M722 Plantar fascial fibromatosis: Secondary | ICD-10-CM | POA: Diagnosis not present

## 2023-12-25 NOTE — Progress Notes (Signed)
 Patient presents with complaint of pain on the plantar aspect of the feet.  Had an injection 5 days ago.  There is maybe a little bit better was concerned about some numbness in her feet was also wondering if she can get a note for light duty and fever hours.   Physical exam:  General appearance: Pleasant, and in no acute distress. AOx3.  Vascular: Pedal pulses: DP 2/4 bilaterally, PT 2/4 bilaterally.  Minimal edema lower legs bilaterally. Capillary fill time immediate bilaterally.  Neurological: Negative Tinel sign tarsal tunnel and porta pedis bilaterally  Dermatologic:   Skin normal temperature bilaterally.  Skin normal color, tone, and texture bilaterally.   Musculoskeletal: Tenderness in the middle one third of the plantar fascial band bilaterally.  No fibromas noted.    Diagnosis: 1 plantar fasciitis bilaterally  Plan: -Established office visit for evaluation and management level 3 -Discussed with the numbness in the feet is are not unusual with these injections as the nerves are near the plantar fascia and is unusual to have a day or 2 and numbness in the toes.  I taken 2 full weeks to accurately assess how well the corticosteroid injections have worked. -We can get her note for 8 hours maximum work per day2 And light duty if possible.  Keep l appointment in 1-1/2 weeks for follow-up

## 2023-12-26 ENCOUNTER — Encounter: Payer: Self-pay | Admitting: Podiatry

## 2023-12-29 ENCOUNTER — Other Ambulatory Visit: Payer: Self-pay | Admitting: Endocrinology

## 2023-12-29 DIAGNOSIS — K219 Gastro-esophageal reflux disease without esophagitis: Secondary | ICD-10-CM

## 2024-01-04 ENCOUNTER — Ambulatory Visit: Payer: MEDICAID | Admitting: Podiatry

## 2024-01-04 ENCOUNTER — Encounter: Payer: Self-pay | Admitting: Podiatry

## 2024-01-04 VITALS — Ht 64.5 in | Wt 127.0 lb

## 2024-01-04 DIAGNOSIS — M722 Plantar fascial fibromatosis: Secondary | ICD-10-CM

## 2024-01-04 MED ORDER — MELOXICAM 7.5 MG PO TABS: ORAL_TABLET | ORAL | 1 refills | Status: AC

## 2024-01-04 MED ORDER — TRIAMCINOLONE ACETONIDE 10 MG/ML IJ SUSP
10.0000 mg | Freq: Once | INTRAMUSCULAR | Status: AC
  Administered 2024-01-04: 10 mg

## 2024-01-04 NOTE — Progress Notes (Signed)
 Patient presents for follow-up plantar fasciitis.  Right foot foot is doing a lot better to some soreness at this point.  Left 1 is the worst right now along the mid arch.   Physical exam:  General appearance: Pleasant, and in no acute distress. AOx3.  Vascular: Pedal pulses: DP 2/4 bilaterally, PT 2/4 bilaterally. Minimal edema lower legs bilaterally. Capillary fill time immediate b/l.  Neurological: Negative Tinel sign tarsal tunnel and porta pedis bilaterally  Dermatologic:   Skin normal temperature bilaterally.  Skin normal color, tone, and texture bilaterally.   Musculoskeletal: Tenderness along the middle one third of the plantar fascia left greater than right.  No fibromas noted.   Diagnosis: 1.  Plantar fasciitis bilaterally  Plan: -Continue good supportive shoes continue icing and stretching.  Has been taking some over-the-counter Aleve  but this does not seem to help.  Will switch her to and give her prescription for meloxicam. -injected 3cc 2:1 mixture 0.5 cc Marcaine :Kenolog 10mg /5ml at middle one third plantar fascia left.      Return 2 weeks follow-up injection plantar fascia left

## 2024-01-04 NOTE — Addendum Note (Signed)
 Addended by: CHRISTINE NORLEEN LABOR on: 01/04/2024 10:24 AM   Modules accepted: Orders

## 2024-01-05 ENCOUNTER — Other Ambulatory Visit: Payer: Self-pay | Admitting: Gastroenterology

## 2024-01-12 ENCOUNTER — Other Ambulatory Visit (HOSPITAL_BASED_OUTPATIENT_CLINIC_OR_DEPARTMENT_OTHER): Payer: Self-pay

## 2024-01-17 ENCOUNTER — Ambulatory Visit: Payer: MEDICAID

## 2024-01-17 VITALS — BP 109/86 | HR 64 | Temp 97.5°F | Resp 18 | Ht 64.0 in | Wt 130.4 lb

## 2024-01-17 DIAGNOSIS — M818 Other osteoporosis without current pathological fracture: Secondary | ICD-10-CM

## 2024-01-17 MED ORDER — DIPHENHYDRAMINE HCL 25 MG PO CAPS
25.0000 mg | ORAL_CAPSULE | Freq: Once | ORAL | Status: AC
  Administered 2024-01-17: 25 mg via ORAL
  Filled 2024-01-17: qty 1

## 2024-01-17 MED ORDER — SODIUM CHLORIDE 0.9 % IV SOLN: INTRAVENOUS | Status: DC

## 2024-01-17 MED ORDER — ZOLEDRONIC ACID 5 MG/100ML IV SOLN
5.0000 mg | Freq: Once | INTRAVENOUS | Status: AC
  Administered 2024-01-17: 5 mg via INTRAVENOUS
  Filled 2024-01-17: qty 100

## 2024-01-17 MED ORDER — ACETAMINOPHEN 325 MG PO TABS
650.0000 mg | ORAL_TABLET | Freq: Once | ORAL | Status: AC
  Administered 2024-01-17: 650 mg via ORAL
  Filled 2024-01-17: qty 2

## 2024-01-17 NOTE — Patient Instructions (Signed)

## 2024-01-17 NOTE — Progress Notes (Signed)
 Diagnosis: Iron Deficiency Anemia  Provider:  Mannam, Praveen MD  Procedure: IV Infusion  IV Type: Peripheral, IV Location: R Antecubital  Reclast (Zolendronic Acid), Dose: 5 mg  Infusion Start Time: 1547  Infusion Stop Time: 1617  Post Infusion IV Care: Observation period completed and Peripheral IV Discontinued  Discharge: Condition: Good, Destination: Home . AVS Provided  Performed by:  Trudy Lamarr LABOR, RN

## 2024-01-18 ENCOUNTER — Ambulatory Visit: Payer: MEDICAID | Admitting: Podiatry

## 2024-01-18 ENCOUNTER — Encounter: Payer: Self-pay | Admitting: Podiatry

## 2024-01-18 DIAGNOSIS — M722 Plantar fascial fibromatosis: Secondary | ICD-10-CM

## 2024-01-18 MED ORDER — NAPROXEN 500 MG PO TABS: 500.0000 mg | ORAL_TABLET | Freq: Two times a day (BID) | ORAL | 2 refills | Status: AC

## 2024-01-18 NOTE — Progress Notes (Signed)
 Present still complaining of pain on the plantar aspect of the foot bilaterally.  Says she had a little bit improvement with the 2 injections that she had previously but that only lasted for maybe a week.  Has also tried prednisone .  She works on her feet many hours a day.     Physical exam:  General appearance: Pleasant, and in no acute distress. AOx3.  Vascular: Pedal pulses: DP 22/4 bilaterally, PT 2/4 bilaterally.  Minimal edema lower legs bilaterally. Capillary fill time immediate bilaterally.  Neurological: Grossly intact.  Negative Tinel's sign tarsal tunnel and porta pedis bilaterally  Dermatologic:   Skin normal temperature bilaterally.  Skin normal color, tone, and texture bilaterally.   Musculoskeletal: Plantar fascia bilaterally.  Fibromas noted.  No tenderness to the calcaneus.   Diagnosis: 1.  Plantar fasciitis bilaterally.  Plan: -Established office visit for evaluation management level 3. -Discussed with her continued pain with plantar fasciitis.  Would recommend getting her set up with physical therapy.  I think to be greatly beneficial for her.  Also get a prescription for naproxen .  Discontinue any other NSAIDs while taking. -Rx naproxen  500 mg, 1 p.o. twice daily, refill x 2 -Refer to physical therapy: 16 visits evaluate and treat.  Diagnosis plantar fasciitis bilaterally  Return 6 weeks follow-up

## 2024-01-21 ENCOUNTER — Encounter: Payer: Self-pay | Admitting: Podiatry

## 2024-01-29 ENCOUNTER — Other Ambulatory Visit: Payer: Self-pay | Admitting: Endocrinology

## 2024-01-29 DIAGNOSIS — K219 Gastro-esophageal reflux disease without esophagitis: Secondary | ICD-10-CM

## 2024-01-31 ENCOUNTER — Encounter (HOSPITAL_COMMUNITY): Payer: MEDICAID | Admitting: Psychiatry

## 2024-02-29 ENCOUNTER — Ambulatory Visit: Payer: MEDICAID | Admitting: Podiatry

## 2024-03-24 ENCOUNTER — Other Ambulatory Visit: Payer: Self-pay | Admitting: Endocrinology

## 2024-03-24 DIAGNOSIS — K219 Gastro-esophageal reflux disease without esophagitis: Secondary | ICD-10-CM

## 2024-03-31 ENCOUNTER — Encounter: Payer: Self-pay | Admitting: Student

## 2024-03-31 ENCOUNTER — Ambulatory Visit: Payer: MEDICAID | Admitting: Student

## 2024-03-31 ENCOUNTER — Other Ambulatory Visit: Payer: Self-pay

## 2024-03-31 VITALS — BP 132/66 | HR 61 | Temp 97.5°F | Ht 64.0 in | Wt 134.8 lb

## 2024-03-31 DIAGNOSIS — E210 Primary hyperparathyroidism: Secondary | ICD-10-CM

## 2024-03-31 DIAGNOSIS — R112 Nausea with vomiting, unspecified: Secondary | ICD-10-CM

## 2024-03-31 DIAGNOSIS — M654 Radial styloid tenosynovitis [de Quervain]: Secondary | ICD-10-CM | POA: Diagnosis not present

## 2024-03-31 DIAGNOSIS — F17200 Nicotine dependence, unspecified, uncomplicated: Secondary | ICD-10-CM

## 2024-03-31 MED ORDER — ONDANSETRON HCL 4 MG PO TABS: 4.0000 mg | ORAL_TABLET | Freq: Three times a day (TID) | ORAL | 0 refills | Status: AC | PRN

## 2024-03-31 MED ORDER — SUCRALFATE 1 GM/10ML PO SUSP: 1.0000 g | Freq: Three times a day (TID) | ORAL | 0 refills | Status: AC

## 2024-03-31 MED ORDER — VARENICLINE TARTRATE 1 MG PO TABS: 1.0000 mg | ORAL_TABLET | Freq: Two times a day (BID) | ORAL | 0 refills | Status: AC

## 2024-03-31 MED ORDER — VARENICLINE TARTRATE 0.5 MG PO TABS: ORAL_TABLET | ORAL | 0 refills | Status: AC

## 2024-03-31 NOTE — Progress Notes (Signed)
 Internal Medicine Clinic Attending  Case discussed with the resident at the time of the visit.  We reviewed the resident's history and exam and pertinent patient test results.  I agree with the assessment, diagnosis, and plan of care documented in the resident's note.

## 2024-03-31 NOTE — Assessment & Plan Note (Signed)
 Patient has a history of nausea and intermittent vomiting.  She has not had any vomiting recently.  She also endorses that she has been having some nausea.  She is unable to pinpoint if any foods exacerbate this.  She has had scoping in the past with endoscopy and colonoscopy which was found to have mild stenosis.  Patient had dilatation of her esophagus.  Patient has no trouble swallowing at this time.  Her concern is more with nausea.  She does smoke marijuana.  I do wonder if this was related.  I encouraged her to quit smoking marijuana to see if this will help.  I encouraged her to reach back out to her GI doctor.  She agreed.  Plan: - Likely related to marijuana, encouraged her to stop using, she agreed - Patient encouraged to reach back out to her GI doctor - Carafate  refilled - Refill Zofran 

## 2024-03-31 NOTE — Progress Notes (Signed)
 "  CC: Left  thumb pain  HPI:  Mary [name] is a 59 y.o. female with past medical history of reactive airway disease, chronic hep C, primary hyperparathyroidism, bipolar disorder, alcohol use disorder who presents for left thumb pain.  Please see assessment and plan for full HPI.  Medication: Bipolar disorder: Seroquel  75 mg daily, Depakote  500 mg 3 times daily Anxiety: Atarax  25 mg TID GERD: Pantoprazole  40 mg daily, maalox 30 mL TID, sucralfate  1 g 4 times daily  Neuropathy: Gabapentin  300 mg nightly  Pain: Voltaren  Gel, naproxen  500 mg BIF, meloxicam  7.5 mg, 1-2 tablets daily, Nausea: Zofran  4 mg q8h PRN HLD: Crestor  10 mg daily  Osteoporosis: Reclast   Past Medical History:  Diagnosis Date   Bipolar depression (HCC)    Borderline personality disorder (HCC)    COPD (chronic obstructive pulmonary disease) (HCC)    Depression    GERD (gastroesophageal reflux disease)    Hiatal hernia    History of alcohol abuse    per pt none since 10/ 2014   History of attempted suicide    History of drug abuse in remission (HCC)    per pt in remission since 12-26-2012  polysubstance dependence (alcohol, crack, cocaine, 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   History of hepatitis C per pt first dx 2009 (approx)   consulted w/ dr comer (infectious disease) note 04-26-2016 , pt states was called and told per lab work no longer has hepatitis c    History of kidney stones    Hypercalcemia    mild and long-standing--- pt asymptomatic   Hyperlipidemia    Nausea and vomiting 07/04/2011   Primary hyperparathyroidism    endocrinology --  dr von   Pruritic erythematous rash    lower legs   PTSD (post-traumatic stress disorder)    Right ureteral stone    Schizophrenia (HCC)    Substance abuse (HCC)    Vitamin D  deficiency     Current Medications[1]  Review of Systems:    MSK: Patient endorses left thumb pain  Physical Exam:  Vitals:   03/31/24 0850  BP: 132/66  Pulse: 61   Temp: (!) 97.5 F (36.4 C)  TempSrc: Oral  SpO2: 100%  Weight: 134 lb 12.8 oz (61.1 kg)  Height: 5' 4 (1.626 m)   General: Patient is sitting comfortably in the room  Head: Normocephalic, atraumatic  Cardio: Regular rate and rhythm, no murmurs, rubs or gallops Pulmonary: Clear to ausculation bilaterally with no rales, rhonchi, and crackles  Abdomen: Soft, nontender, normal active bowel sounds Extremities: Right upper extremity unremarkable.  Left upper extremity with thumb with limited range of motion secondary to pain.  Positive Finkelstein test.  Negative Tinel's sign.  No signs of joint effusion of left upper extremity   Assessment & Plan:   Assessment & Plan De Quervain's tenosynovitis, left Patient presents with 2-week history of left thumb pain and weakness.  She states it started insidiously.  She describes to be a aching/stabbing pain that is constant.  She also reports associated weakness.  She is no longer able to grasp a cup without dropping it.  She has tried Aleve  for the pain with minimal improvement.  She denies any neck pain.  She denies any exacerbation of pain with neck movement.  She denies any numbness or tingling.  On my exam today patient has a positive Finkelstein test.  Patient has 2+ pedal pulses.  No obvious joint effusions.  This is likely de Quervain's  tenosynovitis.  Will treat with thumb spica splint and physical therapy.  Encourage patient to continue using NSAIDs.  Plan: - Thumb spica splint - Encouraged NSAIDs - Patient referred to physical therapy - Follow-up in 3 months, if not improved, refer to orthopedic Primary hyperparathyroidism Patient has a past medical history of primary hyperparathyroidism.  Patient is followed by Dr. Mercie of endocrinology.  She is currently on Reclast  for osteoporosis.  Patient has not been able to schedule a parathyroidectomy.  I encouraged her to reach out to her endocrinologist for this.  She agreed to do so.  She has  been declined from, regarding her medical insurance for parathyroidectomy.  She has not been contacted by [facility].  Patient was referred to [facility].  Plan: - Management per endocrinology - Patient encouraged to schedule her parathyroidectomy Tobacco dependence Patient has past medical history of tobacco use disorder.  She endorses that she is still smoking.  Assessed readiness for quitting smoking.  She states she is ready.  Will send in Chantix .  Plan: - Chantix  started - 4 minutes spent on tobacco counseling cessation Nausea and vomiting, unspecified vomiting type Patient has a history of nausea and intermittent vomiting.  She has not had any vomiting recently.  She also endorses that she has been having some nausea.  She is unable to pinpoint if any foods exacerbate this.  She has had scoping in the past with endoscopy and colonoscopy which was found to have mild stenosis.  Patient had dilatation of her esophagus.  Patient has no trouble swallowing at this time.  Her concern is more with nausea.  She does smoke marijuana.  I do wonder if this was related.  I encouraged her to quit smoking marijuana to see if this will help.  I encouraged her to reach back out to her GI doctor.  She agreed.  Plan: - Likely related to marijuana, encouraged her to stop using, she agreed - Patient encouraged to reach back out to her GI doctor - Carafate  refilled - Refill Zofran    Patient discussed with Dr. Francesco Libby Blanch, DO Internal Medicine Resident PGY-3     [1]  Current Outpatient Medications:    aluminum -magnesium  hydroxide-simethicone  (MAALOX) 200-200-20 MG/5ML SUSP, Take 30 mLs by mouth 4 (four) times daily -  before meals and at bedtime., Disp: 355 mL, Rfl: 0   diclofenac  Sodium (VOLTAREN ) 1 % GEL, Apply 4 g topically 4 (four) times daily., Disp: 120 g, Rfl: 0   gabapentin  (NEURONTIN ) 300 MG capsule, Take 1 capsule (300 mg total) by mouth at bedtime for 10 days. (Patient not taking: Reported on  01/18/2024), Disp: 10 capsule, Rfl: 0   hydrOXYzine  (ATARAX ) 25 MG tablet, Take 1 tablet (25 mg total) by mouth 3 (three) times daily as needed., Disp: 90 tablet, Rfl: 3   lidocaine  (XYLOCAINE ) 2 % solution, Use as directed 15 mLs in the mouth or throat as needed for mouth pain., Disp: 100 mL, Rfl: 0   meloxicam  (MOBIC ) 7.5 MG tablet, Meloxicam  7.5, 1-2 p.o. daily, Disp: 60 tablet, Rfl: 1   naproxen  (NAPROSYN ) 500 MG tablet, Take 1 tablet (500 mg total) by mouth 2 (two) times daily with a meal., Disp: 60 tablet, Rfl: 2   ondansetron  (ZOFRAN ) 4 MG tablet, Take 1 tablet (4 mg total) by mouth every 8 (eight) hours as needed for nausea or vomiting., Disp: 30 tablet, Rfl: 0   pantoprazole  (PROTONIX ) 40 MG tablet, Take 1 tablet (40 mg total) by mouth 2 (two)  times daily., Disp: 30 tablet, Rfl: 1   pantoprazole  (PROTONIX ) 40 MG tablet, Take 1 tablet by mouth once daily, Disp: 30 tablet, Rfl: 0   predniSONE  (DELTASONE ) 5 MG tablet, 12 day taper: 6,6,5,5,4,4,3,3,2,2,1,1, Disp: 42 tablet, Rfl: 0   QUEtiapine  (SEROQUEL ) 25 MG tablet, Take 3 tablets (75 mg total) by mouth at bedtime., Disp: 90 tablet, Rfl: 3   QUEtiapine  (SEROQUEL ) 50 MG tablet, Take 50 mg by mouth at bedtime., Disp: , Rfl:    rosuvastatin  (CRESTOR ) 10 MG tablet, Take 1 tablet (10 mg total) by mouth daily., Disp: 90 tablet, Rfl: 1   sucralfate  (CARAFATE ) 1 g tablet, Take 1 tablet (1 g total) by mouth 4 (four) times daily. Take 10 mLs (1 g total) by mouth 4 (four) times daily - with meals and at bedtime., Disp: 120 tablet, Rfl: 0   sucralfate  (CARAFATE ) 1 GM/10ML suspension, TAKE 10 MLS BY MOUTH 4 TIMES DAILY WITH MEALS AND AT BEDTIME, Disp: 420 mL, Rfl: 0  "

## 2024-03-31 NOTE — Assessment & Plan Note (Signed)
 Patient has a past medical history of primary hyperparathyroidism.  Patient is followed by Dr. Mercie of endocrinology.  She is currently on Reclast  for osteoporosis.  Patient has not been able to schedule a parathyroidectomy.  I encouraged her to reach out to her endocrinologist for this.  She agreed to do so.  She has been declined from, regarding her medical insurance for parathyroidectomy.  She has not been contacted by [facility].  Patient was referred to [facility].  Plan: - Management per endocrinology - Patient encouraged to schedule her parathyroidectomy

## 2024-03-31 NOTE — Assessment & Plan Note (Signed)
 Patient has past medical history of tobacco use disorder.  She endorses that she is still smoking.  Assessed readiness for quitting smoking.  She states she is ready.  Will send in Chantix .  Plan: - Chantix  started - 4 minutes spent on tobacco counseling cessation

## 2024-03-31 NOTE — Patient Instructions (Addendum)
 Mary [name], Thank you for allowing me to take part in your care today.  Here are your instructions.  1.  I ordered a splint for you.  You will likely need to wear this   While you work.  I also ordered you for physical therapy.  Continue taking NSAIDs such as Aleve  and naproxen .  2.  Regarding your nausea and vomiting, please stop smoking marijuana.  Please go back to your stomach doctor.  3.  Please return in 3 months.  We can assess you improvement   PLEASE BRING YOUR MEDICATIONS TO EVERY APPOINTMENT  Thank you, Dr. Tobie  If you have any other questions please contact the internal medicine clinic at [phone] If it is after hours, please call the [facility] at [phone] and then ask the person who picks up for the resident on call.

## 2024-04-08 ENCOUNTER — Telehealth: Payer: Self-pay | Admitting: Physical Therapy

## 2024-04-08 ENCOUNTER — Ambulatory Visit: Payer: MEDICAID | Attending: Internal Medicine | Admitting: Physical Therapy

## 2024-04-08 DIAGNOSIS — M65949 Unspecified synovitis and tenosynovitis, unspecified hand: Secondary | ICD-10-CM

## 2024-04-08 NOTE — Telephone Encounter (Signed)
 Dr. Lovie,  Thank you for the PT referral from you and Libby Blanch, DO regarding Ms. Mary [name]'s tenosynovitis. However, I believe her needs can be better addressed by our occupational therapy team here at [facility].  If you agree, please place an order in [facility] workque in [facility] or fax the order to [phone] for Occupational Therapy.  Thank you,  Waddell Southgate, PT, DPT, [facility] [address] [place], [place]  [address] Phone:  [phone] Fax:  [phone]

## 2024-04-08 NOTE — Therapy (Incomplete)
 " OUTPATIENT PHYSICAL THERAPY NEURO EVALUATION   Patient Name: Mary [name] MRN: [id number] DOB:1965/11/14, 59 y.o., female Today's Date: 04/08/2024   PCP: Tobie Gaines, DO REFERRING PROVIDER: Lovie Clarity, MD  END OF SESSION:   Past Medical History:  Diagnosis Date   Bipolar depression (HCC)    Borderline personality disorder (HCC)    COPD (chronic obstructive pulmonary disease) (HCC)    Depression    GERD (gastroesophageal reflux disease)    Hiatal hernia    History of alcohol abuse    per pt none since 10/ 2014   History of attempted suicide    History of drug abuse in remission (HCC)    per pt in remission since 12-26-2012  polysubstance dependence (alcohol, crack, cocaine, 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   History of hepatitis C per pt first dx 2009 (approx)   consulted w/ dr comer (infectious disease) note 04-26-2016 , pt states was called and told per lab work no longer has hepatitis c    History of kidney stones    Hypercalcemia    mild and long-standing--- pt asymptomatic   Hyperlipidemia    Nausea and vomiting 07/04/2011   Primary hyperparathyroidism    endocrinology --  dr von   Pruritic erythematous rash    lower legs   PTSD (post-traumatic stress disorder)    Right ureteral stone    Schizophrenia (HCC)    Substance abuse (HCC)    Vitamin D  deficiency    Past Surgical History:  Procedure Laterality Date   COLONOSCOPY  12/08/2022   at [facility] with Veena Nandigam at same time as egd   CYSTOSCOPY N/A 01/10/2017   Procedure: CYSTOSCOPY;  Surgeon: Lorence Ozell CROME, MD;  Location: [facility];  Service: Gynecology;  Laterality: N/A;   CYSTOSCOPY W/ URETERAL STENT PLACEMENT Right 07/14/2016   Procedure: CYSTOSCOPY WITH STENT REPLACEMENT;  Surgeon: Sherrilee Belvie CROME, MD;  Location: [facility];  Service: Urology;  Laterality: Right;   CYSTOSCOPY WITH RETROGRADE PYELOGRAM, URETEROSCOPY AND STENT PLACEMENT Right 06/28/2016   Procedure:  CYSTOSCOPY WITH RETROGRADE PYELOGRAM, URETEROSCOPY AND STENT PLACEMENT;  Surgeon: Belvie CROME Sherrilee, MD;  Location: [facility];  Service: Urology;  Laterality: Right;   CYSTOSCOPY/RETROGRADE/URETEROSCOPY/STONE EXTRACTION WITH BASKET Right 07/14/2016   Procedure: CYSTOSCOPY/RETROGRADE/URETEROSCOPY/STONE EXTRACTION WITH BASKET;  Surgeon: Sherrilee Belvie CROME, MD;  Location: [facility];  Service: Urology;  Laterality: Right;   DIAGNOSTIC LAPAROSCOPIC LIVER BIOPSY N/A 02/10/2013   Procedure: DIAGNOSTIC LAPAROSCOPIC ;  Surgeon: Elspeth KYM Schultze, MD;  Location: [facility];  Service: General;  Laterality: N/A;  DIAGNOSTIC LAPAROSCOPY,laparoscopic ventral hernia repair with mesh lysis of adhesions   HERNIA REPAIR     HOLMIUM LASER APPLICATION Right 07/14/2016   Procedure: HOLMIUM LASER APPLICATION;  Surgeon: Sherrilee Belvie CROME, MD;  Location: [facility];  Service: Urology;  Laterality: Right;   SPLENECTOMY  2004   TUBAL LIGATION     UPPER GASTROINTESTINAL ENDOSCOPY  12/08/2022   with colonoscopy at [facility] , Veena Nandigam, Hiatal Hernia, esophageal stricture, dilation,   VAGINAL HYSTERECTOMY N/A 01/10/2017   Procedure: HYSTERECTOMY VAGINAL W/ BILATERAL SALPINGECTOMY WITH REPAIR OF INCIDENTAL CYSTOTOMY;  Surgeon: Lorence Ozell CROME, MD;  Location: [facility];  Service: Gynecology;  Laterality: N/A;   Patient Active Problem List   Diagnosis Date Noted   Osteoporosis 12/07/2023   Flat feet, bilateral 11/30/2023   Chronic low back pain 03/28/2023   Immunization due 03/28/2023   Constipation 03/28/2023   Primary hyperparathyroidism 12/13/2022  Hyperlipidemia 12/13/2022   Tobacco dependence 12/06/2020   Chronic hepatitis C without hepatic coma (HCC) 05/07/2020   Drug abuse and dependence (HCC) 05/07/2020   Bipolar 1 disorder, depressed (HCC) 11/11/2019   Post-traumatic stress disorder, unspecified 07/24/2018   Cannabis dependence, uncomplicated (HCC) 07/24/2018   Bipolar disord, crnt  epsd depress, severe, w psych features (HCC) 07/24/2018   Nicotine  dependence, unspecified, uncomplicated 07/24/2018   Alcohol dependence, uncomplicated (HCC) 07/24/2018   Cocaine dependence in remission (HCC) 07/24/2018   Personality disorder (HCC) 11/16/2017   Reactive airway disease without asthma 04/12/2016   Nausea and vomiting 07/04/2011    ONSET DATE: 03/31/2024 (referral)   REFERRING DIAG: M65.4 (ICD-10-CM) - De Quervain's tenosynovitis, left   THERAPY DIAG:  No diagnosis found.  Rationale for Evaluation and Treatment: Rehabilitation  SUBJECTIVE:                                                                                                                                                                                             SUBJECTIVE STATEMENT: *** Pt accompanied by: {accompnied:27141}  PERTINENT HISTORY: reactive airway disease, chronic hep C, primary hyperparathyroidism, bipolar disorder, alcohol use disorder  PAIN:  Are you having pain? {OPRCPAIN:27236}  PRECAUTIONS: {Therapy precautions:24002}  RED FLAGS: {PT Red Flags:29287}   WEIGHT BEARING RESTRICTIONS: {Yes ***/No:24003}  FALLS: Has patient fallen in last 6 months? {fallsyesno:27318}  LIVING ENVIRONMENT: Lives with: {OPRC lives with:25569::lives with their family} Lives in: {Lives in:25570} Stairs: {opstairs:27293} Has following equipment at home: {Assistive devices:23999}  PLOF: {PLOF:24004}  PATIENT GOALS: ***  OBJECTIVE:  Note: Objective measures were completed at Evaluation unless otherwise noted.  DIAGNOSTIC FINDINGS: ***  COGNITION: Overall cognitive status: {cognition:24006}   SENSATION: {sensation:27233}  COORDINATION: ***  EDEMA:  {edema:24020}  MUSCLE TONE: {LE tone:25568}  MUSCLE LENGTH: Hamstrings: Right *** deg; Left *** deg Debby test: Right *** deg; Left *** deg  DTRs:  {DTR SITE:24025}  POSTURE: {posture:25561}  LOWER EXTREMITY ROM:      {AROM/PROM:27142}  Right Eval Left Eval  Hip flexion    Hip extension    Hip abduction    Hip adduction    Hip internal rotation    Hip external rotation    Knee flexion    Knee extension    Ankle dorsiflexion    Ankle plantarflexion    Ankle inversion    Ankle eversion     (Blank rows = not tested)  LOWER EXTREMITY MMT:    MMT Right Eval Left Eval  Hip flexion    Hip extension    Hip abduction    Hip adduction    Hip internal rotation  Hip external rotation    Knee flexion    Knee extension    Ankle dorsiflexion    Ankle plantarflexion    Ankle inversion    Ankle eversion    (Blank rows = not tested)  BED MOBILITY:  {bed mobility:32615:p}  TRANSFERS: {transfers eval:32620}  RAMP:  {ramp eval:32616}  CURB:  {curb eval:32617}  STAIRS: {stairs eval:32618} GAIT: Findings: {GaitneuroPT:32644::Distance walked: ***,Comments: ***}  FUNCTIONAL TESTS:  {Functional tests:24029}  PATIENT SURVEYS:  {rehab surveys:24030}                                                                                                                              TREATMENT DATE: ***    PATIENT EDUCATION: Education details: *** Person educated: {Person educated:25204} Education method: {Education Method:25205} Education comprehension: {Education Comprehension:25206}  HOME EXERCISE PROGRAM: ***  GOALS: Goals reviewed with patient? Yes  SHORT TERM GOALS: Target date: ***  *** Baseline: Goal status: INITIAL  2.  *** Baseline:  Goal status: INITIAL  3.  *** Baseline:  Goal status: INITIAL  4.  *** Baseline:  Goal status: INITIAL  5.  *** Baseline:  Goal status: INITIAL  6.  *** Baseline:  Goal status: INITIAL  LONG TERM GOALS: Target date: ***  *** Baseline:  Goal status: INITIAL  2.  *** Baseline:  Goal status: INITIAL  3.  *** Baseline:  Goal status: INITIAL  4.  *** Baseline:  Goal status: INITIAL  5.  *** Baseline:  Goal  status: INITIAL  6.  *** Baseline:  Goal status: INITIAL  ASSESSMENT:  CLINICAL IMPRESSION: Patient is a 59 year old female referred to [facility] for De Quervain's tenosynovitis. Pt's PMH is significant for: reactive airway disease, chronic hep C, primary hyperparathyroidism, bipolar disorder, alcohol use disorder. The following deficits were present during the exam: ***. Based on ***, pt is an incr risk for falls. Pt would benefit from skilled PT to address these impairments and functional limitations to maximize functional mobility independence   OBJECTIVE IMPAIRMENTS: {opptimpairments:25111}.   ACTIVITY LIMITATIONS: {activitylimitations:27494}  PARTICIPATION LIMITATIONS: {participationrestrictions:25113}  PERSONAL FACTORS: {Personal factors:25162} are also affecting patient's functional outcome.   REHAB POTENTIAL: {rehabpotential:25112}  CLINICAL DECISION MAKING: {clinical decision making:25114}  EVALUATION COMPLEXITY: {Evaluation complexity:25115}  PLAN:  PT FREQUENCY: {rehab frequency:25116}  PT DURATION: {rehab duration:25117}  PLANNED INTERVENTIONS: {rehab planned interventions:25118::97110-Therapeutic exercises,97530- Therapeutic [phone]- Neuromuscular re-education,97535- Self Rjmz,02859- Manual therapy,Patient/Family education}  PLAN FOR NEXT SESSION: ***   [place] Nishi E Marlon Suleiman, PT, DPT 04/08/2024, 10:54 AM        "

## 2024-04-17 ENCOUNTER — Other Ambulatory Visit: Payer: Self-pay | Admitting: Endocrinology

## 2024-04-17 DIAGNOSIS — K219 Gastro-esophageal reflux disease without esophagitis: Secondary | ICD-10-CM

## 2024-04-21 ENCOUNTER — Ambulatory Visit: Payer: MEDICAID | Admitting: Occupational Therapy

## 2024-05-28 ENCOUNTER — Ambulatory Visit: Payer: MEDICAID | Admitting: Endocrinology

## 2024-06-30 ENCOUNTER — Ambulatory Visit: Payer: Self-pay | Admitting: Student
